# Patient Record
Sex: Male | Born: 1940 | Race: White | Hispanic: No | Marital: Married | Smoking: Former smoker
Health system: Southern US, Community
[De-identification: ages and names within clinical notes are randomized; demographics above are authoritative.]

## PROBLEM LIST (undated history)

## (undated) DIAGNOSIS — F329 Major depressive disorder, single episode, unspecified: Secondary | ICD-10-CM

## (undated) DIAGNOSIS — K5909 Other constipation: Secondary | ICD-10-CM

## (undated) DIAGNOSIS — F32A Depression, unspecified: Secondary | ICD-10-CM

## (undated) DIAGNOSIS — I10 Essential (primary) hypertension: Secondary | ICD-10-CM

## (undated) DIAGNOSIS — E119 Type 2 diabetes mellitus without complications: Secondary | ICD-10-CM

## (undated) DIAGNOSIS — G629 Polyneuropathy, unspecified: Secondary | ICD-10-CM

## (undated) DIAGNOSIS — M5090 Cervical disc disorder, unspecified, unspecified cervical region: Secondary | ICD-10-CM

## (undated) DIAGNOSIS — J869 Pyothorax without fistula: Secondary | ICD-10-CM

## (undated) DIAGNOSIS — E785 Hyperlipidemia, unspecified: Secondary | ICD-10-CM

## (undated) DIAGNOSIS — K269 Duodenal ulcer, unspecified as acute or chronic, without hemorrhage or perforation: Secondary | ICD-10-CM

## (undated) HISTORY — PX: TONSILLECTOMY: SHX5217

## (undated) HISTORY — PX: HERNIA REPAIR: SHX51

## (undated) HISTORY — DX: Duodenal ulcer, unspecified as acute or chronic, without hemorrhage or perforation: K26.9

## (undated) HISTORY — PX: SKIN LESION EXCISION: SHX2412

## (undated) HISTORY — DX: Other constipation: K59.09

## (undated) HISTORY — DX: Hyperlipidemia, unspecified: E78.5

## (undated) HISTORY — DX: Polyneuropathy, unspecified: G62.9

## (undated) HISTORY — DX: Cervical disc disorder, unspecified, unspecified cervical region: M50.90

## (undated) HISTORY — DX: Depression, unspecified: F32.A

## (undated) HISTORY — DX: Essential (primary) hypertension: I10

## (undated) HISTORY — DX: Major depressive disorder, single episode, unspecified: F32.9

## (undated) HISTORY — DX: Pyothorax without fistula: J86.9

---

## 2001-05-02 HISTORY — PX: COLONOSCOPY: SHX174

## 2001-11-15 ENCOUNTER — Encounter (HOSPITAL_COMMUNITY): Admission: RE | Admit: 2001-11-15 | Discharge: 2001-12-15 | Payer: Self-pay | Admitting: Rheumatology

## 2001-12-20 ENCOUNTER — Encounter (HOSPITAL_COMMUNITY): Admission: RE | Admit: 2001-12-20 | Discharge: 2002-01-19 | Payer: Self-pay | Admitting: Rheumatology

## 2002-03-18 ENCOUNTER — Ambulatory Visit (HOSPITAL_COMMUNITY): Admission: RE | Admit: 2002-03-18 | Discharge: 2002-03-18 | Payer: Self-pay | Admitting: Internal Medicine

## 2002-04-11 ENCOUNTER — Encounter (HOSPITAL_COMMUNITY): Admission: RE | Admit: 2002-04-11 | Discharge: 2002-05-11 | Payer: Self-pay | Admitting: Rheumatology

## 2002-04-11 ENCOUNTER — Encounter: Payer: Self-pay | Admitting: Rheumatology

## 2002-08-01 ENCOUNTER — Encounter (HOSPITAL_COMMUNITY): Admission: RE | Admit: 2002-08-01 | Discharge: 2002-08-31 | Payer: Self-pay | Admitting: Rheumatology

## 2002-11-21 ENCOUNTER — Encounter (HOSPITAL_COMMUNITY): Admission: RE | Admit: 2002-11-21 | Discharge: 2002-12-21 | Payer: Self-pay | Admitting: Rheumatology

## 2002-11-25 ENCOUNTER — Encounter: Payer: Self-pay | Admitting: Rheumatology

## 2003-02-13 ENCOUNTER — Encounter (HOSPITAL_COMMUNITY): Admission: RE | Admit: 2003-02-13 | Discharge: 2003-03-15 | Payer: Self-pay | Admitting: Rheumatology

## 2005-04-04 ENCOUNTER — Ambulatory Visit (HOSPITAL_COMMUNITY): Admission: RE | Admit: 2005-04-04 | Discharge: 2005-04-04 | Payer: Self-pay | Admitting: Ophthalmology

## 2012-05-07 ENCOUNTER — Telehealth: Payer: Self-pay

## 2012-05-07 NOTE — Telephone Encounter (Signed)
Pt had a diagnostic colonoscopy 03/18/2002 by RMR and he recommended the next one in 5-7 years. Pt is scheduled for OV with AS at 2:30 Pm on 05/21/2012.

## 2012-05-07 NOTE — Telephone Encounter (Signed)
Pt said he  has some rectal bleeding but he thinks it is coming from medications.

## 2012-05-21 ENCOUNTER — Encounter: Payer: Self-pay | Admitting: Gastroenterology

## 2012-05-21 ENCOUNTER — Ambulatory Visit (INDEPENDENT_AMBULATORY_CARE_PROVIDER_SITE_OTHER): Payer: Medicare Other | Admitting: Gastroenterology

## 2012-05-21 VITALS — BP 128/73 | HR 65 | Temp 97.8°F | Ht 72.0 in | Wt 309.4 lb

## 2012-05-21 DIAGNOSIS — Z139 Encounter for screening, unspecified: Secondary | ICD-10-CM

## 2012-05-21 MED ORDER — LUBIPROSTONE 24 MCG PO CAPS: 24.0000 ug | ORAL_CAPSULE | Freq: Two times a day (BID) | ORAL | Status: DC

## 2012-05-21 MED ORDER — PEG 3350-KCL-NA BICARB-NACL 420 G PO SOLR: 4000.0000 mL | ORAL | Status: DC

## 2012-05-21 NOTE — Assessment & Plan Note (Signed)
72 year old male due for routine screening colonoscopy with Dr. Jena Gauss. Last TCS in 2003 with anal canal hemorrhoids. Likely scant hematochezia secondary to known hemorrhoids in the setting of chronic constipation. He is currently not having quite as productive bowel movements as he would like and notes straining at times. Has been taking Miralax BID without much improvement, chronic. Chronic narcotics likely exacerbating symptoms.  Trial of Amitiza 24 mcg po daily, increase to BID.  Proceed with TCS with Dr. Jena Gauss in near future: the risks, benefits, and alternatives have been discussed with the patient in detail. The patient states understanding and desires to proceed.

## 2012-05-21 NOTE — Progress Notes (Signed)
Primary Care Physician:  Carylon Perches, MD Primary Gastroenterologist:  Dr. Jena Gauss   Chief Complaint  Patient presents with  . Colonoscopy    HPI:   Mr. Richard Fields is a pleasant 72 year old male who presents today for screening colonoscopy. The last colonoscopy was in 2003 by Dr. Jena Gauss, with only hemorrhoids noted.  Notes chronic constipation; he believes it is secondary to medications. Usually has a BM once per day. However, this is not productive. Takes Miralax twice a day, which helps some but still has straining. No melena. Scant paper hematochezia noted with straining. No rectal discomfort or pain. No abdominal pain. No nausea or vomiting. No lack of appetite. No dysphagia. Sleeps in a hospital bed "to keep from rolling off into the floor". Keeps HOB elevated at least 4-5 inches. Denies any GERD symptoms. Takes Pepcid at night.   Uses cane for gait stability. Was told by Dr. Hilda Lias he needed to get a walker due to falling. Hx of peripheral neuropathy.  Past Medical History  Diagnosis Date  . Chronic constipation   . Diabetes mellitus   . Hypertension   . Hyperlipidemia   . Peripheral neuropathy   . Cervical disc disease     Past Surgical History  Procedure Date  . Tonsillectomy   . Hernia repair   . Colonoscopy 2003    RMR: internal hemorrhoids, otherwise normal    Current Outpatient Prescriptions  Medication Sig Dispense Refill  . amitriptyline (ELAVIL) 100 MG tablet Take 100 mg by mouth at bedtime.       Marland Kitchen aspirin 81 MG tablet Take 81 mg by mouth daily.      Marland Kitchen atorvastatin (LIPITOR) 10 MG tablet Take 10 mg by mouth daily.       Marland Kitchen HYDROcodone-acetaminophen (NORCO) 7.5-325 MG per tablet Take 1 tablet by mouth every 4 (four) hours as needed.       Marland Kitchen lisinopril (PRINIVIL,ZESTRIL) 10 MG tablet Take 10 mg by mouth daily.       Marland Kitchen LORazepam (ATIVAN) 2 MG tablet Take 2 mg by mouth every 6 (six) hours as needed.      . polyethylene glycol powder (GLYCOLAX/MIRALAX) powder Take 17  g by mouth 2 (two) times daily.       . pregabalin (LYRICA) 150 MG capsule Take 150 mg by mouth 2 (two) times daily.        Allergies as of 05/21/2012 - Review Complete 05/21/2012  Allergen Reaction Noted  . Penicillins  05/21/2012  . Tegretol (carbamazepine)  05/21/2012    Family History  Problem Relation Age of Onset  . Colon cancer Paternal Uncle     History   Social History  . Marital Status: Married    Spouse Name: N/A    Number of Children: N/A  . Years of Education: N/A   Occupational History  . [employer]     retired   Social History Main Topics  . Smoking status: Never Smoker   . Smokeless tobacco: Not on file  . Alcohol Use: No  . Drug Use: No  . Sexually Active: Not on file   Other Topics Concern  . Not on file   Social History Narrative  . No narrative on file    Review of Systems: Gen: Denies any fever, chills, fatigue, weight loss, lack of appetite.  CV: Denies chest pain, heart palpitations, peripheral edema, syncope.  Resp: Denies shortness of breath at rest or with exertion. Denies wheezing or cough.  GI: Denies dysphagia  or odynophagia. Denies jaundice, hematemesis, fecal incontinence. GU : Denies urinary burning, urinary frequency, urinary hesitancy MS: +neck pain  Derm: Denies rash, itching, dry skin Psych: hx of depression Heme: Denies bruising, bleeding, and enlarged lymph nodes.  Physical Exam: BP 128/73  Pulse 65  Temp 97.8 F (36.6 C) (Oral)  Ht 6' (1.829 m)  Wt 309 lb 6.4 oz (140.343 kg)  BMI 41.96 kg/m2 General:   Alert and oriented. Pleasant and cooperative. Well-nourished and well-developed.  Head:  Normocephalic and atraumatic. Eyes:  Without icterus, sclera clear and conjunctiva pink.  Ears:  Normal auditory acuity. Nose:  No deformity, discharge,  or lesions. Mouth:  No deformity or lesions, oral mucosa pink.  Neck:  Supple, without mass or thyromegaly. Lungs:  Clear to auscultation bilaterally. No  wheezes, rales, or rhonchi. No distress.  Heart:  S1, S2 present without murmurs appreciated.  Abdomen:  LIMITED EXAM. PT COULD NOT GET ON TABLE BECAUSE HE WAS AFRAID HE WOULD NOT BE ABLE TO GET UP. +BS, soft, non-tender and non-distended. Rectal:  Deferred  Msk:  Symmetrical without gross deformities. Slight shuffle with gait, uses cane. Extremities:  1+ lower extremity edema.  Neurologic:  Alert and  oriented x4;  grossly normal neurologically. Skin:  Intact without significant lesions or rashes. Cervical Nodes:  No significant cervical adenopathy. Psych:  Alert and cooperative. Normal mood and affect.

## 2012-05-21 NOTE — Patient Instructions (Addendum)
For constipation: Stop taking the polyethylene glycol (miralax), and start taking Amitiza once each evening with your meal. Make sure you take it with food to avoid nausea. Take this once a day for about 3 days, then increase to twice a day if you tolerate it well. If you start having diarrhea, decrease the dosage and call our office.  We have set you up for a colonoscopy with Dr. Jena Gauss in the near future.

## 2012-05-22 NOTE — Progress Notes (Signed)
Faxed to PCP

## 2012-05-29 ENCOUNTER — Encounter (HOSPITAL_COMMUNITY): Payer: Self-pay | Admitting: Pharmacy Technician

## 2012-05-30 ENCOUNTER — Ambulatory Visit (HOSPITAL_COMMUNITY)
Admission: RE | Admit: 2012-05-30 | Discharge: 2012-05-30 | Disposition: A | Discharge status: Home / Self Care | Payer: Medicare Other | Source: Ambulatory Visit | Attending: Internal Medicine | Admitting: Internal Medicine

## 2012-05-30 ENCOUNTER — Encounter (HOSPITAL_COMMUNITY)
Admission: RE | Disposition: A | Discharge status: Home / Self Care | Payer: Self-pay | Source: Ambulatory Visit | Attending: Internal Medicine

## 2012-05-30 ENCOUNTER — Encounter (HOSPITAL_COMMUNITY): Payer: Self-pay | Admitting: *Deleted

## 2012-05-30 DIAGNOSIS — Z139 Encounter for screening, unspecified: Secondary | ICD-10-CM

## 2012-05-30 DIAGNOSIS — E119 Type 2 diabetes mellitus without complications: Secondary | ICD-10-CM | POA: Insufficient documentation

## 2012-05-30 DIAGNOSIS — D126 Benign neoplasm of colon, unspecified: Secondary | ICD-10-CM | POA: Insufficient documentation

## 2012-05-30 DIAGNOSIS — Z1211 Encounter for screening for malignant neoplasm of colon: Secondary | ICD-10-CM | POA: Insufficient documentation

## 2012-05-30 DIAGNOSIS — I10 Essential (primary) hypertension: Secondary | ICD-10-CM | POA: Insufficient documentation

## 2012-05-30 DIAGNOSIS — K648 Other hemorrhoids: Secondary | ICD-10-CM

## 2012-05-30 DIAGNOSIS — Z01812 Encounter for preprocedural laboratory examination: Secondary | ICD-10-CM | POA: Insufficient documentation

## 2012-05-30 HISTORY — DX: Type 2 diabetes mellitus without complications: E11.9

## 2012-05-30 HISTORY — PX: COLONOSCOPY: SHX5424

## 2012-05-30 SURGERY — COLONOSCOPY
Anesthesia: Moderate Sedation

## 2012-05-30 MED ORDER — MIDAZOLAM HCL 5 MG/5ML IJ SOLN
INTRAMUSCULAR | Status: AC
  Filled 2012-05-30: qty 10

## 2012-05-30 MED ORDER — MEPERIDINE HCL 100 MG/ML IJ SOLN
INTRAMUSCULAR | Status: AC
  Filled 2012-05-30: qty 2

## 2012-05-30 MED ORDER — MEPERIDINE HCL 100 MG/ML IJ SOLN
INTRAMUSCULAR | Status: DC | PRN
  Administered 2012-05-30: 50 mg via INTRAVENOUS
  Administered 2012-05-30: 25 mg via INTRAVENOUS

## 2012-05-30 MED ORDER — STERILE WATER FOR IRRIGATION IR SOLN
Status: DC | PRN
  Administered 2012-05-30: 12:00:00

## 2012-05-30 MED ORDER — SODIUM CHLORIDE 0.45 % IV SOLN
INTRAVENOUS | Status: DC
  Administered 2012-05-30: 12:00:00 via INTRAVENOUS

## 2012-05-30 MED ORDER — MIDAZOLAM HCL 5 MG/5ML IJ SOLN
INTRAMUSCULAR | Status: DC | PRN
  Administered 2012-05-30 (×2): 1 mg via INTRAVENOUS
  Administered 2012-05-30: 2 mg via INTRAVENOUS
  Administered 2012-05-30: 1 mg via INTRAVENOUS
  Administered 2012-05-30: 2 mg via INTRAVENOUS
  Administered 2012-05-30: 1 mg via INTRAVENOUS

## 2012-05-30 MED ORDER — ONDANSETRON HCL 4 MG/2ML IJ SOLN
INTRAMUSCULAR | Status: DC | PRN
  Administered 2012-05-30: 4 mg via INTRAVENOUS

## 2012-05-30 MED ORDER — ONDANSETRON HCL 4 MG/2ML IJ SOLN
INTRAMUSCULAR | Status: AC
  Filled 2012-05-30: qty 2

## 2012-05-30 NOTE — H&P (View-Only) (Signed)
 Primary Care Physician:  FAGAN,ROY, MD Primary Gastroenterologist:  Dr. Rourk   Chief Complaint  Patient presents with  . Colonoscopy    HPI:   Richard Fields is a pleasant 72-year-old male who presents today for screening colonoscopy. The last colonoscopy was in 2003 by Dr. Rourk, with only hemorrhoids noted.  Notes chronic constipation; he believes it is secondary to medications. Usually has a BM once per day. However, this is not productive. Takes Miralax twice a day, which helps some but still has straining. No melena. Scant paper hematochezia noted with straining. No rectal discomfort or pain. No abdominal pain. No nausea or vomiting. No lack of appetite. No dysphagia. Sleeps in a hospital bed "to keep from rolling off into the floor". Keeps HOB elevated at least 4-5 inches. Denies any GERD symptoms. Takes Pepcid at night.   Uses cane for gait stability. Was told by Dr. Keeling he needed to get a walker due to falling. Hx of peripheral neuropathy.  Past Medical History  Diagnosis Date  . Chronic constipation   . Diabetes mellitus   . Hypertension   . Hyperlipidemia   . Peripheral neuropathy   . Cervical disc disease     Past Surgical History  Procedure Date  . Tonsillectomy   . Hernia repair   . Colonoscopy 2003    RMR: internal hemorrhoids, otherwise normal    Current Outpatient Prescriptions  Medication Sig Dispense Refill  . amitriptyline (ELAVIL) 100 MG tablet Take 100 mg by mouth at bedtime.       . aspirin 81 MG tablet Take 81 mg by mouth daily.      . atorvastatin (LIPITOR) 10 MG tablet Take 10 mg by mouth daily.       . HYDROcodone-acetaminophen (NORCO) 7.5-325 MG per tablet Take 1 tablet by mouth every 4 (four) hours as needed.       . lisinopril (PRINIVIL,ZESTRIL) 10 MG tablet Take 10 mg by mouth daily.       . LORazepam (ATIVAN) 2 MG tablet Take 2 mg by mouth every 6 (six) hours as needed.      . polyethylene glycol powder (GLYCOLAX/MIRALAX) powder Take 17  g by mouth 2 (two) times daily.       . pregabalin (LYRICA) 150 MG capsule Take 150 mg by mouth 2 (two) times daily.        Allergies as of 05/21/2012 - Review Complete 05/21/2012  Allergen Reaction Noted  . Penicillins  05/21/2012  . Tegretol (carbamazepine)  05/21/2012    Family History  Problem Relation Age of Onset  . Colon cancer Paternal Uncle     History   Social History  . Marital Status: Married    Spouse Name: N/A    Number of Children: N/A  . Years of Education: N/A   Occupational History  . [employer]     retired   Social History Main Topics  . Smoking status: Never Smoker   . Smokeless tobacco: Not on file  . Alcohol Use: No  . Drug Use: No  . Sexually Active: Not on file   Other Topics Concern  . Not on file   Social History Narrative  . No narrative on file    Review of Systems: Gen: Denies any fever, chills, fatigue, weight loss, lack of appetite.  CV: Denies chest pain, heart palpitations, peripheral edema, syncope.  Resp: Denies shortness of breath at rest or with exertion. Denies wheezing or cough.  GI: Denies dysphagia   or odynophagia. Denies jaundice, hematemesis, fecal incontinence. GU : Denies urinary burning, urinary frequency, urinary hesitancy MS: +neck pain  Derm: Denies rash, itching, dry skin Psych: hx of depression Heme: Denies bruising, bleeding, and enlarged lymph nodes.  Physical Exam: BP 128/73  Pulse 65  Temp 97.8 F (36.6 C) (Oral)  Ht 6' (1.829 m)  Wt 309 lb 6.4 oz (140.343 kg)  BMI 41.96 kg/m2 General:   Alert and oriented. Pleasant and cooperative. Well-nourished and well-developed.  Head:  Normocephalic and atraumatic. Eyes:  Without icterus, sclera clear and conjunctiva pink.  Ears:  Normal auditory acuity. Nose:  No deformity, discharge,  or lesions. Mouth:  No deformity or lesions, oral mucosa pink.  Neck:  Supple, without mass or thyromegaly. Lungs:  Clear to auscultation bilaterally. No  wheezes, rales, or rhonchi. No distress.  Heart:  S1, S2 present without murmurs appreciated.  Abdomen:  LIMITED EXAM. PT COULD NOT GET ON TABLE BECAUSE HE WAS AFRAID HE WOULD NOT BE ABLE TO GET UP. +BS, soft, non-tender and non-distended. Rectal:  Deferred  Msk:  Symmetrical without gross deformities. Slight shuffle with gait, uses cane. Extremities:  1+ lower extremity edema.  Neurologic:  Alert and  oriented x4;  grossly normal neurologically. Skin:  Intact without significant lesions or rashes. Cervical Nodes:  No significant cervical adenopathy. Psych:  Alert and cooperative. Normal mood and affect.    

## 2012-05-30 NOTE — Op Note (Signed)
[facility] [address] [place], [address]   COLONOSCOPY PROCEDURE REPORT  PATIENT: Richard Fields, Richard Fields  MR#:         [id number] BIRTHDATE: 10-Jan-1941 , 71  yrs. old GENDER: Male ENDOSCOPIST: R.  Roetta Sessions, MD FACP FACG REFERRED BY:  Carylon Perches, M.D. PROCEDURE DATE:  05/30/2012 PROCEDURE:    Colonoscopy with snare polypectomy   INDICATIONS: Average risk colorectal screening examination.  INFORMED CONSENT:  The risks, benefits, alternatives and imponderables including but not limited to bleeding, perforation as well as the possibility of a missed lesion have been reviewed.  The potential for biopsy, lesion removal, etc. have also been discussed.  Questions have been answered.  All parties agreeable. Please see the history and physical in the medical record for more information.  MEDICATIONS: Versed 8 mg IV and Demerol 75 mg IV in divided doses. Zofran 4 mg  DESCRIPTION OF PROCEDURE:  After a digital rectal exam was performed, the Pentax Colonoscope [id number]  colonoscope was advanced from the anus through the rectum and colon to the area of the cecum, ileocecal valve and appendiceal orifice.  The cecum was deeply intubated.  These structures were well-seen and photographed for the record.  From the level of the cecum and ileocecal valve, the scope was slowly and cautiously withdrawn.  The mucosal surfaces were carefully surveyed utilizing scope tip deflection to facilitate fold flattening as needed.  The scope was pulled down into the rectum where a thorough examination was performed.    FINDINGS:  Adequate preparation. Rectal vault small-unable to retroflex but seen well on-face. Rectal mucosa appeared normal aside from internal hemorrhoids and a single anal papilla..  (1) 4 mm polyp in the mid descending segment; otherwise, the remainder of colonic mucosa appeared normal. The patient's colon was redundant requiring a number of maneuvers including  changing of the patient's position and external abdominal pressure to reach the cecum.  THERAPEUTIC / DIAGNOSTIC MANEUVERS PERFORMED:  The above-mentioned polyp was cold snared/removed.  COMPLICATIONS: none  CECAL WITHDRAWAL TIME:  8 minutes  IMPRESSION:  Redundant colon. Single colonic polyp-removed as described above. Internal hemorrhoids.  RECOMMENDATIONS: Followup on pathology.   _______________________________ eSigned:  R. Roetta Sessions, MD FACP [facility] 05/30/2012 1:23 PM   CC:

## 2012-05-30 NOTE — Interval H&P Note (Signed)
History and Physical Interval Note:  05/30/2012 12:18 PM  Richard Fields  has presented today for surgery, with the diagnosis of SCREENING COLONOSCOPY  The various methods of treatment have been discussed with the patient and family. After consideration of risks, benefits and other options for treatment, the patient has consented to  Procedure(s) (LRB) with comments: COLONOSCOPY (N/A) - 12:00 as a surgical intervention .  The patient's history has been reviewed, patient examined, no change in status, stable for surgery.  I have reviewed the patient's chart and labs.  Questions were answered to the patient's satisfaction.     Eula Listen  As above.The risks, benefits, limitations, alternatives and imponderables have been reviewed with the patient. Questions have been answered. All parties are agreeable.

## 2012-06-01 ENCOUNTER — Encounter (HOSPITAL_COMMUNITY): Payer: Self-pay | Admitting: Internal Medicine

## 2012-06-04 ENCOUNTER — Encounter: Payer: Self-pay | Admitting: Internal Medicine

## 2012-06-04 ENCOUNTER — Encounter: Payer: Self-pay | Admitting: *Deleted

## 2013-03-11 ENCOUNTER — Other Ambulatory Visit (HOSPITAL_COMMUNITY): Payer: Self-pay | Admitting: Internal Medicine

## 2013-03-11 DIAGNOSIS — R479 Unspecified speech disturbances: Secondary | ICD-10-CM

## 2013-03-11 DIAGNOSIS — W19XXXA Unspecified fall, initial encounter: Secondary | ICD-10-CM

## 2013-03-12 ENCOUNTER — Ambulatory Visit (HOSPITAL_COMMUNITY)
Admission: RE | Admit: 2013-03-12 | Discharge: 2013-03-12 | Disposition: A | Discharge status: Home / Self Care | Payer: PRIVATE HEALTH INSURANCE | Source: Ambulatory Visit | Attending: Internal Medicine | Admitting: Internal Medicine

## 2013-03-12 DIAGNOSIS — R4789 Other speech disturbances: Secondary | ICD-10-CM | POA: Insufficient documentation

## 2013-03-12 DIAGNOSIS — I6789 Other cerebrovascular disease: Secondary | ICD-10-CM | POA: Insufficient documentation

## 2013-03-12 DIAGNOSIS — W19XXXA Unspecified fall, initial encounter: Secondary | ICD-10-CM

## 2013-03-12 DIAGNOSIS — R262 Difficulty in walking, not elsewhere classified: Secondary | ICD-10-CM | POA: Insufficient documentation

## 2013-03-12 DIAGNOSIS — G319 Degenerative disease of nervous system, unspecified: Secondary | ICD-10-CM | POA: Insufficient documentation

## 2013-03-12 DIAGNOSIS — R479 Unspecified speech disturbances: Secondary | ICD-10-CM

## 2013-03-12 DIAGNOSIS — R51 Headache: Secondary | ICD-10-CM | POA: Insufficient documentation

## 2013-09-10 ENCOUNTER — Ambulatory Visit (INDEPENDENT_AMBULATORY_CARE_PROVIDER_SITE_OTHER): Payer: Medicare Other | Admitting: Neurology

## 2013-09-10 ENCOUNTER — Encounter (INDEPENDENT_AMBULATORY_CARE_PROVIDER_SITE_OTHER): Payer: Self-pay

## 2013-09-10 ENCOUNTER — Encounter: Payer: Self-pay | Admitting: Neurology

## 2013-09-10 VITALS — BP 111/73 | HR 75 | Temp 97.7°F | Ht 72.0 in

## 2013-09-10 DIAGNOSIS — G609 Hereditary and idiopathic neuropathy, unspecified: Secondary | ICD-10-CM

## 2013-09-10 DIAGNOSIS — R269 Unspecified abnormalities of gait and mobility: Secondary | ICD-10-CM

## 2013-09-10 DIAGNOSIS — Z9181 History of falling: Secondary | ICD-10-CM

## 2013-09-10 DIAGNOSIS — E669 Obesity, unspecified: Secondary | ICD-10-CM

## 2013-09-10 DIAGNOSIS — R296 Repeated falls: Secondary | ICD-10-CM

## 2013-09-10 DIAGNOSIS — R2689 Other abnormalities of gait and mobility: Secondary | ICD-10-CM

## 2013-09-10 NOTE — Patient Instructions (Addendum)
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   Remember to drink plenty of fluid, eat healthy meals and do not skip any meals. Try to eat protein with a every meal and eat a healthy snack such as fruit or nuts in between meals. Change positions slowly and you should start using a cane.   As far as your medications are concerned, I would like to suggest no new medications, in fact, you may want to talk to Dr. Willey Blade about reducing some of your medications. He can consider physical therapy, once your cast can come off. You may do better with a 2 wheeled walker.   Unfortunately, there is probably not a whole lot I can do from the neurological standpoint.

## 2013-09-10 NOTE — Progress Notes (Signed)
Subjective:    Patient ID: Richard Fields is a 73 y.o. male.  HPI    Star Age, MD, PhD [facility] [address].O[address] [place], [place] [address]  Dear Dr. Willey Blade,   I saw your patient, Richard Fields, upon your kind request in my neurologic clinic today for initial consultation of his balance problems and history of falls. The patient is accompanied by his wife today. As you know, Richard Fields is a 73 year old right-handed gentleman with an underlying medical history of obesity, arthritis, affecting both knees, cervical disc disease for which he is on as needed use of hydrocodone, diabetes (not on meds, last A1c of 6.6), hyperlipidemia, hypertension, history of neuropathy for which he is on Lyrica 150 mg twice daily and amitriptyline 100 mg each night, and insomnia, for which he is on Ativan 2 mg each night, who reports balance issues and recurrent falls for years, worse in the last 2-3 months. He had a brain MRI without contrast on 03/12/2013: Moderate atrophy with mild to moderate chronic microvascular ischemic change. No acute stroke or visible hemorrhage. 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 No significant compression of the underlying brain. In addition, personally reviewed the images through the PACS system. He does not have a history of heavy alcohol use and quit smoking in the 80s. He has had concussions before, playing football when he was young.  He has been using a walker for years, but fell recently about 2 weeks ago and sustained a fracture of his right 5th metacarpal. He has not had a syncope or vertigo symptoms. He is currently in a WC. He has a cast on his R hand/wrist. He does not snore and has no apneas per wife.  He was seen for his neuropathy in our office years ago, presumably by Dr. Brett Fairy.   His Past Medical History Is Significant For: Past  Medical History  Diagnosis Date  . Chronic constipation   . Hypertension   . Hyperlipidemia   . Peripheral neuropathy   . Cervical disc disease   . Diabetes mellitus without complication     Borderline diabetic-diet controlled    His Past Surgical History Is Significant For: Past Surgical History  Procedure Laterality Date  . Tonsillectomy    . Hernia repair    . Colonoscopy  2003    RMR: internal hemorrhoids, otherwise normal  . Colonoscopy  05/30/2012    Procedure: COLONOSCOPY;  Surgeon: Daneil Dolin, MD;  [facility];  Service: Endoscopy;  Laterality: N/A;  12:00    His Family History Is Significant For: Family History  Problem Relation Age of Onset  . Colon cancer Paternal Uncle   . Diabetes Brother   . Cancer Father     His Social History Is Significant For: History   Social History  . Marital Status: Married    Spouse Name: N/A    Number of Children: N/A  . Years of Education: N/A   Occupational History  . [employer]     retired   Social History Main Topics  . Smoking status: Never Smoker   . Smokeless tobacco: None  . Alcohol Use: No  . Drug Use: No  . Sexual Activity: None   Other Topics Concern  . None   Social History Narrative  . None    His Allergies Are:  Allergies  Allergen Reactions  . Penicillins   .  Tegretol [Carbamazepine]   :   His Current Medications Are:  Outpatient Encounter Prescriptions as of 09/10/2013  Medication Sig  . AMITIZA 24 MCG capsule Take 2 capsules by mouth daily.  Marland Kitchen amitriptyline (ELAVIL) 100 MG tablet Take 100 mg by mouth at bedtime.   Marland Kitchen aspirin 325 MG tablet Take 325 mg by mouth daily.  Marland Kitchen atorvastatin (LIPITOR) 10 MG tablet Take 10 mg by mouth daily.   . clobetasol ointment (TEMOVATE) [id number] % Apply 1 application topically as needed.  . fluticasone (CUTIVATE) 0.05 % cream Apply 1 application topically as needed.  . furosemide (LASIX) 40 MG tablet Take 1 tablet by mouth daily.  Marland Kitchen  HYDROcodone-acetaminophen (NORCO) 7.5-325 MG per tablet Take 1 tablet by mouth every 4 (four) hours as needed. Pain, takes mostly at bedtime  . ketoconazole (NIZORAL) 2 % cream Apply 1 application topically as needed.  Marland Kitchen lisinopril (PRINIVIL,ZESTRIL) 10 MG tablet Take 10 mg by mouth daily.   Marland Kitchen LORazepam (ATIVAN) 2 MG tablet Take 2 mg by mouth at bedtime.  . polyethylene glycol powder (GLYCOLAX/MIRALAX) powder Take 17 g by mouth 2 (two) times daily.   . polyethylene glycol-electrolytes (TRILYTE) 420 G solution Take 4,000 mLs by mouth as directed.  . potassium chloride SA (K-DUR,KLOR-CON) 20 MEQ tablet Take 0.5 tablets by mouth daily.  . pregabalin (LYRICA) 150 MG capsule Take 150 mg by mouth 2 (two) times daily.  :   Review of Systems:  Out of a complete 14 point review of systems, all are reviewed and negative with the exception of these symptoms as listed below:  Review of Systems  Constitutional: Positive for fatigue.  Eyes: Negative.   Respiratory: Negative.   Cardiovascular: Positive for leg swelling.  Gastrointestinal: Positive for constipation.  Endocrine: Positive for polydipsia.  Genitourinary: Negative.   Musculoskeletal: Positive for gait problem.  Skin: Negative.   Allergic/Immunologic: Negative.   Neurological: Positive for headaches.  Hematological: Bruises/bleeds easily.  Psychiatric/Behavioral: Negative.     Objective:  Neurologic Exam  Physical Exam Physical Examination:   Filed Vitals:   09/10/13 1331  BP: 111/73  Pulse: 75  Temp: 97.7 F (36.5 C)    General Examination: The patient is a very pleasant 73 y.o. male in no acute distress. He appears frail and mildly deconditioned and adequately groomed. He is obese and situated in his wheelchair. He brought in his 2 wheeled walker which has a pistol-grip on the right to help him hold onto with his fist on the R.   HEENT: Normocephalic, atraumatic, pupils are equal, round and reactive to light and  accommodation. Extraocular tracking is good without limitation to gaze excursion or nystagmus noted. Normal smooth pursuit is noted. Hearing is grossly intact. Face is symmetric with normal facial animation and normal facial sensation. Speech is clear with no dysarthria noted. There is no hypophonia. There is no lip, neck/head, jaw or voice tremor. Neck is supple with full range of passive and active motion. There are no carotid bruits on auscultation. Oropharynx exam reveals: severe mouth dryness, poor dental hygiene and several missing teeth and mild airway crowding. Mallampati is class I. Tongue protrudes centrally and palate elevates symmetrically. Tonsils are absent.   Chest: Clear to auscultation without wheezing, rhonchi or crackles noted.  Heart: S1+S2+0, regular and normal without murmurs, rubs or gallops noted.   Abdomen: Soft, non-tender and non-distended with normal bowel sounds appreciated on auscultation.  Extremities: There is 2+ pitting edema in the distal lower extremities bilaterally. Pedal pulses are  intact.  Skin: Warm and dry without trophic changes noted. There are no varicose veins. Chronic venous stasis like changes and discoloration is seen in the distal LEs.   Musculoskeletal: exam reveals no obvious joint deformities, tenderness or joint swelling or erythema.   Neurologically:  Mental status: The patient is awake, alert and oriented in all 4 spheres. His immediate and remote memory, attention, language skills and fund of knowledge are appropriate. There is no evidence of aphasia, agnosia, apraxia or anomia. Speech is clear with normal prosody and enunciation. Thought process is linear. Mood is normal and affect is normal.  Cranial nerves II - XII are as described above under HEENT exam. In addition: shoulder shrug is normal with equal shoulder height noted. Motor exam: Normal bulk, strength and tone is noted. There is no drift, tremor or rebound. Romberg is negative.  Reflexes are 2+ throughout. Fine motor skills and coordination: intact with normal finger taps, normal hand movements, normal rapid alternating patting, normal foot taps and normal foot agility.  Cerebellar testing: No dysmetria or intention tremor on finger to nose testing. Heel to shin is difficult for him, as he has trouble reaching his knees with his heels b/l. There is no truncal gait ataxia.  Sensory exam: intact to light touch, pinprick, vibration, temperature sense in the upper extremities and decreased to all modalities in the LEs up to below knees.   Gait, station and balance: He stands with difficulty and needs to push himself up and needs mild assistance.  No veering to one side is noted. No leaning to one side is noted. Posture is age-appropriate but stance is wide-based. Gait shows slow and cautious gait and he is not pushing his knees all the way through. He uses his 2 wheeled walker with grip fairly well. He turns in 3 steps. Tandem walk is not possible and toe and heel stance is not possible.                Assessment and Plan:   In summary, Richard Fields is a very pleasant 73 y.o.-year old male with an underlying medical history of obesity, arthritis, affecting both knees, cervical disc disease for which he is on as needed use of hydrocodone, diabetes (not on meds, last A1c of 6.6), hyperlipidemia, hypertension, history of neuropathy for which he is on Lyrica 150 mg twice daily and amitriptyline 100 mg each night, and insomnia, for which he is on Ativan 2 mg each night, who reports balance issues and recurrent falls for years, worse in the last 2-3 months. 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 I explained my findings and my concerns to him. I would recommend consideration of physical therapy for gait and balance training once he is able to get his cast removed. He is advised to discuss with you streamlining his medications and perhaps reducing some of his sedating medications. He is advised to strive for weight loss. Unfortunately, overall, from a neurological standpoint there is probably not much I can do for him because of the complex nature of his gait disorder and I don't believe that just one underlying problem and a primary neurological problem is the issue at this  time. He is advised to change positions slowly and keep well hydrated. His mouth is quite dry. This may be a function of not drinking enough fluids but also side effects from primarily the amitriptyline.  I answered all their questions today and the patient and his wife were in agreement with the above outlined plan. I will see him back as needed.  Thank you very much for allowing me to participate in the care of this nice patient. If I can be of any further assistance to you please do not hesitate to call me at [phone].  Sincerely,   Star Age, MD, PhD

## 2014-08-20 ENCOUNTER — Emergency Department (HOSPITAL_COMMUNITY): Payer: Medicare Other

## 2014-08-20 ENCOUNTER — Encounter (HOSPITAL_COMMUNITY): Payer: Self-pay | Admitting: *Deleted

## 2014-08-20 ENCOUNTER — Emergency Department (HOSPITAL_COMMUNITY)
Admission: EM | Admit: 2014-08-20 | Discharge: 2014-08-20 | Disposition: A | Discharge status: Home / Self Care | Payer: Medicare Other | Attending: Emergency Medicine | Admitting: Emergency Medicine

## 2014-08-20 DIAGNOSIS — R531 Weakness: Secondary | ICD-10-CM | POA: Diagnosis not present

## 2014-08-20 DIAGNOSIS — M791 Myalgia: Secondary | ICD-10-CM | POA: Insufficient documentation

## 2014-08-20 DIAGNOSIS — R197 Diarrhea, unspecified: Secondary | ICD-10-CM | POA: Diagnosis not present

## 2014-08-20 DIAGNOSIS — F32A Depression, unspecified: Secondary | ICD-10-CM

## 2014-08-20 DIAGNOSIS — E785 Hyperlipidemia, unspecified: Secondary | ICD-10-CM | POA: Diagnosis not present

## 2014-08-20 DIAGNOSIS — Z7982 Long term (current) use of aspirin: Secondary | ICD-10-CM | POA: Diagnosis not present

## 2014-08-20 DIAGNOSIS — I10 Essential (primary) hypertension: Secondary | ICD-10-CM | POA: Diagnosis not present

## 2014-08-20 DIAGNOSIS — E119 Type 2 diabetes mellitus without complications: Secondary | ICD-10-CM | POA: Diagnosis not present

## 2014-08-20 DIAGNOSIS — F329 Major depressive disorder, single episode, unspecified: Secondary | ICD-10-CM | POA: Diagnosis not present

## 2014-08-20 DIAGNOSIS — K5900 Constipation, unspecified: Secondary | ICD-10-CM | POA: Diagnosis not present

## 2014-08-20 DIAGNOSIS — G629 Polyneuropathy, unspecified: Secondary | ICD-10-CM | POA: Insufficient documentation

## 2014-08-20 DIAGNOSIS — Z880 Allergy status to penicillin: Secondary | ICD-10-CM | POA: Diagnosis not present

## 2014-08-20 LAB — URINALYSIS, ROUTINE W REFLEX MICROSCOPIC
BILIRUBIN URINE: NEGATIVE
Glucose, UA: NEGATIVE mg/dL
HGB URINE DIPSTICK: NEGATIVE
LEUKOCYTES UA: NEGATIVE
NITRITE: NEGATIVE
PH: 6.5 (ref 5.0–8.0)
Protein, ur: NEGATIVE mg/dL
Specific Gravity, Urine: 1.015 (ref 1.005–1.030)
Urobilinogen, UA: 0.2 mg/dL (ref 0.0–1.0)

## 2014-08-20 LAB — BASIC METABOLIC PANEL
Anion gap: 9 (ref 5–15)
BUN: 12 mg/dL (ref 6–23)
CHLORIDE: 100 mmol/L (ref 96–112)
CO2: 30 mmol/L (ref 19–32)
CREATININE: 0.72 mg/dL (ref 0.50–1.35)
Calcium: 8.8 mg/dL (ref 8.4–10.5)
GFR calc Af Amer: >90 mL/min (ref >90)
GFR calc non Af Amer: 89 mL/min — ABNORMAL LOW (ref >90)
Glucose, Bld: 115 mg/dL — ABNORMAL HIGH (ref 70–99)
Potassium: 3 mmol/L — ABNORMAL LOW (ref 3.5–5.1)
Sodium: 139 mmol/L (ref 135–145)

## 2014-08-20 LAB — CBC
HCT: 43.4 % (ref 39.0–52.0)
Hemoglobin: 14.8 g/dL (ref 13.0–17.0)
MCH: 31.7 pg (ref 26.0–34.0)
MCHC: 34.1 g/dL (ref 30.0–36.0)
MCV: 92.9 fL (ref 78.0–100.0)
PLATELETS: 227 10*3/uL (ref 150–400)
RBC: 4.67 MIL/uL (ref 4.22–5.81)
RDW: 12.5 % (ref 11.5–15.5)
WBC: 8.2 10*3/uL (ref 4.0–10.5)

## 2014-08-20 LAB — HEPATIC FUNCTION PANEL
ALK PHOS: 72 U/L (ref 39–117)
ALT: 17 U/L (ref 0–53)
AST: 18 U/L (ref 0–37)
Albumin: 3.7 g/dL (ref 3.5–5.2)
BILIRUBIN INDIRECT: 0.7 mg/dL (ref 0.3–0.9)
Bilirubin, Direct: 0.2 mg/dL (ref 0.0–0.5)
TOTAL PROTEIN: 7.1 g/dL (ref 6.0–8.3)
Total Bilirubin: 0.9 mg/dL (ref 0.3–1.2)

## 2014-08-20 LAB — I-STAT CG4 LACTIC ACID, ED
LACTIC ACID, VENOUS: 1.51 mmol/L (ref 0.5–2.0)
Lactic Acid, Venous: 1.14 mmol/L (ref 0.5–2.0)

## 2014-08-20 LAB — TSH: TSH: 0.262 u[IU]/mL — ABNORMAL LOW (ref 0.350–4.500)

## 2014-08-20 MED ORDER — SODIUM CHLORIDE 0.9 % IV BOLUS (SEPSIS): 1000.0000 mL | Freq: Once | INTRAVENOUS | Status: DC

## 2014-08-20 NOTE — ED Notes (Signed)
[facility] will transport patient home. Awaiting their arrival.

## 2014-08-20 NOTE — ED Notes (Signed)
Pt sates he is unable to provide urine sample at this time.

## 2014-08-20 NOTE — ED Notes (Signed)
Spoke with patient's wife who stated she "did not want to come get patient, because I have the cellulitis". Looking into transport for patient

## 2014-08-20 NOTE — ED Notes (Signed)
Pt brought in by [facility] for c/o feeling weak and not feeling well x 2 weeks; pt has been having diarrhea with no abdominal pain or fever and states he has not been able to sleep last few nights

## 2014-08-20 NOTE — ED Provider Notes (Signed)
CSN: [id number]     Arrival date & time 08/20/14  1740 History  This chart was scribed for Davonna Belling, MD by Mercy Moore, ED scribe.  This patient was seen in room [facility] and the patient's care was started at 9:05 AM.   Chief Complaint  Patient presents with  . Weakness   The history is provided by the patient. No language interpreter was used.   HPI Comments: Richard Fields is a 74 y.o. male brought in by ambulance, who presents to the Emergency Department complaining of weakness, fatigue and diarrhea ongoing for two weeks now. Patient describes watery, loose diarrhea, decreased appetite and presumable weight loss. Patient denies hematochezia, melena, nausea, vomiting, or urinary symptoms. Patient denies sick contacts, recent antibiotics or hospital visits. Patient denies specific complaints of pain but reports feeling of depression. Patient shares history of well controlled depression exacerbated over the past few weeks. Patient denies suicidal ideation.   Past Medical History  Diagnosis Date  . Chronic constipation   . Hypertension   . Hyperlipidemia   . Peripheral neuropathy   . Cervical disc disease   . Diabetes mellitus without complication     Borderline diabetic-diet controlled   Past Surgical History  Procedure Laterality Date  . Tonsillectomy    . Hernia repair    . Colonoscopy  2003    RMR: internal hemorrhoids, otherwise normal  . Colonoscopy  05/30/2012    Procedure: COLONOSCOPY;  Surgeon: Daneil Dolin, MD;  [facility];  Service: Endoscopy;  Laterality: N/A;  12:00   Family History  Problem Relation Age of Onset  . Colon cancer Paternal Uncle   . Diabetes Brother   . Cancer Father    History  Substance Use Topics  . Smoking status: Never Smoker   . Smokeless tobacco: Not on file  . Alcohol Use: No    Review of Systems  Constitutional: Positive for fatigue. Negative for fever and chills.  HENT: Negative for congestion and sore  throat.   Eyes: Negative for pain.  Respiratory: Negative for cough, shortness of breath and wheezing.   Cardiovascular: Negative for chest pain.  Gastrointestinal: Positive for diarrhea. Negative for nausea, vomiting, abdominal pain and blood in stool.  Genitourinary: Negative for dysuria and hematuria.  Musculoskeletal: Positive for myalgias. Negative for neck stiffness.  Skin: Negative for rash.  Allergic/Immunologic: Negative for immunocompromised state.  Neurological: Negative for headaches.  Hematological: Negative for adenopathy.  Psychiatric/Behavioral: Positive for dysphoric mood. Negative for suicidal ideas.      Allergies  Penicillins and Tegretol  Home Medications   Prior to Admission medications   Medication Sig Start Date End Date Taking? Authorizing Provider  AMITIZA 24 MCG capsule Take 2 capsules by mouth daily. 08/14/13  Yes Historical Provider, MD  aspirin 325 MG tablet Take 325 mg by mouth daily.   Yes Historical Provider, MD  atorvastatin (LIPITOR) 10 MG tablet Take 10 mg by mouth daily.  05/10/12  Yes Historical Provider, MD  clobetasol ointment (TEMOVATE) 8.14 % Apply 1 application topically as needed. 07/20/13  Yes Historical Provider, MD  fluticasone (CUTIVATE) 0.05 % cream Apply 1 application topically as needed. 07/20/13  Yes Historical Provider, MD  furosemide (LASIX) 40 MG tablet Take 1 tablet by mouth daily. 08/26/13  Yes Historical Provider, MD  HYDROcodone-acetaminophen (NORCO) 7.5-325 MG per tablet Take 1 tablet by mouth every 4 (four) hours as needed. Pain, takes mostly at bedtime 05/10/12  Yes Historical Provider, MD  ketoconazole (NIZORAL) 2 %  cream Apply 1 application topically as needed. 07/26/13  Yes Historical Provider, MD  lisinopril (PRINIVIL,ZESTRIL) 10 MG tablet Take 10 mg by mouth daily.  05/10/12  Yes Historical Provider, MD  LORazepam (ATIVAN) 2 MG tablet Take 2 mg by mouth at bedtime.   Yes Historical Provider, MD  mirtazapine (REMERON) 15 MG  tablet Take 7.5 mg by mouth at bedtime. 08/18/14  Yes Historical Provider, MD  polyethylene glycol powder (GLYCOLAX/MIRALAX) powder Take 17 g by mouth 2 (two) times daily.  05/04/12  Yes Historical Provider, MD  potassium chloride SA (K-DUR,KLOR-CON) 20 MEQ tablet Take 0.5 tablets by mouth daily. 08/26/13  Yes Historical Provider, MD  pregabalin (LYRICA) 150 MG capsule Take 150 mg by mouth 2 (two) times daily.   Yes Historical Provider, MD  traZODone (DESYREL) 50 MG tablet Take 1 tablet by mouth at bedtime. 07/28/14  Yes Historical Provider, MD  meloxicam (MOBIC) 7.5 MG tablet Take 1 tablet by mouth daily. 07/28/14   Historical Provider, MD  polyethylene glycol-electrolytes (TRILYTE) 420 G solution Take 4,000 mLs by mouth as directed. Patient not taking: Reported on 08/20/2014 05/21/12   Daneil Dolin, MD   Triage Vitals: BP 118/70 mmHg  Pulse 61  Temp(Src) 98.2 F (36.8 C) (Oral)  Resp 20  Ht 6' (1.829 m)  Wt 285 lb (129.275 kg)  BMI 38.64 kg/m2  SpO2 97% Physical Exam  Constitutional: He appears well-developed and well-nourished. No distress.  HENT:  Head: Normocephalic and atraumatic.  Dry mucous membranes  Neck: Neck supple.  Cardiovascular: Normal rate.   Pulmonary/Chest: Effort normal and breath sounds normal. No respiratory distress. He has no wheezes. He has no rales. He exhibits no tenderness.  Abdominal: There is tenderness. There is no rebound and no guarding.  Mild diffuse tenderness  Neurological: He is alert.  Skin: Skin is warm and dry.  Chronic skin changes to both lower legs  Psychiatric: He has a normal mood and affect. His behavior is normal.  Nursing note and vitals reviewed.   ED Course  Procedures (including critical care time)  COORDINATION OF CARE: 3:22 PM- Discussed treatment plan with patient at bedside and patient agreed to plan.   Labs Review Labs Reviewed  BASIC METABOLIC PANEL - Abnormal; Notable for the following:    Potassium 3.0 (*)    Glucose,  Bld 115 (*)    GFR calc non Af Amer 89 (*)    All other components within normal limits  TSH - Abnormal; Notable for the following:    TSH 0.262 (*)    All other components within normal limits  URINALYSIS, ROUTINE W REFLEX MICROSCOPIC - Abnormal; Notable for the following:    Ketones, ur TRACE (*)    All other components within normal limits  CBC  HEPATIC FUNCTION PANEL  I-STAT CG4 LACTIC ACID, ED  I-STAT CG4 LACTIC ACID, ED    Imaging Review Dg Chest 2 View  08/20/2014   CLINICAL DATA:  74 year old male with weakness, fatigue, diarrhea. Symptoms x2 weeks. Initial encounter.  EXAM: CHEST  2 VIEW  COMPARISON:  None.  FINDINGS: Cardiac size at the upper limits of normal. Mild tortuosity of the thoracic aorta. Other mediastinal contours are within normal limits. Visualized tracheal air column is within normal limits. Low normal lung volumes. No pneumothorax, pulmonary edema, pleural effusion or consolidation. No confluent pulmonary opacity. No acute osseous abnormality identified.  IMPRESSION: No acute cardiopulmonary abnormality.   Electronically Signed   By: Genevie Ann M.D.   On:  08/20/2014 11:46     EKG Interpretation None      MDM   Final diagnoses:  Weakness  Diarrhea  Depression    Patient with depression and some diarrhea. Lab work overall reassuring. Patient feels better after some IV fluids. Does have depression but is not suicidal. He would rather follow-up with his primary care doctor for further evaluation. TSH is mildly low and will need to be followed.  I personally performed the services described in this documentation, which was scribed in my presence. The recorded information has been reviewed and is accurate.     Davonna Belling, MD 08/20/14 [phone]

## 2014-08-20 NOTE — Discharge Instructions (Signed)
Diarrhea Diarrhea is frequent loose and watery bowel movements. It can cause you to feel weak and dehydrated. Dehydration can cause you to become tired and thirsty, have a dry mouth, and have decreased urination that often is dark yellow. Diarrhea is a sign of another problem, most often an infection that will not last long. In most cases, diarrhea typically lasts 2-3 days. However, it can last longer if it is a sign of something more serious. It is important to treat your diarrhea as directed by your caregiver to lessen or prevent future episodes of diarrhea. CAUSES  Some common causes include: 1. Gastrointestinal infections caused by viruses, bacteria, or parasites. 2. Food poisoning or food allergies. 3. Certain medicines, such as antibiotics, chemotherapy, and laxatives. 4. Artificial sweeteners and fructose. 5. Digestive disorders. HOME CARE INSTRUCTIONS  Ensure adequate fluid intake (hydration): Have 1 cup (8 oz) of fluid for each diarrhea episode. Avoid fluids that contain simple sugars or sports drinks, fruit juices, whole milk products, and sodas. Your urine should be clear or pale yellow if you are drinking enough fluids. Hydrate with an oral rehydration solution that you can purchase at pharmacies, retail stores, and online. You can prepare an oral rehydration solution at home by mixing the following ingredients together:   - tsp table salt.   tsp baking soda.   tsp salt substitute containing potassium chloride.  1  tablespoons sugar.  1 L (34 oz) of water.  Certain foods and beverages may increase the speed at which food moves through the gastrointestinal (GI) tract. These foods and beverages should be avoided and include:  Caffeinated and alcoholic beverages.  High-fiber foods, such as raw fruits and vegetables, nuts, seeds, and whole grain breads and cereals.  Foods and beverages sweetened with sugar alcohols, such as xylitol, sorbitol, and mannitol.  Some foods may be  well tolerated and may help thicken stool including:  Starchy foods, such as rice, toast, pasta, low-sugar cereal, oatmeal, grits, baked potatoes, crackers, and bagels.  Bananas.  Applesauce.  Add probiotic-rich foods to help increase healthy bacteria in the GI tract, such as yogurt and fermented milk products.  Wash your hands well after each diarrhea episode.  Only take over-the-counter or prescription medicines as directed by your caregiver.  Take a warm bath to relieve any burning or pain from frequent diarrhea episodes. SEEK IMMEDIATE MEDICAL CARE IF:   You are unable to keep fluids down.  You have persistent vomiting.  You have blood in your stool, or your stools are black and tarry.  You do not urinate in 6-8 hours, or there is only a small amount of very dark urine.  You have abdominal pain that increases or localizes.  You have weakness, dizziness, confusion, or light-headedness.  You have a severe headache.  Your diarrhea gets worse or does not get better.  You have a fever or persistent symptoms for more than 2-3 days.  You have a fever and your symptoms suddenly get worse. MAKE SURE YOU:   Understand these instructions.  Will watch your condition.  Will get help right away if you are not doing well or get worse. Document Released: 04/08/2002 Document Revised: 09/02/2013 Document Reviewed: 12/25/2011 [facility] Patient Information 2015 [place], [place]. This information is not intended to replace advice given to you by your health care provider. Make sure you discuss any questions you have with your health care provider.  Fatigue Fatigue is a feeling of tiredness, lack of energy, lack of motivation, or feeling tired  all the time. Having enough rest, good nutrition, and reducing stress will normally reduce fatigue. Consult your caregiver if it persists. The nature of your fatigue will help your caregiver to find out its cause. The treatment is based on the cause.   CAUSES  There are many causes for fatigue. Most of the time, fatigue can be traced to one or more of your habits or routines. Most causes fit into one or more of three general areas. They are: Lifestyle problems 6. Sleep disturbances. 7. Overwork. 8. Physical exertion. 9. Unhealthy habits. 1. Poor eating habits or eating disorders. 2. Alcohol and/or drug use . 3. Lack of proper nutrition (malnutrition). Psychological problems  Stress and/or anxiety problems.  Depression.  Grief.  Boredom. Medical Problems or Conditions  Anemia.  Pregnancy.  Thyroid gland problems.  Recovery from major surgery.  Continuous pain.  Emphysema or asthma that is not well controlled  Allergic conditions.  Diabetes.  Infections (such as mononucleosis).  Obesity.  Sleep disorders, such as sleep apnea.  Heart failure or other heart-related problems.  Cancer.  Kidney disease.  Liver disease.  Effects of certain medicines such as antihistamines, cough and cold remedies, prescription pain medicines, heart and blood pressure medicines, drugs used for treatment of cancer, and some antidepressants. SYMPTOMS  The symptoms of fatigue include:   Lack of energy.  Lack of drive (motivation).  Drowsiness.  Feeling of indifference to the surroundings. DIAGNOSIS  The details of how you feel help guide your caregiver in finding out what is causing the fatigue. You will be asked about your present and past health condition. It is important to review all medicines that you take, including prescription and non-prescription items. A thorough exam will be done. You will be questioned about your feelings, habits, and normal lifestyle. Your caregiver may suggest blood tests, urine tests, or other tests to look for common medical causes of fatigue.  TREATMENT  Fatigue is treated by correcting the underlying cause. For example, if you have continuous pain or depression, treating these causes will  improve how you feel. Similarly, adjusting the dose of certain medicines will help in reducing fatigue.  HOME CARE INSTRUCTIONS   Try to get the required amount of good sleep every night.  Eat a healthy and nutritious diet, and drink enough water throughout the day.  Practice ways of relaxing (including yoga or meditation).  Exercise regularly.  Make plans to change situations that cause stress. Act on those plans so that stresses decrease over time. Keep your work and personal routine reasonable.  Avoid street drugs and minimize use of alcohol.  Start taking a daily multivitamin after consulting your caregiver. SEEK MEDICAL CARE IF:   You have persistent tiredness, which cannot be accounted for.  You have fever.  You have unintentional weight loss.  You have headaches.  You have disturbed sleep throughout the night.  You are feeling sad.  You have constipation.  You have dry skin.  You have gained weight.  You are taking any new or different medicines that you suspect are causing fatigue.  You are unable to sleep at night.  You develop any unusual swelling of your legs or other parts of your body. SEEK IMMEDIATE MEDICAL CARE IF:   You are feeling confused.  Your vision is blurred.  You feel faint or pass out.  You develop severe headache.  You develop severe abdominal, pelvic, or back pain.  You develop chest pain, shortness of breath, or an irregular or fast  heartbeat.  You are unable to pass a normal amount of urine.  You develop abnormal bleeding such as bleeding from the rectum or you vomit blood.  You have thoughts about harming yourself or committing suicide.  You are worried that you might harm someone else. MAKE SURE YOU:   Understand these instructions.  Will watch your condition.  Will get help right away if you are not doing well or get worse. Document Released: 02/13/2007 Document Revised: 07/11/2011 Document Reviewed:  08/20/2013 [facility] Patient Information 2015 [place], [place]. This information is not intended to replace advice given to you by your health care provider. Make sure you discuss any questions you have with your health care provider.  Depression Depression refers to feeling sad, low, down in the dumps, blue, gloomy, or empty. In general, there are two kinds of depression: 10. Normal sadness or normal grief. This kind of depression is one that we all feel from time to time after upsetting life experiences, such as the loss of a job or the ending of a relationship. This kind of depression is considered normal, is short lived, and resolves within a few days to 2 weeks. Depression experienced after the loss of a loved one (bereavement) often lasts longer than 2 weeks but normally gets better with time. 11. Clinical depression. This kind of depression lasts longer than normal sadness or normal grief or interferes with your ability to function at home, at work, and in school. It also interferes with your personal relationships. It affects almost every aspect of your life. Clinical depression is an illness. Symptoms of depression can also be caused by conditions other than those mentioned above, such as:  Physical illness. Some physical illnesses, including underactive thyroid gland (hypothyroidism), severe anemia, specific types of cancer, diabetes, uncontrolled seizures, heart and lung problems, strokes, and chronic pain are commonly associated with symptoms of depression.  Side effects of some prescription medicine. In some people, certain types of medicine can cause symptoms of depression.  Substance abuse. Abuse of alcohol and illicit drugs can cause symptoms of depression. SYMPTOMS Symptoms of normal sadness and normal grief include the following:  Feeling sad or crying for short periods of time.  Not caring about anything (apathy).  Difficulty sleeping or sleeping too much.  No longer able to  enjoy the things you used to enjoy.  Desire to be by oneself all the time (social isolation).  Lack of energy or motivation.  Difficulty concentrating or remembering.  Change in appetite or weight.  Restlessness or agitation. Symptoms of clinical depression include the same symptoms of normal sadness or normal grief and also the following symptoms:  Feeling sad or crying all the time.  Feelings of guilt or worthlessness.  Feelings of hopelessness or helplessness.  Thoughts of suicide or the desire to harm yourself (suicidal ideation).  Loss of touch with reality (psychotic symptoms). Seeing or hearing things that are not real (hallucinations) or having false beliefs about your life or the people around you (delusions and paranoia). DIAGNOSIS  The diagnosis of clinical depression is usually based on how bad the symptoms are and how long they have lasted. Your health care provider will also ask you questions about your medical history and substance use to find out if physical illness, use of prescription medicine, or substance abuse is causing your depression. Your health care provider may also order blood tests. TREATMENT  Often, normal sadness and normal grief do not require treatment. However, sometimes antidepressant medicine is given for  bereavement to ease the depressive symptoms until they resolve. The treatment for clinical depression depends on how bad the symptoms are but often includes antidepressant medicine, counseling with a mental health professional, or both. Your health care provider will help to determine what treatment is best for you. Depression caused by physical illness usually goes away with appropriate medical treatment of the illness. If prescription medicine is causing depression, talk with your health care provider about stopping the medicine, decreasing the dose, or changing to another medicine. Depression caused by the abuse of alcohol or illicit drugs goes away  when you stop using these substances. Some adults need professional help in order to stop drinking or using drugs. SEEK IMMEDIATE MEDICAL CARE IF:  You have thoughts about hurting yourself or others.  You lose touch with reality (have psychotic symptoms).  You are taking medicine for depression and have a serious side effect. FOR MORE INFORMATION  [employer] Mental [employer]ness: www.nami.[employer]: https://carter.com/ Document Released: 04/15/2000 Document Revised: 09/02/2013 Document Reviewed: 07/18/2011 [facility] Patient Information 2015 [place], [place]. This information is not intended to replace advice given to you by your health care provider. Make sure you discuss any questions you have with your health care provider.

## 2014-08-20 NOTE — ED Notes (Signed)
Pt's CBG en route was 122

## 2015-06-25 ENCOUNTER — Telehealth: Payer: Self-pay | Admitting: Orthopaedic Surgery

## 2015-06-25 ENCOUNTER — Ambulatory Visit: Payer: Medicare (Managed Care) | Admitting: Orthopaedic Surgery

## 2015-06-25 MED ORDER — HYDROCODONE-ACETAMINOPHEN 7.5-325 MG PO TABS: 1.0000 | ORAL_TABLET | ORAL | Status: DC | PRN

## 2015-06-25 NOTE — Telephone Encounter (Signed)
Patient requesting refill Hydrocodone 7.5/325mg  Qty 180 tablets

## 2015-06-25 NOTE — Telephone Encounter (Signed)
Patients wife pick up prescription

## 2015-06-25 NOTE — Telephone Encounter (Signed)
Rx printed

## 2015-07-23 ENCOUNTER — Telehealth: Payer: Self-pay | Admitting: Orthopaedic Surgery

## 2015-07-23 MED ORDER — HYDROCODONE-ACETAMINOPHEN 7.5-325 MG PO TABS: 1.0000 | ORAL_TABLET | ORAL | Status: DC | PRN

## 2015-07-23 NOTE — Telephone Encounter (Signed)
Patient requests refill on Norco  7.5-325 mgs.  Qty 180

## 2015-07-23 NOTE — Telephone Encounter (Signed)
Rx done. 

## 2015-08-20 ENCOUNTER — Ambulatory Visit: Payer: Medicare (Managed Care) | Admitting: Orthopaedic Surgery

## 2015-08-27 ENCOUNTER — Ambulatory Visit (INDEPENDENT_AMBULATORY_CARE_PROVIDER_SITE_OTHER): Payer: Medicare Other | Admitting: Orthopaedic Surgery

## 2015-08-27 ENCOUNTER — Encounter: Payer: Self-pay | Admitting: Orthopaedic Surgery

## 2015-08-27 VITALS — BP 182/95 | HR 68 | Temp 97.3°F | Ht 72.0 in | Wt 285.0 lb

## 2015-08-27 DIAGNOSIS — M25561 Pain in right knee: Secondary | ICD-10-CM

## 2015-08-27 MED ORDER — HYDROCODONE-ACETAMINOPHEN 7.5-325 MG PO TABS: 1.0000 | ORAL_TABLET | ORAL | Status: DC | PRN

## 2015-08-27 NOTE — Progress Notes (Signed)
Patient ID: Richard Fields, male   DOB: February 11, 1941, 75 y.o.   MRN: [id number]  CC:  I have pain of my right knee. I would like an injection.  The patient has chronic pain of the right knee.  There is no recent trauma.  There is no redness.  Injections in the past have helped.  The knee has no redness, has an effusion and crepitus present.  ROM of the knee is 0-95.  Impression:  Chronic knee pain right.  Return: one month.  PROCEDURE NOTE:  The patient requests injections of the right knee , verbal consent was obtained.  The right knee was prepped appropriately after time out was performed.   Sterile technique was observed and injection of 1 cc of Depo-Medrol 40 mg with several cc's of plain xylocaine. Anesthesia was provided by ethyl chloride and a 20-gauge needle was used to inject the knee area. The injection was tolerated well.  A band aid dressing was applied.  The patient was advised to apply ice later today and tomorrow to the injection sight as needed.

## 2015-09-24 ENCOUNTER — Ambulatory Visit: Payer: Medicare (Managed Care) | Admitting: Orthopaedic Surgery

## 2015-10-12 ENCOUNTER — Emergency Department (HOSPITAL_COMMUNITY)
Admission: EM | Admit: 2015-10-12 | Discharge: 2015-10-12 | Disposition: A | Discharge status: Home / Self Care | Payer: Medicare Other | Attending: Emergency Medicine | Admitting: Emergency Medicine

## 2015-10-12 ENCOUNTER — Encounter (HOSPITAL_COMMUNITY): Payer: Self-pay | Admitting: Emergency Medicine

## 2015-10-12 ENCOUNTER — Emergency Department (HOSPITAL_COMMUNITY): Payer: Medicare Other

## 2015-10-12 DIAGNOSIS — R600 Localized edema: Secondary | ICD-10-CM | POA: Diagnosis not present

## 2015-10-12 DIAGNOSIS — Z7982 Long term (current) use of aspirin: Secondary | ICD-10-CM | POA: Diagnosis not present

## 2015-10-12 DIAGNOSIS — I10 Essential (primary) hypertension: Secondary | ICD-10-CM | POA: Diagnosis not present

## 2015-10-12 DIAGNOSIS — E785 Hyperlipidemia, unspecified: Secondary | ICD-10-CM | POA: Insufficient documentation

## 2015-10-12 DIAGNOSIS — M7989 Other specified soft tissue disorders: Secondary | ICD-10-CM | POA: Diagnosis present

## 2015-10-12 DIAGNOSIS — E119 Type 2 diabetes mellitus without complications: Secondary | ICD-10-CM | POA: Insufficient documentation

## 2015-10-12 DIAGNOSIS — Z79899 Other long term (current) drug therapy: Secondary | ICD-10-CM | POA: Insufficient documentation

## 2015-10-12 DIAGNOSIS — R609 Edema, unspecified: Secondary | ICD-10-CM

## 2015-10-12 LAB — CBC WITH DIFFERENTIAL/PLATELET
Basophils Absolute: 0.1 10*3/uL (ref 0.0–0.1)
Basophils Relative: 1 %
EOS ABS: 0.4 10*3/uL (ref 0.0–0.7)
EOS PCT: 7 %
HCT: 43.2 % (ref 39.0–52.0)
Hemoglobin: 14.3 g/dL (ref 13.0–17.0)
LYMPHS ABS: 1 10*3/uL (ref 0.7–4.0)
Lymphocytes Relative: 18 %
MCH: 30.6 pg (ref 26.0–34.0)
MCHC: 33.1 g/dL (ref 30.0–36.0)
MCV: 92.3 fL (ref 78.0–100.0)
MONO ABS: 0.5 10*3/uL (ref 0.1–1.0)
MONOS PCT: 9 %
NEUTROS PCT: 65 %
Neutro Abs: 3.6 10*3/uL (ref 1.7–7.7)
Platelets: 235 10*3/uL (ref 150–400)
RBC: 4.68 MIL/uL (ref 4.22–5.81)
RDW: 13 % (ref 11.5–15.5)
WBC: 5.6 10*3/uL (ref 4.0–10.5)

## 2015-10-12 LAB — COMPREHENSIVE METABOLIC PANEL
ALK PHOS: 103 U/L (ref 38–126)
ALT: 20 U/L (ref 17–63)
ANION GAP: 7 (ref 5–15)
AST: 25 U/L (ref 15–41)
Albumin: 3.3 g/dL — ABNORMAL LOW (ref 3.5–5.0)
BUN: 13 mg/dL (ref 6–20)
CALCIUM: 8.3 mg/dL — AB (ref 8.9–10.3)
CO2: 26 mmol/L (ref 22–32)
Chloride: 101 mmol/L (ref 101–111)
Creatinine, Ser: 0.65 mg/dL (ref 0.61–1.24)
GFR calc non Af Amer: >60 mL/min (ref >60)
Glucose, Bld: 249 mg/dL — ABNORMAL HIGH (ref 65–99)
Potassium: 3.8 mmol/L (ref 3.5–5.1)
SODIUM: 134 mmol/L — AB (ref 135–145)
Total Bilirubin: 0.6 mg/dL (ref 0.3–1.2)
Total Protein: 6.9 g/dL (ref 6.5–8.1)

## 2015-10-12 MED ORDER — FUROSEMIDE 40 MG PO TABS: 40.0000 mg | ORAL_TABLET | Freq: Two times a day (BID) | ORAL | Status: DC

## 2015-10-12 NOTE — ED Provider Notes (Signed)
CSN: [id number]     Arrival date & time 10/12/15  1649 History  By signing my name below, I, Ephriam Jenkins, attest that this documentation has been prepared under the direction and in the presence of No att. providers found. Electronically signed, Ephriam Jenkins, ED Scribe. 10/12/2015. 10:00 PM.    Chief Complaint  Patient presents with  . Foot Swelling   The history is provided by the patient. No language interpreter was used.   HPI Comments: Richard Fields is a 75 y.o. male with a PMHx of NIDDM and HTN, who presents to the Emergency Department complaining of swelling to bilateral lower extremities onset three days ago. Pt also reports associated edema to both legs. Pt also reports his lower extremities are more red than usual. Pt denies remembering any recent injury. Pt denies any pain. Pt further denies fever, chest pain, shortness of breath. Pt also denies taking fluid pills. His PCP is Dr. Willey Blade.   Past Medical History  Diagnosis Date  . Chronic constipation   . Hypertension   . Hyperlipidemia   . Peripheral neuropathy ([place])   . Cervical disc disease   . Diabetes mellitus without complication ([place])     Borderline diabetic-diet controlled   Past Surgical History  Procedure Laterality Date  . Tonsillectomy    . Hernia repair    . Colonoscopy  2003    RMR: internal hemorrhoids, otherwise normal  . Colonoscopy  05/30/2012    Procedure: COLONOSCOPY;  Surgeon: Daneil Dolin, MD;  Location: [facility];  Service: Endoscopy;  Laterality: N/A;  12:00   Family History  Problem Relation Age of Onset  . Colon cancer Paternal Uncle   . Diabetes Brother   . Cancer Father    Social History  Substance Use Topics  . Smoking status: Never Smoker   . Smokeless tobacco: None  . Alcohol Use: No    Review of Systems  Constitutional: Negative for fever.  Respiratory: Negative for shortness of breath.   Cardiovascular: Negative for chest pain.  Musculoskeletal: Negative for arthralgias.   Skin: Positive for color change (erythema ).       Edema to bilateral lower extremities    Allergies  Penicillins and Tegretol  Home Medications   Prior to Admission medications   Medication Sig Start Date End Date Taking? Authorizing Provider  AMITIZA 24 MCG capsule Take 2 capsules by mouth daily. 08/14/13   Historical Provider, MD  aspirin 325 MG tablet Take 325 mg by mouth daily.    Historical Provider, MD  atorvastatin (LIPITOR) 10 MG tablet Take 10 mg by mouth daily.  05/10/12   Historical Provider, MD  clobetasol ointment (TEMOVATE) [id number] % Apply 1 application topically as needed. 07/20/13   Historical Provider, MD  fluticasone (CUTIVATE) 0.05 % cream Apply 1 application topically as needed. 07/20/13   Historical Provider, MD  furosemide (LASIX) 40 MG tablet Take 1 tablet (40 mg total) by mouth 2 (two) times daily. 10/12/15   Davonna Belling, MD  HYDROcodone-acetaminophen (NORCO) 7.5-325 MG tablet Take 1 tablet by mouth every 4 (four) hours as needed for moderate pain (Must last 30 days.  Do not drive or operate machinery while taking this medicine.). 08/27/15   Sanjuana Kava, MD  ketoconazole (NIZORAL) 2 % cream Apply 1 application topically as needed. 07/26/13   Historical Provider, MD  lisinopril (PRINIVIL,ZESTRIL) 10 MG tablet Take 10 mg by mouth daily.  05/10/12   Historical Provider, MD  LORazepam (ATIVAN) 2 MG tablet Take  2 mg by mouth at bedtime.    Historical Provider, MD  meloxicam (MOBIC) 7.5 MG tablet Take 1 tablet by mouth daily. 07/28/14   Historical Provider, MD  mirtazapine (REMERON) 15 MG tablet Take 7.5 mg by mouth at bedtime. 08/18/14   Historical Provider, MD  polyethylene glycol powder (GLYCOLAX/MIRALAX) powder Take 17 g by mouth 2 (two) times daily.  05/04/12   Historical Provider, MD  polyethylene glycol-electrolytes (TRILYTE) 420 G solution Take 4,000 mLs by mouth as directed. 05/21/12   Daneil Dolin, MD  potassium chloride SA (K-DUR,KLOR-CON) 20 MEQ tablet Take 0.5  tablets by mouth daily. 08/26/13   Historical Provider, MD  pregabalin (LYRICA) 150 MG capsule Take 150 mg by mouth 2 (two) times daily.    Historical Provider, MD  traZODone (DESYREL) 50 MG tablet Take 1 tablet by mouth at bedtime. 07/28/14   Historical Provider, MD   BP 140/69 mmHg  Pulse 63  Temp(Src) 98.3 F (36.8 C) (Oral)  Resp 20  Ht 6' (1.829 m)  Wt 280 lb (127.007 kg)  BMI 37.97 kg/m2  SpO2 95% Physical Exam  Neck: No JVD present.  Cardiovascular:  Strong dorsalis pedis pulse Left foot has dorsalis pedis pulse    Pulmonary/Chest: Breath sounds normal.  Musculoskeletal: He exhibits edema.  Moderate edema of right lower extremity   Worse edema on right  Neurological:  Good sensation over great toe   Skin:  Redening of skin without erythema  No scaling Dry and cracked skin on bilateral feet Blister filled with dark blood medial and lateral on left great toe Chronic venous changes with chronic erythema worse on left compred to right      ED Course  Procedures  9:42 PM-Will order imaging. Discussed treatment plan with pt at bedside and pt agreed to plan.   Labs Review Labs Reviewed  COMPREHENSIVE METABOLIC PANEL - Abnormal; Notable for the following:    Sodium 134 (*)    Glucose, Bld 249 (*)    Calcium 8.3 (*)    Albumin 3.3 (*)    All other components within normal limits  CBC WITH DIFFERENTIAL/PLATELET    Imaging Review Dg Toe Great Left  10/12/2015  CLINICAL DATA:  Bruising and swelling to the left great toe for 4 days. No pain. No known injury. EXAM: LEFT GREAT TOE COMPARISON:  None. FINDINGS: There is no evidence of fracture or dislocation. There is no evidence of arthropathy or other focal bone abnormality. Soft tissues are unremarkable. IMPRESSION: Negative. Electronically Signed   By: Lucienne Capers M.D.   On: 10/12/2015 22:37   I have personally reviewed and evaluated these images and lab results as part of my medical decision-making.   EKG  Interpretation None      MDM   Final diagnoses:  Bilateral edema of lower extremity  Patient bilateral foot swelling. Bruise/vesicle with blood on left great toe. X-ray shows no fracture. Glucose is mildly elevated. Will follow-up with Dr. Willey Blade. Will increase patient's Lasix for 4 days. I personally performed the services described in this documentation, which was scribed in my presence. The recorded information has been reviewed and is accurate.       Davonna Belling, MD 10/13/15 [phone]

## 2015-10-12 NOTE — Discharge Instructions (Signed)
Edema °Edema is an abnormal buildup of fluids in your body tissues. Edema is somewhat dependent on gravity to pull the fluid to the lowest place in your body. That makes the condition more common in the legs and thighs (lower extremities). Painless swelling of the feet and ankles is common and becomes more likely as you get older. It is also common in looser tissues, like around your eyes.  °When the affected area is squeezed, the fluid may move out of that spot and leave a dent for a few moments. This dent is called pitting.  °CAUSES  °There are many possible causes of edema. Eating too much salt and being on your feet or sitting for a long time can cause edema in your legs and ankles. Hot weather may make edema worse. Common medical causes of edema include: °· Heart failure. °· Liver disease. °· Kidney disease. °· Weak blood vessels in your legs. °· Cancer. °· An injury. °· Pregnancy. °· Some medications. °· Obesity.  °SYMPTOMS  °Edema is usually painless. Your skin may look swollen or shiny.  °DIAGNOSIS  °Your health care provider may be able to diagnose edema by asking about your medical history and doing a physical exam. You may need to have tests such as X-rays, an electrocardiogram, or blood tests to check for medical conditions that may cause edema.  °TREATMENT  °Edema treatment depends on the cause. If you have heart, liver, or kidney disease, you need the treatment appropriate for these conditions. General treatment may include: °· Elevation of the affected body part above the level of your heart. °· Compression of the affected body part. Pressure from elastic bandages or support stockings squeezes the tissues and forces fluid back into the blood vessels. This keeps fluid from entering the tissues. °· Restriction of fluid and salt intake. °· Use of a water pill (diuretic). These medications are appropriate only for some types of edema. They pull fluid out of your body and make you urinate more often. This  gets rid of fluid and reduces swelling, but diuretics can have side effects. Only use diuretics as directed by your health care provider. °HOME CARE INSTRUCTIONS  °· Keep the affected body part above the level of your heart when you are lying down.   °· Do not sit still or stand for prolonged periods.   °· Do not put anything directly under your knees when lying down. °· Do not wear constricting clothing or garters on your upper legs.   °· Exercise your legs to work the fluid back into your blood vessels. This may help the swelling go down.   °· Wear elastic bandages or support stockings to reduce ankle swelling as directed by your health care provider.   °· Eat a low-salt diet to reduce fluid if your health care provider recommends it.   °· Only take medicines as directed by your health care provider.  °SEEK MEDICAL CARE IF:  °· Your edema is not responding to treatment. °· You have heart, liver, or kidney disease and notice symptoms of edema. °· You have edema in your legs that does not improve after elevating them.   °· You have sudden and unexplained weight gain. °SEEK IMMEDIATE MEDICAL CARE IF:  °· You develop shortness of breath or chest pain.   °· You cannot breathe when you lie down. °· You develop pain, redness, or warmth in the swollen areas.   °· You have heart, liver, or kidney disease and suddenly get edema. °· You have a fever and your symptoms suddenly get worse. °MAKE SURE YOU:  °·   Understand these instructions. °· Will watch your condition. °· Will get help right away if you are not doing well or get worse. °  °This information is not intended to replace advice given to you by your health care provider. Make sure you discuss any questions you have with your health care provider. °  °Document Released: 04/18/2005 Document Revised: 05/09/2014 Document Reviewed: 02/08/2013 °Elsevier Interactive Patient Education ©2016 [employer] ° °

## 2015-10-12 NOTE — ED Notes (Signed)
Pt states he has been having swelling in feet and legs bilaterally and left great toe is turning black.  Denies injury.

## 2015-10-13 ENCOUNTER — Ambulatory Visit (HOSPITAL_COMMUNITY): Payer: Medicare Other

## 2015-10-13 ENCOUNTER — Ambulatory Visit (HOSPITAL_COMMUNITY)
Admission: RE | Admit: 2015-10-13 | Discharge: 2015-10-13 | Disposition: A | Discharge status: Home / Self Care | Payer: Medicare Other | Source: Ambulatory Visit | Attending: Emergency Medicine | Admitting: Emergency Medicine

## 2015-10-13 DIAGNOSIS — R609 Edema, unspecified: Secondary | ICD-10-CM

## 2015-11-30 ENCOUNTER — Telehealth: Payer: Self-pay | Admitting: Orthopaedic Surgery

## 2015-11-30 MED ORDER — HYDROCODONE-ACETAMINOPHEN 7.5-325 MG PO TABS: 1.0000 | ORAL_TABLET | ORAL | 0 refills | Status: DC | PRN

## 2015-11-30 NOTE — Telephone Encounter (Signed)
Patient requests a refill on Hydrocodone/Acetaminophen (Norco)  7.5-325 mgs.  Qty  180  Sig: Take 1 tablet by mouth every 4 (four) hours as needed for moderate pain (Must last 30 days. Do not drive or operate machinery while taking this medicine.).

## 2015-12-02 ENCOUNTER — Encounter: Payer: Self-pay | Admitting: Orthopaedic Surgery

## 2015-12-02 ENCOUNTER — Ambulatory Visit (INDEPENDENT_AMBULATORY_CARE_PROVIDER_SITE_OTHER): Payer: Medicare (Managed Care) | Admitting: Orthopaedic Surgery

## 2015-12-02 VITALS — BP 122/64 | HR 97 | Temp 97.7°F | Resp 20 | Ht 72.0 in | Wt 285.0 lb

## 2015-12-02 DIAGNOSIS — M25562 Pain in left knee: Secondary | ICD-10-CM

## 2015-12-02 DIAGNOSIS — M25561 Pain in right knee: Secondary | ICD-10-CM

## 2015-12-02 DIAGNOSIS — I10 Essential (primary) hypertension: Secondary | ICD-10-CM

## 2015-12-02 MED ORDER — HYDROCODONE-ACETAMINOPHEN 7.5-325 MG PO TABS: 1.0000 | ORAL_TABLET | ORAL | 0 refills | Status: DC | PRN

## 2015-12-02 NOTE — Progress Notes (Signed)
CC: Both of my knees are hurting. I would like an injection in both knees.  The patient has had chronic pain and tenderness of both knees for some time.  Injections help.  There is no locking or giving way of the knee.  There is no new trauma. There is no redness or signs of infections.  The knees have a mild effusion and some crepitus.  There is no redness or signs of recent trauma.  Right knee ROM is 0-90 and left knee ROM is 0-95.  Impression:  Chronic pain of the both knees  Return:  3 months  PROCEDURE NOTE:  The patient requests injections of both knees, verbal consent was obtained.  The left and right knee were individually prepped appropriately after time out was performed.   Sterile technique was observed and injection of 1 cc of Depo-Medrol 40 mg with several cc's of plain xylocaine. Anesthesia was provided by ethyl chloride and a 20-gauge needle was used to inject each knee area. The injections were tolerated well.  A band aid dressing was applied.  The patient was advised to apply ice later today and tomorrow to the injection sight as needed.   Electronically Signed Sanjuana Kava, MD 8/2/20172:30 PM

## 2016-01-14 ENCOUNTER — Telehealth: Payer: Self-pay | Admitting: Orthopaedic Surgery

## 2016-01-14 MED ORDER — HYDROCODONE-ACETAMINOPHEN 7.5-325 MG PO TABS: 1.0000 | ORAL_TABLET | Freq: Four times a day (QID) | ORAL | 0 refills | Status: DC | PRN

## 2016-01-14 NOTE — Telephone Encounter (Signed)
Hydrocodone-Acetaminophen 7.5/325mg   Qty 180 Tablets

## 2016-01-31 DIAGNOSIS — J869 Pyothorax without fistula: Secondary | ICD-10-CM

## 2016-01-31 HISTORY — DX: Pyothorax without fistula: J86.9

## 2016-02-18 ENCOUNTER — Inpatient Hospital Stay (HOSPITAL_COMMUNITY)
Admission: EM | Admit: 2016-02-18 | Discharge: 2016-03-15 | DRG: 871 | Disposition: A | Discharge status: Skilled Nursing Facility | Payer: Medicare Other | Attending: Internal Medicine | Admitting: Internal Medicine

## 2016-02-18 ENCOUNTER — Inpatient Hospital Stay (HOSPITAL_COMMUNITY): Payer: Medicare Other

## 2016-02-18 ENCOUNTER — Encounter (HOSPITAL_COMMUNITY): Payer: Self-pay | Admitting: Emergency Medicine

## 2016-02-18 ENCOUNTER — Emergency Department (HOSPITAL_COMMUNITY): Payer: Medicare Other

## 2016-02-18 DIAGNOSIS — G9341 Metabolic encephalopathy: Secondary | ICD-10-CM | POA: Diagnosis present

## 2016-02-18 DIAGNOSIS — A419 Sepsis, unspecified organism: Secondary | ICD-10-CM | POA: Diagnosis present

## 2016-02-18 DIAGNOSIS — E1142 Type 2 diabetes mellitus with diabetic polyneuropathy: Secondary | ICD-10-CM | POA: Diagnosis present

## 2016-02-18 DIAGNOSIS — J189 Pneumonia, unspecified organism: Secondary | ICD-10-CM | POA: Diagnosis not present

## 2016-02-18 DIAGNOSIS — K920 Hematemesis: Secondary | ICD-10-CM | POA: Diagnosis present

## 2016-02-18 DIAGNOSIS — J984 Other disorders of lung: Secondary | ICD-10-CM | POA: Diagnosis not present

## 2016-02-18 DIAGNOSIS — D62 Acute posthemorrhagic anemia: Secondary | ICD-10-CM | POA: Diagnosis not present

## 2016-02-18 DIAGNOSIS — J9690 Respiratory failure, unspecified, unspecified whether with hypoxia or hypercapnia: Secondary | ICD-10-CM

## 2016-02-18 DIAGNOSIS — M25511 Pain in right shoulder: Secondary | ICD-10-CM | POA: Diagnosis present

## 2016-02-18 DIAGNOSIS — E1165 Type 2 diabetes mellitus with hyperglycemia: Secondary | ICD-10-CM | POA: Diagnosis present

## 2016-02-18 DIAGNOSIS — K922 Gastrointestinal hemorrhage, unspecified: Secondary | ICD-10-CM | POA: Diagnosis not present

## 2016-02-18 DIAGNOSIS — R000 Tachycardia, unspecified: Secondary | ICD-10-CM

## 2016-02-18 DIAGNOSIS — Z4682 Encounter for fitting and adjustment of non-vascular catheter: Secondary | ICD-10-CM

## 2016-02-18 DIAGNOSIS — J869 Pyothorax without fistula: Secondary | ICD-10-CM | POA: Diagnosis present

## 2016-02-18 DIAGNOSIS — J9601 Acute respiratory failure with hypoxia: Secondary | ICD-10-CM | POA: Diagnosis present

## 2016-02-18 DIAGNOSIS — L89159 Pressure ulcer of sacral region, unspecified stage: Secondary | ICD-10-CM | POA: Diagnosis present

## 2016-02-18 DIAGNOSIS — E876 Hypokalemia: Secondary | ICD-10-CM | POA: Diagnosis present

## 2016-02-18 DIAGNOSIS — I10 Essential (primary) hypertension: Secondary | ICD-10-CM | POA: Diagnosis present

## 2016-02-18 DIAGNOSIS — J9600 Acute respiratory failure, unspecified whether with hypoxia or hypercapnia: Secondary | ICD-10-CM

## 2016-02-18 DIAGNOSIS — E43 Unspecified severe protein-calorie malnutrition: Secondary | ICD-10-CM | POA: Diagnosis present

## 2016-02-18 DIAGNOSIS — E1169 Type 2 diabetes mellitus with other specified complication: Secondary | ICD-10-CM | POA: Diagnosis present

## 2016-02-18 DIAGNOSIS — IMO0002 Reserved for concepts with insufficient information to code with codable children: Secondary | ICD-10-CM | POA: Diagnosis present

## 2016-02-18 DIAGNOSIS — R0602 Shortness of breath: Secondary | ICD-10-CM

## 2016-02-18 DIAGNOSIS — F17200 Nicotine dependence, unspecified, uncomplicated: Secondary | ICD-10-CM | POA: Diagnosis present

## 2016-02-18 DIAGNOSIS — R0902 Hypoxemia: Secondary | ICD-10-CM

## 2016-02-18 DIAGNOSIS — J851 Abscess of lung with pneumonia: Secondary | ICD-10-CM | POA: Diagnosis present

## 2016-02-18 DIAGNOSIS — Z978 Presence of other specified devices: Secondary | ICD-10-CM

## 2016-02-18 DIAGNOSIS — Z95828 Presence of other vascular implants and grafts: Secondary | ICD-10-CM

## 2016-02-18 DIAGNOSIS — L8990 Pressure ulcer of unspecified site, unspecified stage: Secondary | ICD-10-CM | POA: Diagnosis present

## 2016-02-18 DIAGNOSIS — I4891 Unspecified atrial fibrillation: Secondary | ICD-10-CM

## 2016-02-18 DIAGNOSIS — J90 Pleural effusion, not elsewhere classified: Secondary | ICD-10-CM | POA: Diagnosis present

## 2016-02-18 DIAGNOSIS — R7989 Other specified abnormal findings of blood chemistry: Secondary | ICD-10-CM | POA: Diagnosis not present

## 2016-02-18 DIAGNOSIS — J942 Hemothorax: Secondary | ICD-10-CM | POA: Diagnosis present

## 2016-02-18 DIAGNOSIS — Z794 Long term (current) use of insulin: Secondary | ICD-10-CM

## 2016-02-18 DIAGNOSIS — M6281 Muscle weakness (generalized): Secondary | ICD-10-CM

## 2016-02-18 DIAGNOSIS — R109 Unspecified abdominal pain: Secondary | ICD-10-CM

## 2016-02-18 DIAGNOSIS — Z7984 Long term (current) use of oral hypoglycemic drugs: Secondary | ICD-10-CM

## 2016-02-18 DIAGNOSIS — J440 Chronic obstructive pulmonary disease with acute lower respiratory infection: Secondary | ICD-10-CM | POA: Diagnosis present

## 2016-02-18 DIAGNOSIS — Z452 Encounter for adjustment and management of vascular access device: Secondary | ICD-10-CM

## 2016-02-18 DIAGNOSIS — I248 Other forms of acute ischemic heart disease: Secondary | ICD-10-CM | POA: Diagnosis present

## 2016-02-18 DIAGNOSIS — I471 Supraventricular tachycardia: Secondary | ICD-10-CM | POA: Diagnosis present

## 2016-02-18 DIAGNOSIS — E46 Unspecified protein-calorie malnutrition: Secondary | ICD-10-CM | POA: Diagnosis present

## 2016-02-18 DIAGNOSIS — Z6841 Body Mass Index (BMI) 40.0 and over, adult: Secondary | ICD-10-CM | POA: Diagnosis not present

## 2016-02-18 DIAGNOSIS — I878 Other specified disorders of veins: Secondary | ICD-10-CM | POA: Diagnosis present

## 2016-02-18 DIAGNOSIS — J9811 Atelectasis: Secondary | ICD-10-CM | POA: Diagnosis present

## 2016-02-18 DIAGNOSIS — Z09 Encounter for follow-up examination after completed treatment for conditions other than malignant neoplasm: Secondary | ICD-10-CM

## 2016-02-18 DIAGNOSIS — E785 Hyperlipidemia, unspecified: Secondary | ICD-10-CM | POA: Diagnosis present

## 2016-02-18 DIAGNOSIS — K269 Duodenal ulcer, unspecified as acute or chronic, without hemorrhage or perforation: Secondary | ICD-10-CM | POA: Diagnosis present

## 2016-02-18 DIAGNOSIS — R042 Hemoptysis: Secondary | ICD-10-CM | POA: Diagnosis present

## 2016-02-18 DIAGNOSIS — W19XXXA Unspecified fall, initial encounter: Secondary | ICD-10-CM | POA: Diagnosis present

## 2016-02-18 DIAGNOSIS — R531 Weakness: Secondary | ICD-10-CM | POA: Diagnosis present

## 2016-02-18 DIAGNOSIS — E118 Type 2 diabetes mellitus with unspecified complications: Secondary | ICD-10-CM

## 2016-02-18 DIAGNOSIS — Z833 Family history of diabetes mellitus: Secondary | ICD-10-CM

## 2016-02-18 DIAGNOSIS — Z7982 Long term (current) use of aspirin: Secondary | ICD-10-CM

## 2016-02-18 DIAGNOSIS — Z9889 Other specified postprocedural states: Secondary | ICD-10-CM

## 2016-02-18 DIAGNOSIS — K6389 Other specified diseases of intestine: Secondary | ICD-10-CM | POA: Diagnosis present

## 2016-02-18 DIAGNOSIS — E860 Dehydration: Secondary | ICD-10-CM | POA: Diagnosis present

## 2016-02-18 DIAGNOSIS — R778 Other specified abnormalities of plasma proteins: Secondary | ICD-10-CM

## 2016-02-18 LAB — CBC WITH DIFFERENTIAL/PLATELET
BASOS ABS: 0 10*3/uL (ref 0.0–0.1)
Basophils Relative: 0 %
EOS ABS: 0 10*3/uL (ref 0.0–0.7)
Eosinophils Relative: 0 %
HCT: 44.6 % (ref 39.0–52.0)
Hemoglobin: 14.7 g/dL (ref 13.0–17.0)
Lymphocytes Relative: 5 %
Lymphs Abs: 0.9 10*3/uL (ref 0.7–4.0)
MCH: 31.1 pg (ref 26.0–34.0)
MCHC: 33 g/dL (ref 30.0–36.0)
MCV: 94.3 fL (ref 78.0–100.0)
Monocytes Absolute: 2 10*3/uL — ABNORMAL HIGH (ref 0.1–1.0)
Monocytes Relative: 10 %
Neutro Abs: 17 10*3/uL — ABNORMAL HIGH (ref 1.7–7.7)
Neutrophils Relative %: 85 %
PLATELETS: 298 10*3/uL (ref 150–400)
RBC: 4.73 MIL/uL (ref 4.22–5.81)
RDW: 12.9 % (ref 11.5–15.5)
WBC: 19.9 10*3/uL — AB (ref 4.0–10.5)

## 2016-02-18 LAB — TROPONIN I
TROPONIN I: 0.11 ng/mL — AB (ref <0.03)
TROPONIN I: 0.11 ng/mL — AB (ref <0.03)
Troponin I: 0.1 ng/mL (ref <0.03)

## 2016-02-18 LAB — COMPREHENSIVE METABOLIC PANEL
ALBUMIN: 3.5 g/dL (ref 3.5–5.0)
ALT: 20 U/L (ref 17–63)
AST: 19 U/L (ref 15–41)
Alkaline Phosphatase: 111 U/L (ref 38–126)
Anion gap: 9 (ref 5–15)
BUN: 17 mg/dL (ref 6–20)
CO2: 28 mmol/L (ref 22–32)
Calcium: 9.1 mg/dL (ref 8.9–10.3)
Chloride: 98 mmol/L — ABNORMAL LOW (ref 101–111)
Creatinine, Ser: 0.88 mg/dL (ref 0.61–1.24)
GFR calc Af Amer: >60 mL/min (ref >60)
GFR calc non Af Amer: >60 mL/min (ref >60)
GLUCOSE: 292 mg/dL — AB (ref 65–99)
POTASSIUM: 4.2 mmol/L (ref 3.5–5.1)
Sodium: 135 mmol/L (ref 135–145)
Total Bilirubin: 0.9 mg/dL (ref 0.3–1.2)
Total Protein: 8.2 g/dL — ABNORMAL HIGH (ref 6.5–8.1)

## 2016-02-18 LAB — URINALYSIS, ROUTINE W REFLEX MICROSCOPIC
Bilirubin Urine: NEGATIVE
Glucose, UA: 500 mg/dL — AB
Hgb urine dipstick: NEGATIVE
Ketones, ur: 15 mg/dL — AB
Leukocytes, UA: NEGATIVE
Nitrite: NEGATIVE
SPECIFIC GRAVITY, URINE: 1.015 (ref 1.005–1.030)
pH: 6 (ref 5.0–8.0)

## 2016-02-18 LAB — LACTIC ACID, PLASMA
LACTIC ACID, VENOUS: 1.9 mmol/L (ref 0.5–1.9)
LACTIC ACID, VENOUS: 3.5 mmol/L — AB (ref 0.5–1.9)

## 2016-02-18 LAB — BRAIN NATRIURETIC PEPTIDE: B Natriuretic Peptide: 50 pg/mL (ref 0.0–100.0)

## 2016-02-18 LAB — LIPASE, BLOOD: Lipase: 30 U/L (ref 11–51)

## 2016-02-18 LAB — URINE MICROSCOPIC-ADD ON
BACTERIA UA: NONE SEEN
RBC / HPF: NONE SEEN RBC/hpf (ref 0–5)
SQUAMOUS EPITHELIAL / LPF: NONE SEEN

## 2016-02-18 LAB — GLUCOSE, CAPILLARY: Glucose-Capillary: 255 mg/dL — ABNORMAL HIGH (ref 65–99)

## 2016-02-18 LAB — SALICYLATE LEVEL

## 2016-02-18 LAB — SAMPLE TO BLOOD BANK

## 2016-02-18 LAB — PROTIME-INR
INR: 1.08
Prothrombin Time: 14 seconds (ref 11.4–15.2)

## 2016-02-18 LAB — POC OCCULT BLOOD, ED: FECAL OCCULT BLD: NEGATIVE

## 2016-02-18 LAB — D-DIMER, QUANTITATIVE (NOT AT ARMC): D DIMER QUANT: 0.92 ug{FEU}/mL — AB (ref 0.00–0.50)

## 2016-02-18 LAB — APTT: APTT: 32 s (ref 24–36)

## 2016-02-18 MED ORDER — SODIUM CHLORIDE 0.9 % IV SOLN
INTRAVENOUS | Status: DC
  Administered 2016-02-18 – 2016-03-07 (×2): via INTRAVENOUS

## 2016-02-18 MED ORDER — SENNOSIDES-DOCUSATE SODIUM 8.6-50 MG PO TABS
1.0000 | ORAL_TABLET | Freq: Every evening | ORAL | Status: DC | PRN
  Administered 2016-02-27: 1 via ORAL
  Filled 2016-02-18: qty 1

## 2016-02-18 MED ORDER — SODIUM CHLORIDE 0.9 % IV SOLN
Freq: Once | INTRAVENOUS | Status: AC
  Administered 2016-02-18: 09:00:00 via INTRAVENOUS

## 2016-02-18 MED ORDER — LOSARTAN POTASSIUM 50 MG PO TABS
50.0000 mg | ORAL_TABLET | Freq: Every day | ORAL | Status: DC
  Administered 2016-02-18 – 2016-03-06 (×13): 50 mg via ORAL
  Filled 2016-02-18 (×14): qty 1

## 2016-02-18 MED ORDER — ATORVASTATIN CALCIUM 10 MG PO TABS
10.0000 mg | ORAL_TABLET | Freq: Every day | ORAL | Status: DC
  Administered 2016-02-18 – 2016-02-19 (×2): 10 mg via ORAL
  Filled 2016-02-18 (×2): qty 1

## 2016-02-18 MED ORDER — SODIUM CHLORIDE 0.9 % IV SOLN: INTRAVENOUS | Status: DC

## 2016-02-18 MED ORDER — MIRTAZAPINE 15 MG PO TABS
15.0000 mg | ORAL_TABLET | Freq: Every day | ORAL | Status: DC
  Administered 2016-02-19 – 2016-03-06 (×17): 15 mg via ORAL
  Filled 2016-02-18 (×17): qty 1

## 2016-02-18 MED ORDER — INSULIN ASPART 100 UNIT/ML ~~LOC~~ SOLN
0.0000 [IU] | Freq: Every day | SUBCUTANEOUS | Status: DC
  Administered 2016-02-18: 2 [IU] via SUBCUTANEOUS
  Administered 2016-02-19: 100 [IU] via SUBCUTANEOUS

## 2016-02-18 MED ORDER — IMIPENEM-CILASTATIN 500 MG IV SOLR
500.0000 mg | Freq: Four times a day (QID) | INTRAVENOUS | Status: DC
  Administered 2016-02-18 – 2016-03-01 (×48): 500 mg via INTRAVENOUS
  Filled 2016-02-18 (×49): qty 500

## 2016-02-18 MED ORDER — INSULIN ASPART 100 UNIT/ML ~~LOC~~ SOLN
0.0000 [IU] | Freq: Three times a day (TID) | SUBCUTANEOUS | Status: DC
  Administered 2016-02-18 – 2016-02-19 (×2): 8 [IU] via SUBCUTANEOUS
  Administered 2016-02-19 – 2016-02-20 (×3): 11 [IU] via SUBCUTANEOUS

## 2016-02-18 MED ORDER — FENTANYL CITRATE (PF) 100 MCG/2ML IJ SOLN
INTRAMUSCULAR | Status: AC
  Filled 2016-02-18: qty 2

## 2016-02-18 MED ORDER — IOPAMIDOL (ISOVUE-370) INJECTION 76%
100.0000 mL | Freq: Once | INTRAVENOUS | Status: AC | PRN
  Administered 2016-02-18: 100 mL via INTRAVENOUS

## 2016-02-18 MED ORDER — KETOROLAC TROMETHAMINE 15 MG/ML IJ SOLN
15.0000 mg | Freq: Four times a day (QID) | INTRAMUSCULAR | Status: AC | PRN
  Administered 2016-02-18 (×2): 15 mg via INTRAVENOUS
  Filled 2016-02-18 (×2): qty 1

## 2016-02-18 MED ORDER — FENTANYL CITRATE (PF) 100 MCG/2ML IJ SOLN
50.0000 ug | Freq: Once | INTRAMUSCULAR | Status: AC
  Administered 2016-02-18: 50 ug via INTRAVENOUS

## 2016-02-18 MED ORDER — HEPARIN SODIUM (PORCINE) 5000 UNIT/ML IJ SOLN
5000.0000 [IU] | Freq: Three times a day (TID) | INTRAMUSCULAR | Status: DC
  Administered 2016-02-18 – 2016-02-27 (×28): 5000 [IU] via SUBCUTANEOUS
  Filled 2016-02-18 (×26): qty 1

## 2016-02-18 MED ORDER — ALBUTEROL SULFATE (2.5 MG/3ML) 0.083% IN NEBU
2.5000 mg | INHALATION_SOLUTION | RESPIRATORY_TRACT | Status: DC | PRN
  Administered 2016-02-19: 2.5 mg via RESPIRATORY_TRACT

## 2016-02-18 MED ORDER — INSULIN ASPART 100 UNIT/ML ~~LOC~~ SOLN
4.0000 [IU] | Freq: Three times a day (TID) | SUBCUTANEOUS | Status: DC
  Administered 2016-02-18 – 2016-02-19 (×3): 4 [IU] via SUBCUTANEOUS

## 2016-02-18 MED ORDER — TRAZODONE HCL 50 MG PO TABS
100.0000 mg | ORAL_TABLET | Freq: Every day | ORAL | Status: DC
  Administered 2016-02-19 – 2016-03-06 (×16): 100 mg via ORAL
  Filled 2016-02-18 (×16): qty 2

## 2016-02-18 MED ORDER — VANCOMYCIN HCL 10 G IV SOLR
1500.0000 mg | Freq: Once | INTRAVENOUS | Status: AC
  Administered 2016-02-18: 1500 mg via INTRAVENOUS
  Filled 2016-02-18: qty 1500

## 2016-02-18 MED ORDER — ONDANSETRON HCL 4 MG PO TABS: 4.0000 mg | ORAL_TABLET | Freq: Four times a day (QID) | ORAL | Status: DC | PRN

## 2016-02-18 MED ORDER — ACETAMINOPHEN 650 MG RE SUPP: 650.0000 mg | Freq: Four times a day (QID) | RECTAL | Status: DC | PRN

## 2016-02-18 MED ORDER — FUROSEMIDE 10 MG/ML IJ SOLN
40.0000 mg | Freq: Once | INTRAMUSCULAR | Status: AC
  Administered 2016-02-18: 40 mg via INTRAVENOUS
  Filled 2016-02-18: qty 4

## 2016-02-18 MED ORDER — SODIUM CHLORIDE 0.9% FLUSH
3.0000 mL | Freq: Two times a day (BID) | INTRAVENOUS | Status: DC
  Administered 2016-02-18 – 2016-02-23 (×10): 3 mL via INTRAVENOUS
  Administered 2016-02-24: 10 mL via INTRAVENOUS
  Administered 2016-02-25 – 2016-03-06 (×16): 3 mL via INTRAVENOUS

## 2016-02-18 MED ORDER — LEVOFLOXACIN IN D5W 750 MG/150ML IV SOLN
750.0000 mg | Freq: Once | INTRAVENOUS | Status: AC
  Administered 2016-02-18: 750 mg via INTRAVENOUS
  Filled 2016-02-18: qty 150

## 2016-02-18 MED ORDER — ONDANSETRON HCL 4 MG/2ML IJ SOLN: 4.0000 mg | Freq: Four times a day (QID) | INTRAMUSCULAR | Status: DC | PRN

## 2016-02-18 MED ORDER — VANCOMYCIN HCL IN DEXTROSE 1-5 GM/200ML-% IV SOLN: 1000.0000 mg | Freq: Three times a day (TID) | INTRAVENOUS | Status: DC

## 2016-02-18 MED ORDER — PREGABALIN 75 MG PO CAPS
150.0000 mg | ORAL_CAPSULE | Freq: Two times a day (BID) | ORAL | Status: DC
  Administered 2016-02-18 – 2016-03-15 (×42): 150 mg via ORAL
  Filled 2016-02-18 (×4): qty 2
  Filled 2016-02-18: qty 6
  Filled 2016-02-18: qty 2
  Filled 2016-02-18: qty 6
  Filled 2016-02-18: qty 2
  Filled 2016-02-18: qty 6
  Filled 2016-02-18 (×9): qty 2
  Filled 2016-02-18: qty 6
  Filled 2016-02-18 (×14): qty 2
  Filled 2016-02-18: qty 3
  Filled 2016-02-18 (×10): qty 2

## 2016-02-18 MED ORDER — IOPAMIDOL (ISOVUE-300) INJECTION 61%
100.0000 mL | Freq: Once | INTRAVENOUS | Status: AC | PRN
  Administered 2016-02-18: 100 mL via INTRAVENOUS

## 2016-02-18 MED ORDER — METOPROLOL TARTRATE 25 MG PO TABS
25.0000 mg | ORAL_TABLET | Freq: Two times a day (BID) | ORAL | Status: DC
  Administered 2016-02-18 – 2016-02-19 (×3): 25 mg via ORAL
  Filled 2016-02-18 (×3): qty 1

## 2016-02-18 MED ORDER — ACETAMINOPHEN 325 MG PO TABS
650.0000 mg | ORAL_TABLET | Freq: Four times a day (QID) | ORAL | Status: DC | PRN
  Administered 2016-02-18: 650 mg via ORAL
  Filled 2016-02-18: qty 2

## 2016-02-18 MED ORDER — HYDROCODONE-ACETAMINOPHEN 5-325 MG PO TABS
1.0000 | ORAL_TABLET | Freq: Once | ORAL | Status: AC
  Administered 2016-02-18: 1 via ORAL
  Filled 2016-02-18: qty 1

## 2016-02-18 MED ORDER — ALUM & MAG HYDROXIDE-SIMETH 200-200-20 MG/5ML PO SUSP: 30.0000 mL | Freq: Three times a day (TID) | ORAL | Status: DC | PRN

## 2016-02-18 NOTE — ED Notes (Signed)
CRITICAL VALUE ALERT  Critical value received:  Lactic acid 3.5  Date of notification:  02/18/16  Time of notification:  [id number]  Critical value read back:Yes.    Nurse who received alert:  RMinter, RN  MD notified (1st page):  Dr. Thurnell Garbe  Time of first page:  1134  MD notified (2nd page):  Time of second page:  Responding MD:  Dr. Thurnell Garbe  Time MD responded:  1134

## 2016-02-18 NOTE — Progress Notes (Signed)
Notified patient family of movement to room 307.  Patient was in room 308, confused, yelling for help, pulling off oxygen tubing, and trying to pull off cardiac monitor.  Patient moved to room 307, mitts applied, another iv started.  Explained to patient wife reason for changing of rooms and patient condition. Will continue to monitor patient.

## 2016-02-18 NOTE — Progress Notes (Signed)
Called by RN to evaluate patient with worsening shortness of breath and persistent tachycardia. Patient admitted this morning with cavitary pneumonia. EKG done this morning showed atrial tachycardia, and his heart rate has ranged from 110 to 130.  Patient takes Lasix 40 mg twice a day at home, which has not been restarted in the hospital, blood pressure is elevated to 168/88. BNP this morning was 50.0  On exam Chest- scattered rhonchi bilaterally  Chest x-ray showed mild interstitial prominence  Plan Lasix 40 mg IV 1 Change IV normal saline KVO Start metoprolol 25 mg twice a day Based on response, consider restarting Lasix in a.m. Echocardiogram has been ordered

## 2016-02-18 NOTE — ED Notes (Signed)
CRITICAL VALUE ALERT  Critical value received:  Trop 0.11  Date of notification:  02/18/2016  Time of notification:  06:44  Critical value read back: yes  Nurse who received alert:  Atilano Median Rn   MD notified (1st page):  Dr Tomi Bamberger  Time of first page:  06:49  MD notified (2nd page):  Time of second page:  Responding MD:  Dr Tomi Bamberger  Time MD responded:  06:49

## 2016-02-18 NOTE — Progress Notes (Signed)
Inpatient Diabetes Program Recommendations  AACE/ADA: New Consensus Statement on Inpatient Glycemic Control (2015)  Target Ranges:  Prepandial:   less than 140 mg/dL      Peak postprandial:   less than 180 mg/dL (1-2 hours)      Critically ill patients:  140 - 180 mg/dL   Results for HERCHELL, MARCEAU (MRN [id number]) as of 02/18/2016 07:59  Ref. Range 02/18/2016 05:17  Glucose Latest Ref Range: 65 - 99 mg/dL 292 (H)   Review of Glycemic Control  Diabetes history: DM2 Outpatient Diabetes medications: None (diet controlled) Current orders for Inpatient glycemic control: None; in ED at this time  Inpatient Diabetes Program Recommendations: Correction (SSI): Initial lab glucose 292 mg/dl and patient has a history of DM2 (diet controlled). If admitted, please consider ordering CBGs with Novolog correction scale ACHS.  HgbA1C: If admitted, please consider ordering an A1C to evaluate glycemic control over the past 2-3 months.  Thanks, Barnie Alderman, RN, MSN, CDE Diabetes Coordinator Inpatient Diabetes Program [phone] (Team Pager from [place] to [place]) [phone] ([facility] office) [phone] [facility] office) [phone] [facility] office)

## 2016-02-18 NOTE — ED Provider Notes (Addendum)
[facility] DEPT Provider Note   CSN: [id number] Arrival date & time: 02/18/16  0448  Time seen 04:55 am   History   Chief Complaint Chief Complaint  Patient presents with  . Hematemesis    HPI Richard Fields is a 75 y.o. male.  HPI patient presents via EMS. EMS reports patient falls a lot and they go out to his house a lot to assist him to get up. They state tonight however when they went out he was noted to have a pulse ox of 89-90% on room air and he was vomiting up dark blood mixed in "sputum". EMS denies fevers coughing. Patient reports he has been "seeing blood when I spit" for the past 1-2 weeks. Patient denies fall tonight but states he was pulling against something to get up and he somehow hurt his right shoulder yesterday. He complains of pain in his right side and in his right shoulder. He had decreased appetite today. He denies cough, nausea, vomiting, diarrhea, fever, or melena. He denies epistaxis.  He states he has been able to walk the past 2 days because his legs are too weak. He states he's been urinating normally until today. He denies any hematuria. When asked if he is on blood thinner patient thinks he was recently started on pradaxa. EMS reports his CBG was 303.   PCP Dr Willey Blade  Past Medical History:  Diagnosis Date  . Cervical disc disease   . Chronic constipation   . Diabetes mellitus without complication ([place])    Borderline diabetic-diet controlled  . Hyperlipidemia   . Hypertension   . Peripheral neuropathy [facility])     Patient Active Problem List   Diagnosis Date Noted  . Hypertension 12/02/2015  . Screening 05/21/2012    Past Surgical History:  Procedure Laterality Date  . COLONOSCOPY  2003   RMR: internal hemorrhoids, otherwise normal  . COLONOSCOPY  05/30/2012   Procedure: COLONOSCOPY;  Surgeon: Daneil Dolin, MD;  Location: [facility] ENDO SUITE;  Service: Endoscopy;  Laterality: N/A;  12:00  . HERNIA REPAIR    . TONSILLECTOMY          Home Medications    Prior to Admission medications   Medication Sig Start Date End Date Taking? Authorizing Provider  AMITIZA 24 MCG capsule Take 2 capsules by mouth daily. 08/14/13   Historical Provider, MD  aspirin 325 MG tablet Take 325 mg by mouth daily.    Historical Provider, MD  atorvastatin (LIPITOR) 10 MG tablet Take 10 mg by mouth daily.  05/10/12   Historical Provider, MD  clobetasol ointment (TEMOVATE) [id number] % Apply 1 application topically as needed. 07/20/13   Historical Provider, MD  fluticasone (CUTIVATE) 0.05 % cream Apply 1 application topically as needed. 07/20/13   Historical Provider, MD  furosemide (LASIX) 40 MG tablet Take 1 tablet (40 mg total) by mouth 2 (two) times daily. 10/12/15   Davonna Belling, MD  HYDROcodone-acetaminophen (NORCO) 7.5-325 MG tablet Take 1 tablet by mouth every 6 (six) hours as needed for moderate pain (Must last 30 days.Do not drive or operate machinery while taking this medicine.). 01/14/16   Sanjuana Kava, MD  ketoconazole (NIZORAL) 2 % cream Apply 1 application topically as needed. 07/26/13   Historical Provider, MD  lisinopril (PRINIVIL,ZESTRIL) 10 MG tablet Take 10 mg by mouth daily.  05/10/12   Historical Provider, MD  LORazepam (ATIVAN) 2 MG tablet Take 2 mg by mouth at bedtime.    Historical Provider, MD  meloxicam (MOBIC) 7.5  MG tablet Take 1 tablet by mouth daily. 07/28/14   Historical Provider, MD  mirtazapine (REMERON) 15 MG tablet Take 7.5 mg by mouth at bedtime. 08/18/14   Historical Provider, MD  polyethylene glycol powder (GLYCOLAX/MIRALAX) powder Take 17 g by mouth 2 (two) times daily.  05/04/12   Historical Provider, MD  polyethylene glycol-electrolytes (TRILYTE) 420 G solution Take 4,000 mLs by mouth as directed. 05/21/12   Daneil Dolin, MD  potassium chloride SA (K-DUR,KLOR-CON) 20 MEQ tablet Take 0.5 tablets by mouth daily. 08/26/13   Historical Provider, MD  pregabalin (LYRICA) 150 MG capsule Take 150 mg by mouth 2 (two) times  daily.    Historical Provider, MD  traZODone (DESYREL) 50 MG tablet Take 1 tablet by mouth at bedtime. 07/28/14   Historical Provider, MD    Family History Family History  Problem Relation Age of Onset  . Cancer Father   . Colon cancer Paternal Uncle   . Diabetes Brother     Social History Social History  Substance Use Topics  . Smoking status: Current Every Day Smoker    Years: 40.00  . Smokeless tobacco: Former Systems developer    Types: Chew  . Alcohol use No  lives at home Lives with spouse   Allergies   Penicillins and Tegretol [carbamazepine]   Review of Systems Review of Systems  All other systems reviewed and are negative.    Physical Exam Updated Vital Signs BP 158/93   Pulse 114   Temp 98.5 F (36.9 C) (Oral)   Resp (!) 36   Ht 5\' 11"  (1.803 m)   Wt 285 lb (129.3 kg)   SpO2 90%   BMI 39.75 kg/m   Vital signs normal except tachycardia, borderline hypoxia   Physical Exam  Constitutional: He is oriented to person, place, and time. He appears well-developed and well-nourished.  Non-toxic appearance. He does not appear ill. No distress.  HENT:  Head: Normocephalic and atraumatic.  Right Ear: External ear normal.  Left Ear: External ear normal.  Nose: Nose normal. No mucosal edema or rhinorrhea.  Mouth/Throat: Mucous membranes are normal. No dental abscesses or uvula swelling.  Mucous membranes are dry, he has some dried blood on the dorsum of his tongue. I do not see any active bleeding in his oral pharynx.  Eyes: Conjunctivae and EOM are normal. Pupils are equal, round, and reactive to light.  Neck: Normal range of motion and full passive range of motion without pain. Neck supple.  Cardiovascular: Normal rate, regular rhythm and normal heart sounds.  Exam reveals no gallop and no friction rub.   No murmur heard. Pulmonary/Chest: Effort normal and breath sounds normal. No respiratory distress. He has no wheezes. He has no rhonchi. He has no rales. He exhibits no  tenderness and no crepitus.  I cannot locate any chest pain to palpation  Abdominal: Soft. Normal appearance and bowel sounds are normal. He exhibits no distension. There is no tenderness. There is no rebound and no guarding.  I cannot locate any abdominal pain to palpation  Genitourinary:  Genitourinary Comments: There is no CVA tenderness bilaterally  Musculoskeletal: Normal range of motion. He exhibits no edema or tenderness.  Moves all extremities well.   Neurological: He is alert and oriented to person, place, and time. He has normal strength. No cranial nerve deficit.  Skin: Skin is warm, dry and intact. No rash noted. There is erythema. No pallor.  Chronic venous stasis changes of both lower legs to the level the  ankle  Psychiatric: He has a normal mood and affect. His speech is normal and behavior is normal. His mood appears not anxious.  Nursing note and vitals reviewed.    ED Treatments / Results  Labs (all labs ordered are listed, but only abnormal results are displayed) Results for orders placed or performed during the hospital encounter of 02/18/16  CBC with Differential  Result Value Ref Range   WBC 19.9 (H) 4.0 - 10.5 K/uL   RBC 4.73 4.22 - 5.81 MIL/uL   Hemoglobin 14.7 13.0 - 17.0 g/dL   HCT 44.6 39.0 - 52.0 %   MCV 94.3 78.0 - 100.0 fL   MCH 31.1 26.0 - 34.0 pg   MCHC 33.0 30.0 - 36.0 g/dL   RDW 12.9 11.5 - 15.5 %   Platelets 298 150 - 400 K/uL   Neutrophils Relative % 85 %   Neutro Abs 17.0 (H) 1.7 - 7.7 K/uL   Lymphocytes Relative 5 %   Lymphs Abs 0.9 0.7 - 4.0 K/uL   Monocytes Relative 10 %   Monocytes Absolute 2.0 (H) 0.1 - 1.0 K/uL   Eosinophils Relative 0 %   Eosinophils Absolute 0.0 0.0 - 0.7 K/uL   Basophils Relative 0 %   Basophils Absolute 0.0 0.0 - 0.1 K/uL  D-dimer, quantitative  Result Value Ref Range   D-Dimer, Quant 0.92 (H) 0.00 - 0.50 ug/mL-FEU  Comprehensive metabolic panel  Result Value Ref Range   Sodium 135 135 - 145 mmol/L    Potassium 4.2 3.5 - 5.1 mmol/L   Chloride 98 (L) 101 - 111 mmol/L   CO2 28 22 - 32 mmol/L   Glucose, Bld 292 (H) 65 - 99 mg/dL   BUN 17 6 - 20 mg/dL   Creatinine, Ser 0.88 0.61 - 1.24 mg/dL   Calcium 9.1 8.9 - 10.3 mg/dL   Total Protein 8.2 (H) 6.5 - 8.1 g/dL   Albumin 3.5 3.5 - 5.0 g/dL   AST 19 15 - 41 U/L   ALT 20 17 - 63 U/L   Alkaline Phosphatase 111 38 - 126 U/L   Total Bilirubin 0.9 0.3 - 1.2 mg/dL   GFR calc non Af Amer >60 >60 mL/min   GFR calc Af Amer >60 >60 mL/min   Anion gap 9 5 - 15  Troponin I  Result Value Ref Range   Troponin I 0.11 (HH) <0.03 ng/mL  Brain natriuretic peptide  Result Value Ref Range   B Natriuretic Peptide 50.0 0.0 - 100.0 pg/mL  Lipase, blood  Result Value Ref Range   Lipase 30 11 - 51 U/L  POC occult blood, ED RN will collect  Result Value Ref Range   Fecal Occult Bld NEGATIVE NEGATIVE   Laboratory interpretation all normal except Elevated troponin, leukocytosis    EKG  EKG Interpretation  Date/Time:  Thursday February 18 2016 05:15:32 EDT Ventricular Rate:  113 PR Interval:    QRS Duration: 86 QT Interval:  367 QTC Calculation: 504 R Axis:   44 Text Interpretation:  Sinus or ectopic atrial tachycardia Borderline T wave abnormalities Prolonged QT interval No old tracing to compare Confirmed by Ameka Krigbaum  MD-I, Jerel Sardina ([id number]) on 02/18/2016 5:18:53 AM       Radiology Dg Chest Port 1 View  Result Date: 02/18/2016 CLINICAL DATA:  75 year old male with shortness of breath and hemoptysis. EXAM: PORTABLE CHEST 1 VIEW COMPARISON:  Chest radiograph dated 08/20/2014 FINDINGS: There is shallow inspiration. Mild diffuse interstitial prominence and crowding as well as  mild bronchiectatic changes. Right lung base hazy density likely atelectatic changes. Developing infiltrate is not excluded. Clinical correlation is recommended. There is no pleural effusion or pneumothorax. Stable mild cardiomegaly. No acute osseous pathology. IMPRESSION: Right lung  base atelectatic changes versus less likely infiltrate. Clinical correlation is recommended. Electronically Signed   By: Anner Crete M.D.   On: 02/18/2016 06:23   Dg Shoulder Right Portable  Result Date: 02/18/2016 CLINICAL DATA:  Initial evaluation for acute right shoulder pain. No injury. EXAM: PORTABLE RIGHT SHOULDER COMPARISON:  None. FINDINGS: No acute fracture or dislocation. Humeral head in normal alignment with the glenoid. AC joint approximated. Degenerative osteoarthritic changes present about the [facility] joint. No periarticular calcification. Osseous mineralization normal. No soft tissue abnormality. IMPRESSION: 1. No acute osseous abnormality about the right shoulder. 2. 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 Electronically Signed   By: Jeannine Boga M.D.   On: 02/18/2016 06:24    Procedures Procedures (including critical care time)  Medications Ordered in ED Medications  levofloxacin (LEVAQUIN) IVPB 750 mg (not administered)  HYDROcodone-acetaminophen (NORCO/VICODIN) 5-325 MG per tablet 1 tablet (1 tablet Oral Given 02/18/16 0546)  fentaNYL (SUBLIMAZE) injection 50 mcg (50 mcg Intravenous Given 02/18/16 0605)     Initial Impression / Assessment and Plan / ED Course  I have reviewed the triage vital signs and the nursing notes.  Pertinent labs & imaging results that were available during my care of the patient were reviewed by me and considered in my medical decision making (see chart for details).  Clinical Course   Laboratory testing was ordered. Portable chest x-ray and  portable shoulder films were done. At this point I did not feel like patient should leave the department to go to radiology. It still isn't clear to me whether he is coughing up bloody sputum or if he is vomiting blood or if he is spitting up blood from some abnormality in his oral pharynx.  5:55 AM caregiver is here with the wife (who has memory problems). She  states both the patient and his spouse have been using way too many goodies powders. She states last week they went through a packet of 50 doses in 2 days between the 2 of them. They state he has been coughing up blood for several weeks and was unable to come get a chest x-ray last week because he was too weak. His PCP is aware of this. He normally uses a walker and he has been getting progressively weaker until he is having difficulty getting up tonight.  The etiology of his pain is not clear. He has shoulder pain which with the abuse of the aspirin products raises concern for acute gastric perforation however his pain is in the right shoulder and he does not have an acute abdomen by exam. Also to consider because of  his age he could have a AAA with some atypical pain. He could have a renal stone with right-sided discomfort. He also could be constipated due to the chronic narcotics he is on, he takes hydrocodone every 4 hours for leg pain according to his family. Therefore CT of the abdomen renal study was done.  Recheck at 06:05 AM Pt states he is still having pain, indicates his right abdomen/side, nontender to palpation. Given pain meds and discussed getting CT scan.  6:50 AM I reviewed patient's abdominal renal CT scan. There is no AAA seen, I do not see any obvious renal stones, he does have a lot  of stool in the right ascending colon, however he also has some type of infiltrate seen on the right lower lobe of the lung. Patient has a history smoking in the past. He was started on IV Levaquin, patient has penicillin allergy, for possible community-acquired pneumonia. CT angiogram the chest was done due to elevated d-dimer and also look at this area closer. Hopefully he does not have underlying lung cancer causing postobstructive pneumonia. Discussed his Renal CT with patient (still waiting for radiology to read) and agreeable to getting chest CT scan.   Pt turned over to Dr Thurnell Garbe at change of shift  to get results of his CT scans at 07:15 AM  07:25 AM Radiology called CT renal scan suggestive of ischemic bowel.   Review of the [place] shows no entries however in his Epic notes he received #180 hydrocodone 7.5/325 on September 14 and on July 31 signed by Wellington Hampshire for Dr Luna Glasgow  Final Clinical Impressions(s) / ED Diagnoses   Final diagnoses:  Acute pain of right shoulder  Abdominal pain, right lateral  Hemoptysis  Aspirin long-term use    Disposition pending   Rolland Porter, MD, Barbette Or, MD 02/18/16 [id number]    Rolland Porter, MD 02/18/16 [phone]

## 2016-02-18 NOTE — ED Notes (Signed)
Delay in taking patient upstair. Nurse upstairs is getting patient a bariatric bed so that patient has to make one transfer.

## 2016-02-18 NOTE — ED Notes (Signed)
MD at bedside. 

## 2016-02-18 NOTE — ED Notes (Signed)
Hospitalist at bedside 

## 2016-02-18 NOTE — ED Triage Notes (Signed)
Pt c/o severe abd pain, vomiting blood and weakness. Pt was 89% on room air when ems arrived. Pt blood sugar was 303.

## 2016-02-18 NOTE — H&P (Signed)
History and Physical    Richard Fields [id number] DOB: November 11, 1940 DOA: 02/18/2016  PCP: Asencion Noble, MD   Patient coming from: Home   Chief Complaint: Hemoptysis   HPI: Richard Fields is a 75 y.o. male with a past medical history significant for DM, HLD, HTN, and peripheral neuropathy, who presents by ambulance with complaints of blackish/brownish hemoptysis that onset 1 month ago. Very poor historian. H/o mainly obtained by caregiver and wife at bedside. Per wife he has associated right shoulder pain and profuse diaphoresis overnight. He denies any fever or chills. While in the ED, he was started on vancomycin and admitted for further evaluation of cavitary pneumonia.   ED Course: Glucose 292, Troponin 0.11, WBC 19.9, D-dimer 0.92, CXR shows right lung base atelectatic versus infiltrate. CT Chest: RLL ATX surrounding a non-enhancing cavitary lesion, likely a cavitary PNA. CTA Abdomen: pneumatosis of the transverse colon and a segment of the descending colon. No free intraperitoneal air.  Review of Systems: As per HPI otherwise 10 point review of systems negative.   Past Medical History:  Diagnosis Date  . Cervical disc disease   . Chronic constipation   . Diabetes mellitus without complication ([place])    Borderline diabetic-diet controlled  . Hyperlipidemia   . Hypertension   . Peripheral neuropathy [facility])     Past Surgical History:  Procedure Laterality Date  . COLONOSCOPY  2003   RMR: internal hemorrhoids, otherwise normal  . COLONOSCOPY  05/30/2012   Procedure: COLONOSCOPY;  Surgeon: Daneil Dolin, MD;  Location: [facility];  Service: Endoscopy;  Laterality: N/A;  12:00  . HERNIA REPAIR    . TONSILLECTOMY     Family History:  reports that he has been smoking.  He has smoked for the past 40.00 years. He has quit using smokeless tobacco. His smokeless tobacco use included Chew. He reports that he does not drink alcohol or use drugs.  Allergies  Allergen Reactions    . Penicillins Other (See Comments)    Unknown-reaction is unknown  . Tegretol [Carbamazepine] Itching    Family History  Problem Relation Age of Onset  . Cancer Father   . Colon cancer Paternal Uncle   . Diabetes Brother     Prior to Admission medications   Medication Sig Start Date End Date Taking? Authorizing Provider  HYDROcodone-acetaminophen (NORCO) 7.5-325 MG tablet Take 1 tablet by mouth every 6 (six) hours as needed for moderate pain (Must last 30 days.Do not drive or operate machinery while taking this medicine.). 01/14/16  Yes Sanjuana Kava, MD  AMITIZA 24 MCG capsule Take 2 capsules by mouth daily. 08/14/13   Historical Provider, MD  aspirin 325 MG tablet Take 325 mg by mouth daily.    Historical Provider, MD  atorvastatin (LIPITOR) 10 MG tablet Take 10 mg by mouth daily.  05/10/12   Historical Provider, MD  clobetasol ointment (TEMOVATE) [id number] % Apply 1 application topically as needed. 07/20/13   Historical Provider, MD  fluticasone (CUTIVATE) 0.05 % cream Apply 1 application topically as needed. 07/20/13   Historical Provider, MD  furosemide (LASIX) 40 MG tablet Take 1 tablet (40 mg total) by mouth 2 (two) times daily. 10/12/15   Davonna Belling, MD  ketoconazole (NIZORAL) 2 % cream Apply 1 application topically as needed. 07/26/13   Historical Provider, MD  lisinopril (PRINIVIL,ZESTRIL) 10 MG tablet Take 10 mg by mouth daily.  05/10/12   Historical Provider, MD  LORazepam (ATIVAN) 2 MG tablet Take 2 mg by mouth at  bedtime.    Historical Provider, MD  meloxicam (MOBIC) 7.5 MG tablet Take 1 tablet by mouth daily. 07/28/14   Historical Provider, MD  mirtazapine (REMERON) 15 MG tablet Take 7.5 mg by mouth at bedtime. 08/18/14   Historical Provider, MD  polyethylene glycol powder (GLYCOLAX/MIRALAX) powder Take 17 g by mouth 2 (two) times daily.  05/04/12   Historical Provider, MD  polyethylene glycol-electrolytes (TRILYTE) 420 G solution Take 4,000 mLs by mouth as directed. 05/21/12   Daneil Dolin, MD  potassium chloride SA (K-DUR,KLOR-CON) 20 MEQ tablet Take 0.5 tablets by mouth daily. 08/26/13   Historical Provider, MD  pregabalin (LYRICA) 150 MG capsule Take 150 mg by mouth 2 (two) times daily.    Historical Provider, MD  traZODone (DESYREL) 50 MG tablet Take 1 tablet by mouth at bedtime. 07/28/14   Historical Provider, MD    Physical Exam: Vitals:   02/18/16 1130 02/18/16 1200 02/18/16 1313 02/18/16 1400  BP: 149/68 155/83 135/65   Pulse: 107 108 (!) 106   Resp: (!) 29 (!) 34 (!) 28   Temp:   100 F (37.8 C)   TempSrc:   Axillary   SpO2: 94% 93% 95% 92%  Weight:   (!) 165.6 kg (365 lb)   Height:   5\' 11"  (1.803 m)       Constitutional: NAD, calm, comfortable, obese Vitals:   02/18/16 1130 02/18/16 1200 02/18/16 1313 02/18/16 1400  BP: 149/68 155/83 135/65   Pulse: 107 108 (!) 106   Resp: (!) 29 (!) 34 (!) 28   Temp:   100 F (37.8 C)   TempSrc:   Axillary   SpO2: 94% 93% 95% 92%  Weight:   (!) 165.6 kg (365 lb)   Height:   5\' 11"  (1.803 m)    Eyes: PERRL, lids and conjunctivae normal ENMT: Mucous membranes are moist. Posterior pharynx clear of any exudate or lesions. poor dentition.  Neck: normal, supple, no masses, no thyromegaly Respiratory: Breath sounds are difficult to determine due to body type. He has mild crackles in the lungs, no wheezing.  Normal respiratory effort. No accessory muscle use.  Cardiovascular: Tachycardic rate but regular, no murmurs / rubs / gallops. 1+ bilateral lower extremity edema,  2+ pedal pulses. No carotid bruits.  Abdomen: no tenderness, no masses palpated. No hepatosplenomegaly. Bowel sounds positive.  Musculoskeletal: no clubbing / cyanosis. No joint deformity upper and lower extremities. Good ROM, no contractures. Normal muscle tone.  Skin: Chronic venous insufficiency skin color changes, no rashes, lesions, ulcers. No induration Neurologic: CN 2-12 grossly intact. Sensation intact, DTR normal. Strength 5/5 in all 4.    Psychiatric: Unable to fully assess as patient appears confused and is a bad historian.  Labs on Admission: I have personally reviewed following labs and imaging studies  CBC:  Recent Labs Lab 02/18/16 0517  WBC 19.9*  NEUTROABS 17.0*  HGB 14.7  HCT 44.6  MCV 94.3  PLT [id number]   Basic Metabolic Panel:  Recent Labs Lab 02/18/16 0517  NA 135  K 4.2  CL 98*  CO2 28  GLUCOSE 292*  BUN 17  CREATININE 0.88  CALCIUM 9.1   GFR: Estimated Creatinine Clearance: 110.8 mL/min (by C-G formula based on SCr of 0.88 mg/dL). Liver Function Tests:  Recent Labs Lab 02/18/16 0517  AST 19  ALT 20  ALKPHOS 111  BILITOT 0.9  PROT 8.2*  ALBUMIN 3.5    Recent Labs Lab 02/18/16 0517  LIPASE 30  No results for input(s): AMMONIA in the last 168 hours. Coagulation Profile:  Recent Labs Lab 02/18/16 0517  INR 1.08   Cardiac Enzymes:  Recent Labs Lab 02/18/16 0517  TROPONINI 0.11*   BNP (last 3 results) No results for input(s): PROBNP in the last 8760 hours. HbA1C: No results for input(s): HGBA1C in the last 72 hours. CBG: No results for input(s): GLUCAP in the last 168 hours. Lipid Profile: No results for input(s): CHOL, HDL, LDLCALC, TRIG, CHOLHDL, LDLDIRECT in the last 72 hours. Thyroid Function Tests: No results for input(s): TSH, T4TOTAL, FREET4, T3FREE, THYROIDAB in the last 72 hours. Anemia Panel: No results for input(s): VITAMINB12, FOLATE, FERRITIN, TIBC, IRON, RETICCTPCT in the last 72 hours. Urine analysis:    Component Value Date/Time   COLORURINE YELLOW 02/18/2016 1129   APPEARANCEUR CLEAR 02/18/2016 1129   LABSPEC 1.015 02/18/2016 1129   PHURINE 6.0 02/18/2016 1129   GLUCOSEU 500 (A) 02/18/2016 1129   HGBUR NEGATIVE 02/18/2016 1129   BILIRUBINUR NEGATIVE 02/18/2016 1129   KETONESUR 15 (A) 02/18/2016 1129   PROTEINUR TRACE (A) 02/18/2016 1129   UROBILINOGEN 0.2 08/20/2014 1257   NITRITE NEGATIVE 02/18/2016 1129   LEUKOCYTESUR NEGATIVE 02/18/2016  1129   Sepsis Labs: !!!!!!!!!!!!!!!!!!!!!!!!!!!!!!!!!!!!!!!!!!!! @LABRCNTIP (procalcitonin:4,lacticidven:4) ) Recent Results (from the past 240 hour(s))  Blood culture (routine x 2)     Status: None (Preliminary result)   Collection Time: 02/18/16  7:36 AM  Result Value Ref Range Status   Specimen Description BLOOD RIGHT HAND  Final   Special Requests   Final    BOTTLES DRAWN AEROBIC AND ANAEROBIC AEB=8CC ANA=6CC   Culture NO GROWTH <12 HOURS  Final   Report Status PENDING  Incomplete  Blood culture (routine x 2)     Status: None (Preliminary result)   Collection Time: 02/18/16  7:42 AM  Result Value Ref Range Status   Specimen Description BLOOD LEFT ANTECUBITAL  Final   Special Requests BOTTLES DRAWN AEROBIC AND ANAEROBIC 6CC  Final   Culture NO GROWTH <12 HOURS  Final   Report Status PENDING  Incomplete     Radiological Exams on Admission: Ct Angio Chest Pe W/cm &/or Wo Cm  Result Date: 02/18/2016 CLINICAL DATA:  Hemoptysis for 2-3 days.  Back pain. EXAM: CT ANGIOGRAPHY CHEST WITH CONTRAST TECHNIQUE: Multidetector CT imaging of the chest was performed using the standard protocol during bolus administration of intravenous contrast. Multiplanar CT image reconstructions and MIPs were obtained to evaluate the vascular anatomy. CONTRAST:  100 cc Isovue 370 intravenous COMPARISON:  None. FINDINGS: Cardiovascular: CTA of the pulmonary arteries is limited by motion and suboptimal opacification. Apparent filling defect in a subsegmental left upper lobe branch, 6:121, is an area of motion and favored artifactual. The low-density is also not at a branch point. No filling defect is identified. No cardiomegaly or pericardial effusion. No acute aortic finding. Diffuse atherosclerotic calcification of the aorta and coronaries. Mediastinum/Nodes: Prominent right paratracheal lymph node of 11 mm short axis, incidental in isolation. Lungs/Pleura: There is atelectasis at the right base, multi segment, with  a ~ 3 cm nonenhancing area in the lower lobe that has central lucency. There is small right pleural effusion that is small, with mild posterior chest loculation. No edema. Upper Abdomen: Hepatic steatosis Musculoskeletal: Degenerative changes without acute or aggressive finding. Review of the MIP images confirms the above findings. IMPRESSION: 1. Limited CTA with no definitive pulmonary embolism. There is an apparent filling defect within a subsegmental left upper lobe branch, but favored artifactual.  Consider lower extremity Dopplers. 2. Right lower lobe atelectasis surrounding a nonenhancing cavitary focus, likely a cavitary pneumonia. Right pleural effusion is small but has areas of early loculation. 3. After completed treatment, recommend CT follow-up. Electronically Signed   By: Monte Fantasia M.D.   On: 02/18/2016 07:44   Dg Chest Port 1 View  Result Date: 02/18/2016 CLINICAL DATA:  75 year old male with shortness of breath and hemoptysis. EXAM: PORTABLE CHEST 1 VIEW COMPARISON:  Chest radiograph dated 08/20/2014 FINDINGS: There is shallow inspiration. Mild diffuse interstitial prominence and crowding as well as mild bronchiectatic changes. Right lung base hazy density likely atelectatic changes. Developing infiltrate is not excluded. Clinical correlation is recommended. There is no pleural effusion or pneumothorax. Stable mild cardiomegaly. No acute osseous pathology. IMPRESSION: Right lung base atelectatic changes versus less likely infiltrate. Clinical correlation is recommended. Electronically Signed   By: Anner Crete M.D.   On: 02/18/2016 06:23   Dg Shoulder Right Portable  Result Date: 02/18/2016 CLINICAL DATA:  Initial evaluation for acute right shoulder pain. No injury. EXAM: PORTABLE RIGHT SHOULDER COMPARISON:  None. FINDINGS: No acute fracture or dislocation. Humeral head in normal alignment with the glenoid. AC joint approximated. Degenerative osteoarthritic changes present about the  [facility] joint. No periarticular calcification. Osseous mineralization normal. No soft tissue abnormality. IMPRESSION: 1. No acute osseous abnormality about the right shoulder. 2. 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 Electronically Signed   By: Jeannine Boga M.D.   On: 02/18/2016 06:24   Ct Renal Stone Study  Result Date: 02/18/2016 CLINICAL DATA:  Acute right flank pain. EXAM: CT ABDOMEN AND PELVIS WITHOUT CONTRAST TECHNIQUE: Multidetector CT imaging of the abdomen and pelvis was performed following the standard protocol without IV contrast. However, exam is somewhat limited due to respiratory motion artifact. COMPARISON:  None. FINDINGS: Lower chest: Right posterior basilar atelectasis or inflammation is noted with possible associated pleural effusion. Left lung base is clear. Hepatobiliary: No gallstones are noted. Possible mild fatty infiltration is noted of the liver. Pancreas: Normal. Spleen: Normal. Adrenals/Urinary Tract: Adrenal glands and kidneys are unremarkable. No hydronephrosis or renal obstruction is noted. No renal or ureteral calculi are noted. Urinary bladder appears normal. Stomach/Bowel: There is no evidence of bowel obstruction. However, there does appear to be pneumatosis involving a portion of the transverse colon as well as a portion of the proximal descending colon. This potentially may represent benign pneumatosis, but ischemic bowel must be considered. Vascular/Lymphatic: Atherosclerosis of abdominal aorta is noted without aneurysm formation. No significant adenopathy is noted. Reproductive: Normal prostate gland. Other: No abnormal fluid collection is noted. 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 Possible pars defects is seen at L5. Well-defined lucencies are noted in the L3 and L4 vertebral bodies which may represent hemangiomas, but metastatic disease cannot be excluded. IMPRESSION: No  hydronephrosis or renal obstruction is noted. No renal or ureteral calculi are noted. Right posterior basilar atelectasis or inflammation is noted with possible associated pleural effusion. Probable mild fatty infiltration of the liver. Aortic atherosclerosis. Well-defined lucencies are noted in the L3 and L4 vertebral bodies which may represent hemangiomas, but MRI with and without gadolinium is recommended of the lumbar spine to rule out malignancy or metastatic disease. Pneumatosis is noted in the transverse colon as well as proximal descending colon. While this may represent benign pneumatosis, ischemic bowel cannot be excluded and clinical correlation and surgical consultation is recommended. Critical Value/emergent results were called by telephone  at the time of interpretation on 02/18/2016 at 7:24 am to Dr. Rolland Porter , who verbally acknowledged these results. Electronically Signed   By: Marijo Conception, M.D.   On: 02/18/2016 07:25   Ct Angio Abd/pel W And/or Wo Contrast  Addendum Date: 02/18/2016   ADDENDUM REPORT: 02/18/2016 12:41 ADDENDUM: Small right pleural effusion with compressive atelectasis at the right lung base. There is a small focus of low-density and gas within this collapsed right lower lobe. This is seen on sequence 5, image 9. This area measures roughly 2.2 cm. This could represent a small focus of lung necrosis or cavitary pneumonia. This was seen on the recent chest CT from 02/18/2016. Electronically Signed   By: Markus Daft M.D.   On: 02/18/2016 12:41   Result Date: 02/18/2016 CLINICAL DATA:  Follow-up colonic pneumatosis. EXAM: CT ANGIOGRAPHY ABDOMEN AND PELVIS WITH CONTRAST AND WITHOUT CONTRAST TECHNIQUE: Multidetector CT imaging of the abdomen and pelvis was performed using the standard protocol during bolus administration of intravenous contrast. Multiplanar reconstructed images and MIPs were obtained and reviewed to evaluate the vascular anatomy. CONTRAST:  180mL ISOVUE-300  IOPAMIDOL (ISOVUE-300) INJECTION 61% COMPARISON:  CT 02/18/2016 FINDINGS: VASCULAR Aorta: Poor opacification of the arterial structures due to technical difficulties with this examination. Normal caliber of the abdominal aorta with atherosclerotic disease. No evidence for an aortic dissection. Celiac: Celiac trunk is patent without significant stent stenosis at the origin. The main branch vessels are patent. SMA: The SMA is patent with some atherosclerotic plaque at the origin. There does not appear to be a critical stenosis but limited evaluation. Renals: Bilateral renal arteries are patent with calcified plaque at the origin. There does not appear to be critical stenosis but limited evaluation. IMA: Proximal IMA appears to be patent. Inflow: Iliac arteries are calcified without significant aneurysm. Iliac arteries and proximal femoral arteries appear to be patent. Veins: IVC, renal veins and portal venous system are patent. Review of the MIP images confirms the above findings. NON-VASCULAR Lower chest: Again noted is atelectasis and/or scarring at the left lung base. Small right pleural effusion with compressive atelectasis in the right lower lobe. Coronary artery calcifications. Hepatobiliary: Normal appearance of the liver, gallbladder and portal venous system. Pancreas: Normal appearance of the pancreas without inflammation or duct dilatation. Spleen: Normal appearance of spleen without enlargement. Adrenals/Urinary Tract: Normal adrenal glands. Probable cyst in the left kidney interpolar region without hydronephrosis. Normal appearance of the right kidney without hydronephrosis. Moderate distention of the urinary bladder without gross abnormality. Stomach/Bowel: Again noted is pneumatosis involving the transverse colon and a segment of the descending colon. There is no significant colonic wall thickening. No evidence for small bowel dilatation or obstruction. Cecum is located in the right upper abdomen and  difficult to exclude some pneumatosis involving the cecum. Lymphatic: No significant lymphadenopathy in the abdomen or pelvis. Reproductive: Prostate is unremarkable. Other: No evidence for abdominal or pelvic ascites. There is no free intraperitoneal air. No evidence for portal gas. 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 There is a large lucency involving the L4 vertebral body which may be related to a Schmorl's node. There is also large low-density structure in the posterior aspect of the L3 vertebral body which could be related to a large Schmorl's node. IMPRESSION: VASCULAR Limited evaluation of the aorta and visceral arteries due to poor contrast opacification and technical issues with this examination. No gross abnormality to the aorta such as an aneurysm or dissection.  Main visceral arteries appear to be patent without significant stenosis. NON-VASCULAR Stable colonic pneumatosis. The pneumatosis is involving the transverse colon and a segment of the descending colon. No significant change from the previous examination and etiology is unknown. No focal bowel wall thickening. No free intraperitoneal air. Again noted are lucent lesions within the L3 and L4 vertebral bodies. These could be related to very large Schmorl's nodes but indeterminate. Electronically Signed: By: Markus Daft M.D. On: 02/18/2016 12:22    EKG: Independently reviewed. EKG shows sinus or ectopic atrial tachycardia.   Assessment/Plan Principal Problem:   Cavitary pneumonia Active Problems:   Hemoptysis   Morbid obesity ([place])   Pneumatosis intestinalis  Cavitary Pneumonia/Hemoptysis  -Patient presented with hemoptysis and right shoulder pain likely from  phrenic nerve irritation due to RLL cavitary pneumonia.  - The patient is afebrile with an elevated WBC of 19.9  -Follow blood and sputum cultures.   -Will request a speech therapy evaluation due to potential aspiration. -Start  Vanc and primaxin to cover both staph aureus and aspiration pathogens that are commonly seen in cavitary PNA. -No risk factors for TB, plus RLL location makes TB less likely.  Elevated troponin - Initial troponin 0.11.  -Will serial troponin.  -He denies any chest pain at this time.  -EKG shows sinus or ectopic atrial tachycardia.  -Will request ECHO: as long as EF normal and no wall motion abnormalities do not anticipate further inpatient cardiac work up.  Diabetes Mellitus  - Blood sugars are elevated.  -Start SSI with Novolog.  -hold metformin, especially given dye load administered for CT scan today.  HTN  - Pressures are stable at this time.  - Will continue home dose of losartan.  HLD -Continue statin.    Dr. Willey Blade to assume care in am.   DVT prophylaxis: SQ heparin Code Status: Full code Family Communication: Discussed with the patient, wife and caregiver at bedside Disposition Plan: Home once improved.  Consults called: General surgery  Admission status: Inpatient    Domingo Mend, MD [facility] If 7PM-7AM, please contact night-coverage [email or web address] Password [email or web address]  02/18/2016, 2:01 PM    By signing my name below, I, Collene Leyden, attest that this documentation has been prepared under the direction and in the presence of Domingo Mend MD. Electronically signed: Collene Leyden, Scribe. 02/18/16 12:10 PM   I have reviewed the above documentation for accuracy and completeness, and I agree with the above.  Domingo Mend, MD [facility] Pager: [phone]

## 2016-02-18 NOTE — ED Provider Notes (Signed)
0730:  Pt received at sign out. Pt c/o generalized weakness, frequent falling, hypoxia, question hematemesis vs hemoptysis. Hypoxic on R/A, improved to low 90's on O2 N/C. CT-A chest without PE, +cavitary pneumonia. CT renal with possible pneumatosis. 0740:  T/C to General Surgery Dr. Rosana Hoes, case discussed, including:  HPI, pertinent PM/SHx, VS/PE, dx testing, ED course and treatment:  Will review CT scan images and call back.  0805:  T/C to [facility] Dr. Jerilee Hoh, case discussed, including:  HPI, pertinent PM/SHx, VS/PE, dx testing, ED course and treatment:  Agreeable to admit, requests to add IV vancomycin, write temporary orders, obtain inpt tele bed to Dr. Ria Comment service. 0830:   T/C to General Surgery Dr. Rosana Hoes, case discussed, including:  HPI, pertinent PM/SHx, VS/PE, dx testing, ED course and treatment:  Has reviewed images and chart, pneumatosis can occur with coughing/vomiting, lactic acid is reassuring as well as bowel wall without edema, cannot completely exclude ischemic bowel as CT was without contrast; agreeable to consult. Will obtain CT-A abd/pelvis; Dr. Jerilee Hoh aware.     Francine Graven, DO 02/18/16 1235

## 2016-02-18 NOTE — Progress Notes (Addendum)
Pharmacy Antibiotic Note  Richard Fields is a 75 y.o. male who is morbidly obese, admitted on 02/18/2016 with pneumonia.  Pharmacy has been consulted for VANCOMYCIN and PRIMAXIN dosing.  Pt is morbidly obese, will use NORMALIZED CLCR.  Plan: Vancomycin 1500mg  iv x 1 then 1000mg  IV q8h (goal trough 15-20) Check trough at SS Primaxin 500mg  IV q6hrs Monitor labs, progress, c/s  Height: 5\' 11"  (180.3 cm) Weight: 285 lb (129.3 kg) IBW/kg (Calculated) : 75.3  Temp (24hrs), Avg:98.4 F (36.9 C), Min:98.3 F (36.8 C), Max:98.5 F (36.9 C)   Recent Labs Lab 02/18/16 0517 02/18/16 0736  WBC 19.9*  --   CREATININE 0.88  --   LATICACIDVEN  --  1.9    Estimated Creatinine Clearance: 96.3 mL/min (by C-G formula based on SCr of 0.88 mg/dL).    Allergies  Allergen Reactions  . Penicillins Other (See Comments)    Unknown-reaction is unknown  . Tegretol [Carbamazepine] Itching   Antimicrobials this admission: Vancomycin 10/19 >>   Dose adjustments this admission:  Microbiology results: 02/18/16  BCx: pending  UCx:    Sputum:    MRSA PCR:   Thank you for allowing pharmacy to be a part of this patient's care.  Hart Robinsons A 02/18/2016 11:31 AM

## 2016-02-18 NOTE — ED Notes (Signed)
Pt returned from ct,  

## 2016-02-18 NOTE — Progress Notes (Signed)
Called MD to floor to assess patient.  Patient has been SOB since beginning of shift, and has been tachycardic on monitor.  Heart rate has been irregular in the the 110's to 120s, patient at times has been complaining of not being able to catch his breath.  Patient oxygen saturation approximately 96% on 4 liters of oxyge.  MD to come to floor to see patient per RN request.  Will continue to monitor patient.

## 2016-02-18 NOTE — Consult Note (Addendum)
SURGICAL CONSULTATION NOTE (initial) - cpt: 99255  HISTORY OF PRESENT ILLNESS (HPI):  75 y.o. obese male poor historian presented to [facility] ED overnight with generalized weakness and Right shoulder + Right flank pain that he reports began after he fell from standing yesterday. He also says he's been coughing blood and brown sputum over the past month with increasing shortness of breath during this time, and he thinks he may have vomited some blood as well. Regarding his Right-sided abdominal/flank pain, he describes that it was worse last night, but today he says it seems to have resolved (review of MAR suggests last narcotic pain medications were administered 4 hours prior to this evaluation). He currently reports +flatus and denies abdominal pain, N/V, fever/chills, CP, or SOB on supplemental oxygen via nasal cannula.  Surgery is consulted by ED physician Dr. Thurnell Garbe in this context for evaluation and management of transverse colon pneumatosis.  PAST MEDICAL HISTORY (PMH):  Past Medical History:  Diagnosis Date  . Cervical disc disease   . Chronic constipation   . Diabetes mellitus without complication ([place])    Borderline diabetic-diet controlled  . Hyperlipidemia   . Hypertension   . Peripheral neuropathy ([place])      PAST SURGICAL HISTORY ([place]):  Past Surgical History:  Procedure Laterality Date  . COLONOSCOPY  2003   RMR: internal hemorrhoids, otherwise normal  . COLONOSCOPY  05/30/2012   Procedure: COLONOSCOPY;  Surgeon: Daneil Dolin, MD;  Location: [facility];  Service: Endoscopy;  Laterality: N/A;  12:00  . HERNIA REPAIR    . TONSILLECTOMY       MEDICATIONS:  Prior to Admission medications   Medication Sig Start Date End Date Taking? Authorizing Provider  HYDROcodone-acetaminophen (NORCO) 7.5-325 MG tablet Take 1 tablet by mouth every 6 (six) hours as needed for moderate pain (Must last 30 days.Do not drive or operate machinery while taking this medicine.). 01/14/16  Yes  Sanjuana Kava, MD  AMITIZA 24 MCG capsule Take 2 capsules by mouth daily. 08/14/13   Historical Provider, MD  aspirin 325 MG tablet Take 325 mg by mouth daily.    Historical Provider, MD  atorvastatin (LIPITOR) 10 MG tablet Take 10 mg by mouth daily.  05/10/12   Historical Provider, MD  clobetasol ointment (TEMOVATE) [id number] % Apply 1 application topically as needed. 07/20/13   Historical Provider, MD  fluticasone (CUTIVATE) 0.05 % cream Apply 1 application topically as needed. 07/20/13   Historical Provider, MD  furosemide (LASIX) 40 MG tablet Take 1 tablet (40 mg total) by mouth 2 (two) times daily. 10/12/15   Davonna Belling, MD  ketoconazole (NIZORAL) 2 % cream Apply 1 application topically as needed. 07/26/13   Historical Provider, MD  lisinopril (PRINIVIL,ZESTRIL) 10 MG tablet Take 10 mg by mouth daily.  05/10/12   Historical Provider, MD  LORazepam (ATIVAN) 2 MG tablet Take 2 mg by mouth at bedtime.    Historical Provider, MD  meloxicam (MOBIC) 7.5 MG tablet Take 1 tablet by mouth daily. 07/28/14   Historical Provider, MD  mirtazapine (REMERON) 15 MG tablet Take 7.5 mg by mouth at bedtime. 08/18/14   Historical Provider, MD  polyethylene glycol powder (GLYCOLAX/MIRALAX) powder Take 17 g by mouth 2 (two) times daily.  05/04/12   Historical Provider, MD  polyethylene glycol-electrolytes (TRILYTE) 420 G solution Take 4,000 mLs by mouth as directed. 05/21/12   Daneil Dolin, MD  potassium chloride SA (K-DUR,KLOR-CON) 20 MEQ tablet Take 0.5 tablets by mouth daily. 08/26/13   Historical  Provider, MD  pregabalin (LYRICA) 150 MG capsule Take 150 mg by mouth 2 (two) times daily.    Historical Provider, MD  traZODone (DESYREL) 50 MG tablet Take 1 tablet by mouth at bedtime. 07/28/14   Historical Provider, MD     ALLERGIES:  Allergies  Allergen Reactions  . Penicillins Other (See Comments)    Unknown-reaction is unknown  . Tegretol [Carbamazepine] Itching     SOCIAL HISTORY:  Social History   Social History   . Marital status: Married    Spouse name: N/A  . Number of children: N/A  . Years of education: N/A   Occupational History  . [employer]     retired   Social History Main Topics  . Smoking status: Current Every Day Smoker    Years: 40.00  . Smokeless tobacco: Former Systems developer    Types: Chew  . Alcohol use No  . Drug use: No  . Sexual activity: Not on file   Other Topics Concern  . Not on file   Social History Narrative  . No narrative on file    The patient currently resides (home / rehab facility / nursing home): Home  The patient normally is (ambulatory / bedbound): Ambulatory, though with history of frequent falls   FAMILY HISTORY:  Family History  Problem Relation Age of Onset  . Cancer Father   . Colon cancer Paternal Uncle   . Diabetes Brother      REVIEW OF SYSTEMS:  Constitutional: denies weight loss, fever, chills, or sweats  Eyes: denies any other vision changes, history of eye injury  ENT: denies sore throat, hearing problems  Respiratory: shortness of breath and hemoptysis as per HPI  Cardiovascular: denies chest pain, palpitations  Gastrointestinal: abdominal pain, N/V, and bowel function as per HPI Genitourinary: denies burning with urination or urinary frequency Musculoskeletal: Right shoulder and Right flank pain as per HPI  Skin: denies any other rashes or skin discolorations  Neurological: denies any other headache, dizziness, weakness  Psychiatric: denies any other depression, anxiety   All other review of systems were negative   VITAL SIGNS:  Temp:  [98.3 F (36.8 C)-98.5 F (36.9 C)] 98.3 F (36.8 C) (10/19 0815) Pulse Rate:  [98-115] 98 (10/19 0930) Resp:  [18-36] 24 (10/19 0900) BP: (131-165)/(61-98) 131/67 (10/19 0930) SpO2:  [89 %-94 %] 91 % (10/19 0930) Weight:  [129.3 kg (285 lb)] 129.3 kg (285 lb) (10/19 0457)     Height: 5\' 11"  (180.3 cm) Weight: 129.3 kg (285 lb) BMI (Calculated): 39.8   INTAKE/OUTPUT:  This  shift: No intake/output data recorded.  Last 2 shifts: @IOLAST2SHIFTS @   PHYSICAL EXAM:  Constitutional:  -- Obese body habitus  -- Awake, alert, and oriented x3, though a poor historian regarding chronology of events Eyes:  -- Pupils equally round and reactive to light  -- No scleral icterus  Ear, nose, and throat:  -- No jugular venous distension  Pulmonary:  -- Mild B/L crackles, no wheezing appreciated  -- Diffusely reduced breath sounds bilaterally, attributable to body habitus -- Breathing non-labored at rest Cardiovascular:  -- S1, S2 present  -- No pericardial rubs Gastrointestinal:  -- Abdomen obese, soft, and completely non-tender without guarding or rebound  -- No abdominal masses appreciated, pulsatile or otherwise  Musculoskeletal / Integumentary:  -- Wounds or skin discoloration: None appreciated -- Extremities: B/L UE and LE FROM, hands and feet warm Neurologic:  -- Motor function: intact and symmetric -- Sensation: intact and symmetric  Labs:  CBC Latest Ref Rng & Units 02/18/2016 10/12/2015 08/20/2014  WBC 4.0 - 10.5 K/uL 19.9(H) 5.6 8.2  Hemoglobin 13.0 - 17.0 g/dL 14.7 14.3 14.8  Hematocrit 39.0 - 52.0 % 44.6 43.2 43.4  Platelets 150 - 400 K/uL 298 235 227   CMP Latest Ref Rng & Units 02/18/2016 10/12/2015 08/20/2014  Glucose 65 - 99 mg/dL 292(H) 249(H) 115(H)  BUN 6 - 20 mg/dL 17 13 12   Creatinine 0.61 - 1.24 mg/dL 0.88 0.65 0.72  Sodium 135 - 145 mmol/L 135 134(L) 139  Potassium 3.5 - 5.1 mmol/L 4.2 3.8 3.0(L)  Chloride 101 - 111 mmol/L 98(L) 101 100  CO2 22 - 32 mmol/L 28 26 30   Calcium 8.9 - 10.3 mg/dL 9.1 8.3(L) 8.8  Total Protein 6.5 - 8.1 g/dL 8.2(H) 6.9 7.1  Total Bilirubin 0.3 - 1.2 mg/dL 0.9 0.6 0.9  Alkaline Phos 38 - 126 U/L 111 103 72  AST 15 - 41 U/L 19 25 18   ALT 17 - 63 U/L 20 20 17    Lactate: 1.9 Troponin: 0.11 (x1)  Imaging studies:  CT Abdomen and Pelvis without Contrast (02/18/2016) Stomach/Bowel: There is no evidence of bowel  obstruction. However, there does appear to be pneumatosis involving a portion of the transverse colon as well as a portion of the proximal descending colon. This potentially may represent benign pneumatosis, but ischemic bowel must be considered.  Vascular/Lymphatic: Atherosclerosis of abdominal aorta is noted without aneurysm formation. No significant adenopathy is noted.  CTA Abdomen and Pelvis (02/18/2016) VASCULAR Limited evaluation of the aorta and visceral arteries due to poor contrast opacification and technical issues with this examination. No gross abnormality to the aorta such as an aneurysm or dissection. Main visceral arteries appear to be patent without significant stenosis.  NON-VASCULAR Stable colonic pneumatosis. The pneumatosis is involving the transverse colon and a segment of the descending colon. No significant change from the previous examination and etiology is unknown. No focal bowel wall thickening. No free intraperitoneal air.  Assessment/Plan: (ICD-10's: K65.89) 75 y.o. 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   - agree with antibiotics and supportive care   - NPO and IV fluids for now with serial abdominal exams   - medical management of co-morbidities as per medical team  - will follow along with primary team for now to reassess  - please call if any questions   - DVT prophylaxis  All of the above findings and recommendations were discussed with  the patient, and all of his questions were answered to his expressed satisfaction.  Thank you for the opportunity to participate in this patient's care.   -- Marilynne Drivers Rosana Hoes, MD, [place]: [facility] Office: [phone]

## 2016-02-18 NOTE — ED Notes (Signed)
Pt c/o right side abd pain, right side flank pain that started tonight along with sob, ? Vomiting blood, pt states that his health has declined over the past few days, last bowel movement was 2-3 days ago, normally has one everyday, pt yelling out in room with "wave" of pain, Dr Tomi Bamberger at bedside,

## 2016-02-18 NOTE — ED Notes (Signed)
Patient transported to CT 

## 2016-02-19 ENCOUNTER — Inpatient Hospital Stay (HOSPITAL_COMMUNITY): Payer: Medicare Other

## 2016-02-19 DIAGNOSIS — R7989 Other specified abnormal findings of blood chemistry: Secondary | ICD-10-CM

## 2016-02-19 DIAGNOSIS — L8990 Pressure ulcer of unspecified site, unspecified stage: Secondary | ICD-10-CM | POA: Diagnosis present

## 2016-02-19 LAB — CBC
HCT: 42.1 % (ref 39.0–52.0)
HEMOGLOBIN: 13.9 g/dL (ref 13.0–17.0)
MCH: 30.8 pg (ref 26.0–34.0)
MCHC: 33 g/dL (ref 30.0–36.0)
MCV: 93.3 fL (ref 78.0–100.0)
Platelets: 274 10*3/uL (ref 150–400)
RBC: 4.51 MIL/uL (ref 4.22–5.81)
RDW: 12.9 % (ref 11.5–15.5)
WBC: 29.1 10*3/uL — ABNORMAL HIGH (ref 4.0–10.5)

## 2016-02-19 LAB — GLUCOSE, CAPILLARY
GLUCOSE-CAPILLARY: 326 mg/dL — AB (ref 65–99)
Glucose-Capillary: 258 mg/dL — ABNORMAL HIGH (ref 65–99)
Glucose-Capillary: 295 mg/dL — ABNORMAL HIGH (ref 65–99)
Glucose-Capillary: 311 mg/dL — ABNORMAL HIGH (ref 65–99)
Glucose-Capillary: 328 mg/dL — ABNORMAL HIGH (ref 65–99)
Glucose-Capillary: 317 mg/dL — ABNORMAL HIGH (ref 65–99)

## 2016-02-19 LAB — BASIC METABOLIC PANEL
ANION GAP: 8 (ref 5–15)
BUN: 15 mg/dL (ref 6–20)
CALCIUM: 8.6 mg/dL — AB (ref 8.9–10.3)
CO2: 27 mmol/L (ref 22–32)
CREATININE: 0.71 mg/dL (ref 0.61–1.24)
Chloride: 101 mmol/L (ref 101–111)
GLUCOSE: 292 mg/dL — AB (ref 65–99)
Potassium: 4.3 mmol/L (ref 3.5–5.1)
Sodium: 136 mmol/L (ref 135–145)

## 2016-02-19 LAB — TROPONIN I: TROPONIN I: 0.12 ng/mL — AB (ref <0.03)

## 2016-02-19 LAB — HEMOGLOBIN A1C
HEMOGLOBIN A1C: 8.7 % — AB (ref 4.8–5.6)
MEAN PLASMA GLUCOSE: 203 mg/dL

## 2016-02-19 LAB — ECHOCARDIOGRAM COMPLETE
Height: 71 in
Weight: 5840 oz

## 2016-02-19 LAB — MRSA PCR SCREENING: MRSA by PCR: POSITIVE — AB

## 2016-02-19 MED ORDER — MUPIROCIN 2 % EX OINT
1.0000 "application " | TOPICAL_OINTMENT | Freq: Two times a day (BID) | CUTANEOUS | Status: AC
  Administered 2016-02-20 – 2016-02-24 (×9): 1 via NASAL
  Filled 2016-02-19 (×2): qty 22

## 2016-02-19 MED ORDER — ALBUTEROL SULFATE (2.5 MG/3ML) 0.083% IN NEBU
2.5000 mg | INHALATION_SOLUTION | RESPIRATORY_TRACT | Status: DC | PRN
  Filled 2016-02-19 (×2): qty 3

## 2016-02-19 MED ORDER — METHYLPREDNISOLONE SODIUM SUCC 40 MG IJ SOLR
20.0000 mg | Freq: Two times a day (BID) | INTRAMUSCULAR | Status: DC
  Administered 2016-02-19 – 2016-02-20 (×3): 20 mg via INTRAVENOUS
  Filled 2016-02-19 (×3): qty 1

## 2016-02-19 MED ORDER — PERFLUTREN LIPID MICROSPHERE
1.0000 mL | INTRAVENOUS | Status: AC | PRN
  Administered 2016-02-19: 6 mL via INTRAVENOUS
  Filled 2016-02-19: qty 10

## 2016-02-19 MED ORDER — HYDROCODONE-ACETAMINOPHEN 5-325 MG PO TABS
1.0000 | ORAL_TABLET | Freq: Four times a day (QID) | ORAL | Status: DC | PRN
  Administered 2016-02-19 – 2016-03-05 (×14): 1 via ORAL
  Filled 2016-02-19 (×15): qty 1

## 2016-02-19 MED ORDER — VANCOMYCIN HCL IN DEXTROSE 1-5 GM/200ML-% IV SOLN
1000.0000 mg | Freq: Three times a day (TID) | INTRAVENOUS | Status: DC
  Administered 2016-02-19 – 2016-02-21 (×5): 1000 mg via INTRAVENOUS
  Filled 2016-02-19 (×6): qty 200

## 2016-02-19 MED ORDER — ALBUTEROL SULFATE (2.5 MG/3ML) 0.083% IN NEBU
INHALATION_SOLUTION | RESPIRATORY_TRACT | Status: AC
  Administered 2016-02-19: 2.5 mg via RESPIRATORY_TRACT
  Filled 2016-02-19: qty 3

## 2016-02-19 MED ORDER — CHLORHEXIDINE GLUCONATE CLOTH 2 % EX PADS
6.0000 | MEDICATED_PAD | Freq: Every day | CUTANEOUS | Status: AC
  Administered 2016-02-20 – 2016-02-24 (×5): 6 via TOPICAL

## 2016-02-19 MED ORDER — VANCOMYCIN HCL 10 G IV SOLR
1500.0000 mg | Freq: Once | INTRAVENOUS | Status: AC
  Administered 2016-02-19: 1500 mg via INTRAVENOUS
  Filled 2016-02-19: qty 1500

## 2016-02-19 MED ORDER — ALBUTEROL SULFATE (2.5 MG/3ML) 0.083% IN NEBU
2.5000 mg | INHALATION_SOLUTION | Freq: Four times a day (QID) | RESPIRATORY_TRACT | Status: DC
  Administered 2016-02-19 – 2016-02-26 (×27): 2.5 mg via RESPIRATORY_TRACT
  Filled 2016-02-19 (×25): qty 3

## 2016-02-19 NOTE — Care Management Important Message (Signed)
Important Message  Patient Details  Name: Richard Fields MRN: [id number] Date of Birth: [birth year]   Medicare Important Message Given:  Yes    Sherald Barge, RN 02/19/2016, 11:48 AM

## 2016-02-19 NOTE — Progress Notes (Signed)
Patient to be transferred to ICU. Verbal report given to [employer]. All questions were answered and no further questions at this time.

## 2016-02-19 NOTE — Consult Note (Signed)
Consult requested by: Dr. Willey Blade Consult requested for cavitary pneumonia:  HPI: This is a 75 year old who came to the emergency department yesterday with shortness of breath and sputum production. He has been complaining of pain in his right shoulder diaphoresis chills. His past medical history is positive for diabetes hyperlipidemia hypertension peripheral neuropathy and morbid obesity. He does not have a history of aspiration or of any lung disease. He is confused this morning which limits his history. He says he is not coughing now. He is short of breath. Denies any chest pain but does have right shoulder pain. No abdominal pain. He has some chronic venous stasis in his legs but no swelling he says. No known exposure to TB.  Past Medical History:  Diagnosis Date  . Cervical disc disease   . Chronic constipation   . Diabetes mellitus without complication ([place])    Borderline diabetic-diet controlled  . Hyperlipidemia   . Hypertension   . Peripheral neuropathy (HCC)      Family History  Problem Relation Age of Onset  . Cancer Father   . Colon cancer Paternal Uncle   . Diabetes Brother      Social History   Social History  . Marital status: Married    Spouse name: N/A  . Number of children: N/A  . Years of education: N/A   Occupational History  . [employer]     retired   Social History Main Topics  . Smoking status: Current Every Day Smoker    Years: 40.00  . Smokeless tobacco: Former Systems developer    Types: Chew  . Alcohol use No  . Drug use: No  . Sexual activity: Not Asked   Other Topics Concern  . None   Social History Narrative  . None     ROS: Except as mentioned above and with the caveat of his confusion 10 point review of systems is negative    Objective: Vital signs in last 24 hours: Temp:  [98.2 F (36.8 C)-100 F (37.8 C)] 98.2 F (36.8 C) (10/20 0437) Pulse Rate:  [81-122] 82 (10/20 0437) Resp:  [24-34] 24 (10/20 0437) BP:  (131-168)/(60-88) 164/81 (10/20 0437) SpO2:  [91 %-99 %] 99 % (10/20 0437) Weight:  [165.6 kg (365 lb)] 165.6 kg (365 lb) (10/19 1313) Weight change: 36.3 kg (80 lb) Last BM Date: 02/17/16  Intake/Output from previous day: 10/19 0701 - 10/20 0700 In: 2886.8 [P.O.:240; I.V.:1446.8; IV Piggyback:1200] Out: 403 [Urine:403]  PHYSICAL EXAM Constitutional he looks short of breath at rest he's wearing nasal oxygen and he is morbidly obese. Eyes: His pupils react. Ears nose mouth and throat: His mucous membranes are dry. No oral lesions. No JVD. Cardiovascular: His heart is regular with tachycardia at about 1:15. I don't hear a gallop. He has chronic venous stasis changes but no edema.Marland Kitchen Respiratory: His respiratory effort is increased. He has rhonchi more on the right than the left. Gastrointestinal: His abdomen is soft and obese with no masses. Bowel sounds are present and active. Neurological: He is confused but with no focal findings. Skin: With some sweating.  Lab Results: Basic Metabolic Panel:  Recent Labs  02/18/16 0517 02/19/16 0205  NA 135 136  K 4.2 4.3  CL 98* 101  CO2 28 27  GLUCOSE 292* 292*  BUN 17 15  CREATININE 0.88 0.71  CALCIUM 9.1 8.6*   Liver Function Tests:  Recent Labs  02/18/16 0517  AST 19  ALT 20  ALKPHOS 111  BILITOT  0.9  PROT 8.2*  ALBUMIN 3.5    Recent Labs  02/18/16 0517  LIPASE 30   No results for input(s): AMMONIA in the last 72 hours. CBC:  Recent Labs  02/18/16 0517 02/19/16 0205  WBC 19.9* 29.1*  NEUTROABS 17.0*  --   HGB 14.7 13.9  HCT 44.6 42.1  MCV 94.3 93.3  PLT 298 274   Cardiac Enzymes:  Recent Labs  02/18/16 1430 02/18/16 1958 02/19/16 0205  TROPONINI 0.10* 0.11* 0.12*   BNP: No results for input(s): PROBNP in the last 72 hours. D-Dimer:  Recent Labs  02/18/16 0517  DDIMER 0.92*   CBG:  Recent Labs  02/18/16 1623 02/19/16 0707  GLUCAP 255* 295*   Hemoglobin A1C:  Recent Labs  02/18/16 0517   HGBA1C 8.7*   Fasting Lipid Panel: No results for input(s): CHOL, HDL, LDLCALC, TRIG, CHOLHDL, LDLDIRECT in the last 72 hours. Thyroid Function Tests: No results for input(s): TSH, T4TOTAL, FREET4, T3FREE, THYROIDAB in the last 72 hours. Anemia Panel: No results for input(s): VITAMINB12, FOLATE, FERRITIN, TIBC, IRON, RETICCTPCT in the last 72 hours. Coagulation:  Recent Labs  02/18/16 0517  LABPROT 14.0  INR 1.08   Urine Drug Screen: Drugs of Abuse  No results found for: LABOPIA, COCAINSCRNUR, LABBENZ, AMPHETMU, THCU, LABBARB  Alcohol Level: No results for input(s): ETH in the last 72 hours. Urinalysis:  Recent Labs  02/18/16 1129  COLORURINE YELLOW  LABSPEC 1.015  PHURINE 6.0  GLUCOSEU 500*  HGBUR NEGATIVE  BILIRUBINUR NEGATIVE  KETONESUR 15*  PROTEINUR TRACE*  NITRITE NEGATIVE  LEUKOCYTESUR NEGATIVE   Misc. Labs:   ABGS: No results for input(s): PHART, PO2ART, TCO2, HCO3 in the last 72 hours.  Invalid input(s): PCO2   MICROBIOLOGY: Recent Results (from the past 240 hour(s))  Blood culture (routine x 2)     Status: None (Preliminary result)   Collection Time: 02/18/16  7:36 AM  Result Value Ref Range Status   Specimen Description BLOOD RIGHT HAND  Final   Special Requests   Final    BOTTLES DRAWN AEROBIC AND ANAEROBIC AEB=8CC ANA=6CC   Culture NO GROWTH <12 HOURS  Final   Report Status PENDING  Incomplete  Blood culture (routine x 2)     Status: None (Preliminary result)   Collection Time: 02/18/16  7:42 AM  Result Value Ref Range Status   Specimen Description BLOOD LEFT ANTECUBITAL  Final   Special Requests BOTTLES DRAWN AEROBIC AND ANAEROBIC 6CC  Final   Culture NO GROWTH <12 HOURS  Final   Report Status PENDING  Incomplete    Studies/Results: Ct Angio Chest Pe W/cm &/or Wo Cm  Result Date: 02/18/2016 CLINICAL DATA:  Hemoptysis for 2-3 days.  Back pain. EXAM: CT ANGIOGRAPHY CHEST WITH CONTRAST TECHNIQUE: Multidetector CT imaging of the chest  was performed using the standard protocol during bolus administration of intravenous contrast. Multiplanar CT image reconstructions and MIPs were obtained to evaluate the vascular anatomy. CONTRAST:  100 cc Isovue 370 intravenous COMPARISON:  None. FINDINGS: Cardiovascular: CTA of the pulmonary arteries is limited by motion and suboptimal opacification. Apparent filling defect in a subsegmental left upper lobe branch, 6:121, is an area of motion and favored artifactual. The low-density is also not at a branch point. No filling defect is identified. No cardiomegaly or pericardial effusion. No acute aortic finding. Diffuse atherosclerotic calcification of the aorta and coronaries. Mediastinum/Nodes: Prominent right paratracheal lymph node of 11 mm short axis, incidental in isolation. Lungs/Pleura: There is atelectasis at the  right base, multi segment, with a ~ 3 cm nonenhancing area in the lower lobe that has central lucency. There is small right pleural effusion that is small, with mild posterior chest loculation. No edema. Upper Abdomen: Hepatic steatosis Musculoskeletal: Degenerative changes without acute or aggressive finding. Review of the MIP images confirms the above findings. IMPRESSION: 1. Limited CTA with no definitive pulmonary embolism. There is an apparent filling defect within a subsegmental left upper lobe branch, but favored artifactual. Consider lower extremity Dopplers. 2. Right lower lobe atelectasis surrounding a nonenhancing cavitary focus, likely a cavitary pneumonia. Right pleural effusion is small but has areas of early loculation. 3. After completed treatment, recommend CT follow-up. Electronically Signed   By: Monte Fantasia M.D.   On: 02/18/2016 07:44   Dg Chest Port 1 View  Result Date: 02/18/2016 CLINICAL DATA:  75 year old male with shortness of breath and hemoptysis. EXAM: PORTABLE CHEST 1 VIEW COMPARISON:  Chest radiograph dated 08/20/2014 FINDINGS: There is shallow inspiration.  Mild diffuse interstitial prominence and crowding as well as mild bronchiectatic changes. Right lung base hazy density likely atelectatic changes. Developing infiltrate is not excluded. Clinical correlation is recommended. There is no pleural effusion or pneumothorax. Stable mild cardiomegaly. No acute osseous pathology. IMPRESSION: Right lung base atelectatic changes versus less likely infiltrate. Clinical correlation is recommended. Electronically Signed   By: Anner Crete M.D.   On: 02/18/2016 06:23   Dg Shoulder Right Portable  Result Date: 02/18/2016 CLINICAL DATA:  Initial evaluation for acute right shoulder pain. No injury. EXAM: PORTABLE RIGHT SHOULDER COMPARISON:  None. FINDINGS: No acute fracture or dislocation. Humeral head in normal alignment with the glenoid. AC joint approximated. Degenerative osteoarthritic changes present about the [facility] joint. No periarticular calcification. Osseous mineralization normal. No soft tissue abnormality. IMPRESSION: 1. No acute osseous abnormality about the right shoulder. 2. 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 Electronically Signed   By: Jeannine Boga M.D.   On: 02/18/2016 06:24   Ct Renal Stone Study  Result Date: 02/18/2016 CLINICAL DATA:  Acute right flank pain. EXAM: CT ABDOMEN AND PELVIS WITHOUT CONTRAST TECHNIQUE: Multidetector CT imaging of the abdomen and pelvis was performed following the standard protocol without IV contrast. However, exam is somewhat limited due to respiratory motion artifact. COMPARISON:  None. FINDINGS: Lower chest: Right posterior basilar atelectasis or inflammation is noted with possible associated pleural effusion. Left lung base is clear. Hepatobiliary: No gallstones are noted. Possible mild fatty infiltration is noted of the liver. Pancreas: Normal. Spleen: Normal. Adrenals/Urinary Tract: Adrenal glands and kidneys are unremarkable. No hydronephrosis or renal obstruction is  noted. No renal or ureteral calculi are noted. Urinary bladder appears normal. Stomach/Bowel: There is no evidence of bowel obstruction. However, there does appear to be pneumatosis involving a portion of the transverse colon as well as a portion of the proximal descending colon. This potentially may represent benign pneumatosis, but ischemic bowel must be considered. Vascular/Lymphatic: Atherosclerosis of abdominal aorta is noted without aneurysm formation. No significant adenopathy is noted. Reproductive: Normal prostate gland. Other: No abnormal fluid collection is noted. 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 Possible pars defects is seen at L5. Well-defined lucencies are noted in the L3 and L4 vertebral bodies which may represent hemangiomas, but metastatic disease cannot be excluded. IMPRESSION: No hydronephrosis or renal obstruction is noted. No renal or ureteral calculi are noted. Right posterior basilar atelectasis or inflammation is noted with possible associated pleural effusion. Probable  mild fatty infiltration of the liver. Aortic atherosclerosis. Well-defined lucencies are noted in the L3 and L4 vertebral bodies which may represent hemangiomas, but MRI with and without gadolinium is recommended of the lumbar spine to rule out malignancy or metastatic disease. Pneumatosis is noted in the transverse colon as well as proximal descending colon. While this may represent benign pneumatosis, ischemic bowel cannot be excluded and clinical correlation and surgical consultation is recommended. Critical Value/emergent results were called by telephone at the time of interpretation on 02/18/2016 at 7:24 am to Dr. Rolland Porter , who verbally acknowledged these results. Electronically Signed   By: Marijo Conception, M.D.   On: 02/18/2016 07:25   Ct Angio Abd/pel W And/or Wo Contrast  Addendum Date: 02/18/2016   ADDENDUM REPORT: 02/18/2016 12:41 ADDENDUM: Small right  pleural effusion with compressive atelectasis at the right lung base. There is a small focus of low-density and gas within this collapsed right lower lobe. This is seen on sequence 5, image 9. This area measures roughly 2.2 cm. This could represent a small focus of lung necrosis or cavitary pneumonia. This was seen on the recent chest CT from 02/18/2016. Electronically Signed   By: Markus Daft M.D.   On: 02/18/2016 12:41   Result Date: 02/18/2016 CLINICAL DATA:  Follow-up colonic pneumatosis. EXAM: CT ANGIOGRAPHY ABDOMEN AND PELVIS WITH CONTRAST AND WITHOUT CONTRAST TECHNIQUE: Multidetector CT imaging of the abdomen and pelvis was performed using the standard protocol during bolus administration of intravenous contrast. Multiplanar reconstructed images and MIPs were obtained and reviewed to evaluate the vascular anatomy. CONTRAST:  133mL ISOVUE-300 IOPAMIDOL (ISOVUE-300) INJECTION 61% COMPARISON:  CT 02/18/2016 FINDINGS: VASCULAR Aorta: Poor opacification of the arterial structures due to technical difficulties with this examination. Normal caliber of the abdominal aorta with atherosclerotic disease. No evidence for an aortic dissection. Celiac: Celiac trunk is patent without significant stent stenosis at the origin. The main branch vessels are patent. SMA: The SMA is patent with some atherosclerotic plaque at the origin. There does not appear to be a critical stenosis but limited evaluation. Renals: Bilateral renal arteries are patent with calcified plaque at the origin. There does not appear to be critical stenosis but limited evaluation. IMA: Proximal IMA appears to be patent. Inflow: Iliac arteries are calcified without significant aneurysm. Iliac arteries and proximal femoral arteries appear to be patent. Veins: IVC, renal veins and portal venous system are patent. Review of the MIP images confirms the above findings. NON-VASCULAR Lower chest: Again noted is atelectasis and/or scarring at the left lung base.  Small right pleural effusion with compressive atelectasis in the right lower lobe. Coronary artery calcifications. Hepatobiliary: Normal appearance of the liver, gallbladder and portal venous system. Pancreas: Normal appearance of the pancreas without inflammation or duct dilatation. Spleen: Normal appearance of spleen without enlargement. Adrenals/Urinary Tract: Normal adrenal glands. Probable cyst in the left kidney interpolar region without hydronephrosis. Normal appearance of the right kidney without hydronephrosis. Moderate distention of the urinary bladder without gross abnormality. Stomach/Bowel: Again noted is pneumatosis involving the transverse colon and a segment of the descending colon. There is no significant colonic wall thickening. No evidence for small bowel dilatation or obstruction. Cecum is located in the right upper abdomen and difficult to exclude some pneumatosis involving the cecum. Lymphatic: No significant lymphadenopathy in the abdomen or pelvis. Reproductive: Prostate is unremarkable. Other: No evidence for abdominal or pelvic ascites. There is no free intraperitoneal air. No evidence for portal gas. 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 There is a large lucency involving the L4 vertebral body which may be related to a Schmorl's node. There is also large low-density structure in the posterior aspect of the L3 vertebral body which could be related to a large Schmorl's node. IMPRESSION: VASCULAR Limited evaluation of the aorta and visceral arteries due to poor contrast opacification and technical issues with this examination. No gross abnormality to the aorta such as an aneurysm or dissection. Main visceral arteries appear to be patent without significant stenosis. NON-VASCULAR Stable colonic pneumatosis. The pneumatosis is involving the transverse colon and a segment of the descending colon. No significant change from the previous examination  and etiology is unknown. No focal bowel wall thickening. No free intraperitoneal air. Again noted are lucent lesions within the L3 and L4 vertebral bodies. These could be related to very large Schmorl's nodes but indeterminate. Electronically Signed: By: Markus Daft M.D. On: 02/18/2016 12:22    Medications:  Prior to Admission:  Prescriptions Prior to Admission  Medication Sig Dispense Refill Last Dose  . AMITIZA 24 MCG capsule Take 2 capsules by mouth daily.   02/17/2016 at Unknown time  . Aspirin-Salicylamide-Caffeine (BC HEADACHE POWDER PO) Take 1 Package by mouth daily as needed (pain).   02/17/2016 at Unknown time  . atorvastatin (LIPITOR) 10 MG tablet Take 10 mg by mouth daily.    02/17/2016 at Unknown time  . benzonatate (TESSALON) 200 MG capsule Take 200 mg by mouth 3 (three) times daily as needed for cough.   unknown  . furosemide (LASIX) 40 MG tablet Take 1 tablet (40 mg total) by mouth 2 (two) times daily. (Patient taking differently: Take 40 mg by mouth daily. ) 4 tablet 0 02/17/2016 at Unknown time  . HYDROcodone-acetaminophen (NORCO) 7.5-325 MG tablet Take 1 tablet by mouth every 6 (six) hours as needed for moderate pain (Must last 30 days.Do not drive or operate machinery while taking this medicine.). 180 tablet 0 unknown  . LORazepam (ATIVAN) 2 MG tablet Take 2 mg by mouth at bedtime.   02/17/2016 at Unknown time  . losartan (COZAAR) 50 MG tablet Take 50 mg by mouth daily.   02/17/2016 at Unknown time  . metFORMIN (GLUCOPHAGE) 500 MG tablet Take 1 tablet by mouth 2 (two) times daily.   02/17/2016 at Unknown time  . mirtazapine (REMERON) 15 MG tablet Take 15 mg by mouth at bedtime.    02/17/2016 at Unknown time  . potassium chloride SA (K-DUR,KLOR-CON) 20 MEQ tablet Take 20 mEq by mouth daily.    02/17/2016 at Unknown time  . pregabalin (LYRICA) 150 MG capsule Take 150 mg by mouth 2 (two) times daily.   02/17/2016 at Unknown time  . traZODone (DESYREL) 50 MG tablet Take 2 tablets  by mouth at bedtime.    02/17/2016 at Unknown time   Scheduled: . atorvastatin  10 mg Oral Daily  . heparin  5,000 Units Subcutaneous Q8H  . imipenem-cilastatin  500 mg Intravenous Q6H  . insulin aspart  0-15 Units Subcutaneous TID WC  . insulin aspart  0-5 Units Subcutaneous QHS  . insulin aspart  4 Units Subcutaneous TID WC  . losartan  50 mg Oral Daily  . metoprolol tartrate  25 mg Oral BID  . mirtazapine  15 mg Oral QHS  . pregabalin  150 mg Oral BID  . sodium chloride flush  3 mL Intravenous Q12H  . traZODone  100 mg Oral QHS  . vancomycin  1,500 mg  Intravenous Once   Followed by  . vancomycin  1,000 mg Intravenous Q8H   Continuous: . sodium chloride 75 mL/hr at 02/18/16 1425   [id number] & mag hydroxide-simeth, HYDROcodone-acetaminophen, ketorolac, ondansetron **OR** ondansetron (ZOFRAN) IV, senna-docusate  Assesment: He has cavitary pneumonia. He's been having hemoptysis. He is on appropriate treatment. He seems to still be having significant issues. He's had tachycardia he's now more confused. He has morbid obesity. He does not have a known history of lung disease. This does not look like typical TB. Although he has elevated blood sugar he may get some help from starting steroids. Agree with plan to do another chest x-ray today to make sure that he's not had significant deterioration radiographically. If he continues dyspneic and tachypnea T may need higher level of care and may need to be transferred to stepdown Principal Problem:   Cavitary pneumonia Active Problems:   Hemoptysis   Morbid obesity ([place])   Pneumatosis intestinalis   Pressure injury of skin    Plan: I'm going to add fairly low-dose IV steroids. Continue current antibiotics. Review chest x-ray.    LOS: 1 day   Barbera Perritt L 02/19/2016, 8:17 AM

## 2016-02-19 NOTE — Progress Notes (Signed)
Informed by previous RN that patient has been experiencing increased labored breathing and restlessness. Respiratory therapist consulted and has assessed patient. Pt is on 4 L oxygen with saturations at 92 %.  Despite treatment of albuterol patient is still very labored. Respiratory therapist recommends BIPAP treatment. Dr. Willey Blade made aware and has given verbal order to transfer patient to ICU.

## 2016-02-19 NOTE — Progress Notes (Signed)
SURGICAL PROGRESS NOTE (cpt [phone])  Hospital Day(s): 1.   Post op day(s):  Richard Fields Kitchen   Interval History: Patient seen and examined, no acute events or new complaints overnight. It sounds like patient had some difficulty breathing this morning, but he reports he feels his breathing has improved somewhat. Regarding his Right-sided abdominal pain, he says he has not experienced any since prior to his being admitted yesterday after falling, though he states his shoulder still hurts and is worse without the pain medication he's been getting. Otherwise, he reports a single episode of nausea without emesis this morning after receiving narcotic pain medication and denies any CP or fever/chills. He also states that he has been passing flatus and does not feel bloated/distended.  Review of Systems:  Constitutional: denies fever, chills  HEENT: denies cough or congestion  Respiratory: denies any shortness of breath  Cardiovascular: denies chest pain or palpitations  Gastrointestinal: denies N/V, diarrhea or constipation  Musculoskeletal: denies pain, decreased motor or sensation  Neurological: denies HA or vision/hearing changes   Vital signs in last 24 hours: [min-max] current  Temp:  [98.2 F (36.8 C)-100 F (37.8 C)] 98.2 F (36.8 C) (10/20 0437) Pulse Rate:  [81-122] 82 (10/20 0437) Resp:  [24-28] 24 (10/20 0437) BP: (134-168)/(60-88) 164/81 (10/20 0437) SpO2:  [92 %-99 %] 99 % (10/20 0437) Weight:  [165.6 kg (365 lb)] 165.6 kg (365 lb) (10/19 1313)     Height: 5\' 11"  (180.3 cm) Weight: (!) 165.6 kg (365 lb) BMI (Calculated): 51   Intake/Output this shift:  Total I/O In: 120 [P.O.:120] Out: 300 [Urine:300]   Intake/Output last 2 shifts:  @IOLAST2SHIFTS @   Physical Exam:  Constitutional: alert, cooperative and no distress  HENT: normocephalic without obvious abnormality  Eyes: PERRL, EOM's grossly intact and symmetric  Neuro: CN II - XII grossly intact and symmetric without deficit   Respiratory: breathing non-labored at rest  Cardiovascular: regular rate and sinus rhythm  Gastrointestinal: obese, soft, completely non-tender, and non-distended  Musculoskeletal: UE and LE FROM, NT   Labs:  CBC:  Lab Results  Component Value Date   WBC 29.1 (H) 02/19/2016   RBC 4.51 02/19/2016   BMP:  Lab Results  Component Value Date   GLUCOSE 292 (H) 02/19/2016   CO2 27 02/19/2016   BUN 15 02/19/2016   CREATININE 0.71 02/19/2016   CALCIUM 8.6 (L) 02/19/2016     Imaging studies: Right chest ultrasound without thoracentesis (02/19/2016) Only a minimal amount of RIGHT pleural fluid is identified. Consolidated lower RIGHT lung is seen. Volume of fluid visualized is insufficient for thoracentesis.  Chest x-ray (02/19/2016) Cardiac shadow is enlarged. Right-sided pleural effusion is increasing from the prior exam. No focal infiltrate is seen in the left lung. No bony abnormality is noted.   Assessment/Plan: (ICD-10's: K57.89) 75 y.o. male with likely benign pneumatosis coli associated with pulmonary disease, including cavitary pneumonia for which he is admitted, involving excessive coughing x1 month without evidence otherwise to suggest ischemia (Lactate 1.9, benign abdominal exam, lack of bowel wall edema on both CT's), complicated by Right lung cavitary pneumonia with leukocytosis, recent traumatic fall from standing, limited history (poor/inconsistent historian), and pertinent comorbidities including morbid obesity (BMI 40), DM, HTN, HLD, and reportedly frequent falls from standing.              - antibiotics and supportive pulmonary care             - okay to resume diet as appropriate per primary team             -  medical management of co-morbidities as per medical team             - will signoff, but please call if any questions             - DVT prophylaxis  All of the above findings and recommendations were discussed with the patient, and all of his questions were  answered to his expressed satisfaction.  Thank you for the opportunity to participate in this patient's care.   -- Marilynne Drivers Rosana Hoes, MD, [place]: [facility] Office: [phone]

## 2016-02-19 NOTE — Progress Notes (Signed)
Received Critical lab value for Richard Fields from central lab [employer]:  MRSA PCR +  Pt placed on contact precautions.

## 2016-02-19 NOTE — Progress Notes (Signed)
SLP Cancellation Note  Patient Details Name: Richard Fields MRN: [id number] DOB: [birth year]   Cancelled treatment:       Reason Eval/Treat Not Completed: Medical issues which prohibited therapy; Pt on Bipap,  ST to reattempt BSE at a later date    [place], [place] Pathologist    Levi Aland 02/19/2016, 5:18 PM

## 2016-02-19 NOTE — Progress Notes (Addendum)
*  PRELIMINARY RESULTS* Echocardiogram 2D Echocardiogram has been performed withDefinity.  Richard Fields 02/19/2016, 4:33 PM

## 2016-02-19 NOTE — Care Management Note (Signed)
Case Management Note  Patient Details  Name: Richard Fields MRN: [id number] Date of Birth: [birth year]  Subjective/Objective:                  Pt admitted with pneumonia. He is from home, lives with his wife. Pt's wife at bedside. Pt has help that comes into clean the house 2 days a week. He uses a walker for mobility but is ind with ADL's at baseline. Pt does not have oxygen or neb machine PTA. Pt's wife anticipates he may need HH at DC.   Action/Plan: Will cont to follow for DC planning.   Expected Discharge Date:    02/24/2016              Expected Discharge Plan:  [place]  In-House Referral:  NA  Discharge planning Services  CM Consult  Post Acute Care Choice:  Home Health Choice offered to:  Patient  Status of Service:  In process, will continue to follow  Sherald Barge, RN 02/19/2016, 11:49 AM

## 2016-02-19 NOTE — Progress Notes (Signed)
Subjective: He has been noted to be dyspneic and tachypnea overnight. He was tachycardic but not febrile. He states his cough is unchanged.  Objective: Vital signs in last 24 hours: Vitals:   02/18/16 1400 02/18/16 2128 02/19/16 0026 02/19/16 0437  BP:  (!) 168/88 134/60 (!) 164/81  Pulse:  (!) 122 81 82  Resp:  (!) 26  (!) 24  Temp:  99.5 F (37.5 C)  98.2 F (36.8 C)  TempSrc:  Oral  Oral  SpO2: 92% 96% 96% 99%  Weight:      Height:       Weight change: 80 lb (36.3 kg)  Intake/Output Summary (Last 24 hours) at 02/19/16 0750 Last data filed at 02/19/16 0300  Gross per 24 hour  Intake          2886.75 ml  Output              403 ml  Net          2483.75 ml    Physical Exam: Alert but confused. Lungs reveal minimal rhonchi. Heart tachycardic in the 1:15 range now on telemetry. Abdomen is obese with no tenderness or palpable organomegaly. Extremities reveal no edema in the feet. Hyperpigmentation changes from chronic venous insufficiency are stable. He has a stage II decubitus on the sacrum.  Lab Results:    Results for orders placed or performed during the hospital encounter of 02/18/16 (from the past 24 hour(s))  Lactic acid, plasma     Status: Abnormal   Collection Time: 02/18/16 10:47 AM  Result Value Ref Range   Lactic Acid, Venous 3.5 (HH) 0.5 - 1.9 mmol/L  Urinalysis, Routine w reflex microscopic     Status: Abnormal   Collection Time: 02/18/16 11:29 AM  Result Value Ref Range   Color, Urine YELLOW YELLOW   APPearance CLEAR CLEAR   Specific Gravity, Urine 1.015 1.005 - 1.030   pH 6.0 5.0 - 8.0   Glucose, UA 500 (A) NEGATIVE mg/dL   Hgb urine dipstick NEGATIVE NEGATIVE   Bilirubin Urine NEGATIVE NEGATIVE   Ketones, ur 15 (A) NEGATIVE mg/dL   Protein, ur TRACE (A) NEGATIVE mg/dL   Nitrite NEGATIVE NEGATIVE   Leukocytes, UA NEGATIVE NEGATIVE  Urine microscopic-add on     Status: None   Collection Time: 02/18/16 11:29 AM  Result Value Ref Range   Squamous  Epithelial / LPF NONE SEEN NONE SEEN   WBC, UA 0-5 0 - 5 WBC/hpf   RBC / HPF NONE SEEN 0 - 5 RBC/hpf   Bacteria, UA NONE SEEN NONE SEEN  Troponin I (q 6hr x 3)     Status: Abnormal   Collection Time: 02/18/16  2:30 PM  Result Value Ref Range   Troponin I 0.10 (HH) <0.03 ng/mL  Glucose, capillary     Status: Abnormal   Collection Time: 02/18/16  4:23 PM  Result Value Ref Range   Glucose-Capillary 255 (H) 65 - 99 mg/dL   Comment 1 Notify RN    Comment 2 Document in Chart   Troponin I (q 6hr x 3)     Status: Abnormal   Collection Time: 02/18/16  7:58 PM  Result Value Ref Range   Troponin I 0.11 (HH) <0.03 ng/mL  Basic metabolic panel     Status: Abnormal   Collection Time: 02/19/16  2:05 AM  Result Value Ref Range   Sodium 136 135 - 145 mmol/L   Potassium 4.3 3.5 - 5.1 mmol/L   Chloride 101 101 -  111 mmol/L   CO2 27 22 - 32 mmol/L   Glucose, Bld 292 (H) 65 - 99 mg/dL   BUN 15 6 - 20 mg/dL   Creatinine, Ser 0.71 0.61 - 1.24 mg/dL   Calcium 8.6 (L) 8.9 - 10.3 mg/dL   GFR calc non Af Amer >60 >60 mL/min   GFR calc Af Amer >60 >60 mL/min   Anion gap 8 5 - 15  CBC     Status: Abnormal   Collection Time: 02/19/16  2:05 AM  Result Value Ref Range   WBC 29.1 (H) 4.0 - 10.5 K/uL   RBC 4.51 4.22 - 5.81 MIL/uL   Hemoglobin 13.9 13.0 - 17.0 g/dL   HCT 42.1 39.0 - 52.0 %   MCV 93.3 78.0 - 100.0 fL   MCH 30.8 26.0 - 34.0 pg   MCHC 33.0 30.0 - 36.0 g/dL   RDW 12.9 11.5 - 15.5 %   Platelets 274 150 - 400 K/uL  Troponin I     Status: Abnormal   Collection Time: 02/19/16  2:05 AM  Result Value Ref Range   Troponin I 0.12 (HH) <0.03 ng/mL  Glucose, capillary     Status: Abnormal   Collection Time: 02/19/16  7:07 AM  Result Value Ref Range   Glucose-Capillary 295 (H) 65 - 99 mg/dL   Comment 1 Notify RN    Comment 2 Document in Chart      ABGS No results for input(s): PHART, PO2ART, TCO2, HCO3 in the last 72 hours.  Invalid input(s): PCO2 CULTURES Recent Results (from the past  240 hour(s))  Blood culture (routine x 2)     Status: None (Preliminary result)   Collection Time: 02/18/16  7:36 AM  Result Value Ref Range Status   Specimen Description BLOOD RIGHT HAND  Final   Special Requests   Final    BOTTLES DRAWN AEROBIC AND ANAEROBIC AEB=8CC ANA=6CC   Culture NO GROWTH <12 HOURS  Final   Report Status PENDING  Incomplete  Blood culture (routine x 2)     Status: None (Preliminary result)   Collection Time: 02/18/16  7:42 AM  Result Value Ref Range Status   Specimen Description BLOOD LEFT ANTECUBITAL  Final   Special Requests BOTTLES DRAWN AEROBIC AND ANAEROBIC 6CC  Final   Culture NO GROWTH <12 HOURS  Final   Report Status PENDING  Incomplete   Studies/Results: Ct Angio Chest Pe W/cm &/or Wo Cm  Result Date: 02/18/2016 CLINICAL DATA:  Hemoptysis for 2-3 days.  Back pain. EXAM: CT ANGIOGRAPHY CHEST WITH CONTRAST TECHNIQUE: Multidetector CT imaging of the chest was performed using the standard protocol during bolus administration of intravenous contrast. Multiplanar CT image reconstructions and MIPs were obtained to evaluate the vascular anatomy. CONTRAST:  100 cc Isovue 370 intravenous COMPARISON:  None. FINDINGS: Cardiovascular: CTA of the pulmonary arteries is limited by motion and suboptimal opacification. Apparent filling defect in a subsegmental left upper lobe branch, 6:121, is an area of motion and favored artifactual. The low-density is also not at a branch point. No filling defect is identified. No cardiomegaly or pericardial effusion. No acute aortic finding. Diffuse atherosclerotic calcification of the aorta and coronaries. Mediastinum/Nodes: Prominent right paratracheal lymph node of 11 mm short axis, incidental in isolation. Lungs/Pleura: There is atelectasis at the right base, multi segment, with a ~ 3 cm nonenhancing area in the lower lobe that has central lucency. There is small right pleural effusion that is small, with mild posterior chest loculation.  No edema. Upper Abdomen: Hepatic steatosis Musculoskeletal: Degenerative changes without acute or aggressive finding. Review of the MIP images confirms the above findings. IMPRESSION: 1. Limited CTA with no definitive pulmonary embolism. There is an apparent filling defect within a subsegmental left upper lobe branch, but favored artifactual. Consider lower extremity Dopplers. 2. Right lower lobe atelectasis surrounding a nonenhancing cavitary focus, likely a cavitary pneumonia. Right pleural effusion is small but has areas of early loculation. 3. After completed treatment, recommend CT follow-up. Electronically Signed   By: Monte Fantasia M.D.   On: 02/18/2016 07:44   Dg Chest Port 1 View  Result Date: 02/18/2016 CLINICAL DATA:  75 year old male with shortness of breath and hemoptysis. EXAM: PORTABLE CHEST 1 VIEW COMPARISON:  Chest radiograph dated 08/20/2014 FINDINGS: There is shallow inspiration. Mild diffuse interstitial prominence and crowding as well as mild bronchiectatic changes. Right lung base hazy density likely atelectatic changes. Developing infiltrate is not excluded. Clinical correlation is recommended. There is no pleural effusion or pneumothorax. Stable mild cardiomegaly. No acute osseous pathology. IMPRESSION: Right lung base atelectatic changes versus less likely infiltrate. Clinical correlation is recommended. Electronically Signed   By: Anner Crete M.D.   On: 02/18/2016 06:23   Dg Shoulder Right Portable  Result Date: 02/18/2016 CLINICAL DATA:  Initial evaluation for acute right shoulder pain. No injury. EXAM: PORTABLE RIGHT SHOULDER COMPARISON:  None. FINDINGS: No acute fracture or dislocation. Humeral head in normal alignment with the glenoid. AC joint approximated. Degenerative osteoarthritic changes present about the [facility] joint. No periarticular calcification. Osseous mineralization normal. No soft tissue abnormality. IMPRESSION: 1. No acute osseous abnormality about the right  shoulder. 2. 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 Electronically Signed   By: Jeannine Boga M.D.   On: 02/18/2016 06:24   Ct Renal Stone Study  Result Date: 02/18/2016 CLINICAL DATA:  Acute right flank pain. EXAM: CT ABDOMEN AND PELVIS WITHOUT CONTRAST TECHNIQUE: Multidetector CT imaging of the abdomen and pelvis was performed following the standard protocol without IV contrast. However, exam is somewhat limited due to respiratory motion artifact. COMPARISON:  None. FINDINGS: Lower chest: Right posterior basilar atelectasis or inflammation is noted with possible associated pleural effusion. Left lung base is clear. Hepatobiliary: No gallstones are noted. Possible mild fatty infiltration is noted of the liver. Pancreas: Normal. Spleen: Normal. Adrenals/Urinary Tract: Adrenal glands and kidneys are unremarkable. No hydronephrosis or renal obstruction is noted. No renal or ureteral calculi are noted. Urinary bladder appears normal. Stomach/Bowel: There is no evidence of bowel obstruction. However, there does appear to be pneumatosis involving a portion of the transverse colon as well as a portion of the proximal descending colon. This potentially may represent benign pneumatosis, but ischemic bowel must be considered. Vascular/Lymphatic: Atherosclerosis of abdominal aorta is noted without aneurysm formation. No significant adenopathy is noted. Reproductive: Normal prostate gland. Other: No abnormal fluid collection is noted. 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 Possible pars defects is seen at L5. Well-defined lucencies are noted in the L3 and L4 vertebral bodies which may represent hemangiomas, but metastatic disease cannot be excluded. IMPRESSION: No hydronephrosis or renal obstruction is noted. No renal or ureteral calculi are noted. Right posterior basilar atelectasis or inflammation is noted with possible  associated pleural effusion. Probable mild fatty infiltration of the liver. Aortic atherosclerosis. Well-defined lucencies are noted in the L3 and L4 vertebral bodies which may represent hemangiomas, but MRI with and without gadolinium is recommended of the  lumbar spine to rule out malignancy or metastatic disease. Pneumatosis is noted in the transverse colon as well as proximal descending colon. While this may represent benign pneumatosis, ischemic bowel cannot be excluded and clinical correlation and surgical consultation is recommended. Critical Value/emergent results were called by telephone at the time of interpretation on 02/18/2016 at 7:24 am to Dr. Rolland Porter , who verbally acknowledged these results. Electronically Signed   By: Marijo Conception, M.D.   On: 02/18/2016 07:25   Ct Angio Abd/pel W And/or Wo Contrast  Addendum Date: 02/18/2016   ADDENDUM REPORT: 02/18/2016 12:41 ADDENDUM: Small right pleural effusion with compressive atelectasis at the right lung base. There is a small focus of low-density and gas within this collapsed right lower lobe. This is seen on sequence 5, image 9. This area measures roughly 2.2 cm. This could represent a small focus of lung necrosis or cavitary pneumonia. This was seen on the recent chest CT from 02/18/2016. Electronically Signed   By: Markus Daft M.D.   On: 02/18/2016 12:41   Result Date: 02/18/2016 CLINICAL DATA:  Follow-up colonic pneumatosis. EXAM: CT ANGIOGRAPHY ABDOMEN AND PELVIS WITH CONTRAST AND WITHOUT CONTRAST TECHNIQUE: Multidetector CT imaging of the abdomen and pelvis was performed using the standard protocol during bolus administration of intravenous contrast. Multiplanar reconstructed images and MIPs were obtained and reviewed to evaluate the vascular anatomy. CONTRAST:  135mL ISOVUE-300 IOPAMIDOL (ISOVUE-300) INJECTION 61% COMPARISON:  CT 02/18/2016 FINDINGS: VASCULAR Aorta: Poor opacification of the arterial structures due to technical difficulties  with this examination. Normal caliber of the abdominal aorta with atherosclerotic disease. No evidence for an aortic dissection. Celiac: Celiac trunk is patent without significant stent stenosis at the origin. The main branch vessels are patent. SMA: The SMA is patent with some atherosclerotic plaque at the origin. There does not appear to be a critical stenosis but limited evaluation. Renals: Bilateral renal arteries are patent with calcified plaque at the origin. There does not appear to be critical stenosis but limited evaluation. IMA: Proximal IMA appears to be patent. Inflow: Iliac arteries are calcified without significant aneurysm. Iliac arteries and proximal femoral arteries appear to be patent. Veins: IVC, renal veins and portal venous system are patent. Review of the MIP images confirms the above findings. NON-VASCULAR Lower chest: Again noted is atelectasis and/or scarring at the left lung base. Small right pleural effusion with compressive atelectasis in the right lower lobe. Coronary artery calcifications. Hepatobiliary: Normal appearance of the liver, gallbladder and portal venous system. Pancreas: Normal appearance of the pancreas without inflammation or duct dilatation. Spleen: Normal appearance of spleen without enlargement. Adrenals/Urinary Tract: Normal adrenal glands. Probable cyst in the left kidney interpolar region without hydronephrosis. Normal appearance of the right kidney without hydronephrosis. Moderate distention of the urinary bladder without gross abnormality. Stomach/Bowel: Again noted is pneumatosis involving the transverse colon and a segment of the descending colon. There is no significant colonic wall thickening. No evidence for small bowel dilatation or obstruction. Cecum is located in the right upper abdomen and difficult to exclude some pneumatosis involving the cecum. Lymphatic: No significant lymphadenopathy in the abdomen or pelvis. Reproductive: Prostate is unremarkable.  Other: No evidence for abdominal or pelvic ascites. There is no free intraperitoneal air. No evidence for portal gas. 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 There is a large lucency involving the L4 vertebral body which may be related to a Schmorl's node. There is also large low-density structure in the posterior  aspect of the L3 vertebral body which could be related to a large Schmorl's node. IMPRESSION: VASCULAR Limited evaluation of the aorta and visceral arteries due to poor contrast opacification and technical issues with this examination. No gross abnormality to the aorta such as an aneurysm or dissection. Main visceral arteries appear to be patent without significant stenosis. NON-VASCULAR Stable colonic pneumatosis. The pneumatosis is involving the transverse colon and a segment of the descending colon. No significant change from the previous examination and etiology is unknown. No focal bowel wall thickening. No free intraperitoneal air. Again noted are lucent lesions within the L3 and L4 vertebral bodies. These could be related to very large Schmorl's nodes but indeterminate. Electronically Signed: By: Markus Daft M.D. On: 02/18/2016 12:22   Micro Results: Recent Results (from the past 240 hour(s))  Blood culture (routine x 2)     Status: None (Preliminary result)   Collection Time: 02/18/16  7:36 AM  Result Value Ref Range Status   Specimen Description BLOOD RIGHT HAND  Final   Special Requests   Final    BOTTLES DRAWN AEROBIC AND ANAEROBIC AEB=8CC ANA=6CC   Culture NO GROWTH <12 HOURS  Final   Report Status PENDING  Incomplete  Blood culture (routine x 2)     Status: None (Preliminary result)   Collection Time: 02/18/16  7:42 AM  Result Value Ref Range Status   Specimen Description BLOOD LEFT ANTECUBITAL  Final   Special Requests BOTTLES DRAWN AEROBIC AND ANAEROBIC 6CC  Final   Culture NO GROWTH <12 HOURS  Final   Report Status PENDING   Incomplete   Studies/Results: Ct Angio Chest Pe W/cm &/or Wo Cm  Result Date: 02/18/2016 CLINICAL DATA:  Hemoptysis for 2-3 days.  Back pain. EXAM: CT ANGIOGRAPHY CHEST WITH CONTRAST TECHNIQUE: Multidetector CT imaging of the chest was performed using the standard protocol during bolus administration of intravenous contrast. Multiplanar CT image reconstructions and MIPs were obtained to evaluate the vascular anatomy. CONTRAST:  100 cc Isovue 370 intravenous COMPARISON:  None. FINDINGS: Cardiovascular: CTA of the pulmonary arteries is limited by motion and suboptimal opacification. Apparent filling defect in a subsegmental left upper lobe branch, 6:121, is an area of motion and favored artifactual. The low-density is also not at a branch point. No filling defect is identified. No cardiomegaly or pericardial effusion. No acute aortic finding. Diffuse atherosclerotic calcification of the aorta and coronaries. Mediastinum/Nodes: Prominent right paratracheal lymph node of 11 mm short axis, incidental in isolation. Lungs/Pleura: There is atelectasis at the right base, multi segment, with a ~ 3 cm nonenhancing area in the lower lobe that has central lucency. There is small right pleural effusion that is small, with mild posterior chest loculation. No edema. Upper Abdomen: Hepatic steatosis Musculoskeletal: Degenerative changes without acute or aggressive finding. Review of the MIP images confirms the above findings. IMPRESSION: 1. Limited CTA with no definitive pulmonary embolism. There is an apparent filling defect within a subsegmental left upper lobe branch, but favored artifactual. Consider lower extremity Dopplers. 2. Right lower lobe atelectasis surrounding a nonenhancing cavitary focus, likely a cavitary pneumonia. Right pleural effusion is small but has areas of early loculation. 3. After completed treatment, recommend CT follow-up. Electronically Signed   By: Monte Fantasia M.D.   On: 02/18/2016 07:44    Dg Chest Port 1 View  Result Date: 02/18/2016 CLINICAL DATA:  75 year old male with shortness of breath and hemoptysis. EXAM: PORTABLE CHEST 1 VIEW COMPARISON:  Chest radiograph dated 08/20/2014 FINDINGS: There  is shallow inspiration. Mild diffuse interstitial prominence and crowding as well as mild bronchiectatic changes. Right lung base hazy density likely atelectatic changes. Developing infiltrate is not excluded. Clinical correlation is recommended. There is no pleural effusion or pneumothorax. Stable mild cardiomegaly. No acute osseous pathology. IMPRESSION: Right lung base atelectatic changes versus less likely infiltrate. Clinical correlation is recommended. Electronically Signed   By: Anner Crete M.D.   On: 02/18/2016 06:23   Dg Shoulder Right Portable  Result Date: 02/18/2016 CLINICAL DATA:  Initial evaluation for acute right shoulder pain. No injury. EXAM: PORTABLE RIGHT SHOULDER COMPARISON:  None. FINDINGS: No acute fracture or dislocation. Humeral head in normal alignment with the glenoid. AC joint approximated. Degenerative osteoarthritic changes present about the [facility] joint. No periarticular calcification. Osseous mineralization normal. No soft tissue abnormality. IMPRESSION: 1. No acute osseous abnormality about the right shoulder. 2. 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 Electronically Signed   By: Jeannine Boga M.D.   On: 02/18/2016 06:24   Ct Renal Stone Study  Result Date: 02/18/2016 CLINICAL DATA:  Acute right flank pain. EXAM: CT ABDOMEN AND PELVIS WITHOUT CONTRAST TECHNIQUE: Multidetector CT imaging of the abdomen and pelvis was performed following the standard protocol without IV contrast. However, exam is somewhat limited due to respiratory motion artifact. COMPARISON:  None. FINDINGS: Lower chest: Right posterior basilar atelectasis or inflammation is noted with possible associated pleural effusion. Left lung base is clear.  Hepatobiliary: No gallstones are noted. Possible mild fatty infiltration is noted of the liver. Pancreas: Normal. Spleen: Normal. Adrenals/Urinary Tract: Adrenal glands and kidneys are unremarkable. No hydronephrosis or renal obstruction is noted. No renal or ureteral calculi are noted. Urinary bladder appears normal. Stomach/Bowel: There is no evidence of bowel obstruction. However, there does appear to be pneumatosis involving a portion of the transverse colon as well as a portion of the proximal descending colon. This potentially may represent benign pneumatosis, but ischemic bowel must be considered. Vascular/Lymphatic: Atherosclerosis of abdominal aorta is noted without aneurysm formation. No significant adenopathy is noted. Reproductive: Normal prostate gland. Other: No abnormal fluid collection is noted. 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 Possible pars defects is seen at L5. Well-defined lucencies are noted in the L3 and L4 vertebral bodies which may represent hemangiomas, but metastatic disease cannot be excluded. IMPRESSION: No hydronephrosis or renal obstruction is noted. No renal or ureteral calculi are noted. Right posterior basilar atelectasis or inflammation is noted with possible associated pleural effusion. Probable mild fatty infiltration of the liver. Aortic atherosclerosis. Well-defined lucencies are noted in the L3 and L4 vertebral bodies which may represent hemangiomas, but MRI with and without gadolinium is recommended of the lumbar spine to rule out malignancy or metastatic disease. Pneumatosis is noted in the transverse colon as well as proximal descending colon. While this may represent benign pneumatosis, ischemic bowel cannot be excluded and clinical correlation and surgical consultation is recommended. Critical Value/emergent results were called by telephone at the time of interpretation on 02/18/2016 at 7:24 am to Dr. Rolland Porter , who  verbally acknowledged these results. Electronically Signed   By: Marijo Conception, M.D.   On: 02/18/2016 07:25   Ct Angio Abd/pel W And/or Wo Contrast  Addendum Date: 02/18/2016   ADDENDUM REPORT: 02/18/2016 12:41 ADDENDUM: Small right pleural effusion with compressive atelectasis at the right lung base. There is a small focus of low-density and gas within this collapsed right lower lobe. This is seen  on sequence 5, image 9. This area measures roughly 2.2 cm. This could represent a small focus of lung necrosis or cavitary pneumonia. This was seen on the recent chest CT from 02/18/2016. Electronically Signed   By: Markus Daft M.D.   On: 02/18/2016 12:41   Result Date: 02/18/2016 CLINICAL DATA:  Follow-up colonic pneumatosis. EXAM: CT ANGIOGRAPHY ABDOMEN AND PELVIS WITH CONTRAST AND WITHOUT CONTRAST TECHNIQUE: Multidetector CT imaging of the abdomen and pelvis was performed using the standard protocol during bolus administration of intravenous contrast. Multiplanar reconstructed images and MIPs were obtained and reviewed to evaluate the vascular anatomy. CONTRAST:  116mL ISOVUE-300 IOPAMIDOL (ISOVUE-300) INJECTION 61% COMPARISON:  CT 02/18/2016 FINDINGS: VASCULAR Aorta: Poor opacification of the arterial structures due to technical difficulties with this examination. Normal caliber of the abdominal aorta with atherosclerotic disease. No evidence for an aortic dissection. Celiac: Celiac trunk is patent without significant stent stenosis at the origin. The main branch vessels are patent. SMA: The SMA is patent with some atherosclerotic plaque at the origin. There does not appear to be a critical stenosis but limited evaluation. Renals: Bilateral renal arteries are patent with calcified plaque at the origin. There does not appear to be critical stenosis but limited evaluation. IMA: Proximal IMA appears to be patent. Inflow: Iliac arteries are calcified without significant aneurysm. Iliac arteries and proximal  femoral arteries appear to be patent. Veins: IVC, renal veins and portal venous system are patent. Review of the MIP images confirms the above findings. NON-VASCULAR Lower chest: Again noted is atelectasis and/or scarring at the left lung base. Small right pleural effusion with compressive atelectasis in the right lower lobe. Coronary artery calcifications. Hepatobiliary: Normal appearance of the liver, gallbladder and portal venous system. Pancreas: Normal appearance of the pancreas without inflammation or duct dilatation. Spleen: Normal appearance of spleen without enlargement. Adrenals/Urinary Tract: Normal adrenal glands. Probable cyst in the left kidney interpolar region without hydronephrosis. Normal appearance of the right kidney without hydronephrosis. Moderate distention of the urinary bladder without gross abnormality. Stomach/Bowel: Again noted is pneumatosis involving the transverse colon and a segment of the descending colon. There is no significant colonic wall thickening. No evidence for small bowel dilatation or obstruction. Cecum is located in the right upper abdomen and difficult to exclude some pneumatosis involving the cecum. Lymphatic: No significant lymphadenopathy in the abdomen or pelvis. Reproductive: Prostate is unremarkable. Other: No evidence for abdominal or pelvic ascites. There is no free intraperitoneal air. No evidence for portal gas. 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 There is a large lucency involving the L4 vertebral body which may be related to a Schmorl's node. There is also large low-density structure in the posterior aspect of the L3 vertebral body which could be related to a large Schmorl's node. IMPRESSION: VASCULAR Limited evaluation of the aorta and visceral arteries due to poor contrast opacification and technical issues with this examination. No gross abnormality to the aorta such as an aneurysm or dissection. Main  visceral arteries appear to be patent without significant stenosis. NON-VASCULAR Stable colonic pneumatosis. The pneumatosis is involving the transverse colon and a segment of the descending colon. No significant change from the previous examination and etiology is unknown. No focal bowel wall thickening. No free intraperitoneal air. Again noted are lucent lesions within the L3 and L4 vertebral bodies. These could be related to very large Schmorl's nodes but indeterminate. Electronically Signed: By: Markus Daft M.D. On: 02/18/2016 12:22   Medications:  I have reviewed the patient's current medications Scheduled Meds: . atorvastatin  10 mg Oral Daily  . heparin  5,000 Units Subcutaneous Q8H  . imipenem-cilastatin  500 mg Intravenous Q6H  . insulin aspart  0-15 Units Subcutaneous TID WC  . insulin aspart  0-5 Units Subcutaneous QHS  . insulin aspart  4 Units Subcutaneous TID WC  . losartan  50 mg Oral Daily  . metoprolol tartrate  25 mg Oral BID  . mirtazapine  15 mg Oral QHS  . pregabalin  150 mg Oral BID  . sodium chloride flush  3 mL Intravenous Q12H  . traZODone  100 mg Oral QHS   Continuous Infusions: . sodium chloride 75 mL/hr at 02/18/16 1425   PRN Meds:.alum & mag hydroxide-simeth, HYDROcodone-acetaminophen, ketorolac, ondansetron **OR** ondansetron (ZOFRAN) IV, senna-docusate   Assessment/Plan: #1. Cavitary pneumonia. Continue Primaxin and vancomycin. White count is up to 29.1. Blood cultures are pending. Consult pulmonology.  Recheck chest x-ray today with his shortness of breath overnight. #2. Diabetes. Continue sliding scale NovoLog. Glucose is 292. #3. Elevated troponins. Troponins have been 0.10, 0.11 and 0.12. Likely related to demand ischemia. #4. Elevated lactic acid. Concerning for sepsis. Recheck this morning. Initial lactic acid had been normal at 1.9 but increased to 3.5 yesterday. #5. Pneumatosis coli. Surgical consultation noted. Abdominal exam is benign this morning.  He denies any abdominal pain. #6. Morbid obesity. #7. Sacral decubitus. Principal Problem:   Cavitary pneumonia Active Problems:   Hemoptysis   Morbid obesity ([place])   Pneumatosis intestinalis   Pressure injury of skin     LOS: 1 day   Richard Fields 02/19/2016, 7:50 AM

## 2016-02-19 NOTE — Progress Notes (Signed)
Pharmacy Antibiotic Note  Richard Fields is a 75 y.o. male who is morbidly obese, admitted on 02/18/2016 with pneumonia.  Pharmacy has been consulted for VANCOMYCIN and PRIMAXIN dosing.  Pt is morbidly obese, will use NORMALIZED CLCR.  Plan: Re load Vancomycin 1500mg  iv x 1 then 1000mg  IV q8h (goal trough 15-20) Check trough at SS Primaxin 500mg  IV q6hrs Monitor labs, progress, c/s  Height: 5\' 11"  (180.3 cm) Weight: (!) 365 lb (165.6 kg) IBW/kg (Calculated) : 75.3  Temp (24hrs), Avg:99 F (37.2 C), Min:98.2 F (36.8 C), Max:100 F (37.8 C)   Recent Labs Lab 02/18/16 0517 02/18/16 0736 02/18/16 1047 02/19/16 0205  WBC 19.9*  --   --  29.1*  CREATININE 0.88  --   --  0.71  LATICACIDVEN  --  1.9 3.5*  --     Estimated Creatinine Clearance: 121.8 mL/min (by C-G formula based on SCr of 0.71 mg/dL).    Allergies  Allergen Reactions  . Penicillins Other (See Comments)    Unknown-reaction is unknown  . Tegretol [Carbamazepine] Itching   Antimicrobials this admission: Vancomycin 10/19 >>   Dose adjustments this admission:  Microbiology results: 02/18/16  BCx: pending   Thank you for allowing pharmacy to be a part of this patient's care.  Isac Sarna, BS Pharm D, [place] Clinical Pharmacist Pager [phone] 02/19/2016 7:58 AM

## 2016-02-20 ENCOUNTER — Inpatient Hospital Stay (HOSPITAL_COMMUNITY): Payer: Medicare Other | Admitting: Anesthesiology

## 2016-02-20 ENCOUNTER — Inpatient Hospital Stay (HOSPITAL_COMMUNITY): Payer: Medicare Other

## 2016-02-20 LAB — GLUCOSE, CAPILLARY
GLUCOSE-CAPILLARY: 404 mg/dL — AB (ref 65–99)
GLUCOSE-CAPILLARY: 327 mg/dL — AB (ref 65–99)
Glucose-Capillary: 419 mg/dL — ABNORMAL HIGH (ref 65–99)

## 2016-02-20 LAB — BLOOD GAS, ARTERIAL
ACID-BASE EXCESS: 4.3 mmol/L — AB (ref 0.0–2.0)
ACID-BASE EXCESS: 2.1 mmol/L — AB (ref 0.0–2.0)
BICARBONATE: 27.8 mmol/L (ref 20.0–28.0)
BICARBONATE: 25.6 mmol/L (ref 20.0–28.0)
DRAWN BY: 221791
Delivery systems: POSITIVE
Drawn by: 105551
EXPIRATORY PAP: 6
FIO2: 80
FIO2: 50
INSPIRATORY PAP: 15
LHR: 8 {breaths}/min
LHR: 16 {breaths}/min
MECHVT: 550 mL
O2 SAT: 96.4 %
O2 Saturation: 93.9 %
PCO2 ART: 46.9 mmHg (ref 32.0–48.0)
PEEP: 5 cmH2O
PO2 ART: 87.8 mmHg (ref 83.0–108.0)
PO2 ART: 76.3 mmHg — AB (ref 83.0–108.0)
Patient temperature: 37
pCO2 arterial: 50.2 mmHg — ABNORMAL HIGH (ref 32.0–48.0)
pH, Arterial: 7.404 (ref 7.350–7.450)
pH, Arterial: 7.351 (ref 7.350–7.450)

## 2016-02-20 LAB — BASIC METABOLIC PANEL
ANION GAP: 6 (ref 5–15)
BUN: 35 mg/dL — AB (ref 6–20)
CALCIUM: 8.6 mg/dL — AB (ref 8.9–10.3)
CO2: 27 mmol/L (ref 22–32)
Chloride: 106 mmol/L (ref 101–111)
Creatinine, Ser: 0.66 mg/dL (ref 0.61–1.24)
GFR calc Af Amer: >60 mL/min (ref >60)
GLUCOSE: 366 mg/dL — AB (ref 65–99)
POTASSIUM: 4.8 mmol/L (ref 3.5–5.1)
Sodium: 139 mmol/L (ref 135–145)

## 2016-02-20 LAB — CBC
HCT: 41.5 % (ref 39.0–52.0)
HEMOGLOBIN: 13.4 g/dL (ref 13.0–17.0)
MCH: 30.5 pg (ref 26.0–34.0)
MCHC: 32.3 g/dL (ref 30.0–36.0)
MCV: 94.5 fL (ref 78.0–100.0)
PLATELETS: 265 10*3/uL (ref 150–400)
RBC: 4.39 MIL/uL (ref 4.22–5.81)
RDW: 12.9 % (ref 11.5–15.5)
WBC: 24.4 10*3/uL — AB (ref 4.0–10.5)

## 2016-02-20 LAB — LACTIC ACID, PLASMA: Lactic Acid, Venous: 2.1 mmol/L (ref 0.5–1.9)

## 2016-02-20 MED ORDER — LORAZEPAM 1 MG PO TABS
1.0000 mg | ORAL_TABLET | Freq: Two times a day (BID) | ORAL | Status: DC
  Administered 2016-02-20 – 2016-03-06 (×24): 1 mg via ORAL
  Filled 2016-02-20 (×25): qty 1

## 2016-02-20 MED ORDER — SUCCINYLCHOLINE CHLORIDE 20 MG/ML IJ SOLN
INTRAMUSCULAR | Status: DC | PRN
  Administered 2016-02-20: 200 mg via INTRAVENOUS

## 2016-02-20 MED ORDER — FENTANYL CITRATE (PF) 100 MCG/2ML IJ SOLN
INTRAMUSCULAR | Status: AC
  Administered 2016-02-20: 50 ug
  Filled 2016-02-20: qty 2

## 2016-02-20 MED ORDER — SODIUM CHLORIDE 0.9 % IV BOLUS (SEPSIS): 500.0000 mL | Freq: Once | INTRAVENOUS | Status: DC

## 2016-02-20 MED ORDER — SODIUM CHLORIDE 0.9 % IV SOLN
INTRAVENOUS | Status: DC
  Administered 2016-02-20 – 2016-03-03 (×13): via INTRAVENOUS

## 2016-02-20 MED ORDER — CHLORHEXIDINE GLUCONATE 0.12% ORAL RINSE (MEDLINE KIT)
15.0000 mL | Freq: Two times a day (BID) | OROMUCOSAL | Status: DC
  Administered 2016-02-20 – 2016-02-23 (×7): 15 mL via OROMUCOSAL

## 2016-02-20 MED ORDER — METHYLPREDNISOLONE SODIUM SUCC 40 MG IJ SOLR
40.0000 mg | Freq: Two times a day (BID) | INTRAMUSCULAR | Status: DC
  Administered 2016-02-20 – 2016-03-01 (×20): 40 mg via INTRAVENOUS
  Filled 2016-02-20 (×20): qty 1

## 2016-02-20 MED ORDER — FENTANYL CITRATE (PF) 100 MCG/2ML IJ SOLN
50.0000 ug | INTRAMUSCULAR | Status: DC | PRN
  Administered 2016-02-20: 50 ug via INTRAVENOUS
  Filled 2016-02-20: qty 2

## 2016-02-20 MED ORDER — INSULIN ASPART 100 UNIT/ML ~~LOC~~ SOLN
0.0000 [IU] | SUBCUTANEOUS | Status: DC
  Administered 2016-02-20: 15 [IU] via SUBCUTANEOUS
  Administered 2016-02-20: 20 [IU] via SUBCUTANEOUS
  Administered 2016-02-21 (×2): 11 [IU] via SUBCUTANEOUS
  Administered 2016-02-21: 7 [IU] via SUBCUTANEOUS
  Administered 2016-02-21: 11 [IU] via SUBCUTANEOUS
  Administered 2016-02-21: 7 [IU] via SUBCUTANEOUS
  Administered 2016-02-21: 11 [IU] via SUBCUTANEOUS
  Administered 2016-02-22: 4 [IU] via SUBCUTANEOUS
  Administered 2016-02-22: 11 [IU] via SUBCUTANEOUS
  Administered 2016-02-22 (×2): 15 [IU] via SUBCUTANEOUS
  Administered 2016-02-22: 11 [IU] via SUBCUTANEOUS
  Administered 2016-02-22: 7 [IU] via SUBCUTANEOUS
  Administered 2016-02-23 (×3): 11 [IU] via SUBCUTANEOUS
  Administered 2016-02-23: 7 [IU] via SUBCUTANEOUS

## 2016-02-20 MED ORDER — SODIUM CHLORIDE 0.9 % IV BOLUS (SEPSIS)
500.0000 mL | Freq: Once | INTRAVENOUS | Status: AC
  Administered 2016-02-20: 500 mL via INTRAVENOUS

## 2016-02-20 MED ORDER — MIDAZOLAM HCL 2 MG/2ML IJ SOLN
1.0000 mg | INTRAMUSCULAR | Status: DC | PRN
  Administered 2016-02-21 (×2): 1 mg via INTRAVENOUS
  Filled 2016-02-20: qty 2

## 2016-02-20 MED ORDER — ORAL CARE MOUTH RINSE
15.0000 mL | Freq: Four times a day (QID) | OROMUCOSAL | Status: DC
  Administered 2016-02-20 – 2016-02-23 (×12): 15 mL via OROMUCOSAL

## 2016-02-20 MED ORDER — INSULIN ASPART 100 UNIT/ML ~~LOC~~ SOLN
0.0000 [IU] | SUBCUTANEOUS | Status: DC
  Administered 2016-02-20: 15 [IU] via SUBCUTANEOUS

## 2016-02-20 MED ORDER — MIDAZOLAM HCL 2 MG/2ML IJ SOLN
INTRAMUSCULAR | Status: AC
  Administered 2016-02-20: 2 mg
  Filled 2016-02-20: qty 2

## 2016-02-20 MED ORDER — FAMOTIDINE IN NACL 20-0.9 MG/50ML-% IV SOLN
20.0000 mg | Freq: Two times a day (BID) | INTRAVENOUS | Status: DC
  Administered 2016-02-20 – 2016-02-28 (×17): 20 mg via INTRAVENOUS
  Filled 2016-02-20 (×17): qty 50

## 2016-02-20 MED ORDER — ETOMIDATE 2 MG/ML IV SOLN
INTRAVENOUS | Status: DC | PRN
  Administered 2016-02-20: 16 mg via INTRAVENOUS

## 2016-02-20 MED ORDER — INSULIN GLARGINE 100 UNIT/ML ~~LOC~~ SOLN
20.0000 [IU] | Freq: Every day | SUBCUTANEOUS | Status: DC
  Administered 2016-02-20 – 2016-02-22 (×3): 20 [IU] via SUBCUTANEOUS
  Filled 2016-02-20 (×4): qty 0.2

## 2016-02-20 MED ORDER — MIDAZOLAM HCL 2 MG/2ML IJ SOLN
1.0000 mg | INTRAMUSCULAR | Status: DC | PRN
  Administered 2016-02-20 – 2016-02-22 (×2): 1 mg via INTRAVENOUS
  Filled 2016-02-20 (×3): qty 2

## 2016-02-20 MED ORDER — FENTANYL CITRATE (PF) 100 MCG/2ML IJ SOLN: 50.0000 ug | INTRAMUSCULAR | Status: DC | PRN

## 2016-02-20 MED ORDER — SODIUM CHLORIDE 0.9 % IV SOLN
10.0000 ug/h | INTRAVENOUS | Status: DC
  Administered 2016-02-20: 25 ug/h via INTRAVENOUS
  Administered 2016-02-22: 175 ug/h via INTRAVENOUS
  Filled 2016-02-20 (×2): qty 50

## 2016-02-20 NOTE — Progress Notes (Signed)
Subjective: He developed increased tachypnea and dyspnea yesterday afternoon and was moved to the ICU. He has been treated with BiPAP. Respiratory rate is improved. He denies pain now  Objective: Vital signs in last 24 hours: Vitals:   02/20/16 0422 02/20/16 0734 02/20/16 0741 02/20/16 0751  BP:  (!) 131/93    Pulse: (!) 115     Resp: (!) 28 (!) 23    Temp:    99.2 F (37.3 C)  TempSrc:    Axillary  SpO2: 97% 100% 96%   Weight:      Height:       Weight change: -53 lb 14.9 oz (-24.463 kg)  Intake/Output Summary (Last 24 hours) at 02/20/16 0811 Last data filed at 02/20/16 0500  Gross per 24 hour  Intake             1905 ml  Output              750 ml  Net             1155 ml    Physical Exam: Calm at present. Remains confused. Lungs reveal mild rhonchi. Heart regular with rates in the 80s. Abdomen soft and nondistended. No abdominal tenderness on palpation. Extremities reveal no edema.  Lab Results:    Results for orders placed or performed during the hospital encounter of 02/18/16 (from the past 24 hour(s))  Glucose, capillary     Status: Abnormal   Collection Time: 02/19/16 11:54 AM  Result Value Ref Range   Glucose-Capillary 311 (H) 65 - 99 mg/dL   Comment 1 Notify RN    Comment 2 Document in Chart   Glucose, capillary     Status: Abnormal   Collection Time: 02/19/16  4:00 PM  Result Value Ref Range   Glucose-Capillary 328 (H) 65 - 99 mg/dL   Comment 1 Notify RN    Comment 2 Document in Chart   MRSA PCR Screening     Status: Abnormal   Collection Time: 02/19/16  4:47 PM  Result Value Ref Range   MRSA by PCR POSITIVE (A) NEGATIVE  Glucose, capillary     Status: Abnormal   Collection Time: 02/19/16  6:29 PM  Result Value Ref Range   Glucose-Capillary 317 (H) 65 - 99 mg/dL  Glucose, capillary     Status: Abnormal   Collection Time: 02/19/16  9:49 PM  Result Value Ref Range   Glucose-Capillary 326 (H) 65 - 99 mg/dL   Comment 1 Notify RN    Comment 2 Document  in Chart   Blood gas, arterial     Status: Abnormal   Collection Time: 02/20/16  4:40 AM  Result Value Ref Range   FIO2 80.00    Delivery systems BILEVEL POSITIVE AIRWAY PRESSURE    Mode OXYGEN MASK    LHR 8 resp/min   Inspiratory PAP 15    Expiratory PAP 6.0    pH, Arterial 7.404 7.350 - 7.450   pCO2 arterial 46.9 32.0 - 48.0 mmHg   pO2, Arterial 87.8 83.0 - 108.0 mmHg   Bicarbonate 27.8 20.0 - 28.0 mmol/L   Acid-Base Excess 4.3 (H) 0.0 - 2.0 mmol/L   O2 Saturation 96.4 %   Collection site RADIAL    Drawn by [id number]    Sample type ARTERIAL    Allens test (pass/fail) PASS PASS  CBC     Status: Abnormal   Collection Time: 02/20/16  5:09 AM  Result Value Ref Range   WBC 24.4 (H)  4.0 - 10.5 K/uL   RBC 4.39 4.22 - 5.81 MIL/uL   Hemoglobin 13.4 13.0 - 17.0 g/dL   HCT 41.5 39.0 - 52.0 %   MCV 94.5 78.0 - 100.0 fL   MCH 30.5 26.0 - 34.0 pg   MCHC 32.3 30.0 - 36.0 g/dL   RDW 12.9 11.5 - 15.5 %   Platelets 265 150 - 400 K/uL  Basic metabolic panel     Status: Abnormal   Collection Time: 02/20/16  6:32 AM  Result Value Ref Range   Sodium 139 135 - 145 mmol/L   Potassium 4.8 3.5 - 5.1 mmol/L   Chloride 106 101 - 111 mmol/L   CO2 27 22 - 32 mmol/L   Glucose, Bld 366 (H) 65 - 99 mg/dL   BUN 35 (H) 6 - 20 mg/dL   Creatinine, Ser 0.66 0.61 - 1.24 mg/dL   Calcium 8.6 (L) 8.9 - 10.3 mg/dL   GFR calc non Af Amer >60 >60 mL/min   GFR calc Af Amer >60 >60 mL/min   Anion gap 6 5 - 15     ABGS  Recent Labs  02/20/16 0440  PHART 7.404  PO2ART 87.8  HCO3 27.8   CULTURES Recent Results (from the past 240 hour(s))  Blood culture (routine x 2)     Status: None (Preliminary result)   Collection Time: 02/18/16  7:36 AM  Result Value Ref Range Status   Specimen Description BLOOD RIGHT HAND  Final   Special Requests   Final    BOTTLES DRAWN AEROBIC AND ANAEROBIC AEB=8CC ANA=6CC   Culture NO GROWTH <12 HOURS  Final   Report Status PENDING  Incomplete  Blood culture (routine x 2)      Status: None (Preliminary result)   Collection Time: 02/18/16  7:42 AM  Result Value Ref Range Status   Specimen Description BLOOD LEFT ANTECUBITAL  Final   Special Requests BOTTLES DRAWN AEROBIC AND ANAEROBIC 6CC  Final   Culture NO GROWTH <12 HOURS  Final   Report Status PENDING  Incomplete  MRSA PCR Screening     Status: Abnormal   Collection Time: 02/19/16  4:47 PM  Result Value Ref Range Status   MRSA by PCR POSITIVE (A) NEGATIVE Final    Comment:        The GeneXpert MRSA Assay (FDA approved for NASAL specimens only), is one component of a comprehensive MRSA colonization surveillance program. It is not intended to diagnose MRSA infection nor to guide or monitor treatment for MRSA infections. CRITICAL RESULT CALLED TO, READ BACK BY AND VERIFIED WITH: PARIS,C AT 2342 ON 02/19/2016 BY WOODS,M    Studies/Results: Dg Chest 1 View  Result Date: 02/19/2016 CLINICAL DATA:  Shortness of breath for several weeks EXAM: CHEST 1 VIEW COMPARISON:  02/18/2016 FINDINGS: Cardiac shadow is enlarged. Right-sided pleural effusion is increasing from the prior exam. No focal infiltrate is seen in the left lung. No bony abnormality is noted. IMPRESSION: Increasing right-sided pleural effusion. Electronically Signed   By: Inez Catalina M.D.   On: 02/19/2016 09:28   [place] Chest  Result Date: 02/19/2016 CLINICAL DATA:  Abnormal chest radiograph, RIGHT pleural effusion EXAM: CHEST ULTRASOUND COMPARISON:  Chest radiograph 02/19/2016 FINDINGS: Only a minimal amount of RIGHT pleural fluid is identified. Consolidated lower RIGHT lung is seen. Volume of fluid visualized is insufficient for thoracentesis. IMPRESSION: RIGHT lower lobe consolidation. Minimal RIGHT pleural effusion. Discussed with Dr. Luan Pulling. Electronically Signed   By: Crist Infante.D.  On: 02/19/2016 14:07   Ct Angio Abd/pel W And/or Wo Contrast  Addendum Date: 02/18/2016   ADDENDUM REPORT: 02/18/2016 12:41 ADDENDUM: Small right pleural  effusion with compressive atelectasis at the right lung base. There is a small focus of low-density and gas within this collapsed right lower lobe. This is seen on sequence 5, image 9. This area measures roughly 2.2 cm. This could represent a small focus of lung necrosis or cavitary pneumonia. This was seen on the recent chest CT from 02/18/2016. Electronically Signed   By: Markus Daft M.D.   On: 02/18/2016 12:41   Result Date: 02/18/2016 CLINICAL DATA:  Follow-up colonic pneumatosis. EXAM: CT ANGIOGRAPHY ABDOMEN AND PELVIS WITH CONTRAST AND WITHOUT CONTRAST TECHNIQUE: Multidetector CT imaging of the abdomen and pelvis was performed using the standard protocol during bolus administration of intravenous contrast. Multiplanar reconstructed images and MIPs were obtained and reviewed to evaluate the vascular anatomy. CONTRAST:  148mL ISOVUE-300 IOPAMIDOL (ISOVUE-300) INJECTION 61% COMPARISON:  CT 02/18/2016 FINDINGS: VASCULAR Aorta: Poor opacification of the arterial structures due to technical difficulties with this examination. Normal caliber of the abdominal aorta with atherosclerotic disease. No evidence for an aortic dissection. Celiac: Celiac trunk is patent without significant stent stenosis at the origin. The main branch vessels are patent. SMA: The SMA is patent with some atherosclerotic plaque at the origin. There does not appear to be a critical stenosis but limited evaluation. Renals: Bilateral renal arteries are patent with calcified plaque at the origin. There does not appear to be critical stenosis but limited evaluation. IMA: Proximal IMA appears to be patent. Inflow: Iliac arteries are calcified without significant aneurysm. Iliac arteries and proximal femoral arteries appear to be patent. Veins: IVC, renal veins and portal venous system are patent. Review of the MIP images confirms the above findings. NON-VASCULAR Lower chest: Again noted is atelectasis and/or scarring at the left lung base. Small  right pleural effusion with compressive atelectasis in the right lower lobe. Coronary artery calcifications. Hepatobiliary: Normal appearance of the liver, gallbladder and portal venous system. Pancreas: Normal appearance of the pancreas without inflammation or duct dilatation. Spleen: Normal appearance of spleen without enlargement. Adrenals/Urinary Tract: Normal adrenal glands. Probable cyst in the left kidney interpolar region without hydronephrosis. Normal appearance of the right kidney without hydronephrosis. Moderate distention of the urinary bladder without gross abnormality. Stomach/Bowel: Again noted is pneumatosis involving the transverse colon and a segment of the descending colon. There is no significant colonic wall thickening. No evidence for small bowel dilatation or obstruction. Cecum is located in the right upper abdomen and difficult to exclude some pneumatosis involving the cecum. Lymphatic: No significant lymphadenopathy in the abdomen or pelvis. Reproductive: Prostate is unremarkable. Other: No evidence for abdominal or pelvic ascites. There is no free intraperitoneal air. No evidence for portal gas. 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 There is a large lucency involving the L4 vertebral body which may be related to a Schmorl's node. There is also large low-density structure in the posterior aspect of the L3 vertebral body which could be related to a large Schmorl's node. IMPRESSION: VASCULAR Limited evaluation of the aorta and visceral arteries due to poor contrast opacification and technical issues with this examination. No gross abnormality to the aorta such as an aneurysm or dissection. Main visceral arteries appear to be patent without significant stenosis. NON-VASCULAR Stable colonic pneumatosis. The pneumatosis is involving the transverse colon and a segment of the descending colon. No significant change from  the previous examination and  etiology is unknown. No focal bowel wall thickening. No free intraperitoneal air. Again noted are lucent lesions within the L3 and L4 vertebral bodies. These could be related to very large Schmorl's nodes but indeterminate. Electronically Signed: By: Markus Daft M.D. On: 02/18/2016 12:22   Micro Results: Recent Results (from the past 240 hour(s))  Blood culture (routine x 2)     Status: None (Preliminary result)   Collection Time: 02/18/16  7:36 AM  Result Value Ref Range Status   Specimen Description BLOOD RIGHT HAND  Final   Special Requests   Final    BOTTLES DRAWN AEROBIC AND ANAEROBIC AEB=8CC ANA=6CC   Culture NO GROWTH <12 HOURS  Final   Report Status PENDING  Incomplete  Blood culture (routine x 2)     Status: None (Preliminary result)   Collection Time: 02/18/16  7:42 AM  Result Value Ref Range Status   Specimen Description BLOOD LEFT ANTECUBITAL  Final   Special Requests BOTTLES DRAWN AEROBIC AND ANAEROBIC 6CC  Final   Culture NO GROWTH <12 HOURS  Final   Report Status PENDING  Incomplete  MRSA PCR Screening     Status: Abnormal   Collection Time: 02/19/16  4:47 PM  Result Value Ref Range Status   MRSA by PCR POSITIVE (A) NEGATIVE Final    Comment:        The GeneXpert MRSA Assay (FDA approved for NASAL specimens only), is one component of a comprehensive MRSA colonization surveillance program. It is not intended to diagnose MRSA infection nor to guide or monitor treatment for MRSA infections. CRITICAL RESULT CALLED TO, READ BACK BY AND VERIFIED WITH: [place],C AT 2342 ON 02/19/2016 BY WOODS,M    Studies/Results: Dg Chest 1 View  Result Date: 02/19/2016 CLINICAL DATA:  Shortness of breath for several weeks EXAM: CHEST 1 VIEW COMPARISON:  02/18/2016 FINDINGS: Cardiac shadow is enlarged. Right-sided pleural effusion is increasing from the prior exam. No focal infiltrate is seen in the left lung. No bony abnormality is noted. IMPRESSION: Increasing right-sided pleural  effusion. Electronically Signed   By: Inez Catalina M.D.   On: 02/19/2016 09:28   [place] Chest  Result Date: 02/19/2016 CLINICAL DATA:  Abnormal chest radiograph, RIGHT pleural effusion EXAM: CHEST ULTRASOUND COMPARISON:  Chest radiograph 02/19/2016 FINDINGS: Only a minimal amount of RIGHT pleural fluid is identified. Consolidated lower RIGHT lung is seen. Volume of fluid visualized is insufficient for thoracentesis. IMPRESSION: RIGHT lower lobe consolidation. Minimal RIGHT pleural effusion. Discussed with Dr. Luan Pulling. Electronically Signed   By: Lavonia Dana M.D.   On: 02/19/2016 14:07   Ct Angio Abd/pel W And/or Wo Contrast  Addendum Date: 02/18/2016   ADDENDUM REPORT: 02/18/2016 12:41 ADDENDUM: Small right pleural effusion with compressive atelectasis at the right lung base. There is a small focus of low-density and gas within this collapsed right lower lobe. This is seen on sequence 5, image 9. This area measures roughly 2.2 cm. This could represent a small focus of lung necrosis or cavitary pneumonia. This was seen on the recent chest CT from 02/18/2016. Electronically Signed   By: Markus Daft M.D.   On: 02/18/2016 12:41   Result Date: 02/18/2016 CLINICAL DATA:  Follow-up colonic pneumatosis. EXAM: CT ANGIOGRAPHY ABDOMEN AND PELVIS WITH CONTRAST AND WITHOUT CONTRAST TECHNIQUE: Multidetector CT imaging of the abdomen and pelvis was performed using the standard protocol during bolus administration of intravenous contrast. Multiplanar reconstructed images and MIPs were obtained and reviewed to evaluate the vascular  anatomy. CONTRAST:  165mL ISOVUE-300 IOPAMIDOL (ISOVUE-300) INJECTION 61% COMPARISON:  CT 02/18/2016 FINDINGS: VASCULAR Aorta: Poor opacification of the arterial structures due to technical difficulties with this examination. Normal caliber of the abdominal aorta with atherosclerotic disease. No evidence for an aortic dissection. Celiac: Celiac trunk is patent without significant stent stenosis at  the origin. The main branch vessels are patent. SMA: The SMA is patent with some atherosclerotic plaque at the origin. There does not appear to be a critical stenosis but limited evaluation. Renals: Bilateral renal arteries are patent with calcified plaque at the origin. There does not appear to be critical stenosis but limited evaluation. IMA: Proximal IMA appears to be patent. Inflow: Iliac arteries are calcified without significant aneurysm. Iliac arteries and proximal femoral arteries appear to be patent. Veins: IVC, renal veins and portal venous system are patent. Review of the MIP images confirms the above findings. NON-VASCULAR Lower chest: Again noted is atelectasis and/or scarring at the left lung base. Small right pleural effusion with compressive atelectasis in the right lower lobe. Coronary artery calcifications. Hepatobiliary: Normal appearance of the liver, gallbladder and portal venous system. Pancreas: Normal appearance of the pancreas without inflammation or duct dilatation. Spleen: Normal appearance of spleen without enlargement. Adrenals/Urinary Tract: Normal adrenal glands. Probable cyst in the left kidney interpolar region without hydronephrosis. Normal appearance of the right kidney without hydronephrosis. Moderate distention of the urinary bladder without gross abnormality. Stomach/Bowel: Again noted is pneumatosis involving the transverse colon and a segment of the descending colon. There is no significant colonic wall thickening. No evidence for small bowel dilatation or obstruction. Cecum is located in the right upper abdomen and difficult to exclude some pneumatosis involving the cecum. Lymphatic: No significant lymphadenopathy in the abdomen or pelvis. Reproductive: Prostate is unremarkable. Other: No evidence for abdominal or pelvic ascites. There is no free intraperitoneal air. No evidence for portal gas. Musculoskeletal: Grade 1 anterolisthesis at L5-S1 with bilateral pars defects at  L5. There is a large lucency involving the L4 vertebral body which may be related to a Schmorl's node. There is also large low-density structure in the posterior aspect of the L3 vertebral body which could be related to a large Schmorl's node. IMPRESSION: VASCULAR Limited evaluation of the aorta and visceral arteries due to poor contrast opacification and technical issues with this examination. No gross abnormality to the aorta such as an aneurysm or dissection. Main visceral arteries appear to be patent without significant stenosis. NON-VASCULAR Stable colonic pneumatosis. The pneumatosis is involving the transverse colon and a segment of the descending colon. No significant change from the previous examination and etiology is unknown. No focal bowel wall thickening. No free intraperitoneal air. Again noted are lucent lesions within the L3 and L4 vertebral bodies. These could be related to very large Schmorl's nodes but indeterminate. Electronically Signed: By: Markus Daft M.D. On: 02/18/2016 12:22   Medications:  I have reviewed the patient's current medications Scheduled Meds: . albuterol  2.5 mg Nebulization QID  . Chlorhexidine Gluconate Cloth  6 each Topical Q0600  . heparin  5,000 Units Subcutaneous Q8H  . imipenem-cilastatin  500 mg Intravenous Q6H  . insulin aspart  0-15 Units Subcutaneous TID WC  . insulin aspart  0-5 Units Subcutaneous QHS  . insulin aspart  4 Units Subcutaneous TID WC  . insulin glargine  20 Units Subcutaneous Daily  . LORazepam  1 mg Oral BID  . losartan  50 mg Oral Daily  . methylPREDNISolone (SOLU-MEDROL) injection  20  mg Intravenous Q12H  . mirtazapine  15 mg Oral QHS  . mupirocin ointment  1 application Nasal BID  . pregabalin  150 mg Oral BID  . sodium chloride flush  3 mL Intravenous Q12H  . traZODone  100 mg Oral QHS  . vancomycin  1,000 mg Intravenous Q8H   Continuous Infusions: . sodium chloride 75 mL/hr at 02/18/16 1425  . sodium chloride     PRN  Meds:.albuterol, alum & mag hydroxide-simeth, HYDROcodone-acetaminophen, ketorolac, ondansetron **OR** ondansetron (ZOFRAN) IV, senna-docusate   Assessment/Plan: #1. Pneumonia. Continue IV antibiotic therapy. Leukocytosis persists but is improved at 24.4.  Thoracentesis could not be performed yesterday since there appeared to be no significant fluid in the pleural space. Continue BiPAP. #2. Diabetes. Glucose 366. Start Lantus 20 units daily. Continue NovoLog. #3. Azotemia. Start IV fluids. BUN 35 with a creatinine of 0.66. Echo reveals normal LV function with no wall motion abnormalities. #4. Chronic benzodiazepine use. Restart Ativan twice daily. He had taken at least 2 mg daily at home. Principal Problem:   Cavitary pneumonia Active Problems:   Hemoptysis   Morbid obesity ([place])   Pneumatosis intestinalis   Pressure injury of skin     LOS: 2 days   [place] Winslow 02/20/2016, 8:11 AM

## 2016-02-20 NOTE — Anesthesia Procedure Notes (Signed)
Procedure Name: Intubation Date/Time: 02/20/2016 10:26 AM Performed by: Charmaine Downs Pre-anesthesia Checklist: Patient identified, Emergency Drugs available, Suction available, Patient being monitored and Timeout performed Patient Re-evaluated:Patient Re-evaluated prior to inductionOxygen Delivery Method: Ambu bag Preoxygenation: Pre-oxygenation with 100% oxygen Intubation Type: IV induction, Cricoid Pressure applied and Rapid sequence Ventilation: Oral airway inserted - appropriate to patient size Laryngoscope Size: Mac and 3 Grade View: Grade III Tube type: Subglottic suction tube Tube size: 8.0 mm Number of attempts: 1 Airway Equipment and Method: Stylet Placement Confirmation: positive ETCO2,  CO2 detector and breath sounds checked- equal and bilateral Secured at: 22 cm Tube secured with: Tape Dental Injury: Teeth and Oropharynx as per pre-operative assessment  Difficulty Due To: Difficulty was anticipated, Difficult Airway- due to anterior larynx and Difficult Airway- due to dentition Future Recommendations: Recommend- induction with short-acting agent, and alternative techniques readily available

## 2016-02-20 NOTE — Progress Notes (Addendum)
Subjective: He was moved to stepdown overnight. He is on BiPAP but still breathing 30 times a minute. Chest x-ray and blood gas are pending but he looks like he's probably septic. He has tachycardia and his lactate level was elevated yesterday afternoon.  Objective: Vital signs in last 24 hours: Temp:  [98.3 F (36.8 C)-99.4 F (37.4 C)] 99.2 F (37.3 C) (10/21 0751) Pulse Rate:  [84-115] 109 (10/21 0800) Resp:  [20-31] 30 (10/21 0800) BP: (131-171)/(84-98) 139/92 (10/21 0800) SpO2:  [0 %-100 %] 96 % (10/21 0800) FiO2 (%):  [45 %-80 %] 80 % (10/21 0734) Weight:  [141.1 kg (311 lb 1.1 oz)] 141.1 kg (311 lb 1.1 oz) (10/21 0400) Weight change: -24.463 kg (-53 lb 14.9 oz) Last BM Date: 02/17/16  Intake/Output from previous day: 10/20 0701 - 10/21 0700 In: 1905 [P.O.:240; I.V.:765; IV Piggyback:900] Out: 750 [Urine:750]  PHYSICAL EXAM General appearance: He appears to be in respiratory distress. His heart rate is about 110 he is confused and very tachypneic Resp: rhonchi bilaterally and wheezes bilaterally Cardio: regular rate and rhythm, S1, S2 normal, no murmur, click, rub or gallop and With tachycardia GI: soft, non-tender; bowel sounds normal; no masses,  no organomegaly Extremities: No edema. Chronic venous stasis changes Poorly responsive. Still mildly agitated. Pupils react.  Lab Results:  Results for orders placed or performed during the hospital encounter of 02/18/16 (from the past 48 hour(s))  Lactic acid, plasma     Status: Abnormal   Collection Time: 02/18/16 10:47 AM  Result Value Ref Range   Lactic Acid, Venous 3.5 (HH) 0.5 - 1.9 mmol/L    Comment: CRITICAL RESULT CALLED TO, READ BACK BY AND VERIFIED WITH: MINTER,R AT 11:30AM ON 02/18/16 BY FESTERMAN,C   Urinalysis, Routine w reflex microscopic     Status: Abnormal   Collection Time: 02/18/16 11:29 AM  Result Value Ref Range   Color, Urine YELLOW YELLOW   APPearance CLEAR CLEAR   Specific Gravity, Urine 1.015  1.005 - 1.030   pH 6.0 5.0 - 8.0   Glucose, UA 500 (A) NEGATIVE mg/dL   Hgb urine dipstick NEGATIVE NEGATIVE   Bilirubin Urine NEGATIVE NEGATIVE   Ketones, ur 15 (A) NEGATIVE mg/dL   Protein, ur TRACE (A) NEGATIVE mg/dL   Nitrite NEGATIVE NEGATIVE   Leukocytes, UA NEGATIVE NEGATIVE  Urine microscopic-add on     Status: None   Collection Time: 02/18/16 11:29 AM  Result Value Ref Range   Squamous Epithelial / LPF NONE SEEN NONE SEEN   WBC, UA 0-5 0 - 5 WBC/hpf   RBC / HPF NONE SEEN 0 - 5 RBC/hpf   Bacteria, UA NONE SEEN NONE SEEN  Troponin I (q 6hr x 3)     Status: Abnormal   Collection Time: 02/18/16  2:30 PM  Result Value Ref Range   Troponin I 0.10 (HH) <0.03 ng/mL    Comment: CRITICAL VALUE NOTED.  VALUE IS CONSISTENT WITH PREVIOUSLY REPORTED AND CALLED VALUE.  Glucose, capillary     Status: Abnormal   Collection Time: 02/18/16  4:23 PM  Result Value Ref Range   Glucose-Capillary 255 (H) 65 - 99 mg/dL   Comment 1 Notify RN    Comment 2 Document in Chart   Troponin I (q 6hr x 3)     Status: Abnormal   Collection Time: 02/18/16  7:58 PM  Result Value Ref Range   Troponin I 0.11 (HH) <0.03 ng/mL    Comment: CRITICAL VALUE NOTED.  VALUE IS CONSISTENT  WITH PREVIOUSLY REPORTED AND CALLED VALUE.  Glucose, capillary     Status: Abnormal   Collection Time: 02/18/16  9:25 PM  Result Value Ref Range   Glucose-Capillary 258 (H) 65 - 99 mg/dL  Basic metabolic panel     Status: Abnormal   Collection Time: 02/19/16  2:05 AM  Result Value Ref Range   Sodium 136 135 - 145 mmol/L   Potassium 4.3 3.5 - 5.1 mmol/L   Chloride 101 101 - 111 mmol/L   CO2 27 22 - 32 mmol/L   Glucose, Bld 292 (H) 65 - 99 mg/dL   BUN 15 6 - 20 mg/dL   Creatinine, Ser 0.71 0.61 - 1.24 mg/dL   Calcium 8.6 (L) 8.9 - 10.3 mg/dL   GFR calc non Af Amer >60 >60 mL/min   GFR calc Af Amer >60 >60 mL/min    Comment: (NOTE) The eGFR has been calculated using the CKD EPI equation. This calculation has not been  validated in all clinical situations. eGFR's persistently <60 mL/min signify possible Chronic Kidney Disease.    Anion gap 8 5 - 15  CBC     Status: Abnormal   Collection Time: 02/19/16  2:05 AM  Result Value Ref Range   WBC 29.1 (H) 4.0 - 10.5 K/uL    Comment: REPEATED TO VERIFY   RBC 4.51 4.22 - 5.81 MIL/uL   Hemoglobin 13.9 13.0 - 17.0 g/dL   HCT 42.1 39.0 - 52.0 %   MCV 93.3 78.0 - 100.0 fL   MCH 30.8 26.0 - 34.0 pg   MCHC 33.0 30.0 - 36.0 g/dL   RDW 12.9 11.5 - 15.5 %   Platelets 274 150 - 400 K/uL  Troponin I     Status: Abnormal   Collection Time: 02/19/16  2:05 AM  Result Value Ref Range   Troponin I 0.12 (HH) <0.03 ng/mL    Comment: CRITICAL VALUE NOTED.  VALUE IS CONSISTENT WITH PREVIOUSLY REPORTED AND CALLED VALUE.  Glucose, capillary     Status: Abnormal   Collection Time: 02/19/16  7:07 AM  Result Value Ref Range   Glucose-Capillary 295 (H) 65 - 99 mg/dL   Comment 1 Notify RN    Comment 2 Document in Chart   Glucose, capillary     Status: Abnormal   Collection Time: 02/19/16 11:54 AM  Result Value Ref Range   Glucose-Capillary 311 (H) 65 - 99 mg/dL   Comment 1 Notify RN    Comment 2 Document in Chart   Glucose, capillary     Status: Abnormal   Collection Time: 02/19/16  4:00 PM  Result Value Ref Range   Glucose-Capillary 328 (H) 65 - 99 mg/dL   Comment 1 Notify RN    Comment 2 Document in Chart   MRSA PCR Screening     Status: Abnormal   Collection Time: 02/19/16  4:47 PM  Result Value Ref Range   MRSA by PCR POSITIVE (A) NEGATIVE    Comment:        The GeneXpert MRSA Assay (FDA approved for NASAL specimens only), is one component of a comprehensive MRSA colonization surveillance program. It is not intended to diagnose MRSA infection nor to guide or monitor treatment for MRSA infections. CRITICAL RESULT CALLED TO, READ BACK BY AND VERIFIED WITH: PARIS,C AT 2342 ON 02/19/2016 BY WOODS,M   Glucose, capillary     Status: Abnormal   Collection Time:  02/19/16  6:29 PM  Result Value Ref Range   Glucose-Capillary 317 (  H) 65 - 99 mg/dL  Glucose, capillary     Status: Abnormal   Collection Time: 02/19/16  9:49 PM  Result Value Ref Range   Glucose-Capillary 326 (H) 65 - 99 mg/dL   Comment 1 Notify RN    Comment 2 Document in Chart   Blood gas, arterial     Status: Abnormal   Collection Time: 02/20/16  4:40 AM  Result Value Ref Range   FIO2 80.00    Delivery systems BILEVEL POSITIVE AIRWAY PRESSURE    Mode OXYGEN MASK    LHR 8 resp/min   Inspiratory PAP 15    Expiratory PAP 6.0    pH, Arterial 7.404 7.350 - 7.450   pCO2 arterial 46.9 32.0 - 48.0 mmHg   pO2, Arterial 87.8 83.0 - 108.0 mmHg   Bicarbonate 27.8 20.0 - 28.0 mmol/L   Acid-Base Excess 4.3 (H) 0.0 - 2.0 mmol/L   O2 Saturation 96.4 %   Collection site RADIAL    Drawn by [id number]    Sample type ARTERIAL    Allens test (pass/fail) PASS PASS  CBC     Status: Abnormal   Collection Time: 02/20/16  5:09 AM  Result Value Ref Range   WBC 24.4 (H) 4.0 - 10.5 K/uL   RBC 4.39 4.22 - 5.81 MIL/uL   Hemoglobin 13.4 13.0 - 17.0 g/dL   HCT 41.5 39.0 - 52.0 %   MCV 94.5 78.0 - 100.0 fL   MCH 30.5 26.0 - 34.0 pg   MCHC 32.3 30.0 - 36.0 g/dL   RDW 12.9 11.5 - 15.5 %   Platelets 265 150 - 400 K/uL  Basic metabolic panel     Status: Abnormal   Collection Time: 02/20/16  6:32 AM  Result Value Ref Range   Sodium 139 135 - 145 mmol/L   Potassium 4.8 3.5 - 5.1 mmol/L   Chloride 106 101 - 111 mmol/L   CO2 27 22 - 32 mmol/L   Glucose, Bld 366 (H) 65 - 99 mg/dL   BUN 35 (H) 6 - 20 mg/dL   Creatinine, Ser 0.66 0.61 - 1.24 mg/dL   Calcium 8.6 (L) 8.9 - 10.3 mg/dL   GFR calc non Af Amer >60 >60 mL/min   GFR calc Af Amer >60 >60 mL/min    Comment: (NOTE) The eGFR has been calculated using the CKD EPI equation. This calculation has not been validated in all clinical situations. eGFR's persistently <60 mL/min signify possible Chronic Kidney Disease.    Anion gap 6 5 - 15     ABGS  Recent Labs  02/20/16 0440  PHART 7.404  PO2ART 87.8  HCO3 27.8   CULTURES Recent Results (from the past 240 hour(s))  Blood culture (routine x 2)     Status: None (Preliminary result)   Collection Time: 02/18/16  7:36 AM  Result Value Ref Range Status   Specimen Description BLOOD RIGHT HAND  Final   Special Requests   Final    BOTTLES DRAWN AEROBIC AND ANAEROBIC AEB=8CC ANA=6CC   Culture NO GROWTH 2 DAYS  Final   Report Status PENDING  Incomplete  Blood culture (routine x 2)     Status: None (Preliminary result)   Collection Time: 02/18/16  7:42 AM  Result Value Ref Range Status   Specimen Description BLOOD LEFT ANTECUBITAL  Final   Special Requests BOTTLES DRAWN AEROBIC AND ANAEROBIC 6CC  Final   Culture NO GROWTH 2 DAYS  Final   Report Status PENDING  Incomplete  MRSA PCR Screening     Status: Abnormal   Collection Time: 02/19/16  4:47 PM  Result Value Ref Range Status   MRSA by PCR POSITIVE (A) NEGATIVE Final    Comment:        The GeneXpert MRSA Assay (FDA approved for NASAL specimens only), is one component of a comprehensive MRSA colonization surveillance program. It is not intended to diagnose MRSA infection nor to guide or monitor treatment for MRSA infections. CRITICAL RESULT CALLED TO, READ BACK BY AND VERIFIED WITH: PARIS,C AT 2342 ON 02/19/2016 BY WOODS,M    Studies/Results: Dg Chest 1 View  Result Date: 02/19/2016 CLINICAL DATA:  Shortness of breath for several weeks EXAM: CHEST 1 VIEW COMPARISON:  02/18/2016 FINDINGS: Cardiac shadow is enlarged. Right-sided pleural effusion is increasing from the prior exam. No focal infiltrate is seen in the left lung. No bony abnormality is noted. IMPRESSION: Increasing right-sided pleural effusion. Electronically Signed   By: Inez Catalina M.D.   On: 02/19/2016 09:28   [place] Chest  Result Date: 02/19/2016 CLINICAL DATA:  Abnormal chest radiograph, RIGHT pleural effusion EXAM: CHEST ULTRASOUND COMPARISON:   Chest radiograph 02/19/2016 FINDINGS: Only a minimal amount of RIGHT pleural fluid is identified. Consolidated lower RIGHT lung is seen. Volume of fluid visualized is insufficient for thoracentesis. IMPRESSION: RIGHT lower lobe consolidation. Minimal RIGHT pleural effusion. Discussed with Dr. Luan Pulling. Electronically Signed   By: Lavonia Dana M.D.   On: 02/19/2016 14:07   Ct Angio Abd/pel W And/or Wo Contrast  Addendum Date: 02/18/2016   ADDENDUM REPORT: 02/18/2016 12:41 ADDENDUM: Small right pleural effusion with compressive atelectasis at the right lung base. There is a small focus of low-density and gas within this collapsed right lower lobe. This is seen on sequence 5, image 9. This area measures roughly 2.2 cm. This could represent a small focus of lung necrosis or cavitary pneumonia. This was seen on the recent chest CT from 02/18/2016. Electronically Signed   By: Markus Daft M.D.   On: 02/18/2016 12:41   Result Date: 02/18/2016 CLINICAL DATA:  Follow-up colonic pneumatosis. EXAM: CT ANGIOGRAPHY ABDOMEN AND PELVIS WITH CONTRAST AND WITHOUT CONTRAST TECHNIQUE: Multidetector CT imaging of the abdomen and pelvis was performed using the standard protocol during bolus administration of intravenous contrast. Multiplanar reconstructed images and MIPs were obtained and reviewed to evaluate the vascular anatomy. CONTRAST:  127m ISOVUE-300 IOPAMIDOL (ISOVUE-300) INJECTION 61% COMPARISON:  CT 02/18/2016 FINDINGS: VASCULAR Aorta: Poor opacification of the arterial structures due to technical difficulties with this examination. Normal caliber of the abdominal aorta with atherosclerotic disease. No evidence for an aortic dissection. Celiac: Celiac trunk is patent without significant stent stenosis at the origin. The main branch vessels are patent. SMA: The SMA is patent with some atherosclerotic plaque at the origin. There does not appear to be a critical stenosis but limited evaluation. Renals: Bilateral renal  arteries are patent with calcified plaque at the origin. There does not appear to be critical stenosis but limited evaluation. IMA: Proximal IMA appears to be patent. Inflow: Iliac arteries are calcified without significant aneurysm. Iliac arteries and proximal femoral arteries appear to be patent. Veins: IVC, renal veins and portal venous system are patent. Review of the MIP images confirms the above findings. NON-VASCULAR Lower chest: Again noted is atelectasis and/or scarring at the left lung base. Small right pleural effusion with compressive atelectasis in the right lower lobe. Coronary artery calcifications. Hepatobiliary: Normal appearance of the liver, gallbladder and portal venous system. Pancreas: Normal appearance  of the pancreas without inflammation or duct dilatation. Spleen: Normal appearance of spleen without enlargement. Adrenals/Urinary Tract: Normal adrenal glands. Probable cyst in the left kidney interpolar region without hydronephrosis. Normal appearance of the right kidney without hydronephrosis. Moderate distention of the urinary bladder without gross abnormality. Stomach/Bowel: Again noted is pneumatosis involving the transverse colon and a segment of the descending colon. There is no significant colonic wall thickening. No evidence for small bowel dilatation or obstruction. Cecum is located in the right upper abdomen and difficult to exclude some pneumatosis involving the cecum. Lymphatic: No significant lymphadenopathy in the abdomen or pelvis. Reproductive: Prostate is unremarkable. Other: No evidence for abdominal or pelvic ascites. There is no free intraperitoneal air. No evidence for portal gas. 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 There is a large lucency involving the L4 vertebral body which may be related to a Schmorl's node. There is also large low-density structure in the posterior aspect of the L3 vertebral body which could be related  to a large Schmorl's node. IMPRESSION: VASCULAR Limited evaluation of the aorta and visceral arteries due to poor contrast opacification and technical issues with this examination. No gross abnormality to the aorta such as an aneurysm or dissection. Main visceral arteries appear to be patent without significant stenosis. NON-VASCULAR Stable colonic pneumatosis. The pneumatosis is involving the transverse colon and a segment of the descending colon. No significant change from the previous examination and etiology is unknown. No focal bowel wall thickening. No free intraperitoneal air. Again noted are lucent lesions within the L3 and L4 vertebral bodies. These could be related to very large Schmorl's nodes but indeterminate. Electronically Signed: By: Markus Daft M.D. On: 02/18/2016 12:22    Medications:  Prior to Admission:  Prescriptions Prior to Admission  Medication Sig Dispense Refill Last Dose  . AMITIZA 24 MCG capsule Take 2 capsules by mouth daily.   02/17/2016 at Unknown time  . Aspirin-Salicylamide-Caffeine (BC HEADACHE POWDER PO) Take 1 Package by mouth daily as needed (pain).   02/17/2016 at Unknown time  . atorvastatin (LIPITOR) 10 MG tablet Take 10 mg by mouth daily.    02/17/2016 at Unknown time  . benzonatate (TESSALON) 200 MG capsule Take 200 mg by mouth 3 (three) times daily as needed for cough.   unknown  . furosemide (LASIX) 40 MG tablet Take 1 tablet (40 mg total) by mouth 2 (two) times daily. (Patient taking differently: Take 40 mg by mouth daily. ) 4 tablet 0 02/17/2016 at Unknown time  . HYDROcodone-acetaminophen (NORCO) 7.5-325 MG tablet Take 1 tablet by mouth every 6 (six) hours as needed for moderate pain (Must last 30 days.Do not drive or operate machinery while taking this medicine.). 180 tablet 0 unknown  . LORazepam (ATIVAN) 2 MG tablet Take 2 mg by mouth at bedtime.   02/17/2016 at Unknown time  . losartan (COZAAR) 50 MG tablet Take 50 mg by mouth daily.   02/17/2016 at  Unknown time  . metFORMIN (GLUCOPHAGE) 500 MG tablet Take 1 tablet by mouth 2 (two) times daily.   02/17/2016 at Unknown time  . mirtazapine (REMERON) 15 MG tablet Take 15 mg by mouth at bedtime.    02/17/2016 at Unknown time  . potassium chloride SA (K-DUR,KLOR-CON) 20 MEQ tablet Take 20 mEq by mouth daily.    02/17/2016 at Unknown time  . pregabalin (LYRICA) 150 MG capsule Take 150 mg by mouth 2 (two) times daily.   02/17/2016 at Unknown time  .  traZODone (DESYREL) 50 MG tablet Take 2 tablets by mouth at bedtime.    02/17/2016 at Unknown time   Scheduled: . albuterol  2.5 mg Nebulization QID  . Chlorhexidine Gluconate Cloth  6 each Topical Q0600  . heparin  5,000 Units Subcutaneous Q8H  . imipenem-cilastatin  500 mg Intravenous Q6H  . insulin aspart  0-15 Units Subcutaneous TID WC  . insulin aspart  0-5 Units Subcutaneous QHS  . insulin aspart  4 Units Subcutaneous TID WC  . insulin glargine  20 Units Subcutaneous Daily  . LORazepam  1 mg Oral BID  . losartan  50 mg Oral Daily  . methylPREDNISolone (SOLU-MEDROL) injection  40 mg Intravenous Q12H  . mirtazapine  15 mg Oral QHS  . mupirocin ointment  1 application Nasal BID  . pregabalin  150 mg Oral BID  . sodium chloride  500 mL Intravenous Once  . sodium chloride flush  3 mL Intravenous Q12H  . traZODone  100 mg Oral QHS  . vancomycin  1,000 mg Intravenous Q8H   Continuous: . sodium chloride 75 mL/hr at 02/18/16 1425  . sodium chloride 125 mL/hr at 02/20/16 0915   ZOX:[id number], alum & mag hydroxide-simeth, HYDROcodone-acetaminophen, ketorolac, ondansetron **OR** ondansetron (ZOFRAN) IV, senna-docusate  Assesment: He was admitted with cavitary pneumonia. He has had more problems now with respiratory distress. I think he's probably septic. He is not hypotensive but he is tachycardic and his lactate level was up. It appeared on chest x-ray yesterday that he had a moderate sized pleural effusion but when the ultrasound was done this  was mostly pleural thickening and no substantial fluid was seen to thoracentesis was not done. Echocardiogram showed diastolic dysfunction.  Oxygenation was adequate on blood gas earlier this morning but I don't think he can maintain his respiratory effort. Principal Problem:   Cavitary pneumonia Active Problems:   Hemoptysis   Morbid obesity ([place])   Pneumatosis intestinalis   Pressure injury of skin    Plan: Although we don't have chest x-ray or blood gas yet I think we should go ahead and intubate and place on mechanical ventilation. Discussed with his primary physician Dr.  Willey Blade who agrees. I put in orders for mechanical ventilation and pain and sedation. He's going to have a fluid bolus. I'm going to increase his IV fluids. Increase his steroids. Attempted to contact his wife Kimmie Doren at [phone] but had busy signal and then no answer. He has poor IV access so will request PICC line. Foley catheter so we can keep up with intake and output. I think his antibiotics at this point are appropriate. Increase his steroids. One hour critical care time. He is obviously critically ill and requires high complexity decision making    LOS: 2 days   Gonzalo Waymire L 02/20/2016, 9:29 AM

## 2016-02-20 NOTE — Progress Notes (Signed)
Pts blood sugar 419; called MD on call; orders to change SS coverage to resistant scale. Will continue to monitor.

## 2016-02-20 NOTE — Progress Notes (Signed)
Patient still confused, rate is 30, Saturation 96 on 80% BiPAP , morning blood gas done.

## 2016-02-21 ENCOUNTER — Inpatient Hospital Stay (HOSPITAL_COMMUNITY): Payer: Medicare Other

## 2016-02-21 LAB — GLUCOSE, CAPILLARY
GLUCOSE-CAPILLARY: 251 mg/dL — AB (ref 65–99)
GLUCOSE-CAPILLARY: 284 mg/dL — AB (ref 65–99)
Glucose-Capillary: 236 mg/dL — ABNORMAL HIGH (ref 65–99)
Glucose-Capillary: 253 mg/dL — ABNORMAL HIGH (ref 65–99)
Glucose-Capillary: 267 mg/dL — ABNORMAL HIGH (ref 65–99)
Glucose-Capillary: 282 mg/dL — ABNORMAL HIGH (ref 65–99)

## 2016-02-21 LAB — BASIC METABOLIC PANEL
Anion gap: 3 — ABNORMAL LOW (ref 5–15)
BUN: 46 mg/dL — AB (ref 6–20)
CALCIUM: 8.3 mg/dL — AB (ref 8.9–10.3)
CO2: 28 mmol/L (ref 22–32)
CREATININE: 0.76 mg/dL (ref 0.61–1.24)
Chloride: 113 mmol/L — ABNORMAL HIGH (ref 101–111)
GFR calc Af Amer: >60 mL/min (ref >60)
GLUCOSE: 263 mg/dL — AB (ref 65–99)
Potassium: 4.2 mmol/L (ref 3.5–5.1)
Sodium: 144 mmol/L (ref 135–145)

## 2016-02-21 LAB — CBC
HCT: 31.4 % — ABNORMAL LOW (ref 39.0–52.0)
Hemoglobin: 10.1 g/dL — ABNORMAL LOW (ref 13.0–17.0)
MCH: 30.3 pg (ref 26.0–34.0)
MCHC: 32.2 g/dL (ref 30.0–36.0)
MCV: 94.3 fL (ref 78.0–100.0)
PLATELETS: 256 10*3/uL (ref 150–400)
RBC: 3.33 MIL/uL — ABNORMAL LOW (ref 4.22–5.81)
RDW: 13.1 % (ref 11.5–15.5)
WBC: 20.5 10*3/uL — ABNORMAL HIGH (ref 4.0–10.5)

## 2016-02-21 LAB — BLOOD GAS, ARTERIAL
ACID-BASE EXCESS: 4.6 mmol/L — AB (ref 0.0–2.0)
Bicarbonate: 28 mmol/L (ref 20.0–28.0)
DRAWN BY: 105551
FIO2: 50
MECHVT: 600 mL
O2 Saturation: 96 %
PEEP: 5 cmH2O
PH ART: 7.408 (ref 7.350–7.450)
PO2 ART: 85.1 mmHg (ref 83.0–108.0)
RATE: 16 resp/min
pCO2 arterial: 47 mmHg (ref 32.0–48.0)

## 2016-02-21 LAB — VANCOMYCIN, TROUGH: VANCOMYCIN TR: 14 ug/mL — AB (ref 15–20)

## 2016-02-21 MED ORDER — VANCOMYCIN HCL 10 G IV SOLR
1250.0000 mg | Freq: Three times a day (TID) | INTRAVENOUS | Status: DC
  Administered 2016-02-21 – 2016-03-01 (×27): 1250 mg via INTRAVENOUS
  Filled 2016-02-21 (×29): qty 1250

## 2016-02-21 MED ORDER — SODIUM CHLORIDE 0.9 % IV SOLN: 1250.0000 mg | Freq: Three times a day (TID) | INTRAVENOUS | Status: DC

## 2016-02-21 NOTE — Progress Notes (Signed)
Subjective: Intubated and sedated.  Objective: Vital signs in last 24 hours: Vitals:   02/21/16 0434 02/21/16 0500 02/21/16 0600 02/21/16 0737  BP:  101/65 130/78   Pulse:  (!) 107 (!) 119   Resp:  16 18   Temp:    97.9 F (36.6 C)  TempSrc:    Axillary  SpO2:  97% (!) 89%   Weight:  (!) 306 lb 7 oz (139 kg)    Height: _0  (1.803 m)      Weight change: -4 lb 10.1 oz (-2.1 kg)  Intake/Output Summary (Last 24 hours) at 02/21/16 0804 Last data filed at 02/21/16 0600  Gross per 24 hour  Intake          3513.46 ml  Output             1875 ml  Net          1638.46 ml    Physical Exam: Lungs reveal bilateral rhonchi. Heart tachycardic at 1:15 on telemetry. Abdomen is soft and nondistended with no appreciable tenderness. Extremities reveal no edema. PICC line now in place. He will open his eyes and wave with his hand.  Lab Results:    Results for orders placed or performed during the hospital encounter of 02/18/16 (from the past 24 hour(s))  Lactic acid, plasma     Status: Abnormal   Collection Time: 02/20/16  9:47 AM  Result Value Ref Range   Lactic Acid, Venous 2.1 (HH) 0.5 - 1.9 mmol/L  Glucose, capillary     Status: Abnormal   Collection Time: 02/20/16 11:28 AM  Result Value Ref Range   Glucose-Capillary 404 (H) 65 - 99 mg/dL   Comment 1 Notify RN    Comment 2 Document in Chart   Draw ABG 1 hour after initiation of ventilator     Status: Abnormal   Collection Time: 02/20/16 11:40 AM  Result Value Ref Range   FIO2 50.00    Delivery systems VENTILATOR    Mode PRESSURE REGULATED VOLUME CONTROL    VT 550 mL   LHR 16 resp/min   Peep/cpap 5.0 cm H20   pH, Arterial 7.351 7.350 - 7.450   pCO2 arterial 50.2 (H) 32.0 - 48.0 mmHg   pO2, Arterial 76.3 (L) 83.0 - 108.0 mmHg   Bicarbonate 25.6 20.0 - 28.0 mmol/L   Acid-Base Excess 2.1 (H) 0.0 - 2.0 mmol/L   O2 Saturation 93.9 %   Patient temperature 37.0    Collection site LEFT RADIAL    Drawn by [id number]    Sample type  ARTERIAL    Allens test (pass/fail) PASS PASS  Glucose, capillary     Status: Abnormal   Collection Time: 02/20/16  5:05 PM  Result Value Ref Range   Glucose-Capillary 419 (H) 65 - 99 mg/dL   Comment 1 Notify RN    Comment 2 Document in Chart   Glucose, capillary     Status: Abnormal   Collection Time: 02/20/16  7:36 PM  Result Value Ref Range   Glucose-Capillary 327 (H) 65 - 99 mg/dL  Glucose, capillary     Status: Abnormal   Collection Time: 02/21/16 12:08 AM  Result Value Ref Range   Glucose-Capillary 251 (H) 65 - 99 mg/dL  Blood gas, arterial     Status: Abnormal   Collection Time: 02/21/16  4:55 AM  Result Value Ref Range   FIO2 50.00    Delivery systems VENTILATOR    Mode PRESSURE REGULATED VOLUME CONTROL  VT 600 mL   LHR 16 resp/min   Peep/cpap 5.0 cm H20   pH, Arterial 7.408 7.350 - 7.450   pCO2 arterial 47.0 32.0 - 48.0 mmHg   pO2, Arterial 85.1 83.0 - 108.0 mmHg   Bicarbonate 28.0 20.0 - 28.0 mmol/L   Acid-Base Excess 4.6 (H) 0.0 - 2.0 mmol/L   O2 Saturation 96.0 %   Collection site RADIAL    Drawn by [id number]    Sample type ARTERIAL    Allens test (pass/fail) PASS PASS  Glucose, capillary     Status: Abnormal   Collection Time: 02/21/16  5:34 AM  Result Value Ref Range   Glucose-Capillary 236 (H) 65 - 99 mg/dL  Basic metabolic panel     Status: Abnormal   Collection Time: 02/21/16  6:56 AM  Result Value Ref Range   Sodium 144 135 - 145 mmol/L   Potassium 4.2 3.5 - 5.1 mmol/L   Chloride 113 (H) 101 - 111 mmol/L   CO2 28 22 - 32 mmol/L   Glucose, Bld 263 (H) 65 - 99 mg/dL   BUN 46 (H) 6 - 20 mg/dL   Creatinine, Ser 0.76 0.61 - 1.24 mg/dL   Calcium 8.3 (L) 8.9 - 10.3 mg/dL   GFR calc non Af Amer >60 >60 mL/min   GFR calc Af Amer >60 >60 mL/min   Anion gap 3 (L) 5 - 15  Vancomycin, trough     Status: Abnormal   Collection Time: 02/21/16  6:56 AM  Result Value Ref Range   Vancomycin Tr 14 (L) 15 - 20 ug/mL  Glucose, capillary     Status: Abnormal    Collection Time: 02/21/16  7:24 AM  Result Value Ref Range   Glucose-Capillary 253 (H) 65 - 99 mg/dL   Comment 1 Notify RN    Comment 2 Document in Chart      ABGS  Recent Labs  02/21/16 0455  PHART 7.408  PO2ART 85.1  HCO3 28.0   CULTURES Recent Results (from the past 240 hour(s))  Blood culture (routine x 2)     Status: None (Preliminary result)   Collection Time: 02/18/16  7:36 AM  Result Value Ref Range Status   Specimen Description BLOOD RIGHT HAND  Final   Special Requests   Final    BOTTLES DRAWN AEROBIC AND ANAEROBIC AEB=8CC ANA=6CC   Culture NO GROWTH 2 DAYS  Final   Report Status PENDING  Incomplete  Blood culture (routine x 2)     Status: None (Preliminary result)   Collection Time: 02/18/16  7:42 AM  Result Value Ref Range Status   Specimen Description BLOOD LEFT ANTECUBITAL  Final   Special Requests BOTTLES DRAWN AEROBIC AND ANAEROBIC 6CC  Final   Culture NO GROWTH 2 DAYS  Final   Report Status PENDING  Incomplete  MRSA PCR Screening     Status: Abnormal   Collection Time: 02/19/16  4:47 PM  Result Value Ref Range Status   MRSA by PCR POSITIVE (A) NEGATIVE Final    Comment:        The GeneXpert MRSA Assay (FDA approved for NASAL specimens only), is one component of a comprehensive MRSA colonization surveillance program. It is not intended to diagnose MRSA infection nor to guide or monitor treatment for MRSA infections. CRITICAL RESULT CALLED TO, READ BACK BY AND VERIFIED WITH: PARIS,C AT 2342 ON 02/19/2016 BY WOODS,M    Studies/Results: Dg Chest 1 View  Result Date: 02/19/2016 CLINICAL DATA:  Shortness of  breath for several weeks EXAM: CHEST 1 VIEW COMPARISON:  02/18/2016 FINDINGS: Cardiac shadow is enlarged. Right-sided pleural effusion is increasing from the prior exam. No focal infiltrate is seen in the left lung. No bony abnormality is noted. IMPRESSION: Increasing right-sided pleural effusion. Electronically Signed   By: Inez Catalina M.D.    On: 02/19/2016 09:28   [place] Chest  Result Date: 02/19/2016 CLINICAL DATA:  Abnormal chest radiograph, RIGHT pleural effusion EXAM: CHEST ULTRASOUND COMPARISON:  Chest radiograph 02/19/2016 FINDINGS: Only a minimal amount of RIGHT pleural fluid is identified. Consolidated lower RIGHT lung is seen. Volume of fluid visualized is insufficient for thoracentesis. IMPRESSION: RIGHT lower lobe consolidation. Minimal RIGHT pleural effusion. Discussed with Dr. Luan Pulling. Electronically Signed   By: Lavonia Dana M.D.   On: 02/19/2016 14:07   Dg Chest Port 1 View  Result Date: 02/20/2016 CLINICAL DATA:  Respiratory failure.  Evaluate endotracheal tube. EXAM: PORTABLE CHEST 1 VIEW COMPARISON:  02/19/2016. FINDINGS: Endotracheal tube is 5.6 cm above the carina and appropriately positioned. A nasogastric tube extends into the abdomen. Lower chest is not completely visualized. Patient rotated towards the right. Left lung appears clear. There continues to be right pleural fluid which is probably loculated. Persistent densities at the right chest base could represent atelectasis. Heart size is grossly stable. IMPRESSION: Endotracheal tube is appropriately positioned above the carina. Persistent pleural-parenchymal disease in the right lung with volume loss. Electronically Signed   By: Markus Daft M.D.   On: 02/20/2016 11:11   Dg Chest Port 1v Same Day  Result Date: 02/20/2016 CLINICAL DATA:  Status post PICC line placement. EXAM: PORTABLE CHEST 1 VIEW COMPARISON:  02/20/2016 at 1050 hours FINDINGS: Endotracheal tube is 5 cm above the carina. Nasogastric tube extends to the abdomen but the tip is beyond the image. Again noted is right pleural fluid extending up the lateral aspect of the right chest. There continues to be consolidation and volume loss in the right lower lung. Stable enlargement of the cardiac silhouette. A left arm PICC line has been placed. Catheter tip has a horizontal orientation and probably at the  junction of the upper SVC and left innominate vein. IMPRESSION: PICC line tip near the junction of the left innominate vein and SVC. Persistent pleural and parenchymal disease in the right chest. Minimal change from the recent comparison examination. Electronically Signed   By: Markus Daft M.D.   On: 02/20/2016 12:55   Micro Results: Recent Results (from the past 240 hour(s))  Blood culture (routine x 2)     Status: None (Preliminary result)   Collection Time: 02/18/16  7:36 AM  Result Value Ref Range Status   Specimen Description BLOOD RIGHT HAND  Final   Special Requests   Final    BOTTLES DRAWN AEROBIC AND ANAEROBIC AEB=8CC ANA=6CC   Culture NO GROWTH 2 DAYS  Final   Report Status PENDING  Incomplete  Blood culture (routine x 2)     Status: None (Preliminary result)   Collection Time: 02/18/16  7:42 AM  Result Value Ref Range Status   Specimen Description BLOOD LEFT ANTECUBITAL  Final   Special Requests BOTTLES DRAWN AEROBIC AND ANAEROBIC 6CC  Final   Culture NO GROWTH 2 DAYS  Final   Report Status PENDING  Incomplete  MRSA PCR Screening     Status: Abnormal   Collection Time: 02/19/16  4:47 PM  Result Value Ref Range Status   MRSA by PCR POSITIVE (A) NEGATIVE Final    Comment:  The GeneXpert MRSA Assay (FDA approved for NASAL specimens only), is one component of a comprehensive MRSA colonization surveillance program. It is not intended to diagnose MRSA infection nor to guide or monitor treatment for MRSA infections. CRITICAL RESULT CALLED TO, READ BACK BY AND VERIFIED WITH: [place],C AT 2342 ON 02/19/2016 BY WOODS,M    Studies/Results: Dg Chest 1 View  Result Date: 02/19/2016 CLINICAL DATA:  Shortness of breath for several weeks EXAM: CHEST 1 VIEW COMPARISON:  02/18/2016 FINDINGS: Cardiac shadow is enlarged. Right-sided pleural effusion is increasing from the prior exam. No focal infiltrate is seen in the left lung. No bony abnormality is noted. IMPRESSION: Increasing  right-sided pleural effusion. Electronically Signed   By: Inez Catalina M.D.   On: 02/19/2016 09:28   [place] Chest  Result Date: 02/19/2016 CLINICAL DATA:  Abnormal chest radiograph, RIGHT pleural effusion EXAM: CHEST ULTRASOUND COMPARISON:  Chest radiograph 02/19/2016 FINDINGS: Only a minimal amount of RIGHT pleural fluid is identified. Consolidated lower RIGHT lung is seen. Volume of fluid visualized is insufficient for thoracentesis. IMPRESSION: RIGHT lower lobe consolidation. Minimal RIGHT pleural effusion. Discussed with Dr. Luan Pulling. Electronically Signed   By: Lavonia Dana M.D.   On: 02/19/2016 14:07   Dg Chest Port 1 View  Result Date: 02/20/2016 CLINICAL DATA:  Respiratory failure.  Evaluate endotracheal tube. EXAM: PORTABLE CHEST 1 VIEW COMPARISON:  02/19/2016. FINDINGS: Endotracheal tube is 5.6 cm above the carina and appropriately positioned. A nasogastric tube extends into the abdomen. Lower chest is not completely visualized. Patient rotated towards the right. Left lung appears clear. There continues to be right pleural fluid which is probably loculated. Persistent densities at the right chest base could represent atelectasis. Heart size is grossly stable. IMPRESSION: Endotracheal tube is appropriately positioned above the carina. Persistent pleural-parenchymal disease in the right lung with volume loss. Electronically Signed   By: Markus Daft M.D.   On: 02/20/2016 11:11   Dg Chest Port 1v Same Day  Result Date: 02/20/2016 CLINICAL DATA:  Status post PICC line placement. EXAM: PORTABLE CHEST 1 VIEW COMPARISON:  02/20/2016 at 1050 hours FINDINGS: Endotracheal tube is 5 cm above the carina. Nasogastric tube extends to the abdomen but the tip is beyond the image. Again noted is right pleural fluid extending up the lateral aspect of the right chest. There continues to be consolidation and volume loss in the right lower lung. Stable enlargement of the cardiac silhouette. A left arm PICC line has been  placed. Catheter tip has a horizontal orientation and probably at the junction of the upper SVC and left innominate vein. IMPRESSION: PICC line tip near the junction of the left innominate vein and SVC. Persistent pleural and parenchymal disease in the right chest. Minimal change from the recent comparison examination. Electronically Signed   By: Markus Daft M.D.   On: 02/20/2016 12:55   Medications:  I have reviewed the patient's current medications Scheduled Meds: . albuterol  2.5 mg Nebulization QID  . chlorhexidine gluconate (MEDLINE KIT)  15 mL Mouth Rinse BID  . Chlorhexidine Gluconate Cloth  6 each Topical Q0600  . famotidine (PEPCID) IV  20 mg Intravenous Q12H  . heparin  5,000 Units Subcutaneous Q8H  . imipenem-cilastatin  500 mg Intravenous Q6H  . insulin aspart  0-20 Units Subcutaneous Q4H  . insulin glargine  20 Units Subcutaneous Daily  . LORazepam  1 mg Oral BID  . losartan  50 mg Oral Daily  . mouth rinse  15 mL Mouth Rinse QID  .  methylPREDNISolone (SOLU-MEDROL) injection  40 mg Intravenous Q12H  . mirtazapine  15 mg Oral QHS  . mupirocin ointment  1 application Nasal BID  . pregabalin  150 mg Oral BID  . sodium chloride flush  3 mL Intravenous Q12H  . traZODone  100 mg Oral QHS  . vancomycin  1,000 mg Intravenous Q8H   Continuous Infusions: . sodium chloride 75 mL/hr at 02/18/16 1425  . sodium chloride 125 mL/hr at 02/20/16 1805  . fentaNYL infusion INTRAVENOUS 25 mcg/hr (02/21/16 0600)   PRN Meds:.albuterol, alum & mag hydroxide-simeth, fentaNYL (SUBLIMAZE) injection, fentaNYL (SUBLIMAZE) injection, HYDROcodone-acetaminophen, ketorolac, midazolam, midazolam, ondansetron **OR** ondansetron (ZOFRAN) IV, senna-docusate   Assessment/Plan: #1. Pneumonia. Acute respiratory failure. Intubated yesterday with good placement of ET tube by chest x-ray. Today's chest x-ray pending. CBC pending. Arterial blood gases on FiO2 of 50% reveal a pH of 7.40 with a PCO2 of 47 and PO2 of  85. Blood cultures are negative so far. #2. Azotemia. BUN and creatinine are 46 and 0.76 today. #3. Diabetes. Lantus started yesterday. Glucose 263. Continue NovoLog and Lantus. #4. Colonic pneumatosis. Abdominal exam remains benign.  Dr. Luan Pulling will assume care of Mr. Camera in my absence. Principal Problem:   Cavitary pneumonia Active Problems:   Hemoptysis   Morbid obesity ([place])   Pneumatosis intestinalis   Pressure injury of skin     LOS: 3 days   Richard Fields 02/21/2016, 8:04 AM

## 2016-02-21 NOTE — Progress Notes (Signed)
Subjective: He remains intubated and sedated. He looks comfortable. His respiratory rate is decreased now. His sedation is adequate but he is able to respond.  Objective: Vital signs in last 24 hours: Temp:  [97.9 F (36.6 C)-100 F (37.8 C)] 97.9 F (36.6 C) (10/22 0737) Pulse Rate:  [80-122] 110 (10/22 0815) Resp:  [0-34] 16 (10/22 0815) BP: (78-194)/(48-118) 108/65 (10/22 0815) SpO2:  [89 %-99 %] 96 % (10/22 0845) FiO2 (%):  [50 %] 50 % (10/22 0845) Weight:  [139 kg (306 lb 7 oz)] 139 kg (306 lb 7 oz) (10/22 0500) Weight change: -2.1 kg (-4 lb 10.1 oz) Last BM Date: 02/17/16  Intake/Output from previous day: 10/21 0701 - 10/22 0700 In: 3513.5 [I.V.:2563.5; IV Piggyback:950] Out: 1875 [Urine:1875]  PHYSICAL EXAM General appearance: Intubated and sedated on mechanical ventilation Resp: rhonchi bilaterally Cardio: regular rate and rhythm, S1, S2 normal, no murmur, click, rub or gallop GI: Soft obese without masses. Bowel sounds are present and active Extremities: Chronic venous stasis changes. Feet are warm Mucous membranes are moist. Skin warm and dry  Lab Results:  Results for orders placed or performed during the hospital encounter of 02/18/16 (from the past 48 hour(s))  Glucose, capillary     Status: Abnormal   Collection Time: 02/19/16 11:54 AM  Result Value Ref Range   Glucose-Capillary 311 (H) 65 - 99 mg/dL   Comment 1 Notify RN    Comment 2 Document in Chart   Glucose, capillary     Status: Abnormal   Collection Time: 02/19/16  4:00 PM  Result Value Ref Range   Glucose-Capillary 328 (H) 65 - 99 mg/dL   Comment 1 Notify RN    Comment 2 Document in Chart   MRSA PCR Screening     Status: Abnormal   Collection Time: 02/19/16  4:47 PM  Result Value Ref Range   MRSA by PCR POSITIVE (A) NEGATIVE    Comment:        The GeneXpert MRSA Assay (FDA approved for NASAL specimens only), is one component of a comprehensive MRSA colonization surveillance program. It is  not intended to diagnose MRSA infection nor to guide or monitor treatment for MRSA infections. CRITICAL RESULT CALLED TO, READ BACK BY AND VERIFIED WITH: PARIS,C AT 2342 ON 02/19/2016 BY WOODS,M   Glucose, capillary     Status: Abnormal   Collection Time: 02/19/16  6:29 PM  Result Value Ref Range   Glucose-Capillary 317 (H) 65 - 99 mg/dL  Glucose, capillary     Status: Abnormal   Collection Time: 02/19/16  9:49 PM  Result Value Ref Range   Glucose-Capillary 326 (H) 65 - 99 mg/dL   Comment 1 Notify RN    Comment 2 Document in Chart   Blood gas, arterial     Status: Abnormal   Collection Time: 02/20/16  4:40 AM  Result Value Ref Range   FIO2 80.00    Delivery systems BILEVEL POSITIVE AIRWAY PRESSURE    Mode OXYGEN MASK    LHR 8 resp/min   Inspiratory PAP 15    Expiratory PAP 6.0    pH, Arterial 7.404 7.350 - 7.450   pCO2 arterial 46.9 32.0 - 48.0 mmHg   pO2, Arterial 87.8 83.0 - 108.0 mmHg   Bicarbonate 27.8 20.0 - 28.0 mmol/L   Acid-Base Excess 4.3 (H) 0.0 - 2.0 mmol/L   O2 Saturation 96.4 %   Collection site RADIAL    Drawn by [id number]    Sample type ARTERIAL  Allens test (pass/fail) PASS PASS  CBC     Status: Abnormal   Collection Time: 02/20/16  5:09 AM  Result Value Ref Range   WBC 24.4 (H) 4.0 - 10.5 K/uL   RBC 4.39 4.22 - 5.81 MIL/uL   Hemoglobin 13.4 13.0 - 17.0 g/dL   HCT 41.5 39.0 - 52.0 %   MCV 94.5 78.0 - 100.0 fL   MCH 30.5 26.0 - 34.0 pg   MCHC 32.3 30.0 - 36.0 g/dL   RDW 12.9 11.5 - 15.5 %   Platelets 265 150 - 400 K/uL  Basic metabolic panel     Status: Abnormal   Collection Time: 02/20/16  6:32 AM  Result Value Ref Range   Sodium 139 135 - 145 mmol/L   Potassium 4.8 3.5 - 5.1 mmol/L   Chloride 106 101 - 111 mmol/L   CO2 27 22 - 32 mmol/L   Glucose, Bld 366 (H) 65 - 99 mg/dL   BUN 35 (H) 6 - 20 mg/dL   Creatinine, Ser 0.66 0.61 - 1.24 mg/dL   Calcium 8.6 (L) 8.9 - 10.3 mg/dL   GFR calc non Af Amer >60 >60 mL/min   GFR calc Af Amer >60 >60  mL/min    Comment: (NOTE) The eGFR has been calculated using the CKD EPI equation. This calculation has not been validated in all clinical situations. eGFR's persistently <60 mL/min signify possible Chronic Kidney Disease.    Anion gap 6 5 - 15  Lactic acid, plasma     Status: Abnormal   Collection Time: 02/20/16  9:47 AM  Result Value Ref Range   Lactic Acid, Venous 2.1 (HH) 0.5 - 1.9 mmol/L    Comment: CRITICAL RESULT CALLED TO, READ BACK BY AND VERIFIED WITH: MCDANIEL,M AT 1104 ON 02/20/16 BY MOSLEY,J   Glucose, capillary     Status: Abnormal   Collection Time: 02/20/16 11:28 AM  Result Value Ref Range   Glucose-Capillary 404 (H) 65 - 99 mg/dL   Comment 1 Notify RN    Comment 2 Document in Chart   Draw ABG 1 hour after initiation of ventilator     Status: Abnormal   Collection Time: 02/20/16 11:40 AM  Result Value Ref Range   FIO2 50.00    Delivery systems VENTILATOR    Mode PRESSURE REGULATED VOLUME CONTROL    VT 550 mL   LHR 16 resp/min   Peep/cpap 5.0 cm H20   pH, Arterial 7.351 7.350 - 7.450   pCO2 arterial 50.2 (H) 32.0 - 48.0 mmHg   pO2, Arterial 76.3 (L) 83.0 - 108.0 mmHg   Bicarbonate 25.6 20.0 - 28.0 mmol/L   Acid-Base Excess 2.1 (H) 0.0 - 2.0 mmol/L   O2 Saturation 93.9 %   Patient temperature 37.0    Collection site LEFT RADIAL    Drawn by [id number]    Sample type ARTERIAL    Allens test (pass/fail) PASS PASS  Glucose, capillary     Status: Abnormal   Collection Time: 02/20/16  5:05 PM  Result Value Ref Range   Glucose-Capillary 419 (H) 65 - 99 mg/dL   Comment 1 Notify RN    Comment 2 Document in Chart   Glucose, capillary     Status: Abnormal   Collection Time: 02/20/16  7:36 PM  Result Value Ref Range   Glucose-Capillary 327 (H) 65 - 99 mg/dL  Glucose, capillary     Status: Abnormal   Collection Time: 02/21/16 12:08 AM  Result Value Ref Range  Glucose-Capillary 251 (H) 65 - 99 mg/dL  Blood gas, arterial     Status: Abnormal   Collection Time:  02/21/16  4:55 AM  Result Value Ref Range   FIO2 50.00    Delivery systems VENTILATOR    Mode PRESSURE REGULATED VOLUME CONTROL    VT 600 mL   LHR 16 resp/min   Peep/cpap 5.0 cm H20   pH, Arterial 7.408 7.350 - 7.450   pCO2 arterial 47.0 32.0 - 48.0 mmHg   pO2, Arterial 85.1 83.0 - 108.0 mmHg   Bicarbonate 28.0 20.0 - 28.0 mmol/L   Acid-Base Excess 4.6 (H) 0.0 - 2.0 mmol/L   O2 Saturation 96.0 %   Collection site RADIAL    Drawn by [id number]    Sample type ARTERIAL    Allens test (pass/fail) PASS PASS  Glucose, capillary     Status: Abnormal   Collection Time: 02/21/16  5:34 AM  Result Value Ref Range   Glucose-Capillary 236 (H) 65 - 99 mg/dL  CBC     Status: Abnormal   Collection Time: 02/21/16  6:56 AM  Result Value Ref Range   WBC 20.5 (H) 4.0 - 10.5 K/uL   RBC 3.33 (L) 4.22 - 5.81 MIL/uL   Hemoglobin 10.1 (L) 13.0 - 17.0 g/dL    Comment: DELTA CHECK NOTED RESULT REPEATED AND VERIFIED    HCT 31.4 (L) 39.0 - 52.0 %   MCV 94.3 78.0 - 100.0 fL   MCH 30.3 26.0 - 34.0 pg   MCHC 32.2 30.0 - 36.0 g/dL   RDW 13.1 11.5 - 15.5 %   Platelets 256 150 - 400 K/uL  Basic metabolic panel     Status: Abnormal   Collection Time: 02/21/16  6:56 AM  Result Value Ref Range   Sodium 144 135 - 145 mmol/L   Potassium 4.2 3.5 - 5.1 mmol/L   Chloride 113 (H) 101 - 111 mmol/L   CO2 28 22 - 32 mmol/L   Glucose, Bld 263 (H) 65 - 99 mg/dL   BUN 46 (H) 6 - 20 mg/dL   Creatinine, Ser 0.76 0.61 - 1.24 mg/dL   Calcium 8.3 (L) 8.9 - 10.3 mg/dL   GFR calc non Af Amer >60 >60 mL/min   GFR calc Af Amer >60 >60 mL/min    Comment: (NOTE) The eGFR has been calculated using the CKD EPI equation. This calculation has not been validated in all clinical situations. eGFR's persistently <60 mL/min signify possible Chronic Kidney Disease.    Anion gap 3 (L) 5 - 15  Vancomycin, trough     Status: Abnormal   Collection Time: 02/21/16  6:56 AM  Result Value Ref Range   Vancomycin Tr 14 (L) 15 - 20 ug/mL   Glucose, capillary     Status: Abnormal   Collection Time: 02/21/16  7:24 AM  Result Value Ref Range   Glucose-Capillary 253 (H) 65 - 99 mg/dL   Comment 1 Notify RN    Comment 2 Document in Chart     ABGS  Recent Labs  02/21/16 0455  PHART 7.408  PO2ART 85.1  HCO3 28.0   CULTURES Recent Results (from the past 240 hour(s))  Blood culture (routine x 2)     Status: None (Preliminary result)   Collection Time: 02/18/16  7:36 AM  Result Value Ref Range Status   Specimen Description BLOOD RIGHT HAND  Final   Special Requests   Final    BOTTLES DRAWN AEROBIC AND ANAEROBIC AEB=8CC ANA=6CC  Culture NO GROWTH 2 DAYS  Final   Report Status PENDING  Incomplete  Blood culture (routine x 2)     Status: None (Preliminary result)   Collection Time: 02/18/16  7:42 AM  Result Value Ref Range Status   Specimen Description BLOOD LEFT ANTECUBITAL  Final   Special Requests BOTTLES DRAWN AEROBIC AND ANAEROBIC 6CC  Final   Culture NO GROWTH 2 DAYS  Final   Report Status PENDING  Incomplete  MRSA PCR Screening     Status: Abnormal   Collection Time: 02/19/16  4:47 PM  Result Value Ref Range Status   MRSA by PCR POSITIVE (A) NEGATIVE Final    Comment:        The GeneXpert MRSA Assay (FDA approved for NASAL specimens only), is one component of a comprehensive MRSA colonization surveillance program. It is not intended to diagnose MRSA infection nor to guide or monitor treatment for MRSA infections. CRITICAL RESULT CALLED TO, READ BACK BY AND VERIFIED WITH: PARIS,C AT 2342 ON 02/19/2016 BY WOODS,M    Studies/Results: Dg Chest 1 View  Result Date: 02/19/2016 CLINICAL DATA:  Shortness of breath for several weeks EXAM: CHEST 1 VIEW COMPARISON:  02/18/2016 FINDINGS: Cardiac shadow is enlarged. Right-sided pleural effusion is increasing from the prior exam. No focal infiltrate is seen in the left lung. No bony abnormality is noted. IMPRESSION: Increasing right-sided pleural effusion.  Electronically Signed   By: Inez Catalina M.D.   On: 02/19/2016 09:28   [place] Chest  Result Date: 02/19/2016 CLINICAL DATA:  Abnormal chest radiograph, RIGHT pleural effusion EXAM: CHEST ULTRASOUND COMPARISON:  Chest radiograph 02/19/2016 FINDINGS: Only a minimal amount of RIGHT pleural fluid is identified. Consolidated lower RIGHT lung is seen. Volume of fluid visualized is insufficient for thoracentesis. IMPRESSION: RIGHT lower lobe consolidation. Minimal RIGHT pleural effusion. Discussed with Dr. Luan Pulling. Electronically Signed   By: Lavonia Dana M.D.   On: 02/19/2016 14:07   Dg Chest Port 1 View  Result Date: 02/20/2016 CLINICAL DATA:  Respiratory failure.  Evaluate endotracheal tube. EXAM: PORTABLE CHEST 1 VIEW COMPARISON:  02/19/2016. FINDINGS: Endotracheal tube is 5.6 cm above the carina and appropriately positioned. A nasogastric tube extends into the abdomen. Lower chest is not completely visualized. Patient rotated towards the right. Left lung appears clear. There continues to be right pleural fluid which is probably loculated. Persistent densities at the right chest base could represent atelectasis. Heart size is grossly stable. IMPRESSION: Endotracheal tube is appropriately positioned above the carina. Persistent pleural-parenchymal disease in the right lung with volume loss. Electronically Signed   By: Markus Daft M.D.   On: 02/20/2016 11:11   Dg Chest Port 1v Same Day  Result Date: 02/20/2016 CLINICAL DATA:  Status post PICC line placement. EXAM: PORTABLE CHEST 1 VIEW COMPARISON:  02/20/2016 at 1050 hours FINDINGS: Endotracheal tube is 5 cm above the carina. Nasogastric tube extends to the abdomen but the tip is beyond the image. Again noted is right pleural fluid extending up the lateral aspect of the right chest. There continues to be consolidation and volume loss in the right lower lung. Stable enlargement of the cardiac silhouette. A left arm PICC line has been placed. Catheter tip has a  horizontal orientation and probably at the junction of the upper SVC and left innominate vein. IMPRESSION: PICC line tip near the junction of the left innominate vein and SVC. Persistent pleural and parenchymal disease in the right chest. Minimal change from the recent comparison examination. Electronically Signed  By: Markus Daft M.D.   On: 02/20/2016 12:55    Medications:  Prior to Admission:  Prescriptions Prior to Admission  Medication Sig Dispense Refill Last Dose  . AMITIZA 24 MCG capsule Take 2 capsules by mouth daily.   02/17/2016 at Unknown time  . Aspirin-Salicylamide-Caffeine (BC HEADACHE POWDER PO) Take 1 Package by mouth daily as needed (pain).   02/17/2016 at Unknown time  . atorvastatin (LIPITOR) 10 MG tablet Take 10 mg by mouth daily.    02/17/2016 at Unknown time  . benzonatate (TESSALON) 200 MG capsule Take 200 mg by mouth 3 (three) times daily as needed for cough.   unknown  . furosemide (LASIX) 40 MG tablet Take 1 tablet (40 mg total) by mouth 2 (two) times daily. (Patient taking differently: Take 40 mg by mouth daily. ) 4 tablet 0 02/17/2016 at Unknown time  . HYDROcodone-acetaminophen (NORCO) 7.5-325 MG tablet Take 1 tablet by mouth every 6 (six) hours as needed for moderate pain (Must last 30 days.Do not drive or operate machinery while taking this medicine.). 180 tablet 0 unknown  . LORazepam (ATIVAN) 2 MG tablet Take 2 mg by mouth at bedtime.   02/17/2016 at Unknown time  . losartan (COZAAR) 50 MG tablet Take 50 mg by mouth daily.   02/17/2016 at Unknown time  . metFORMIN (GLUCOPHAGE) 500 MG tablet Take 1 tablet by mouth 2 (two) times daily.   02/17/2016 at Unknown time  . mirtazapine (REMERON) 15 MG tablet Take 15 mg by mouth at bedtime.    02/17/2016 at Unknown time  . potassium chloride SA (K-DUR,KLOR-CON) 20 MEQ tablet Take 20 mEq by mouth daily.    02/17/2016 at Unknown time  . pregabalin (LYRICA) 150 MG capsule Take 150 mg by mouth 2 (two) times daily.   02/17/2016  at Unknown time  . traZODone (DESYREL) 50 MG tablet Take 2 tablets by mouth at bedtime.    02/17/2016 at Unknown time   Scheduled: . albuterol  2.5 mg Nebulization QID  . chlorhexidine gluconate (MEDLINE KIT)  15 mL Mouth Rinse BID  . Chlorhexidine Gluconate Cloth  6 each Topical Q0600  . famotidine (PEPCID) IV  20 mg Intravenous Q12H  . heparin  5,000 Units Subcutaneous Q8H  . imipenem-cilastatin  500 mg Intravenous Q6H  . insulin aspart  0-20 Units Subcutaneous Q4H  . insulin glargine  20 Units Subcutaneous Daily  . LORazepam  1 mg Oral BID  . losartan  50 mg Oral Daily  . mouth rinse  15 mL Mouth Rinse QID  . methylPREDNISolone (SOLU-MEDROL) injection  40 mg Intravenous Q12H  . mirtazapine  15 mg Oral QHS  . mupirocin ointment  1 application Nasal BID  . pregabalin  150 mg Oral BID  . sodium chloride flush  3 mL Intravenous Q12H  . traZODone  100 mg Oral QHS  . vancomycin  1,250 mg Intravenous Q8H   Continuous: . sodium chloride 75 mL/hr at 02/18/16 1425  . sodium chloride 125 mL/hr at 02/21/16 0800  . fentaNYL infusion INTRAVENOUS 25 mcg/hr (02/21/16 0800)   VOZ:[id number], alum & mag hydroxide-simeth, fentaNYL (SUBLIMAZE) injection, fentaNYL (SUBLIMAZE) injection, HYDROcodone-acetaminophen, ketorolac, midazolam, midazolam, ondansetron **OR** ondansetron (ZOFRAN) IV, senna-docusate  Assesment: He was admitted with cavitary pneumonia. He has developed respiratory distress resulting in him being intubated and placed on mechanical ventilator yesterday. He looks much more comfortable. I think he was septic although blood cultures are negative so far.  He has diabetes and his blood sugar is  better.  I think he is still somewhat dehydrated with elevated BUN.  He has morbid obesity  Colonic pneumatosis was noted on admission but his abdominal examination is unremarkable  There was concern that he had pleural effusion but ultrasound did not confirm that except for a very small  amount  Principal Problem:   Cavitary pneumonia Active Problems:   Hemoptysis   Morbid obesity ([place])   Pneumatosis intestinalis   Pressure injury of skin    Plan:Continue treatments. Recheck labs tomorrow. No attempted weaning today   LOS: 3 days   Richard Fields L 02/21/2016, 9:10 AM

## 2016-02-21 NOTE — Progress Notes (Addendum)
Pharmacy Antibiotic Note  Richard Fields is a 75 y.o. male who is morbidly obese, admitted on 02/18/2016 with pneumonia.  Pharmacy has been consulted for VANCOMYCIN and PRIMAXIN dosing.  Pt is morbidly obese, will use NORMALIZED CLCR. Vancomycin trough reported as 80mcg/ml. Will increase dose slightly for troughs around 46mcg/ml. Afebrile this AM. WBC improving  Plan: Increase Vancomycin 1250mg  IV q8h (goal trough 15-20) Check trough at SS Primaxin 500mg  IV q6hrs Monitor labs, progress, c/s  Height: 5\' 11"  (180.3 cm) Weight: (!) 306 lb 7 oz (139 kg) IBW/kg (Calculated) : 75.3  Temp (24hrs), Avg:98.9 F (37.2 C), Min:97.9 F (36.6 C), Max:100 F (37.8 C)   Recent Labs Lab 02/18/16 0517 02/18/16 0736 02/18/16 1047 02/19/16 0205 02/20/16 0509 02/20/16 [id number] 02/20/16 0947 02/21/16 0656  WBC 19.9*  --   --  29.1* 24.4*  --   --  20.5*  CREATININE 0.88  --   --  0.71  --  0.66  --  0.76  LATICACIDVEN  --  1.9 3.5*  --   --   --  2.1*  --   VANCOTROUGH  --   --   --   --   --   --   --  14*    Estimated Creatinine Clearance: 110.3 mL/min (by C-G formula based on SCr of 0.76 mg/dL).    Allergies  Allergen Reactions  . Penicillins Other (See Comments)    Unknown-reaction is unknown  . Tegretol [Carbamazepine] Itching   Antimicrobials this admission: Vancomycin 10/19 >>   Dose adjustments this admission: 10/21 Vancomycin increased to 1250mg  IV q8h  Microbiology results:  02/18/16 BCx: ngtd  10/20: MRSA PCR is positive Thank you for allowing pharmacy to be a part of this patient's care.  Isac Sarna, BS Pharm D, [place] Clinical Pharmacist Pager [phone] 02/21/2016 8:51 AM

## 2016-02-22 ENCOUNTER — Inpatient Hospital Stay (HOSPITAL_COMMUNITY): Payer: Medicare Other

## 2016-02-22 LAB — CBC WITH DIFFERENTIAL/PLATELET
BASOS ABS: 0 10*3/uL (ref 0.0–0.1)
BASOS PCT: 0 %
EOS PCT: 0 %
Eosinophils Absolute: 0 10*3/uL (ref 0.0–0.7)
HCT: 29.1 % — ABNORMAL LOW (ref 39.0–52.0)
Hemoglobin: 9.3 g/dL — ABNORMAL LOW (ref 13.0–17.0)
Lymphocytes Relative: 4 %
Lymphs Abs: 0.5 10*3/uL — ABNORMAL LOW (ref 0.7–4.0)
MCH: 30.4 pg (ref 26.0–34.0)
MCHC: 32 g/dL (ref 30.0–36.0)
MCV: 95.1 fL (ref 78.0–100.0)
MONO ABS: 1 10*3/uL (ref 0.1–1.0)
Monocytes Relative: 7 %
Neutro Abs: 11.8 10*3/uL — ABNORMAL HIGH (ref 1.7–7.7)
Neutrophils Relative %: 89 %
PLATELETS: 248 10*3/uL (ref 150–400)
RBC: 3.06 MIL/uL — AB (ref 4.22–5.81)
RDW: 13.2 % (ref 11.5–15.5)
WBC: 13.3 10*3/uL — AB (ref 4.0–10.5)

## 2016-02-22 LAB — BLOOD GAS, ARTERIAL
ACID-BASE EXCESS: 3.9 mmol/L — AB (ref 0.0–2.0)
ACID-BASE EXCESS: 4.3 mmol/L — AB (ref 0.0–2.0)
BICARBONATE: 27.4 mmol/L (ref 20.0–28.0)
Bicarbonate: 27.8 mmol/L (ref 20.0–28.0)
DRAWN BY: 234301
Drawn by: 105551
FIO2: 40
FIO2: 40
LHR: 16 {breaths}/min
MODE: POSITIVE
O2 SAT: 93.5 %
O2 Saturation: 95.1 %
PCO2 ART: 48.6 mmHg — AB (ref 32.0–48.0)
PEEP/CPAP: 5 cmH2O
PEEP: 5 cmH2O
PH ART: 7.406 (ref 7.350–7.450)
PO2 ART: 71.5 mmHg — AB (ref 83.0–108.0)
Pressure support: 5 cmH2O
VT: 600 mL
pCO2 arterial: 46.1 mmHg (ref 32.0–48.0)
pH, Arterial: 7.392 (ref 7.350–7.450)
pO2, Arterial: 79.3 mmHg — ABNORMAL LOW (ref 83.0–108.0)

## 2016-02-22 LAB — GLUCOSE, CAPILLARY
GLUCOSE-CAPILLARY: 191 mg/dL — AB (ref 65–99)
GLUCOSE-CAPILLARY: 244 mg/dL — AB (ref 65–99)
GLUCOSE-CAPILLARY: 265 mg/dL — AB (ref 65–99)
GLUCOSE-CAPILLARY: 298 mg/dL — AB (ref 65–99)
GLUCOSE-CAPILLARY: 342 mg/dL — AB (ref 65–99)
Glucose-Capillary: 126 mg/dL — ABNORMAL HIGH (ref 65–99)
Glucose-Capillary: 273 mg/dL — ABNORMAL HIGH (ref 65–99)
Glucose-Capillary: 304 mg/dL — ABNORMAL HIGH (ref 65–99)
Glucose-Capillary: 306 mg/dL — ABNORMAL HIGH (ref 65–99)

## 2016-02-22 LAB — COMPREHENSIVE METABOLIC PANEL
ALK PHOS: 67 U/L (ref 38–126)
ALT: 11 U/L — AB (ref 17–63)
AST: 18 U/L (ref 15–41)
Albumin: 1.9 g/dL — ABNORMAL LOW (ref 3.5–5.0)
BILIRUBIN TOTAL: 0.5 mg/dL (ref 0.3–1.2)
BUN: 31 mg/dL — AB (ref 6–20)
CALCIUM: 8 mg/dL — AB (ref 8.9–10.3)
CO2: 29 mmol/L (ref 22–32)
Chloride: 115 mmol/L — ABNORMAL HIGH (ref 101–111)
Creatinine, Ser: 0.71 mg/dL (ref 0.61–1.24)
GFR calc non Af Amer: >60 mL/min (ref >60)
GLUCOSE: 297 mg/dL — AB (ref 65–99)
Potassium: 4 mmol/L (ref 3.5–5.1)
SODIUM: 146 mmol/L — AB (ref 135–145)
TOTAL PROTEIN: 5.5 g/dL — AB (ref 6.5–8.1)

## 2016-02-22 LAB — MAGNESIUM
MAGNESIUM: 2.3 mg/dL (ref 1.7–2.4)
Magnesium: 2.3 mg/dL (ref 1.7–2.4)

## 2016-02-22 LAB — PHOSPHORUS
PHOSPHORUS: 2 mg/dL — AB (ref 2.5–4.6)
Phosphorus: 2.5 mg/dL (ref 2.5–4.6)

## 2016-02-22 MED ORDER — PRO-STAT SUGAR FREE PO LIQD
30.0000 mL | Freq: Three times a day (TID) | ORAL | Status: DC
  Administered 2016-02-23: 30 mL
  Filled 2016-02-22: qty 30

## 2016-02-22 MED ORDER — FENTANYL 2500MCG IN NS 250ML (10MCG/ML) PREMIX INFUSION
INTRAVENOUS | Status: AC
  Filled 2016-02-22: qty 250

## 2016-02-22 MED ORDER — VITAL HIGH PROTEIN PO LIQD
1000.0000 mL | ORAL | Status: DC
  Administered 2016-02-22 – 2016-02-23 (×2): 1000 mL
  Filled 2016-02-22 (×3): qty 1000

## 2016-02-22 MED ORDER — PRO-STAT SUGAR FREE PO LIQD
30.0000 mL | Freq: Two times a day (BID) | ORAL | Status: DC
  Administered 2016-02-22: 30 mL
  Filled 2016-02-22: qty 30

## 2016-02-22 MED FILL — Medication: Qty: 1 | Status: AC

## 2016-02-22 NOTE — Progress Notes (Signed)
SLP Cancellation Note  Patient Details Name: NIKOLAUS MALECKI MRN: [id number] DOB: 04-28-1941   Cancelled treatment:       Reason Eval/Treat Not Completed: Medical issues which prohibited therapy; BSE ordered last week, however pt moved to ICU and is currently intubated. SLP will follow per direction of MD.  Thank you,  Genene Churn, [place]    [place] 02/22/2016, 3:07 PM

## 2016-02-22 NOTE — Addendum Note (Signed)
Addendum  created 02/22/16 1537 by Vista Deck, CRNA   Charge Capture section accepted

## 2016-02-22 NOTE — Progress Notes (Signed)
Initial Nutrition Assessment  DOCUMENTATION CODES:  Obese Class III     INTERVENTION:  Vital High Protein @ 40 ml/hr (960 ml every 24 hr) via OGT ml and add 30 ml Prostat TID  Tube feeding regimen provides 1260 kcal, 129 grams of protein, and 802 ml of H2O.     NUTRITION DIAGNOSIS:   Inadequate oral intake related to inability to eat as evidenced by NPO status.  GOAL:   Provide needs based on ASPEN/SCCM guidelines   MONITOR:   TF tolerance, I & O's, Vent status, Labs, Weight trends  REASON FOR ASSESSMENT:   Consult Enteral/tube feeding initiation and management  ASSESSMENT:  Patient is currently intubated on ventilator support day 2. Unable to wean today and MD has started tube feeds. He has pneumonia.    MV: 7.6 L/min Temp (24hrs), Avg:99.8 F (37.7 C), Min:98.9 F (37.2 C), Max:100.6 F (38.1 C)  Sedative: FentaNYL   .  Recent Labs Lab 02/20/16 [phone] 02/21/16 0656 02/22/16 0548 02/22/16 0811  NA 139 144 146*  --   K 4.8 4.2 4.0  --   CL 106 113* 115*  --   CO2 27 28 29   --   BUN 35* 46* 31*  --   CREATININE 0.66 0.76 0.71  --   CALCIUM 8.6* 8.3* 8.0*  --   MG  --   --   --  2.3  PHOS  --   --   --  2.0*  GLUCOSE 366* 263* 297*  --    Labs: Sodium 146,  Phos 2.0, Glucose 297   Meds: PPI, Insulin, Solu-Medrol  Diet Order:  Diet NPO time specified  Skin: Stage II left buttock  Last BM:  10/22  Height:   Ht Readings from Last 1 Encounters:  02/22/16 5\' 11"  (1.803 m)    Weight:   Wt Readings from Last 1 Encounters:  02/22/16 (!) 306 lb 7 oz (139 kg)    Ideal Body Weight:  78 kg  BMI:  Body mass index is 42.74 kg/m.  Estimated Nutritional Needs:   Kcal:  [id number]  Protein:  156-179 gr  Fluid:  per MD   EDUCATION NEEDS:   Education needs no appropriate at this time    Colman Cater MS,RD,CSG,LDN Office: [phone] Pager: [phone]

## 2016-02-22 NOTE — Progress Notes (Signed)
Subjective: He remains intubated sedated and on mechanical ventilation. He is however able to communicate with nods and gestures. He seems comfortable. He's not having a lot of secretions now.  Objective: Vital signs in last 24 hours: Temp:  [98 F (36.7 C)-100.6 F (38.1 C)] 100 F (37.8 C) (10/23 0719) Pulse Rate:  [75-120] 101 (10/23 0719) Resp:  [0-19] 16 (10/23 0719) BP: (91-116)/(53-78) 103/62 (10/23 0600) SpO2:  [93 %-98 %] 95 % (10/23 0719) FiO2 (%):  [40 %-50 %] 40 % (10/23 0600) Weight:  [139 kg (306 lb 7 oz)] 139 kg (306 lb 7 oz) (10/23 0500) Weight change: 0 kg (0 lb) Last BM Date: 02/17/16  Intake/Output from previous day: 10/22 0701 - 10/23 0700 In: 3532.5 [I.V.:2682.5; IV Piggyback:850] Out: 9833 [Urine:1550; Emesis/NG output:125]  PHYSICAL EXAM General appearance: Intubated and sedated. He is arousable and able to communicate. He looks pretty comfortable. He is morbidly obese Resp: rhonchi bilaterally Cardio: regular rate and rhythm, S1, S2 normal, no murmur, click, rub or gallop GI: soft, non-tender; bowel sounds normal; no masses,  no organomegaly Extremities: Chronic venous stasis changes. Trace edema Mucous membranes are moist. Pupils are reactive. Lab Results:  Results for orders placed or performed during the hospital encounter of 02/18/16 (from the past 48 hour(s))  Lactic acid, plasma     Status: Abnormal   Collection Time: 02/20/16  9:47 AM  Result Value Ref Range   Lactic Acid, Venous 2.1 (HH) 0.5 - 1.9 mmol/L    Comment: CRITICAL RESULT CALLED TO, READ BACK BY AND VERIFIED WITH: MCDANIEL,M AT 1104 ON 02/20/16 BY MOSLEY,J   Glucose, capillary     Status: Abnormal   Collection Time: 02/20/16 11:28 AM  Result Value Ref Range   Glucose-Capillary 404 (H) 65 - 99 mg/dL   Comment 1 Notify RN    Comment 2 Document in Chart   Draw ABG 1 hour after initiation of ventilator     Status: Abnormal   Collection Time: 02/20/16 11:40 AM  Result Value Ref  Range   FIO2 50.00    Delivery systems VENTILATOR    Mode PRESSURE REGULATED VOLUME CONTROL    VT 550 mL   LHR 16 resp/min   Peep/cpap 5.0 cm H20   pH, Arterial 7.351 7.350 - 7.450   pCO2 arterial 50.2 (H) 32.0 - 48.0 mmHg   pO2, Arterial 76.3 (L) 83.0 - 108.0 mmHg   Bicarbonate 25.6 20.0 - 28.0 mmol/L   Acid-Base Excess 2.1 (H) 0.0 - 2.0 mmol/L   O2 Saturation 93.9 %   Patient temperature 37.0    Collection site LEFT RADIAL    Drawn by [id number]    Sample type ARTERIAL    Allens test (pass/fail) PASS PASS  Glucose, capillary     Status: Abnormal   Collection Time: 02/20/16  5:05 PM  Result Value Ref Range   Glucose-Capillary 419 (H) 65 - 99 mg/dL   Comment 1 Notify RN    Comment 2 Document in Chart   Glucose, capillary     Status: Abnormal   Collection Time: 02/20/16  7:36 PM  Result Value Ref Range   Glucose-Capillary 327 (H) 65 - 99 mg/dL  Glucose, capillary     Status: Abnormal   Collection Time: 02/21/16 12:08 AM  Result Value Ref Range   Glucose-Capillary 251 (H) 65 - 99 mg/dL  Blood gas, arterial     Status: Abnormal   Collection Time: 02/21/16  4:55 AM  Result Value Ref Range  FIO2 50.00    Delivery systems VENTILATOR    Mode PRESSURE REGULATED VOLUME CONTROL    VT 600 mL   LHR 16 resp/min   Peep/cpap 5.0 cm H20   pH, Arterial 7.408 7.350 - 7.450   pCO2 arterial 47.0 32.0 - 48.0 mmHg   pO2, Arterial 85.1 83.0 - 108.0 mmHg   Bicarbonate 28.0 20.0 - 28.0 mmol/L   Acid-Base Excess 4.6 (H) 0.0 - 2.0 mmol/L   O2 Saturation 96.0 %   Collection site RADIAL    Drawn by [id number]    Sample type ARTERIAL    Allens test (pass/fail) PASS PASS  Glucose, capillary     Status: Abnormal   Collection Time: 02/21/16  5:34 AM  Result Value Ref Range   Glucose-Capillary 236 (H) 65 - 99 mg/dL  CBC     Status: Abnormal   Collection Time: 02/21/16  6:56 AM  Result Value Ref Range   WBC 20.5 (H) 4.0 - 10.5 K/uL   RBC 3.33 (L) 4.22 - 5.81 MIL/uL   Hemoglobin 10.1 (L) 13.0 - 17.0  g/dL    Comment: DELTA CHECK NOTED RESULT REPEATED AND VERIFIED    HCT 31.4 (L) 39.0 - 52.0 %   MCV 94.3 78.0 - 100.0 fL   MCH 30.3 26.0 - 34.0 pg   MCHC 32.2 30.0 - 36.0 g/dL   RDW 13.1 11.5 - 15.5 %   Platelets 256 150 - 400 K/uL  Basic metabolic panel     Status: Abnormal   Collection Time: 02/21/16  6:56 AM  Result Value Ref Range   Sodium 144 135 - 145 mmol/L   Potassium 4.2 3.5 - 5.1 mmol/L   Chloride 113 (H) 101 - 111 mmol/L   CO2 28 22 - 32 mmol/L   Glucose, Bld 263 (H) 65 - 99 mg/dL   BUN 46 (H) 6 - 20 mg/dL   Creatinine, Ser 0.76 0.61 - 1.24 mg/dL   Calcium 8.3 (L) 8.9 - 10.3 mg/dL   GFR calc non Af Amer >60 >60 mL/min   GFR calc Af Amer >60 >60 mL/min    Comment: (NOTE) The eGFR has been calculated using the CKD EPI equation. This calculation has not been validated in all clinical situations. eGFR's persistently <60 mL/min signify possible Chronic Kidney Disease.    Anion gap 3 (L) 5 - 15  Vancomycin, trough     Status: Abnormal   Collection Time: 02/21/16  6:56 AM  Result Value Ref Range   Vancomycin Tr 14 (L) 15 - 20 ug/mL  Glucose, capillary     Status: Abnormal   Collection Time: 02/21/16  7:24 AM  Result Value Ref Range   Glucose-Capillary 253 (H) 65 - 99 mg/dL   Comment 1 Notify RN    Comment 2 Document in Chart   Glucose, capillary     Status: Abnormal   Collection Time: 02/21/16 11:18 AM  Result Value Ref Range   Glucose-Capillary 267 (H) 65 - 99 mg/dL   Comment 1 Notify RN    Comment 2 Document in Chart   Glucose, capillary     Status: Abnormal   Collection Time: 02/21/16  4:57 PM  Result Value Ref Range   Glucose-Capillary 282 (H) 65 - 99 mg/dL   Comment 1 Notify RN    Comment 2 Document in Chart   Glucose, capillary     Status: Abnormal   Collection Time: 02/21/16  7:34 PM  Result Value Ref Range   Glucose-Capillary 284 (  H) 65 - 99 mg/dL  Glucose, capillary     Status: Abnormal   Collection Time: 02/22/16 12:59 AM  Result Value Ref Range    Glucose-Capillary 126 (H) 65 - 99 mg/dL  Glucose, capillary     Status: Abnormal   Collection Time: 02/22/16  1:09 AM  Result Value Ref Range   Glucose-Capillary 244 (H) 65 - 99 mg/dL  Blood gas, arterial     Status: Abnormal   Collection Time: 02/22/16  4:28 AM  Result Value Ref Range   FIO2 40.00    Delivery systems VENTILATOR    Mode PRESSURE REGULATED VOLUME CONTROL    VT 600 mL   LHR 16 resp/min   Peep/cpap 5.0 cm H20   pH, Arterial 7.406 7.350 - 7.450   pCO2 arterial 46.1 32.0 - 48.0 mmHg   pO2, Arterial 79.3 (L) 83.0 - 108.0 mmHg   Bicarbonate 27.4 20.0 - 28.0 mmol/L   Acid-Base Excess 3.9 (H) 0.0 - 2.0 mmol/L   O2 Saturation 95.1 %   Collection site RADIAL    Drawn by [id number]    Sample type ARTERIAL    Allens test (pass/fail) PASS PASS  Glucose, capillary     Status: Abnormal   Collection Time: 02/22/16  5:23 AM  Result Value Ref Range   Glucose-Capillary 306 (H) 65 - 99 mg/dL  Comprehensive metabolic panel     Status: Abnormal   Collection Time: 02/22/16  5:48 AM  Result Value Ref Range   Sodium 146 (H) 135 - 145 mmol/L   Potassium 4.0 3.5 - 5.1 mmol/L   Chloride 115 (H) 101 - 111 mmol/L   CO2 29 22 - 32 mmol/L   Glucose, Bld 297 (H) 65 - 99 mg/dL   BUN 31 (H) 6 - 20 mg/dL   Creatinine, Ser 0.71 0.61 - 1.24 mg/dL   Calcium 8.0 (L) 8.9 - 10.3 mg/dL   Total Protein 5.5 (L) 6.5 - 8.1 g/dL   Albumin 1.9 (L) 3.5 - 5.0 g/dL   AST 18 15 - 41 U/L   ALT 11 (L) 17 - 63 U/L   Alkaline Phosphatase 67 38 - 126 U/L   Total Bilirubin 0.5 0.3 - 1.2 mg/dL   GFR calc non Af Amer >60 >60 mL/min   GFR calc Af Amer >60 >60 mL/min    Comment: (NOTE) The eGFR has been calculated using the CKD EPI equation. This calculation has not been validated in all clinical situations. eGFR's persistently <60 mL/min signify possible Chronic Kidney Disease.   CBC with Differential/Platelet     Status: Abnormal   Collection Time: 02/22/16  5:48 AM  Result Value Ref Range   WBC 13.3 (H)  4.0 - 10.5 K/uL   RBC 3.06 (L) 4.22 - 5.81 MIL/uL   Hemoglobin 9.3 (L) 13.0 - 17.0 g/dL   HCT 29.1 (L) 39.0 - 52.0 %   MCV 95.1 78.0 - 100.0 fL   MCH 30.4 26.0 - 34.0 pg   MCHC 32.0 30.0 - 36.0 g/dL   RDW 13.2 11.5 - 15.5 %   Platelets 248 150 - 400 K/uL   Neutrophils Relative % 89 %   Neutro Abs 11.8 (H) 1.7 - 7.7 K/uL   Lymphocytes Relative 4 %   Lymphs Abs 0.5 (L) 0.7 - 4.0 K/uL   Monocytes Relative 7 %   Monocytes Absolute 1.0 0.1 - 1.0 K/uL   Eosinophils Relative 0 %   Eosinophils Absolute 0.0 0.0 - 0.7 K/uL   Basophils  Relative 0 %   Basophils Absolute 0.0 0.0 - 0.1 K/uL  Glucose, capillary     Status: Abnormal   Collection Time: 02/22/16  7:18 AM  Result Value Ref Range   Glucose-Capillary 273 (H) 65 - 99 mg/dL    ABGS  Recent Labs  02/22/16 0428  PHART 7.406  PO2ART 79.3*  HCO3 27.4   CULTURES Recent Results (from the past 240 hour(s))  Blood culture (routine x 2)     Status: None (Preliminary result)   Collection Time: 02/18/16  7:36 AM  Result Value Ref Range Status   Specimen Description BLOOD RIGHT HAND  Final   Special Requests   Final    BOTTLES DRAWN AEROBIC AND ANAEROBIC AEB=8CC ANA=6CC   Culture NO GROWTH 3 DAYS  Final   Report Status PENDING  Incomplete  Blood culture (routine x 2)     Status: None (Preliminary result)   Collection Time: 02/18/16  7:42 AM  Result Value Ref Range Status   Specimen Description BLOOD LEFT ANTECUBITAL  Final   Special Requests BOTTLES DRAWN AEROBIC AND ANAEROBIC 6CC  Final   Culture NO GROWTH 3 DAYS  Final   Report Status PENDING  Incomplete  MRSA PCR Screening     Status: Abnormal   Collection Time: 02/19/16  4:47 PM  Result Value Ref Range Status   MRSA by PCR POSITIVE (A) NEGATIVE Final    Comment:        The GeneXpert MRSA Assay (FDA approved for NASAL specimens only), is one component of a comprehensive MRSA colonization surveillance program. It is not intended to diagnose MRSA infection nor to guide  or monitor treatment for MRSA infections. CRITICAL RESULT CALLED TO, READ BACK BY AND VERIFIED WITH: PARIS,C AT 2342 ON 02/19/2016 BY WOODS,M    Studies/Results: Dg Chest Port 1 View  Result Date: 02/21/2016 CLINICAL DATA:  Respiratory failure EXAM: PORTABLE CHEST 1 VIEW COMPARISON:  02/20/2016 FINDINGS: Cardiomegaly again noted. Stable NG tube and endotracheal tube position. Central vascular congestion. Persistent right pleural effusion with atelectasis or infiltrate in right lower lobe. Small left pleural effusion with left basilar atelectasis. No convincing pulmonary edema. Left arm PICC line is unchanged in position. IMPRESSION: Stable NG tube and endotracheal tube position. Central vascular congestion. Persistent right pleural effusion with atelectasis or infiltrate in right lower lobe. Small left pleural effusion with left basilar atelectasis. No convincing pulmonary edema. Electronically Signed   By: Lahoma Crocker M.D.   On: 02/21/2016 09:33   Dg Chest Port 1 View  Result Date: 02/20/2016 CLINICAL DATA:  Respiratory failure.  Evaluate endotracheal tube. EXAM: PORTABLE CHEST 1 VIEW COMPARISON:  02/19/2016. FINDINGS: Endotracheal tube is 5.6 cm above the carina and appropriately positioned. A nasogastric tube extends into the abdomen. Lower chest is not completely visualized. Patient rotated towards the right. Left lung appears clear. There continues to be right pleural fluid which is probably loculated. Persistent densities at the right chest base could represent atelectasis. Heart size is grossly stable. IMPRESSION: Endotracheal tube is appropriately positioned above the carina. Persistent pleural-parenchymal disease in the right lung with volume loss. Electronically Signed   By: Markus Daft M.D.   On: 02/20/2016 11:11   Dg Chest Port 1v Same Day  Result Date: 02/20/2016 CLINICAL DATA:  Status post PICC line placement. EXAM: PORTABLE CHEST 1 VIEW COMPARISON:  02/20/2016 at 1050 hours  FINDINGS: Endotracheal tube is 5 cm above the carina. Nasogastric tube extends to the abdomen but the tip is beyond  the image. Again noted is right pleural fluid extending up the lateral aspect of the right chest. There continues to be consolidation and volume loss in the right lower lung. Stable enlargement of the cardiac silhouette. A left arm PICC line has been placed. Catheter tip has a horizontal orientation and probably at the junction of the upper SVC and left innominate vein. IMPRESSION: PICC line tip near the junction of the left innominate vein and SVC. Persistent pleural and parenchymal disease in the right chest. Minimal change from the recent comparison examination. Electronically Signed   By: Markus Daft M.D.   On: 02/20/2016 12:55    Medications:  Prior to Admission:  Prescriptions Prior to Admission  Medication Sig Dispense Refill Last Dose  . AMITIZA 24 MCG capsule Take 2 capsules by mouth daily.   02/17/2016 at Unknown time  . Aspirin-Salicylamide-Caffeine (BC HEADACHE POWDER PO) Take 1 Package by mouth daily as needed (pain).   02/17/2016 at Unknown time  . atorvastatin (LIPITOR) 10 MG tablet Take 10 mg by mouth daily.    02/17/2016 at Unknown time  . benzonatate (TESSALON) 200 MG capsule Take 200 mg by mouth 3 (three) times daily as needed for cough.   unknown  . furosemide (LASIX) 40 MG tablet Take 1 tablet (40 mg total) by mouth 2 (two) times daily. (Patient taking differently: Take 40 mg by mouth daily. ) 4 tablet 0 02/17/2016 at Unknown time  . HYDROcodone-acetaminophen (NORCO) 7.5-325 MG tablet Take 1 tablet by mouth every 6 (six) hours as needed for moderate pain (Must last 30 days.Do not drive or operate machinery while taking this medicine.). 180 tablet 0 unknown  . LORazepam (ATIVAN) 2 MG tablet Take 2 mg by mouth at bedtime.   02/17/2016 at Unknown time  . losartan (COZAAR) 50 MG tablet Take 50 mg by mouth daily.   02/17/2016 at Unknown time  . metFORMIN (GLUCOPHAGE) 500  MG tablet Take 1 tablet by mouth 2 (two) times daily.   02/17/2016 at Unknown time  . mirtazapine (REMERON) 15 MG tablet Take 15 mg by mouth at bedtime.    02/17/2016 at Unknown time  . potassium chloride SA (K-DUR,KLOR-CON) 20 MEQ tablet Take 20 mEq by mouth daily.    02/17/2016 at Unknown time  . pregabalin (LYRICA) 150 MG capsule Take 150 mg by mouth 2 (two) times daily.   02/17/2016 at Unknown time  . traZODone (DESYREL) 50 MG tablet Take 2 tablets by mouth at bedtime.    02/17/2016 at Unknown time   Scheduled: . albuterol  2.5 mg Nebulization QID  . chlorhexidine gluconate (MEDLINE KIT)  15 mL Mouth Rinse BID  . Chlorhexidine Gluconate Cloth  6 each Topical Q0600  . famotidine (PEPCID) IV  20 mg Intravenous Q12H  . feeding supplement (PRO-STAT SUGAR FREE 64)  30 mL Per Tube BID  . feeding supplement (VITAL HIGH PROTEIN)  1,000 mL Per Tube Q24H  . heparin  5,000 Units Subcutaneous Q8H  . imipenem-cilastatin  500 mg Intravenous Q6H  . insulin aspart  0-20 Units Subcutaneous Q4H  . insulin glargine  20 Units Subcutaneous Daily  . LORazepam  1 mg Oral BID  . losartan  50 mg Oral Daily  . mouth rinse  15 mL Mouth Rinse QID  . methylPREDNISolone (SOLU-MEDROL) injection  40 mg Intravenous Q12H  . mirtazapine  15 mg Oral QHS  . mupirocin ointment  1 application Nasal BID  . pregabalin  150 mg Oral BID  . sodium chloride  flush  3 mL Intravenous Q12H  . traZODone  100 mg Oral QHS  . vancomycin  1,250 mg Intravenous Q8H   Continuous: . sodium chloride 75 mL/hr at 02/18/16 1425  . sodium chloride 125 mL/hr at 02/21/16 1500  . fentaNYL infusion INTRAVENOUS 175 mcg/hr (02/22/16 0600)   YOK:[id number], alum & mag hydroxide-simeth, fentaNYL (SUBLIMAZE) injection, fentaNYL (SUBLIMAZE) injection, HYDROcodone-acetaminophen, ketorolac, midazolam, midazolam, ondansetron **OR** ondansetron (ZOFRAN) IV, senna-docusate  Assesment: He was admitted with cavitary pneumonia. He has acute respiratory  failure requiring ventilator support. He does seem better.  He has morbid obesity which is unchanged. He will however need nutritional support if he's not able to come off the ventilator today  He has pressure injury of skin and that's about the same  He has chronic venous stasis which is unchanged  He has been encephalopathic presumably from his pneumonia and that's definitely better  He has elevated troponin which appears to be from demand ischemia Principal Problem:   Cavitary pneumonia Active Problems:   Hemoptysis   Morbid obesity ([place])   Pneumatosis intestinalis   Pressure injury of skin    Plan: Continue current treatments. Attempt extubation today. If he's not able to come off the ventilator we will need to start tube feedings    LOS: 4 days   Jernard Reiber L 02/22/2016, 8:07 AM

## 2016-02-22 NOTE — Progress Notes (Signed)
Advanced Et tube to 25 due to leak around cuff.

## 2016-02-22 NOTE — Progress Notes (Signed)
Inpatient Diabetes Program Recommendations  AACE/ADA: New Consensus Statement on Inpatient Glycemic Control (2015)  Target Ranges:  Prepandial:   less than 140 mg/dL      Peak postprandial:   less than 180 mg/dL (1-2 hours)      Critically ill patients:  140 - 180 mg/dL   Lab Results  Component Value Date   GLUCAP 191 (H) 02/22/2016   HGBA1C 8.7 (H) 02/18/2016    Review of Glycemic Control  Diabetes history: DM 2 Outpatient Diabetes medications: Diet Controlled Current orders for Inpatient glycemic control: Lantus 20 units Daily, Novolog Resistant Q4hrs  Inpatient Diabetes Program Recommendations:   Patient is currently intubated, NPO and possibly to start Tube Feeds today. Patient also on IV Solumedrol 40 mg Q12 hrs. Please d/c current glycemic control orders and start the ICU Glycemic control order set phase 2 IV insulin.  If patient is able to be extubated and have diet ordered, consider increasing basal insulin to Lantus 26-28 units.  Thanks,  Tama Headings RN, MSN, [facility] Inpatient Diabetes Coordinator Team Pager [phone] (8a-5p)

## 2016-02-23 LAB — BLOOD GAS, ARTERIAL
ACID-BASE EXCESS: 3.4 mmol/L — AB (ref 0.0–2.0)
Acid-Base Excess: 3.7 mmol/L — ABNORMAL HIGH (ref 0.0–2.0)
Allens test (pass/fail): POSITIVE — AB
BICARBONATE: 27.3 mmol/L (ref 20.0–28.0)
Bicarbonate: 27 mmol/L (ref 20.0–28.0)
DRAWN BY: 382351
Drawn by: 234301
FIO2: 40
FIO2: 40
MECHVT: 600 mL
Mode: POSITIVE
O2 SAT: 94.7 %
O2 SAT: 93.8 %
PATIENT TEMPERATURE: 37
PATIENT TEMPERATURE: 37
PCO2 ART: 46.9 mmHg (ref 32.0–48.0)
PCO2 ART: 48 mmHg (ref 32.0–48.0)
PEEP/CPAP: 5 cmH2O
PEEP/CPAP: 5 cmH2O
PH ART: 7.393 (ref 7.350–7.450)
PO2 ART: 79 mmHg — AB (ref 83.0–108.0)
PO2 ART: 74.1 mmHg — AB (ref 83.0–108.0)
PRESSURE SUPPORT: 5 cmH2O
RATE: 16 resp/min
pH, Arterial: 7.39 (ref 7.350–7.450)

## 2016-02-23 LAB — CULTURE, BLOOD (ROUTINE X 2)
CULTURE: NO GROWTH
Culture: NO GROWTH

## 2016-02-23 LAB — GLUCOSE, CAPILLARY
GLUCOSE-CAPILLARY: 253 mg/dL — AB (ref 65–99)
GLUCOSE-CAPILLARY: 256 mg/dL — AB (ref 65–99)
Glucose-Capillary: 278 mg/dL — ABNORMAL HIGH (ref 65–99)
Glucose-Capillary: 212 mg/dL — ABNORMAL HIGH (ref 65–99)
Glucose-Capillary: 209 mg/dL — ABNORMAL HIGH (ref 65–99)

## 2016-02-23 LAB — CBC WITH DIFFERENTIAL/PLATELET
BASOS ABS: 0 10*3/uL (ref 0.0–0.1)
Basophils Relative: 0 %
EOS PCT: 0 %
Eosinophils Absolute: 0 10*3/uL (ref 0.0–0.7)
HEMATOCRIT: 29.4 % — AB (ref 39.0–52.0)
HEMOGLOBIN: 9.2 g/dL — AB (ref 13.0–17.0)
LYMPHS ABS: 0.7 10*3/uL (ref 0.7–4.0)
LYMPHS PCT: 5 %
MCH: 30 pg (ref 26.0–34.0)
MCHC: 31.3 g/dL (ref 30.0–36.0)
MCV: 95.8 fL (ref 78.0–100.0)
Monocytes Absolute: 1 10*3/uL (ref 0.1–1.0)
Monocytes Relative: 8 %
NEUTROS ABS: 11.2 10*3/uL — AB (ref 1.7–7.7)
Neutrophils Relative %: 87 %
Platelets: 248 10*3/uL (ref 150–400)
RBC: 3.07 MIL/uL — AB (ref 4.22–5.81)
RDW: 13.3 % (ref 11.5–15.5)
WBC: 12.9 10*3/uL — AB (ref 4.0–10.5)

## 2016-02-23 LAB — BASIC METABOLIC PANEL
BUN: 30 mg/dL — ABNORMAL HIGH (ref 6–20)
CALCIUM: 7.8 mg/dL — AB (ref 8.9–10.3)
CO2: 29 mmol/L (ref 22–32)
CREATININE: 0.71 mg/dL (ref 0.61–1.24)
Chloride: 118 mmol/L — ABNORMAL HIGH (ref 101–111)
Glucose, Bld: 329 mg/dL — ABNORMAL HIGH (ref 65–99)
Potassium: 3.8 mmol/L (ref 3.5–5.1)
SODIUM: 148 mmol/L — AB (ref 135–145)

## 2016-02-23 LAB — MAGNESIUM: Magnesium: 2.4 mg/dL (ref 1.7–2.4)

## 2016-02-23 LAB — VANCOMYCIN, TROUGH: Vancomycin Tr: 17 ug/mL (ref 15–20)

## 2016-02-23 LAB — PHOSPHORUS: PHOSPHORUS: 2.1 mg/dL — AB (ref 2.5–4.6)

## 2016-02-23 MED ORDER — INSULIN GLARGINE 100 UNIT/ML ~~LOC~~ SOLN
30.0000 [IU] | Freq: Every day | SUBCUTANEOUS | Status: DC
  Administered 2016-02-23 – 2016-02-28 (×6): 30 [IU] via SUBCUTANEOUS
  Filled 2016-02-23 (×7): qty 0.3

## 2016-02-23 MED ORDER — INSULIN ASPART 100 UNIT/ML ~~LOC~~ SOLN
0.0000 [IU] | Freq: Three times a day (TID) | SUBCUTANEOUS | Status: DC
  Administered 2016-02-23: 7 [IU] via SUBCUTANEOUS
  Administered 2016-02-23: 11 [IU] via SUBCUTANEOUS
  Administered 2016-02-24 (×2): 7 [IU] via SUBCUTANEOUS
  Administered 2016-02-24: 11 [IU] via SUBCUTANEOUS
  Administered 2016-02-24: 4 [IU] via SUBCUTANEOUS
  Administered 2016-02-25 (×4): 11 [IU] via SUBCUTANEOUS
  Administered 2016-02-26: 20 [IU] via SUBCUTANEOUS
  Administered 2016-02-26: 7 [IU] via SUBCUTANEOUS
  Administered 2016-02-26: 4 [IU] via SUBCUTANEOUS
  Administered 2016-02-26: 11 [IU] via SUBCUTANEOUS
  Administered 2016-02-27: 7 [IU] via SUBCUTANEOUS
  Administered 2016-02-27: 4 [IU] via SUBCUTANEOUS
  Administered 2016-02-27: 15 [IU] via SUBCUTANEOUS
  Administered 2016-02-27: 11 [IU] via SUBCUTANEOUS
  Administered 2016-02-28 (×2): 7 [IU] via SUBCUTANEOUS
  Administered 2016-02-28: 11 [IU] via SUBCUTANEOUS
  Administered 2016-02-28: 7 [IU] via SUBCUTANEOUS
  Administered 2016-02-29: 11 [IU] via SUBCUTANEOUS
  Administered 2016-02-29 (×3): 7 [IU] via SUBCUTANEOUS
  Administered 2016-03-01 (×3): 11 [IU] via SUBCUTANEOUS
  Administered 2016-03-01: 7 [IU] via SUBCUTANEOUS
  Administered 2016-03-02: 4 [IU] via SUBCUTANEOUS
  Administered 2016-03-02: 11 [IU] via SUBCUTANEOUS
  Administered 2016-03-02: 15 [IU] via SUBCUTANEOUS
  Administered 2016-03-02 – 2016-03-03 (×3): 3 [IU] via SUBCUTANEOUS
  Administered 2016-03-03: 7 [IU] via SUBCUTANEOUS
  Administered 2016-03-04: 1 [IU] via SUBCUTANEOUS
  Administered 2016-03-04 (×2): 4 [IU] via SUBCUTANEOUS
  Administered 2016-03-05: 7 [IU] via SUBCUTANEOUS
  Administered 2016-03-05: 11 [IU] via SUBCUTANEOUS
  Administered 2016-03-05: 4 [IU] via SUBCUTANEOUS
  Administered 2016-03-06: 3 [IU] via SUBCUTANEOUS
  Administered 2016-03-06 (×2): 4 [IU] via SUBCUTANEOUS

## 2016-02-23 NOTE — Care Management Note (Signed)
Case Management Note  Patient Details  Name: BASILIOS BRABEC MRN: [id number] Date of Birth: 12-07-1940  If discussed at Long Length of Stay Meetings, dates discussed:  02/23/2016   Sherald Barge, RN 02/23/2016, 3:03 PM

## 2016-02-23 NOTE — Progress Notes (Signed)
Subjective: He remains intubated and on mechanical ventilation. We attempted weaning yesterday and he did okay as far as negative inspiratory force but he was unable to maintain his volumes. He was also somewhat anxious. No other new problems have been noted overnight. No vomiting. No change in cardiac rhythm.  Objective: Vital signs in last 24 hours: Temp:  [98.4 F (36.9 C)-100.7 F (38.2 C)] 100.7 F (38.2 C) (10/24 0400) Pulse Rate:  [53-116] 53 (10/24 0400) Resp:  [10-25] 16 (10/24 0400) BP: (98-151)/(62-86) 118/70 (10/24 0400) SpO2:  [92 %-97 %] 96 % (10/24 0400) FiO2 (%):  [40 %] 40 % (10/24 0302) Weight:  [144.2 kg (317 lb 14.5 oz)] 144.2 kg (317 lb 14.5 oz) (10/24 0500) Weight change: 5.2 kg (11 lb 7.4 oz) Last BM Date: 02/21/16  Intake/Output from previous day: 10/23 0701 - 10/24 0700 In: 5175.8 [I.V.:3615.8; NG/GT:460; IV Piggyback:1100] Out: 1500 [Urine:1500]  PHYSICAL EXAM General appearance: Intubated sedated on mechanical ventilation arousable. He is morbidly obese Resp: rhonchi bilaterally Cardio: regular rate and rhythm, S1, S2 normal, no murmur, click, rub or gallop GI: soft, non-tender; bowel sounds normal; no masses,  no organomegaly Extremities: Chronic venous stasis changes and trace edema Skin warm and dry pupils are reactive mucous membranes are moist Lab Results:  Results for orders placed or performed during the hospital encounter of 02/18/16 (from the past 48 hour(s))  Glucose, capillary     Status: Abnormal   Collection Time: 02/21/16  7:24 AM  Result Value Ref Range   Glucose-Capillary 253 (H) 65 - 99 mg/dL   Comment 1 Notify RN    Comment 2 Document in Chart   Glucose, capillary     Status: Abnormal   Collection Time: 02/21/16 11:18 AM  Result Value Ref Range   Glucose-Capillary 267 (H) 65 - 99 mg/dL   Comment 1 Notify RN    Comment 2 Document in Chart   Glucose, capillary     Status: Abnormal   Collection Time: 02/21/16  4:57 PM  Result  Value Ref Range   Glucose-Capillary 282 (H) 65 - 99 mg/dL   Comment 1 Notify RN    Comment 2 Document in Chart   Glucose, capillary     Status: Abnormal   Collection Time: 02/21/16  7:34 PM  Result Value Ref Range   Glucose-Capillary 284 (H) 65 - 99 mg/dL  Glucose, capillary     Status: Abnormal   Collection Time: 02/22/16 12:59 AM  Result Value Ref Range   Glucose-Capillary 126 (H) 65 - 99 mg/dL  Glucose, capillary     Status: Abnormal   Collection Time: 02/22/16  1:09 AM  Result Value Ref Range   Glucose-Capillary 244 (H) 65 - 99 mg/dL  Blood gas, arterial     Status: Abnormal   Collection Time: 02/22/16  4:28 AM  Result Value Ref Range   FIO2 40.00    Delivery systems VENTILATOR    Mode PRESSURE REGULATED VOLUME CONTROL    VT 600 mL   LHR 16 resp/min   Peep/cpap 5.0 cm H20   pH, Arterial 7.406 7.350 - 7.450   pCO2 arterial 46.1 32.0 - 48.0 mmHg   pO2, Arterial 79.3 (L) 83.0 - 108.0 mmHg   Bicarbonate 27.4 20.0 - 28.0 mmol/L   Acid-Base Excess 3.9 (H) 0.0 - 2.0 mmol/L   O2 Saturation 95.1 %   Collection site RADIAL    Drawn by [id number]    Sample type ARTERIAL    Allens test (pass/fail)  PASS PASS  Glucose, capillary     Status: Abnormal   Collection Time: 02/22/16  5:23 AM  Result Value Ref Range   Glucose-Capillary 306 (H) 65 - 99 mg/dL  Comprehensive metabolic panel     Status: Abnormal   Collection Time: 02/22/16  5:48 AM  Result Value Ref Range   Sodium 146 (H) 135 - 145 mmol/L   Potassium 4.0 3.5 - 5.1 mmol/L   Chloride 115 (H) 101 - 111 mmol/L   CO2 29 22 - 32 mmol/L   Glucose, Bld 297 (H) 65 - 99 mg/dL   BUN 31 (H) 6 - 20 mg/dL   Creatinine, Ser 0.71 0.61 - 1.24 mg/dL   Calcium 8.0 (L) 8.9 - 10.3 mg/dL   Total Protein 5.5 (L) 6.5 - 8.1 g/dL   Albumin 1.9 (L) 3.5 - 5.0 g/dL   AST 18 15 - 41 U/L   ALT 11 (L) 17 - 63 U/L   Alkaline Phosphatase 67 38 - 126 U/L   Total Bilirubin 0.5 0.3 - 1.2 mg/dL   GFR calc non Af Amer >60 >60 mL/min   GFR calc Af Amer >60  >60 mL/min    Comment: (NOTE) The eGFR has been calculated using the CKD EPI equation. This calculation has not been validated in all clinical situations. eGFR's persistently <60 mL/min signify possible Chronic Kidney Disease.   CBC with Differential/Platelet     Status: Abnormal   Collection Time: 02/22/16  5:48 AM  Result Value Ref Range   WBC 13.3 (H) 4.0 - 10.5 K/uL   RBC 3.06 (L) 4.22 - 5.81 MIL/uL   Hemoglobin 9.3 (L) 13.0 - 17.0 g/dL   HCT 29.1 (L) 39.0 - 52.0 %   MCV 95.1 78.0 - 100.0 fL   MCH 30.4 26.0 - 34.0 pg   MCHC 32.0 30.0 - 36.0 g/dL   RDW 13.2 11.5 - 15.5 %   Platelets 248 150 - 400 K/uL   Neutrophils Relative % 89 %   Neutro Abs 11.8 (H) 1.7 - 7.7 K/uL   Lymphocytes Relative 4 %   Lymphs Abs 0.5 (L) 0.7 - 4.0 K/uL   Monocytes Relative 7 %   Monocytes Absolute 1.0 0.1 - 1.0 K/uL   Eosinophils Relative 0 %   Eosinophils Absolute 0.0 0.0 - 0.7 K/uL   Basophils Relative 0 %   Basophils Absolute 0.0 0.0 - 0.1 K/uL  Glucose, capillary     Status: Abnormal   Collection Time: 02/22/16  7:18 AM  Result Value Ref Range   Glucose-Capillary 273 (H) 65 - 99 mg/dL  Magnesium     Status: None   Collection Time: 02/22/16  8:11 AM  Result Value Ref Range   Magnesium 2.3 1.7 - 2.4 mg/dL  Phosphorus     Status: Abnormal   Collection Time: 02/22/16  8:11 AM  Result Value Ref Range   Phosphorus 2.0 (L) 2.5 - 4.6 mg/dL  Blood gas, arterial     Status: Abnormal   Collection Time: 02/22/16 10:15 AM  Result Value Ref Range   FIO2 40.00    Delivery systems VENTILATOR    Mode CONTINUOUS POSITIVE AIRWAY PRESSURE    Peep/cpap 5.0 cm H20   Pressure support 5 cm H20   pH, Arterial 7.392 7.350 - 7.450   pCO2 arterial 48.6 (H) 32.0 - 48.0 mmHg   pO2, Arterial 71.5 (L) 83.0 - 108.0 mmHg   Bicarbonate 27.8 20.0 - 28.0 mmol/L   Acid-Base Excess 4.3 (H) 0.0 -  2.0 mmol/L   O2 Saturation 93.5 %   Collection site RIGHT RADIAL    Drawn by [id number]    Sample type ARTERIAL    Allens  test (pass/fail) ARTERIAL DRAW (A) PASS  Glucose, capillary     Status: Abnormal   Collection Time: 02/22/16 11:55 AM  Result Value Ref Range   Glucose-Capillary 191 (H) 65 - 99 mg/dL  Glucose, capillary     Status: Abnormal   Collection Time: 02/22/16  4:01 PM  Result Value Ref Range   Glucose-Capillary 265 (H) 65 - 99 mg/dL  Magnesium     Status: None   Collection Time: 02/22/16  5:18 PM  Result Value Ref Range   Magnesium 2.3 1.7 - 2.4 mg/dL  Phosphorus     Status: None   Collection Time: 02/22/16  5:18 PM  Result Value Ref Range   Phosphorus 2.5 2.5 - 4.6 mg/dL  Glucose, capillary     Status: Abnormal   Collection Time: 02/22/16  8:09 PM  Result Value Ref Range   Glucose-Capillary 304 (H) 65 - 99 mg/dL   Comment 1 Notify RN   Glucose, capillary     Status: Abnormal   Collection Time: 02/22/16 11:58 PM  Result Value Ref Range   Glucose-Capillary 298 (H) 65 - 99 mg/dL   Comment 1 Notify RN   Glucose, capillary     Status: Abnormal   Collection Time: 02/23/16  3:42 AM  Result Value Ref Range   Glucose-Capillary 278 (H) 65 - 99 mg/dL   Comment 1 Notify RN   CBC with Differential/Platelet     Status: Abnormal   Collection Time: 02/23/16  4:34 AM  Result Value Ref Range   WBC 12.9 (H) 4.0 - 10.5 K/uL   RBC 3.07 (L) 4.22 - 5.81 MIL/uL   Hemoglobin 9.2 (L) 13.0 - 17.0 g/dL   HCT 29.4 (L) 39.0 - 52.0 %   MCV 95.8 78.0 - 100.0 fL   MCH 30.0 26.0 - 34.0 pg   MCHC 31.3 30.0 - 36.0 g/dL   RDW 13.3 11.5 - 15.5 %   Platelets 248 150 - 400 K/uL   Neutrophils Relative % 87 %   Neutro Abs 11.2 (H) 1.7 - 7.7 K/uL   Lymphocytes Relative 5 %   Lymphs Abs 0.7 0.7 - 4.0 K/uL   Monocytes Relative 8 %   Monocytes Absolute 1.0 0.1 - 1.0 K/uL   Eosinophils Relative 0 %   Eosinophils Absolute 0.0 0.0 - 0.7 K/uL   Basophils Relative 0 %   Basophils Absolute 0.0 0.0 - 0.1 K/uL  Basic metabolic panel     Status: Abnormal   Collection Time: 02/23/16  4:34 AM  Result Value Ref Range    Sodium 148 (H) 135 - 145 mmol/L   Potassium 3.8 3.5 - 5.1 mmol/L   Chloride 118 (H) 101 - 111 mmol/L   CO2 29 22 - 32 mmol/L   Glucose, Bld 329 (H) 65 - 99 mg/dL   BUN 30 (H) 6 - 20 mg/dL   Creatinine, Ser 0.71 0.61 - 1.24 mg/dL   Calcium 7.8 (L) 8.9 - 10.3 mg/dL   GFR calc non Af Amer >60 >60 mL/min   GFR calc Af Amer >60 >60 mL/min    Comment: (NOTE) The eGFR has been calculated using the CKD EPI equation. This calculation has not been validated in all clinical situations. eGFR's persistently <60 mL/min signify possible Chronic Kidney Disease.   Magnesium  Status: None   Collection Time: 02/23/16  4:34 AM  Result Value Ref Range   Magnesium 2.4 1.7 - 2.4 mg/dL  Phosphorus     Status: Abnormal   Collection Time: 02/23/16  4:34 AM  Result Value Ref Range   Phosphorus 2.1 (L) 2.5 - 4.6 mg/dL  Blood gas, arterial     Status: Abnormal   Collection Time: 02/23/16  5:57 AM  Result Value Ref Range   FIO2 40.00    Delivery systems VENTILATOR    Mode PRESSURE REGULATED VOLUME CONTROL    VT 600 mL   LHR 16 resp/min   Peep/cpap 5.0 cm H20   pH, Arterial 7.393 7.350 - 7.450   pCO2 arterial 46.9 32.0 - 48.0 mmHg   pO2, Arterial 79.0 (L) 83.0 - 108.0 mmHg   Bicarbonate 27.0 20.0 - 28.0 mmol/L   Acid-Base Excess 3.4 (H) 0.0 - 2.0 mmol/L   O2 Saturation 94.7 %   Patient temperature 37.0    Collection site RIGHT RADIAL    Drawn by [id number]    Sample type ARTERIAL DRAW    Allens test (pass/fail) POSITIVE (A) PASS    ABGS  Recent Labs  02/23/16 0557  PHART 7.393  PO2ART 79.0*  HCO3 27.0   CULTURES Recent Results (from the past 240 hour(s))  Blood culture (routine x 2)     Status: None (Preliminary result)   Collection Time: 02/18/16  7:36 AM  Result Value Ref Range Status   Specimen Description BLOOD RIGHT HAND  Final   Special Requests   Final    BOTTLES DRAWN AEROBIC AND ANAEROBIC AEB=8CC ANA=6CC   Culture NO GROWTH 4 DAYS  Final   Report Status PENDING  Incomplete   Blood culture (routine x 2)     Status: None (Preliminary result)   Collection Time: 02/18/16  7:42 AM  Result Value Ref Range Status   Specimen Description BLOOD LEFT ANTECUBITAL  Final   Special Requests BOTTLES DRAWN AEROBIC AND ANAEROBIC 6CC  Final   Culture NO GROWTH 4 DAYS  Final   Report Status PENDING  Incomplete  MRSA PCR Screening     Status: Abnormal   Collection Time: 02/19/16  4:47 PM  Result Value Ref Range Status   MRSA by PCR POSITIVE (A) NEGATIVE Final    Comment:        The GeneXpert MRSA Assay (FDA approved for NASAL specimens only), is one component of a comprehensive MRSA colonization surveillance program. It is not intended to diagnose MRSA infection nor to guide or monitor treatment for MRSA infections. CRITICAL RESULT CALLED TO, READ BACK BY AND VERIFIED WITH: PARIS,C AT 2342 ON 02/19/2016 BY WOODS,M    Studies/Results: Dg Chest Port 1 View  Result Date: 02/22/2016 CLINICAL DATA:  Respiratory failure. EXAM: PORTABLE CHEST 1 VIEW COMPARISON:  Radiographs of February 21, 2016. FINDINGS: Stable cardiomediastinal silhouette. Endotracheal and nasogastric tubes are in grossly good position. No pneumothorax is noted. Stable moderate right pleural effusion is noted with probable underlying atelectasis or infiltrate. Stable central pulmonary vascular congestion. Stable position of left subclavian catheter with distal tip in expected position of the SVC. Bony thorax is unremarkable. Stable minimal left basilar subsegmental atelectasis. IMPRESSION: Stable minimal left basilar subsegmental atelectasis. Stable moderate right pleural effusion with probable underlying atelectasis or infiltrate. Support apparatus in grossly good position. Electronically Signed   By: Marijo Conception, M.D.   On: 02/22/2016 10:09   Dg Chest Port 1 View  Result Date: 02/21/2016 CLINICAL  DATA:  Respiratory failure EXAM: PORTABLE CHEST 1 VIEW COMPARISON:  02/20/2016 FINDINGS: Cardiomegaly again  noted. Stable NG tube and endotracheal tube position. Central vascular congestion. Persistent right pleural effusion with atelectasis or infiltrate in right lower lobe. Small left pleural effusion with left basilar atelectasis. No convincing pulmonary edema. Left arm PICC line is unchanged in position. IMPRESSION: Stable NG tube and endotracheal tube position. Central vascular congestion. Persistent right pleural effusion with atelectasis or infiltrate in right lower lobe. Small left pleural effusion with left basilar atelectasis. No convincing pulmonary edema. Electronically Signed   By: Lahoma Crocker M.D.   On: 02/21/2016 09:33    Medications:  Prior to Admission:  Prescriptions Prior to Admission  Medication Sig Dispense Refill Last Dose  . AMITIZA 24 MCG capsule Take 2 capsules by mouth daily.   02/17/2016 at Unknown time  . Aspirin-Salicylamide-Caffeine (BC HEADACHE POWDER PO) Take 1 Package by mouth daily as needed (pain).   02/17/2016 at Unknown time  . atorvastatin (LIPITOR) 10 MG tablet Take 10 mg by mouth daily.    02/17/2016 at Unknown time  . benzonatate (TESSALON) 200 MG capsule Take 200 mg by mouth 3 (three) times daily as needed for cough.   unknown  . furosemide (LASIX) 40 MG tablet Take 1 tablet (40 mg total) by mouth 2 (two) times daily. (Patient taking differently: Take 40 mg by mouth daily. ) 4 tablet 0 02/17/2016 at Unknown time  . HYDROcodone-acetaminophen (NORCO) 7.5-325 MG tablet Take 1 tablet by mouth every 6 (six) hours as needed for moderate pain (Must last 30 days.Do not drive or operate machinery while taking this medicine.). 180 tablet 0 unknown  . LORazepam (ATIVAN) 2 MG tablet Take 2 mg by mouth at bedtime.   02/17/2016 at Unknown time  . losartan (COZAAR) 50 MG tablet Take 50 mg by mouth daily.   02/17/2016 at Unknown time  . metFORMIN (GLUCOPHAGE) 500 MG tablet Take 1 tablet by mouth 2 (two) times daily.   02/17/2016 at Unknown time  . mirtazapine (REMERON) 15 MG  tablet Take 15 mg by mouth at bedtime.    02/17/2016 at Unknown time  . potassium chloride SA (K-DUR,KLOR-CON) 20 MEQ tablet Take 20 mEq by mouth daily.    02/17/2016 at Unknown time  . pregabalin (LYRICA) 150 MG capsule Take 150 mg by mouth 2 (two) times daily.   02/17/2016 at Unknown time  . traZODone (DESYREL) 50 MG tablet Take 2 tablets by mouth at bedtime.    02/17/2016 at Unknown time   Scheduled: . albuterol  2.5 mg Nebulization QID  . chlorhexidine gluconate (MEDLINE KIT)  15 mL Mouth Rinse BID  . Chlorhexidine Gluconate Cloth  6 each Topical Q0600  . famotidine (PEPCID) IV  20 mg Intravenous Q12H  . feeding supplement (PRO-STAT SUGAR FREE 64)  30 mL Per Tube TID  . feeding supplement (VITAL HIGH PROTEIN)  1,000 mL Per Tube Q24H  . heparin  5,000 Units Subcutaneous Q8H  . imipenem-cilastatin  500 mg Intravenous Q6H  . insulin aspart  0-20 Units Subcutaneous Q4H  . insulin glargine  20 Units Subcutaneous Daily  . LORazepam  1 mg Oral BID  . losartan  50 mg Oral Daily  . mouth rinse  15 mL Mouth Rinse QID  . methylPREDNISolone (SOLU-MEDROL) injection  40 mg Intravenous Q12H  . mirtazapine  15 mg Oral QHS  . mupirocin ointment  1 application Nasal BID  . pregabalin  150 mg Oral BID  . sodium  chloride flush  3 mL Intravenous Q12H  . traZODone  100 mg Oral QHS  . vancomycin  1,250 mg Intravenous Q8H   Continuous: . sodium chloride 75 mL/hr at 02/18/16 1425  . sodium chloride 125 mL/hr at 02/23/16 0445  . fentaNYL infusion INTRAVENOUS 175 mcg/hr (02/22/16 2018)   ZXY:[id number], alum & mag hydroxide-simeth, fentaNYL (SUBLIMAZE) injection, fentaNYL (SUBLIMAZE) injection, HYDROcodone-acetaminophen, ketorolac, midazolam, midazolam, ondansetron **OR** ondansetron (ZOFRAN) IV, senna-docusate  Assesment: He has cavitary pneumonia. He seems to be improving. He has respiratory failure requiring ventilator support.  He has morbid obesity which is unchanged. He is on tube feedings  He  has pressure injury of the skin and that is about the same.  He had pneumatosis intestinalis but no medical findings to go with that and he is tolerating tube feedings okay  He has elevated blood sugar at least partially related to acute illness steroids and tube feedings Principal Problem:   Cavitary pneumonia Active Problems:   Hemoptysis   Morbid obesity ([place])   Pneumatosis intestinalis   Pressure injury of skin    Plan: Continue tube feedings continue IV antibiotics IV steroids. Attempt weaning again today. I discussed his situation with his wife and other family members in my office yesterday    LOS: 5 days   Dinia Joynt L 02/23/2016, 7:22 AM

## 2016-02-23 NOTE — Procedures (Signed)
Extubation Procedure Note Patient performed weaning parameter, NIF-25, FVC 940ml and ABG performed. Results called to physician and RT received orders to extubate. RT extubated to 8L high flow nasal cannula, patient maintaining SATs 96%, HR 98 and RR 18. RT will continue to monitor Patient Details:   Name: Richard Fields DOB: August 03, 1940 MRN: [id number]   Airway Documentation:  Airway 8 mm (Active)  Secured at (cm) 24 cm 02/23/2016  8:20 AM  Measured From Lips 02/23/2016  8:20 AM  [place] 02/23/2016  8:20 AM  Secured By [employer] 02/23/2016  8:20 AM  Tube Holder Repositioned Yes 02/23/2016  8:20 AM  Cuff Pressure (cm H2O) 24 cm H2O 02/22/2016  8:22 PM  Site Condition Dry 02/23/2016  8:20 AM    Evaluation  O2 sats: stable throughout Complications: No apparent complications Patient did tolerate procedure well. Bilateral Breath Sounds: Diminished   Yes  Lana Fish 02/23/2016, 10:45 AM

## 2016-02-23 NOTE — Progress Notes (Signed)
Pharmacy Antibiotic Note  Richard Fields is a 75 y.o. male who is morbidly obese, admitted on 02/18/2016 with pneumonia.  Pharmacy has been consulted for VANCOMYCIN and PRIMAXIN dosing.   Vanc trough this am 57mcg/ml  Plan: Cont Vancomycin 1250mg  IV q8h (goal trough 15-20) Primaxin 500mg  IV q6hrs Monitor labs, progress, c/s  Height: 5\' 11"  (180.3 cm) Weight: (!) 317 lb 14.5 oz (144.2 kg) IBW/kg (Calculated) : 75.3  Temp (24hrs), Avg:99.5 F (37.5 C), Min:98.4 F (36.9 C), Max:100.7 F (38.2 C)   Recent Labs Lab 02/18/16 0736 02/18/16 1047 02/19/16 0205 02/20/16 0509 02/20/16 [id number] 02/20/16 [id number] 02/21/16 0656 02/22/16 0548 02/23/16 0434 02/23/16 0802  WBC  --   --  29.1* 24.4*  --   --  20.5* 13.3* 12.9*  --   CREATININE  --   --  0.71  --  0.66  --  0.76 0.71 0.71  --   LATICACIDVEN 1.9 3.5*  --   --   --  2.1*  --   --   --   --   VANCOTROUGH  --   --   --   --   --   --  14*  --   --  17    Estimated Creatinine Clearance: 112.5 mL/min (by C-G formula based on SCr of 0.71 mg/dL).    Allergies  Allergen Reactions  . Penicillins Other (See Comments)    Unknown-reaction is unknown  . 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  Excell Seltzer, PharmD Clinical Pharmacist 02/23/2016 11:07 AM

## 2016-02-23 NOTE — Progress Notes (Signed)
Inpatient Diabetes Program Recommendations  AACE/ADA: New Consensus Statement on Inpatient Glycemic Control (2015)  Target Ranges:  Prepandial:   less than 140 mg/dL      Peak postprandial:   less than 180 mg/dL (1-2 hours)      Critically ill patients:  140 - 180 mg/dL   Results for ARLANDA, KAUPPILA (MRN [id number]) as of 02/23/2016 08:41  Ref. Range 02/22/2016 05:23 02/22/2016 07:18 02/22/2016 11:55 02/22/2016 16:01 02/22/2016 20:09 02/22/2016 23:58 02/23/2016 03:42 02/23/2016 07:45  Glucose-Capillary Latest Ref Range: 65 - 99 mg/dL 306 (H) 273 (H) 191 (H) 265 (H) 304 (H) 298 (H) 278 (H) 253 (H)   Review of Glycemic Control  Diabetes history: DM2 Outpatient Diabetes medications: Metformin 500 mg BID Current orders for Inpatient glycemic control: Lantus 30 units daily, Novolog 0-20 units Q4H  Inpatient Diabetes Program Recommendations: Insulin - Basal: Noted Lantus was increased from 20 to 30 units daily today. Insulin - Tube Feeding Coverage: While on tube feedings, please consider ordering Novolog 5 units Q4H for tube feeding coverage (in additon to Novolog correction scale). If tube feeding is stopped or held then tube feeding coverage would also be stopped or held.  Thanks, Barnie Alderman, RN, MSN, CDE Diabetes Coordinator Inpatient Diabetes Program [phone] (Team Pager from [place] to [place]) [phone] ([place] office) [phone] [facility] office) [phone] [facility] office)

## 2016-02-24 DIAGNOSIS — IMO0002 Reserved for concepts with insufficient information to code with codable children: Secondary | ICD-10-CM | POA: Diagnosis present

## 2016-02-24 DIAGNOSIS — E1165 Type 2 diabetes mellitus with hyperglycemia: Secondary | ICD-10-CM | POA: Diagnosis present

## 2016-02-24 DIAGNOSIS — R7989 Other specified abnormal findings of blood chemistry: Secondary | ICD-10-CM

## 2016-02-24 DIAGNOSIS — A419 Sepsis, unspecified organism: Secondary | ICD-10-CM | POA: Diagnosis present

## 2016-02-24 DIAGNOSIS — R778 Other specified abnormalities of plasma proteins: Secondary | ICD-10-CM | POA: Diagnosis present

## 2016-02-24 LAB — GLUCOSE, CAPILLARY
GLUCOSE-CAPILLARY: 213 mg/dL — AB (ref 65–99)
GLUCOSE-CAPILLARY: 196 mg/dL — AB (ref 65–99)
GLUCOSE-CAPILLARY: 234 mg/dL — AB (ref 65–99)
GLUCOSE-CAPILLARY: 261 mg/dL — AB (ref 65–99)
Glucose-Capillary: 235 mg/dL — ABNORMAL HIGH (ref 65–99)

## 2016-02-24 LAB — CBC WITH DIFFERENTIAL/PLATELET
BASOS ABS: 0.2 10*3/uL — AB (ref 0.0–0.1)
Basophils Relative: 1 %
EOS PCT: 0 %
Eosinophils Absolute: 0 10*3/uL (ref 0.0–0.7)
HEMATOCRIT: 32.2 % — AB (ref 39.0–52.0)
Hemoglobin: 9.9 g/dL — ABNORMAL LOW (ref 13.0–17.0)
LYMPHS ABS: 1.6 10*3/uL (ref 0.7–4.0)
Lymphocytes Relative: 8 %
MCH: 29.6 pg (ref 26.0–34.0)
MCHC: 30.7 g/dL (ref 30.0–36.0)
MCV: 96.4 fL (ref 78.0–100.0)
MONO ABS: 1.8 10*3/uL — AB (ref 0.1–1.0)
MONOS PCT: 9 %
NEUTROS ABS: 16 10*3/uL — AB (ref 1.7–7.7)
Neutrophils Relative %: 82 %
PLATELETS: 270 10*3/uL (ref 150–400)
RBC: 3.34 MIL/uL — AB (ref 4.22–5.81)
RDW: 13.4 % (ref 11.5–15.5)
WBC: 19.6 10*3/uL — AB (ref 4.0–10.5)

## 2016-02-24 LAB — BASIC METABOLIC PANEL
Anion gap: 4 — ABNORMAL LOW (ref 5–15)
BUN: 23 mg/dL — ABNORMAL HIGH (ref 6–20)
CALCIUM: 7.9 mg/dL — AB (ref 8.9–10.3)
CO2: 30 mmol/L (ref 22–32)
CREATININE: 0.62 mg/dL (ref 0.61–1.24)
Chloride: 116 mmol/L — ABNORMAL HIGH (ref 101–111)
GFR calc Af Amer: >60 mL/min (ref >60)
GLUCOSE: 236 mg/dL — AB (ref 65–99)
Potassium: 3.4 mmol/L — ABNORMAL LOW (ref 3.5–5.1)
Sodium: 150 mmol/L — ABNORMAL HIGH (ref 135–145)

## 2016-02-24 NOTE — Evaluation (Signed)
Physical Therapy Evaluation Patient Details Name: Richard Fields MRN: [id number] DOB: August 02, 1940 Today's Date: 02/24/2016   History of Present Illness  75 y.o. male with a past medical history significant for DM, HLD, HTN, and peripheral neuropathy, who presents by ambulance with complaints of blackish/brownish hemoptysis that onset 1 month ago. Very poor historian. H/o mainly obtained by caregiver and wife at bedside. Per wife he has associated right shoulder pain and profuse diaphoresis overnight. He denies any fever or chills. While in the ED, he was started on vancomycin and admitted for further evaluation of cavitary pneumonia.  CT Chest: RLL ATX surrounding a non-enhancing cavitary lesion, likely a cavitary PNA. CTA Abdomen: pneumatosis of the transverse colon and a segment of the descending colon  Clinical Impression  Pt received in bed, lethargic, but is agreeable to PT tx.  Pt attempts answering PLOF questions, but difficult to understand with BiPAP mask on.  Pt required +3 person assist for transfer supine<>sit and +4 person assistance for transfer sit<>supine.  Further mobility is limited due to elevated HR up to 150bpm as well as poor static sitting balance with posterior/right lean.  At this point, pt is recommended for SNF at d/c, and also recommend OT consult to increase mobilization as well as encouraging pt to participate in ADL's.     Follow Up Recommendations SNF    Equipment Recommendations  Other (comment);None recommended by PT    Recommendations for Other Services OT consult     Precautions / Restrictions Precautions Precautions: Fall Precaution Comments: Due to current immobility Restrictions Weight Bearing Restrictions: No      Mobility  Bed Mobility Overal bed mobility: +2 for physical assistance;Needs Assistance (+4 assistance for lines/wires/physical assistance) Bed Mobility: Rolling;Supine to Sit;Sit to Supine Rolling: Total assist (+4 assistance)    Supine to sit: Max assist;HOB elevated (+3 assistance) Sit to supine: Total assist (+4 assistance, bed in trendelenburg, and max inflated. )      Transfers Overall transfer level:  (NT due to poor balance while sitting EOB. )                  Ambulation/Gait                Stairs            Wheelchair Mobility    Modified Rankin (Stroke Patients Only)       Balance Overall balance assessment: Needs assistance Sitting-balance support: Bilateral upper extremity supported;Feet supported Sitting balance-Leahy Scale: Poor   Postural control: Posterior lean;Right lateral lean                                   Pertinent Vitals/Pain Pain Assessment: No/denies pain    Home Living   Living Arrangements: Spouse/significant other   Type of Home: House Home Access: Stairs to enter   [employer] of Steps: Pt unable to indicate how many steps Home Layout: One level Home Equipment: Walker - 2 wheels Additional Comments: Unable to determine if pt has additional equipment at home.     Prior Function                 Hand Dominance        Extremity/Trunk Assessment   Upper Extremity Assessment: Generalized weakness           Lower Extremity Assessment: Generalized weakness         Communication   Communication:  Expressive difficulties (Due to BiPAP mask and level of alertness)  Cognition Arousal/Alertness: Lethargic Behavior During Therapy: WFL for tasks assessed/performed Overall Cognitive Status:  (unable to fully assess - pt having difficulty following commands at times)                      General Comments      Exercises     Assessment/Plan    PT Assessment Patient needs continued PT services  PT Problem List Decreased strength;Decreased activity tolerance;Decreased balance;Decreased mobility;Decreased cognition;Decreased knowledge of use of DME;Decreased safety awareness;Decreased knowledge  of precautions;Cardiopulmonary status limiting activity;Obesity;Decreased skin integrity          PT Treatment Interventions DME instruction;Gait training;Functional mobility training;Therapeutic activities;Therapeutic exercise;Balance training;Neuromuscular re-education;Patient/family education    PT Goals (Current goals can be found in the Care Plan section)  Acute Rehab PT Goals PT Goal Formulation: Patient unable to participate in goal setting Time For Goal Achievement: 03/09/16 Potential to Achieve Goals: Fair    Frequency Min 5X/week   Barriers to discharge   not fully known yet    Co-evaluation               End of Session   Activity Tolerance: Treatment limited secondary to medical complications (Comment) (Elevated HR) Patient left: in bed;with call bell/phone within reach Nurse Communication: Mobility status;Need for lift equipment (Mysti, RN notified of pt's mobiltiy level, and will need to use Max[employer] sky for transfers OOB)    Functional Assessment Tool Used: [employer] "6-clicks"  Functional Limitation: Mobility: Walking and moving around Mobility: Walking and Moving Around Current Status [phone]): At least 80 percent but less than 100 percent impaired, limited or restricted Mobility: Walking and Moving Around Goal Status [phone]): At least 60 percent but less than 80 percent impaired, limited or restricted    Time: 1335-1400 PT Time Calculation (min) (ACUTE ONLY): 25 min   Charges:   PT Evaluation $PT Eval High Complexity: 1 Procedure PT Treatments $Therapeutic Activity: 8-22 mins   PT G Codes:   PT G-Codes **NOT FOR INPATIENT CLASS** Functional Assessment Tool Used: [employer] "6-clicks"  Functional Limitation: Mobility: Walking and moving around Mobility: Walking and Moving Around Current Status [phone]): At least 80 percent but less than 100 percent impaired, limited or restricted Mobility: Walking and Moving Around Goal  Status [phone]): At least 60 percent but less than 80 percent impaired, limited or restricted   Beth Jowanna Loeffler, PT, DPT X: [phone]

## 2016-02-24 NOTE — Progress Notes (Signed)
**Note De-identified Joanne Brander Obfuscation** Patient removed from BIPAP and placed on 8 L HFNC; tolerating well .  RRT to continue to monitor 

## 2016-02-24 NOTE — Progress Notes (Addendum)
Subjective: He is on BiPAP now which she wore overnight. He was able to be extubated yesterday. He seems less confused. No new complaints. No nausea or vomiting. He's tolerated liquids so far. He will be on carb modified diet. He is still somewhat short of breath and he is still coughing.  Objective: Vital signs in last 24 hours: Temp:  [98.3 F (36.8 C)-100.1 F (37.8 C)] 98.3 F (36.8 C) (10/25 0727) Pulse Rate:  [64-125] 105 (10/25 0727) Resp:  [0-36] 12 (10/25 0727) BP: (127-190)/(69-108) 161/86 (10/25 0600) SpO2:  [91 %-97 %] 97 % (10/25 0727) FiO2 (%):  [40 %] 40 % (10/25 0518) Weight:  [143.2 kg (315 lb 11.2 oz)] 143.2 kg (315 lb 11.2 oz) (10/25 0518) Weight change: -1 kg (-2 lb 3.3 oz) Last BM Date: 02/21/16  Intake/Output from previous day: 10/24 0701 - 10/25 0700 In: 4014.6 [P.O.:320; I.V.:1983.9; NG/GT:560.7; IV Piggyback:1150] Out: 2350 [Urine:2350]  PHYSICAL EXAM General appearance: alert, cooperative, mild distress, morbidly obese and On BiPAP. Speech is somewhat difficult to understand because of the mask Resp: Rhonchi on right more than left Cardio: regular rate and rhythm, S1, S2 normal, no murmur, click, rub or gallop GI: soft, non-tender; bowel sounds normal; no masses,  no organomegaly Extremities: Chronic venous stasis. Trace edema Mucous membranes are moist. Pupils are reactive. His skin is warm and dry  Lab Results:  Results for orders placed or performed during the hospital encounter of 02/18/16 (from the past 48 hour(s))  Magnesium     Status: None   Collection Time: 02/22/16  8:11 AM  Result Value Ref Range   Magnesium 2.3 1.7 - 2.4 mg/dL  Phosphorus     Status: Abnormal   Collection Time: 02/22/16  8:11 AM  Result Value Ref Range   Phosphorus 2.0 (L) 2.5 - 4.6 mg/dL  Blood gas, arterial     Status: Abnormal   Collection Time: 02/22/16 10:15 AM  Result Value Ref Range   FIO2 40.00    Delivery systems VENTILATOR    Mode CONTINUOUS POSITIVE  AIRWAY PRESSURE    Peep/cpap 5.0 cm H20   Pressure support 5 cm H20   pH, Arterial 7.392 7.350 - 7.450   pCO2 arterial 48.6 (H) 32.0 - 48.0 mmHg   pO2, Arterial 71.5 (L) 83.0 - 108.0 mmHg   Bicarbonate 27.8 20.0 - 28.0 mmol/L   Acid-Base Excess 4.3 (H) 0.0 - 2.0 mmol/L   O2 Saturation 93.5 %   Collection site RIGHT RADIAL    Drawn by [phone]    Sample type ARTERIAL    Allens test (pass/fail) ARTERIAL DRAW (A) PASS  Glucose, capillary     Status: Abnormal   Collection Time: 02/22/16 11:55 AM  Result Value Ref Range   Glucose-Capillary 191 (H) 65 - 99 mg/dL  Glucose, capillary     Status: Abnormal   Collection Time: 02/22/16  4:01 PM  Result Value Ref Range   Glucose-Capillary 265 (H) 65 - 99 mg/dL  Magnesium     Status: None   Collection Time: 02/22/16  5:18 PM  Result Value Ref Range   Magnesium 2.3 1.7 - 2.4 mg/dL  Phosphorus     Status: None   Collection Time: 02/22/16  5:18 PM  Result Value Ref Range   Phosphorus 2.5 2.5 - 4.6 mg/dL  Glucose, capillary     Status: Abnormal   Collection Time: 02/22/16  8:09 PM  Result Value Ref Range   Glucose-Capillary 304 (H) 65 - 99 mg/dL  Comment 1 Notify RN   Glucose, capillary     Status: Abnormal   Collection Time: 02/22/16 11:58 PM  Result Value Ref Range   Glucose-Capillary 298 (H) 65 - 99 mg/dL   Comment 1 Notify RN   Glucose, capillary     Status: Abnormal   Collection Time: 02/23/16  3:42 AM  Result Value Ref Range   Glucose-Capillary 278 (H) 65 - 99 mg/dL   Comment 1 Notify RN   CBC with Differential/Platelet     Status: Abnormal   Collection Time: 02/23/16  4:34 AM  Result Value Ref Range   WBC 12.9 (H) 4.0 - 10.5 K/uL   RBC 3.07 (L) 4.22 - 5.81 MIL/uL   Hemoglobin 9.2 (L) 13.0 - 17.0 g/dL   HCT 29.4 (L) 39.0 - 52.0 %   MCV 95.8 78.0 - 100.0 fL   MCH 30.0 26.0 - 34.0 pg   MCHC 31.3 30.0 - 36.0 g/dL   RDW 13.3 11.5 - 15.5 %   Platelets 248 150 - 400 K/uL   Neutrophils Relative % 87 %   Neutro Abs 11.2 (H) 1.7 -  7.7 K/uL   Lymphocytes Relative 5 %   Lymphs Abs 0.7 0.7 - 4.0 K/uL   Monocytes Relative 8 %   Monocytes Absolute 1.0 0.1 - 1.0 K/uL   Eosinophils Relative 0 %   Eosinophils Absolute 0.0 0.0 - 0.7 K/uL   Basophils Relative 0 %   Basophils Absolute 0.0 0.0 - 0.1 K/uL  Basic metabolic panel     Status: Abnormal   Collection Time: 02/23/16  4:34 AM  Result Value Ref Range   Sodium 148 (H) 135 - 145 mmol/L   Potassium 3.8 3.5 - 5.1 mmol/L   Chloride 118 (H) 101 - 111 mmol/L   CO2 29 22 - 32 mmol/L   Glucose, Bld 329 (H) 65 - 99 mg/dL   BUN 30 (H) 6 - 20 mg/dL   Creatinine, Ser 0.71 0.61 - 1.24 mg/dL   Calcium 7.8 (L) 8.9 - 10.3 mg/dL   GFR calc non Af Amer >60 >60 mL/min   GFR calc Af Amer >60 >60 mL/min    Comment: (NOTE) The eGFR has been calculated using the CKD EPI equation. This calculation has not been validated in all clinical situations. eGFR's persistently <60 mL/min signify possible Chronic Kidney Disease.   Magnesium     Status: None   Collection Time: 02/23/16  4:34 AM  Result Value Ref Range   Magnesium 2.4 1.7 - 2.4 mg/dL  Phosphorus     Status: Abnormal   Collection Time: 02/23/16  4:34 AM  Result Value Ref Range   Phosphorus 2.1 (L) 2.5 - 4.6 mg/dL  Blood gas, arterial     Status: Abnormal   Collection Time: 02/23/16  5:57 AM  Result Value Ref Range   FIO2 40.00    Delivery systems VENTILATOR    Mode PRESSURE REGULATED VOLUME CONTROL    VT 600 mL   LHR 16 resp/min   Peep/cpap 5.0 cm H20   pH, Arterial 7.393 7.350 - 7.450   pCO2 arterial 46.9 32.0 - 48.0 mmHg   pO2, Arterial 79.0 (L) 83.0 - 108.0 mmHg   Bicarbonate 27.0 20.0 - 28.0 mmol/L   Acid-Base Excess 3.4 (H) 0.0 - 2.0 mmol/L   O2 Saturation 94.7 %   Patient temperature 37.0    Collection site RIGHT RADIAL    Drawn by [phone]    Sample type ARTERIAL DRAW  Allens test (pass/fail) POSITIVE (A) PASS  Glucose, capillary     Status: Abnormal   Collection Time: 02/23/16  7:45 AM  Result Value Ref  Range   Glucose-Capillary 253 (H) 65 - 99 mg/dL  Vancomycin, trough     Status: None   Collection Time: 02/23/16  8:02 AM  Result Value Ref Range   Vancomycin Tr 17 15 - 20 ug/mL  Blood gas, arterial     Status: Abnormal   Collection Time: 02/23/16 10:00 AM  Result Value Ref Range   FIO2 40.00    Delivery systems VENTILATOR    Mode CONTINUOUS POSITIVE AIRWAY PRESSURE    Peep/cpap 5.0 cm H20   Pressure support 5 cm H20   pH, Arterial 7.390 7.350 - 7.450   pCO2 arterial 48.0 32.0 - 48.0 mmHg   pO2, Arterial 74.1 (L) 83.0 - 108.0 mmHg   Bicarbonate 27.3 20.0 - 28.0 mmol/L   Acid-Base Excess 3.7 (H) 0.0 - 2.0 mmol/L   O2 Saturation 93.8 %   Patient temperature 37.0    Collection site RIGHT RADIAL    Drawn by [phone]    Sample type ARTERIAL DRAW    Allens test (pass/fail) PASS PASS  Glucose, capillary     Status: Abnormal   Collection Time: 02/23/16 11:08 AM  Result Value Ref Range   Glucose-Capillary 212 (H) 65 - 99 mg/dL  Glucose, capillary     Status: Abnormal   Collection Time: 02/23/16  4:26 PM  Result Value Ref Range   Glucose-Capillary 209 (H) 65 - 99 mg/dL  Glucose, capillary     Status: Abnormal   Collection Time: 02/23/16 11:00 PM  Result Value Ref Range   Glucose-Capillary 256 (H) 65 - 99 mg/dL   Comment 1 Notify RN   Glucose, capillary     Status: Abnormal   Collection Time: 02/24/16  4:06 AM  Result Value Ref Range   Glucose-Capillary 213 (H) 65 - 99 mg/dL   Comment 1 Notify RN   CBC with Differential/Platelet     Status: Abnormal   Collection Time: 02/24/16  4:21 AM  Result Value Ref Range   WBC 19.6 (H) 4.0 - 10.5 K/uL   RBC 3.34 (L) 4.22 - 5.81 MIL/uL   Hemoglobin 9.9 (L) 13.0 - 17.0 g/dL   HCT 32.2 (L) 39.0 - 52.0 %   MCV 96.4 78.0 - 100.0 fL   MCH 29.6 26.0 - 34.0 pg   MCHC 30.7 30.0 - 36.0 g/dL   RDW 13.4 11.5 - 15.5 %   Platelets 270 150 - 400 K/uL   Neutrophils Relative % 82 %   Lymphocytes Relative 8 %   Monocytes Relative 9 %   Eosinophils  Relative 0 %   Basophils Relative 1 %   Neutro Abs 16.0 (H) 1.7 - 7.7 K/uL   Lymphs Abs 1.6 0.7 - 4.0 K/uL   Monocytes Absolute 1.8 (H) 0.1 - 1.0 K/uL   Eosinophils Absolute 0.0 0.0 - 0.7 K/uL   Basophils Absolute 0.2 (H) 0.0 - 0.1 K/uL   WBC Morphology MILD LEFT SHIFT (1-5% METAS, OCC MYELO, OCC BANDS)     Comment: ATYPICAL LYMPHOCYTES  Basic metabolic panel     Status: Abnormal   Collection Time: 02/24/16  4:21 AM  Result Value Ref Range   Sodium 150 (H) 135 - 145 mmol/L   Potassium 3.4 (L) 3.5 - 5.1 mmol/L   Chloride 116 (H) 101 - 111 mmol/L   CO2 30 22 - 32 mmol/L  Glucose, Bld 236 (H) 65 - 99 mg/dL   BUN 23 (H) 6 - 20 mg/dL   Creatinine, Ser 0.62 0.61 - 1.24 mg/dL   Calcium 7.9 (L) 8.9 - 10.3 mg/dL   GFR calc non Af Amer >60 >60 mL/min   GFR calc Af Amer >60 >60 mL/min    Comment: (NOTE) The eGFR has been calculated using the CKD EPI equation. This calculation has not been validated in all clinical situations. eGFR's persistently <60 mL/min signify possible Chronic Kidney Disease.    Anion gap 4 (L) 5 - 15  Glucose, capillary     Status: Abnormal   Collection Time: 02/24/16  7:26 AM  Result Value Ref Range   Glucose-Capillary 196 (H) 65 - 99 mg/dL    ABGS  Recent Labs  02/23/16 1000  PHART 7.390  PO2ART 74.1*  HCO3 27.3   CULTURES Recent Results (from the past 240 hour(s))  Blood culture (routine x 2)     Status: None   Collection Time: 02/18/16  7:36 AM  Result Value Ref Range Status   Specimen Description BLOOD RIGHT HAND  Final   Special Requests   Final    BOTTLES DRAWN AEROBIC AND ANAEROBIC AEB=8CC ANA=6CC   Culture NO GROWTH 5 DAYS  Final   Report Status 02/23/2016 FINAL  Final  Blood culture (routine x 2)     Status: None   Collection Time: 02/18/16  7:42 AM  Result Value Ref Range Status   Specimen Description BLOOD LEFT ANTECUBITAL  Final   Special Requests BOTTLES DRAWN AEROBIC AND ANAEROBIC 6CC  Final   Culture NO GROWTH 5 DAYS  Final    Report Status 02/23/2016 FINAL  Final  MRSA PCR Screening     Status: Abnormal   Collection Time: 02/19/16  4:47 PM  Result Value Ref Range Status   MRSA by PCR POSITIVE (A) NEGATIVE Final    Comment:        The GeneXpert MRSA Assay (FDA approved for NASAL specimens only), is one component of a comprehensive MRSA colonization surveillance program. It is not intended to diagnose MRSA infection nor to guide or monitor treatment for MRSA infections. CRITICAL RESULT CALLED TO, READ BACK BY AND VERIFIED WITH: PARIS,C AT 2342 ON 02/19/2016 BY WOODS,M    Studies/Results: Dg Chest Port 1 View  Result Date: 02/22/2016 CLINICAL DATA:  Respiratory failure. EXAM: PORTABLE CHEST 1 VIEW COMPARISON:  Radiographs of February 21, 2016. FINDINGS: Stable cardiomediastinal silhouette. Endotracheal and nasogastric tubes are in grossly good position. No pneumothorax is noted. Stable moderate right pleural effusion is noted with probable underlying atelectasis or infiltrate. Stable central pulmonary vascular congestion. Stable position of left subclavian catheter with distal tip in expected position of the SVC. Bony thorax is unremarkable. Stable minimal left basilar subsegmental atelectasis. IMPRESSION: Stable minimal left basilar subsegmental atelectasis. Stable moderate right pleural effusion with probable underlying atelectasis or infiltrate. Support apparatus in grossly good position. Electronically Signed   By: Marijo Conception, M.D.   On: 02/22/2016 10:09    Medications:  Prior to Admission:  Prescriptions Prior to Admission  Medication Sig Dispense Refill Last Dose  . AMITIZA 24 MCG capsule Take 2 capsules by mouth daily.   02/17/2016 at Unknown time  . Aspirin-Salicylamide-Caffeine (BC HEADACHE POWDER PO) Take 1 Package by mouth daily as needed (pain).   02/17/2016 at Unknown time  . atorvastatin (LIPITOR) 10 MG tablet Take 10 mg by mouth daily.    02/17/2016 at Unknown time  .  benzonatate  (TESSALON) 200 MG capsule Take 200 mg by mouth 3 (three) times daily as needed for cough.   unknown  . furosemide (LASIX) 40 MG tablet Take 1 tablet (40 mg total) by mouth 2 (two) times daily. (Patient taking differently: Take 40 mg by mouth daily. ) 4 tablet 0 02/17/2016 at Unknown time  . HYDROcodone-acetaminophen (NORCO) 7.5-325 MG tablet Take 1 tablet by mouth every 6 (six) hours as needed for moderate pain (Must last 30 days.Do not drive or operate machinery while taking this medicine.). 180 tablet 0 unknown  . LORazepam (ATIVAN) 2 MG tablet Take 2 mg by mouth at bedtime.   02/17/2016 at Unknown time  . losartan (COZAAR) 50 MG tablet Take 50 mg by mouth daily.   02/17/2016 at Unknown time  . metFORMIN (GLUCOPHAGE) 500 MG tablet Take 1 tablet by mouth 2 (two) times daily.   02/17/2016 at Unknown time  . mirtazapine (REMERON) 15 MG tablet Take 15 mg by mouth at bedtime.    02/17/2016 at Unknown time  . potassium chloride SA (K-DUR,KLOR-CON) 20 MEQ tablet Take 20 mEq by mouth daily.    02/17/2016 at Unknown time  . pregabalin (LYRICA) 150 MG capsule Take 150 mg by mouth 2 (two) times daily.   02/17/2016 at Unknown time  . traZODone (DESYREL) 50 MG tablet Take 2 tablets by mouth at bedtime.    02/17/2016 at Unknown time   Scheduled: . albuterol  2.5 mg Nebulization QID  . famotidine (PEPCID) IV  20 mg Intravenous Q12H  . heparin  5,000 Units Subcutaneous Q8H  . imipenem-cilastatin  500 mg Intravenous Q6H  . insulin aspart  0-20 Units Subcutaneous TID AC & HS  . insulin glargine  30 Units Subcutaneous Daily  . LORazepam  1 mg Oral BID  . losartan  50 mg Oral Daily  . methylPREDNISolone (SOLU-MEDROL) injection  40 mg Intravenous Q12H  . mirtazapine  15 mg Oral QHS  . mupirocin ointment  1 application Nasal BID  . pregabalin  150 mg Oral BID  . sodium chloride flush  3 mL Intravenous Q12H  . traZODone  100 mg Oral QHS  . vancomycin  1,250 mg Intravenous Q8H   Continuous: . sodium  chloride 75 mL/hr at 02/18/16 1425  . sodium chloride 125 mL/hr at 02/23/16 1511  . fentaNYL infusion INTRAVENOUS 50 mcg/hr (02/23/16 0830)   FUX:NATFTDDUK, alum & mag hydroxide-simeth, HYDROcodone-acetaminophen, ondansetron **OR** ondansetron (ZOFRAN) IV, senna-docusate  Assesment: He has cavitary pneumonia. He is improving. He required intubation and mechanical ventilation and is off the ventilator now but is still requiring positive pressure ventilation with BiPAP.  He had metabolic encephalopathy and that is better  He has decubitus. 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 Blood sugars still partially because of acute illness and steroids Principal Problem:   Cavitary pneumonia Active Problems:   Hemoptysis   Morbid obesity ([place])   Pneumatosis intestinalis   Pressure injury of skin    Plan: Continue current medications. He will remain in ICU for now. Try to get him up and out of the bed today. Physical therapy consultation. Reduce his IV fluids   LOS: 6 days   Revel Stellmach L 02/24/2016, 7:30 AM

## 2016-02-24 NOTE — Evaluation (Signed)
Clinical/Bedside Swallow Evaluation Patient Details  Name: Richard Fields MRN: [id number] Date of Birth: 1941/04/08  Today's Date: 02/24/2016 Time: SLP Start Time (ACUTE ONLY): 1715 SLP Stop Time (ACUTE ONLY): 1737 SLP Time Calculation (min) (ACUTE ONLY): 22 min  Past Medical History:  Past Medical History:  Diagnosis Date  . Cervical disc disease   . Chronic constipation   . Diabetes mellitus without complication ([place])    Borderline diabetic-diet controlled  . Hyperlipidemia   . Hypertension   . Peripheral neuropathy [facility])    Past Surgical History:  Past Surgical History:  Procedure Laterality Date  . COLONOSCOPY  2003   RMR: internal hemorrhoids, otherwise normal  . COLONOSCOPY  05/30/2012   Procedure: COLONOSCOPY;  Surgeon: Daneil Dolin, MD;  Location: [facility]ITE;  Service: Endoscopy;  Laterality: N/A;  12:00  . HERNIA REPAIR    . TONSILLECTOMY     HPI:  75 y.o. male with a past medical history significant for DM, HLD, HTN, and peripheral neuropathy, who presents by ambulance with complaints of blackish/brownish hemoptysis that onset 1 month ago. Very poor historian. H/o mainly obtained by caregiver and wife at bedside. Per wife he has associated right shoulder pain and profuse diaphoresis overnight. He denies any fever or chills. While in the ED, he was started on vancomycin and admitted for further evaluation of cavitary pneumonia. Pt was intubated10/21 and extubated 10/24.CT Chest: RLL ATX surrounding a non-enhancing cavitary lesion, likely a cavitary PNA. CTA Abdomen: pneumatosis of the transverse colon and a segment of the descending colon.   Assessment / Plan / Recommendation Clinical Impression  Pt was extubated 10/24 and BSE completed. Pt evaluated at bedside consuming regular, puree and thin liquids.  Pt was reportedly not alert this AM and was deemed "unsafe" for PO at that time but continued to remain on a regular diet. Pt was found in his room after consuming  supper this date.  Oral mechanism revealed edentulous lower and missing dentition upper. Pt demonstrated no overt s/sx of aspiration with any consistencies/textures consumed.Continue to only provide PO when pt is alert. Recommend regular/thin. No further ST needs at this time. ST to sign off.    Aspiration Risk  Mild aspiration risk    Diet Recommendation Regular;Thin liquid   Liquid Administration via: Cup;Straw Medication Administration: Whole meds with puree Supervision: Full supervision/cueing for compensatory strategies Compensations: Minimize environmental distractions;Slow rate;Small sips/bites;Multiple dry swallows after each bite/sip    Other  Recommendations Oral Care Recommendations: Oral care BID     Swallow Study   General Date of Onset: 02/18/16 HPI: 75 y.o. male with a past medical history significant for DM, HLD, HTN, and peripheral neuropathy, who presents by ambulance with complaints of blackish/brownish hemoptysis that onset 1 month ago. Very poor historian. H/o mainly obtained by caregiver and wife at bedside. Per wife he has associated right shoulder pain and profuse diaphoresis overnight. He denies any fever or chills. While in the ED, he was started on vancomycin and admitted for further evaluation of cavitary pneumonia. Pt was intubated10/21 and extubated 10/24.CT Chest: RLL ATX surrounding a non-enhancing cavitary lesion, likely a cavitary PNA. CTA Abdomen: pneumatosis of the transverse colon and a segment of the descending colon. Type of Study: Bedside Swallow Evaluation Diet Prior to this Study: Regular;Thin liquids Temperature Spikes Noted: No Respiratory Status: Nasal cannula History of Recent Intubation: Yes Length of Intubations (days): 3 days Date extubated: 02/23/16 Behavior/Cognition: Cooperative;Pleasant mood Oral Cavity Assessment: Within Functional Limits Oral Cavity -  Dentition: Missing dentition Vision: Functional for self-feeding Self-Feeding  Abilities: Needs assist Patient Positioning: Upright in bed Baseline Vocal Quality: Normal Volitional Cough: Strong Volitional Swallow: Able to elicit    Oral/Motor/Sensory Function Overall Oral Motor/Sensory Function: Within functional limits   Ice Chips Ice chips: Within functional limits   Thin Liquid Thin Liquid: Within functional limits             Solid   Richard Fields, CCC-SLP Speech Language Pathologist    Solid: Within functional limits        Richard Fields 02/24/2016,5:38 PM

## 2016-02-24 NOTE — Progress Notes (Signed)
RN attempted to give am PO meds to pt. He was unable to swallow when asked to and was very confused. Will continue to monitor.

## 2016-02-25 LAB — BASIC METABOLIC PANEL
ANION GAP: 3 — AB (ref 5–15)
BUN: 18 mg/dL (ref 6–20)
CHLORIDE: 111 mmol/L (ref 101–111)
CO2: 32 mmol/L (ref 22–32)
Calcium: 7.9 mg/dL — ABNORMAL LOW (ref 8.9–10.3)
Creatinine, Ser: 0.6 mg/dL — ABNORMAL LOW (ref 0.61–1.24)
GFR calc non Af Amer: >60 mL/min (ref >60)
Glucose, Bld: 279 mg/dL — ABNORMAL HIGH (ref 65–99)
POTASSIUM: 3.6 mmol/L (ref 3.5–5.1)
Sodium: 146 mmol/L — ABNORMAL HIGH (ref 135–145)

## 2016-02-25 LAB — CBC WITH DIFFERENTIAL/PLATELET
BASOS ABS: 0 10*3/uL (ref 0.0–0.1)
Basophils Relative: 0 %
EOS PCT: 0 %
Eosinophils Absolute: 0 10*3/uL (ref 0.0–0.7)
HEMATOCRIT: 31.7 % — AB (ref 39.0–52.0)
HEMOGLOBIN: 10 g/dL — AB (ref 13.0–17.0)
LYMPHS PCT: 5 %
Lymphs Abs: 0.8 10*3/uL (ref 0.7–4.0)
MCH: 29.8 pg (ref 26.0–34.0)
MCHC: 31.5 g/dL (ref 30.0–36.0)
MCV: 94.3 fL (ref 78.0–100.0)
MONOS PCT: 6 %
Monocytes Absolute: 1 10*3/uL (ref 0.1–1.0)
Neutro Abs: 14.6 10*3/uL — ABNORMAL HIGH (ref 1.7–7.7)
Neutrophils Relative %: 89 %
Platelets: 265 10*3/uL (ref 150–400)
RBC: 3.36 MIL/uL — AB (ref 4.22–5.81)
RDW: 13.2 % (ref 11.5–15.5)
WBC: 16.4 10*3/uL — AB (ref 4.0–10.5)

## 2016-02-25 LAB — GLUCOSE, CAPILLARY
GLUCOSE-CAPILLARY: 254 mg/dL — AB (ref 65–99)
GLUCOSE-CAPILLARY: 293 mg/dL — AB (ref 65–99)
GLUCOSE-CAPILLARY: 286 mg/dL — AB (ref 65–99)
GLUCOSE-CAPILLARY: 274 mg/dL — AB (ref 65–99)
Glucose-Capillary: 284 mg/dL — ABNORMAL HIGH (ref 65–99)

## 2016-02-25 NOTE — Care Management Note (Signed)
Case Management Note  Patient Details  Name: Richard Fields MRN: [id number] Date of Birth: March 20, 1941   Expected Discharge Date:     03/01/2016             Expected Discharge Plan:  [place]  In-House Referral:  Clinical Social Work  Discharge planning Services  NA  Post Acute Care Choice:  NA Choice offered to:  NA  Status of Service:  In process, will continue to follow  If discussed at Long Length of Stay Meetings, dates discussed:  02/25/2016  Additional Comments: PT has recommended for SNF. CSW is aware and working with pt on placement options.   Sherald Barge, RN 02/25/2016, 2:13 PM

## 2016-02-25 NOTE — Progress Notes (Signed)
Inpatient Diabetes Program Recommendations  AACE/ADA: New Consensus Statement on Inpatient Glycemic Control (2015)  Target Ranges:  Prepandial:   less than 140 mg/dL      Peak postprandial:   less than 180 mg/dL (1-2 hours)      Critically ill patients:  140 - 180 mg/dL   Results for Richard Fields, Richard Fields (MRN [id number]) as of 02/25/2016 08:44  Ref. Range 02/24/2016 04:06 02/24/2016 07:26 02/24/2016 11:16 02/24/2016 16:17 02/24/2016 20:07 02/25/2016 00:09 02/25/2016 07:34  Glucose-Capillary Latest Ref Range: 65 - 99 mg/dL 213 (H) 196 (H) 234 (H) 261 (H) 235 (H) 284 (H) 254 (H)   Review of Glycemic Control  Diabetes history: DM2 Outpatient Diabetes medications: Metformin 500 mg BID Current orders for Inpatient glycemic control: Lantus 30 units daily, Novolog 0-20 units ACHS  Inpatient Diabetes Program Recommendations: Insulin - Basal: If steroids are continued, please consider increasing Lantus to 35 units daily. Insulin - Meal Coverage: If steroids are continued, please consider ordering Novolog 5 units TID with meals for meal coverage if patient eats at least 50% of meal.  Thanks, Barnie Alderman, RN, MSN, CDE Diabetes Coordinator Inpatient Diabetes Program [phone] (Team Pager from 8am to [place])

## 2016-02-25 NOTE — Progress Notes (Signed)
Physical Therapy Treatment Patient Details Name: Richard Fields MRN: [id number] DOB: April 17, 1941 Today's Date: 02/25/2016    History of Present Illness 75 y.o. male with a past medical history significant for DM, HLD, HTN, and peripheral neuropathy, who presents by ambulance with complaints of blackish/brownish hemoptysis that onset 1 month ago. Very poor historian. H/o mainly obtained by caregiver and wife at bedside. Per wife he has associated right shoulder pain and profuse diaphoresis overnight. He denies any fever or chills. While in the ED, he was started on vancomycin and admitted for further evaluation of cavitary pneumonia.  CT Chest: RLL ATX surrounding a non-enhancing cavitary lesion, likely a cavitary PNA. CTA Abdomen: pneumatosis of the transverse colon and a segment of the descending colon    PT Comments    Pt received in bed, off bipap, and more alert today, however, continues to be lethargic and requires constant cues to prevent from falling asleep mid-task.  Pt was able to complete LE exercises with assistance and constant cues for level of alertness.  Will continue to progress pt's mobility and work towards sitting up on the EOB and transfers bed<>chair.  Continue to recommend SNF due to significant amount of assistance pt requires for basic mobility tasks.    Follow Up Recommendations  SNF     Equipment Recommendations  None recommended by PT    Recommendations for Other Services OT consult     Precautions / Restrictions Precautions Precautions: Fall Precaution Comments: Due to current immobility Restrictions Weight Bearing Restrictions: No    Mobility  Bed Mobility                  Transfers                    Ambulation/Gait                 Stairs            Wheelchair Mobility    Modified Rankin (Stroke Patients Only)       Balance                                    Cognition Arousal/Alertness:  Lethargic Behavior During Therapy: WFL for tasks assessed/performed Overall Cognitive Status: Difficult to assess (more alert compared to yesterday, and able to answer history questions. )                      Exercises General Exercises - Lower Extremity Ankle Circles/Pumps: Both;10 reps;Supine;Limitations Ankle Circles/Pumps Limitations: constant vc's to continue reps to 10 Heel Slides: AAROM;Both;10 reps;Supine;Limitations Heel Slides Limitations: constant vc's and tc's to continue with exercise until he got to 10 reps. Assist for proper positining due to excessive B hip external rotation due to body habitus.  Hip ABduction/ADduction: AAROM;Both;10 reps;Supine;Limitations Hip Abduction/Adduction Limitations: constant cues to continue exercises and for which direction to go.     General Comments        Pertinent Vitals/Pain Pain Assessment: No/denies pain    Home Living                      Prior Function            PT Goals (current goals can now be found in the care plan section) Acute Rehab PT Goals PT Goal Formulation: Patient unable to participate in goal setting Time For Goal  Achievement: 03/09/16 Potential to Achieve Goals: Fair    Frequency    Min 5X/week      PT Plan      Co-evaluation             End of Session   Activity Tolerance: Patient tolerated treatment well;Patient limited by fatigue Patient left: in bed;with call bell/phone within reach     Time: 1450-1510 PT Time Calculation (min) (ACUTE ONLY): 20 min  Charges:  $Therapeutic Exercise: 8-22 mins                    G Codes:      Beth Deloros Beretta, PT, DPT X: [phone]

## 2016-02-25 NOTE — Progress Notes (Addendum)
Subjective: He is more alert today. His speech is much more understandable. He has very little memory of what happened to him. He has been seen by physical therapy and recommended to have skilled care facility. OT consultation has been suggested and I will order that. He was seen by speech and low risk for aspiration. He says he still coughing. He is coughing up a little bit of sputum. No chest pain. No abdominal pain no nausea or vomiting. No complaints of headache  Objective: Vital signs in last 24 hours: Temp:  [97.8 F (36.6 C)-99.8 F (37.7 C)] 98.9 F (37.2 C) (10/26 0500) Pulse Rate:  [95-150] 115 (10/26 0322) Resp:  [0-28] 26 (10/26 0322) BP: (147-194)/(62-103) 149/62 (10/26 0322) SpO2:  [94 %-100 %] 97 % (10/26 0757) FiO2 (%):  [40 %] 40 % (10/25 1145) Weight change:  Last BM Date: 02/21/16  Intake/Output from previous day: 10/25 0701 - 10/26 0700 In: 4509.6 [P.O.:30; I.V.:3379.6; IV Piggyback:1100] Out: 2025 [Urine:2025]  PHYSICAL EXAM General appearance: alert, cooperative and morbidly obese Resp: Rhonchi and wheezes on the right more than on the left. His respiratory effort is still slightly increased Cardio: regular rate and rhythm, S1, S2 normal, no murmur, click, rub or gallop GI: soft, non-tender; bowel sounds normal; no masses,  no organomegaly Extremities: Chronic venous stasis changes but no edema He is not wearing his dentures. His mucous membranes are moist. His skin is warm and dry  Lab Results:  Results for orders placed or performed during the hospital encounter of 02/18/16 (from the past 48 hour(s))  Blood gas, arterial     Status: Abnormal   Collection Time: 02/23/16 10:00 AM  Result Value Ref Range   FIO2 40.00    Delivery systems VENTILATOR    Mode CONTINUOUS POSITIVE AIRWAY PRESSURE    Peep/cpap 5.0 cm H20   Pressure support 5 cm H20   pH, Arterial 7.390 7.350 - 7.450   pCO2 arterial 48.0 32.0 - 48.0 mmHg   pO2, Arterial 74.1 (L) 83.0 - 108.0  mmHg   Bicarbonate 27.3 20.0 - 28.0 mmol/L   Acid-Base Excess 3.7 (H) 0.0 - 2.0 mmol/L   O2 Saturation 93.8 %   Patient temperature 37.0    Collection site RIGHT RADIAL    Drawn by [phone]    Sample type ARTERIAL DRAW    Allens test (pass/fail) PASS PASS  Glucose, capillary     Status: Abnormal   Collection Time: 02/23/16 11:08 AM  Result Value Ref Range   Glucose-Capillary 212 (H) 65 - 99 mg/dL  Glucose, capillary     Status: Abnormal   Collection Time: 02/23/16  4:26 PM  Result Value Ref Range   Glucose-Capillary 209 (H) 65 - 99 mg/dL  Glucose, capillary     Status: Abnormal   Collection Time: 02/23/16 11:00 PM  Result Value Ref Range   Glucose-Capillary 256 (H) 65 - 99 mg/dL   Comment 1 Notify RN   Glucose, capillary     Status: Abnormal   Collection Time: 02/24/16  4:06 AM  Result Value Ref Range   Glucose-Capillary 213 (H) 65 - 99 mg/dL   Comment 1 Notify RN   CBC with Differential/Platelet     Status: Abnormal   Collection Time: 02/24/16  4:21 AM  Result Value Ref Range   WBC 19.6 (H) 4.0 - 10.5 K/uL   RBC 3.34 (L) 4.22 - 5.81 MIL/uL   Hemoglobin 9.9 (L) 13.0 - 17.0 g/dL   HCT 32.2 (L) 39.0 -  52.0 %   MCV 96.4 78.0 - 100.0 fL   MCH 29.6 26.0 - 34.0 pg   MCHC 30.7 30.0 - 36.0 g/dL   RDW 13.4 11.5 - 15.5 %   Platelets 270 150 - 400 K/uL   Neutrophils Relative % 82 %   Lymphocytes Relative 8 %   Monocytes Relative 9 %   Eosinophils Relative 0 %   Basophils Relative 1 %   Neutro Abs 16.0 (H) 1.7 - 7.7 K/uL   Lymphs Abs 1.6 0.7 - 4.0 K/uL   Monocytes Absolute 1.8 (H) 0.1 - 1.0 K/uL   Eosinophils Absolute 0.0 0.0 - 0.7 K/uL   Basophils Absolute 0.2 (H) 0.0 - 0.1 K/uL   WBC Morphology MILD LEFT SHIFT (1-5% METAS, OCC MYELO, OCC BANDS)     Comment: ATYPICAL LYMPHOCYTES  Basic metabolic panel     Status: Abnormal   Collection Time: 02/24/16  4:21 AM  Result Value Ref Range   Sodium 150 (H) 135 - 145 mmol/L   Potassium 3.4 (L) 3.5 - 5.1 mmol/L   Chloride 116 (H) 101  - 111 mmol/L   CO2 30 22 - 32 mmol/L   Glucose, Bld 236 (H) 65 - 99 mg/dL   BUN 23 (H) 6 - 20 mg/dL   Creatinine, Ser 0.62 0.61 - 1.24 mg/dL   Calcium 7.9 (L) 8.9 - 10.3 mg/dL   GFR calc non Af Amer >60 >60 mL/min   GFR calc Af Amer >60 >60 mL/min    Comment: (NOTE) The eGFR has been calculated using the CKD EPI equation. This calculation has not been validated in all clinical situations. eGFR's persistently <60 mL/min signify possible Chronic Kidney Disease.    Anion gap 4 (L) 5 - 15  Glucose, capillary     Status: Abnormal   Collection Time: 02/24/16  7:26 AM  Result Value Ref Range   Glucose-Capillary 196 (H) 65 - 99 mg/dL  Glucose, capillary     Status: Abnormal   Collection Time: 02/24/16 11:16 AM  Result Value Ref Range   Glucose-Capillary 234 (H) 65 - 99 mg/dL  Glucose, capillary     Status: Abnormal   Collection Time: 02/24/16  4:17 PM  Result Value Ref Range   Glucose-Capillary 261 (H) 65 - 99 mg/dL  Glucose, capillary     Status: Abnormal   Collection Time: 02/24/16  8:07 PM  Result Value Ref Range   Glucose-Capillary 235 (H) 65 - 99 mg/dL   Comment 1 Notify RN    Comment 2 Document in Chart   Glucose, capillary     Status: Abnormal   Collection Time: 02/25/16 12:09 AM  Result Value Ref Range   Glucose-Capillary 284 (H) 65 - 99 mg/dL  CBC with Differential/Platelet     Status: Abnormal   Collection Time: 02/25/16  4:38 AM  Result Value Ref Range   WBC 16.4 (H) 4.0 - 10.5 K/uL    Comment: WHITE COUNT CONFIRMED ON SMEAR   RBC 3.36 (L) 4.22 - 5.81 MIL/uL   Hemoglobin 10.0 (L) 13.0 - 17.0 g/dL   HCT 31.7 (L) 39.0 - 52.0 %   MCV 94.3 78.0 - 100.0 fL   MCH 29.8 26.0 - 34.0 pg   MCHC 31.5 30.0 - 36.0 g/dL   RDW 13.2 11.5 - 15.5 %   Platelets 265 150 - 400 K/uL   Neutrophils Relative % 89 %   Lymphocytes Relative 5 %   Monocytes Relative 6 %   Eosinophils Relative 0 %  Basophils Relative 0 %   Neutro Abs 14.6 (H) 1.7 - 7.7 K/uL   Lymphs Abs 0.8 0.7 - 4.0  K/uL   Monocytes Absolute 1.0 0.1 - 1.0 K/uL   Eosinophils Absolute 0.0 0.0 - 0.7 K/uL   Basophils Absolute 0.0 0.0 - 0.1 K/uL   WBC Morphology MILD LEFT SHIFT (1-5% METAS, OCC MYELO, OCC BANDS)   Basic metabolic panel     Status: Abnormal   Collection Time: 02/25/16  4:38 AM  Result Value Ref Range   Sodium 146 (H) 135 - 145 mmol/L   Potassium 3.6 3.5 - 5.1 mmol/L   Chloride 111 101 - 111 mmol/L   CO2 32 22 - 32 mmol/L   Glucose, Bld 279 (H) 65 - 99 mg/dL   BUN 18 6 - 20 mg/dL   Creatinine, Ser 0.60 (L) 0.61 - 1.24 mg/dL   Calcium 7.9 (L) 8.9 - 10.3 mg/dL   GFR calc non Af Amer >60 >60 mL/min   GFR calc Af Amer >60 >60 mL/min    Comment: (NOTE) The eGFR has been calculated using the CKD EPI equation. This calculation has not been validated in all clinical situations. eGFR's persistently <60 mL/min signify possible Chronic Kidney Disease.    Anion gap 3 (L) 5 - 15  Glucose, capillary     Status: Abnormal   Collection Time: 02/25/16  7:34 AM  Result Value Ref Range   Glucose-Capillary 254 (H) 65 - 99 mg/dL    ABGS  Recent Labs  02/23/16 1000  PHART 7.390  PO2ART 74.1*  HCO3 27.3   CULTURES Recent Results (from the past 240 hour(s))  Blood culture (routine x 2)     Status: None   Collection Time: 02/18/16  7:36 AM  Result Value Ref Range Status   Specimen Description BLOOD RIGHT HAND  Final   Special Requests   Final    BOTTLES DRAWN AEROBIC AND ANAEROBIC AEB=8CC ANA=6CC   Culture NO GROWTH 5 DAYS  Final   Report Status 02/23/2016 FINAL  Final  Blood culture (routine x 2)     Status: None   Collection Time: 02/18/16  7:42 AM  Result Value Ref Range Status   Specimen Description BLOOD LEFT ANTECUBITAL  Final   Special Requests BOTTLES DRAWN AEROBIC AND ANAEROBIC 6CC  Final   Culture NO GROWTH 5 DAYS  Final   Report Status 02/23/2016 FINAL  Final  MRSA PCR Screening     Status: Abnormal   Collection Time: 02/19/16  4:47 PM  Result Value Ref Range Status   MRSA  by PCR POSITIVE (A) NEGATIVE Final    Comment:        The GeneXpert MRSA Assay (FDA approved for NASAL specimens only), is one component of a comprehensive MRSA colonization surveillance program. It is not intended to diagnose MRSA infection nor to guide or monitor treatment for MRSA infections. CRITICAL RESULT CALLED TO, READ BACK BY AND VERIFIED WITH: PARIS,C AT 2342 ON 02/19/2016 BY WOODS,M    Studies/Results: No results found.  Medications:  Prior to Admission:  Prescriptions Prior to Admission  Medication Sig Dispense Refill Last Dose  . AMITIZA 24 MCG capsule Take 2 capsules by mouth daily.   02/17/2016 at Unknown time  . Aspirin-Salicylamide-Caffeine (BC HEADACHE POWDER PO) Take 1 Package by mouth daily as needed (pain).   02/17/2016 at Unknown time  . atorvastatin (LIPITOR) 10 MG tablet Take 10 mg by mouth daily.    02/17/2016 at Unknown time  .  benzonatate (TESSALON) 200 MG capsule Take 200 mg by mouth 3 (three) times daily as needed for cough.   unknown  . furosemide (LASIX) 40 MG tablet Take 1 tablet (40 mg total) by mouth 2 (two) times daily. (Patient taking differently: Take 40 mg by mouth daily. ) 4 tablet 0 02/17/2016 at Unknown time  . HYDROcodone-acetaminophen (NORCO) 7.5-325 MG tablet Take 1 tablet by mouth every 6 (six) hours as needed for moderate pain (Must last 30 days.Do not drive or operate machinery while taking this medicine.). 180 tablet 0 unknown  . LORazepam (ATIVAN) 2 MG tablet Take 2 mg by mouth at bedtime.   02/17/2016 at Unknown time  . losartan (COZAAR) 50 MG tablet Take 50 mg by mouth daily.   02/17/2016 at Unknown time  . metFORMIN (GLUCOPHAGE) 500 MG tablet Take 1 tablet by mouth 2 (two) times daily.   02/17/2016 at Unknown time  . mirtazapine (REMERON) 15 MG tablet Take 15 mg by mouth at bedtime.    02/17/2016 at Unknown time  . potassium chloride SA (K-DUR,KLOR-CON) 20 MEQ tablet Take 20 mEq by mouth daily.    02/17/2016 at Unknown time  .  pregabalin (LYRICA) 150 MG capsule Take 150 mg by mouth 2 (two) times daily.   02/17/2016 at Unknown time  . traZODone (DESYREL) 50 MG tablet Take 2 tablets by mouth at bedtime.    02/17/2016 at Unknown time   Scheduled: . albuterol  2.5 mg Nebulization QID  . famotidine (PEPCID) IV  20 mg Intravenous Q12H  . heparin  5,000 Units Subcutaneous Q8H  . imipenem-cilastatin  500 mg Intravenous Q6H  . insulin aspart  0-20 Units Subcutaneous TID AC & HS  . insulin glargine  30 Units Subcutaneous Daily  . LORazepam  1 mg Oral BID  . losartan  50 mg Oral Daily  . methylPREDNISolone (SOLU-MEDROL) injection  40 mg Intravenous Q12H  . mirtazapine  15 mg Oral QHS  . pregabalin  150 mg Oral BID  . sodium chloride flush  3 mL Intravenous Q12H  . traZODone  100 mg Oral QHS  . vancomycin  1,250 mg Intravenous Q8H   Continuous: . sodium chloride 75 mL/hr at 02/18/16 1425  . sodium chloride 75 mL/hr at 02/24/16 1500  . fentaNYL infusion INTRAVENOUS 50 mcg/hr (02/23/16 0830)   GQQ:[id number], alum & mag hydroxide-simeth, HYDROcodone-acetaminophen, ondansetron **OR** ondansetron (ZOFRAN) IV, senna-docusate  Assesment: He was admitted with pneumonia and sepsis. He's had altered mental status which is metabolic encephalopathy. He was noted to have pneumatosis intestinalis but he does not have any GI symptoms. His troponin was elevated and this appears to have been from demand ischemia not acute coronary event. He has morbid obesity which is unchanged. He has a pressure injury of his skin which is being treated. He has diabetes which was not controlled at least partially from his severe illness and from steroids. He is generally improving. He is still very weak and will require skilled care facility placement for rehabilitation. Principal Problem:   Cavitary pneumonia Active Problems:   Hemoptysis   Morbid obesity ([place])   Pneumatosis intestinalis   Pressure injury of skin   Type II diabetes mellitus,  uncontrolled ([place])   Sepsis ([place])   Elevated troponin level    Plan: Continue treatments. Continue BiPAP at night. I don't think is quite ready to move To the floor yet but I will transfer to step    LOS: 7 days   Sybella Harnish L 02/25/2016, 8:26 AM

## 2016-02-26 ENCOUNTER — Inpatient Hospital Stay (HOSPITAL_COMMUNITY): Payer: Medicare Other

## 2016-02-26 DIAGNOSIS — G9341 Metabolic encephalopathy: Secondary | ICD-10-CM | POA: Diagnosis present

## 2016-02-26 DIAGNOSIS — J9601 Acute respiratory failure with hypoxia: Secondary | ICD-10-CM | POA: Diagnosis present

## 2016-02-26 LAB — BASIC METABOLIC PANEL
Anion gap: 3 — ABNORMAL LOW (ref 5–15)
BUN: 28 mg/dL — ABNORMAL HIGH (ref 6–20)
CALCIUM: 7.6 mg/dL — AB (ref 8.9–10.3)
CHLORIDE: 112 mmol/L — AB (ref 101–111)
CO2: 31 mmol/L (ref 22–32)
CREATININE: 0.64 mg/dL (ref 0.61–1.24)
Glucose, Bld: 327 mg/dL — ABNORMAL HIGH (ref 65–99)
Potassium: 3.9 mmol/L (ref 3.5–5.1)
SODIUM: 146 mmol/L — AB (ref 135–145)

## 2016-02-26 LAB — CBC WITH DIFFERENTIAL/PLATELET
BASOS ABS: 0 10*3/uL (ref 0.0–0.1)
Basophils Relative: 0 %
Eosinophils Absolute: 0 10*3/uL (ref 0.0–0.7)
Eosinophils Relative: 0 %
HCT: 24.2 % — ABNORMAL LOW (ref 39.0–52.0)
HEMOGLOBIN: 7.9 g/dL — AB (ref 13.0–17.0)
LYMPHS ABS: 0.8 10*3/uL (ref 0.7–4.0)
LYMPHS PCT: 5 %
MCH: 30.9 pg (ref 26.0–34.0)
MCHC: 32.6 g/dL (ref 30.0–36.0)
MCV: 94.5 fL (ref 78.0–100.0)
MONOS PCT: 6 %
Monocytes Absolute: 1 10*3/uL (ref 0.1–1.0)
Neutro Abs: 15 10*3/uL — ABNORMAL HIGH (ref 1.7–7.7)
Neutrophils Relative %: 89 %
Platelets: 241 10*3/uL (ref 150–400)
RBC: 2.56 MIL/uL — AB (ref 4.22–5.81)
RDW: 13.3 % (ref 11.5–15.5)
WBC Morphology: INCREASED
WBC: 16.8 10*3/uL — AB (ref 4.0–10.5)

## 2016-02-26 LAB — GLUCOSE, CAPILLARY
GLUCOSE-CAPILLARY: 321 mg/dL — AB (ref 65–99)
GLUCOSE-CAPILLARY: 263 mg/dL — AB (ref 65–99)
GLUCOSE-CAPILLARY: 216 mg/dL — AB (ref 65–99)
Glucose-Capillary: 156 mg/dL — ABNORMAL HIGH (ref 65–99)

## 2016-02-26 MED ORDER — POTASSIUM CHLORIDE CRYS ER 20 MEQ PO TBCR
20.0000 meq | EXTENDED_RELEASE_TABLET | Freq: Two times a day (BID) | ORAL | Status: DC
  Administered 2016-02-26 – 2016-02-28 (×6): 20 meq via ORAL
  Filled 2016-02-26 (×6): qty 1

## 2016-02-26 MED ORDER — FUROSEMIDE 10 MG/ML IJ SOLN
40.0000 mg | Freq: Two times a day (BID) | INTRAMUSCULAR | Status: DC
  Administered 2016-02-26 – 2016-03-02 (×12): 40 mg via INTRAVENOUS
  Filled 2016-02-26 (×12): qty 4

## 2016-02-26 MED ORDER — ALBUTEROL SULFATE (2.5 MG/3ML) 0.083% IN NEBU
2.5000 mg | INHALATION_SOLUTION | Freq: Three times a day (TID) | RESPIRATORY_TRACT | Status: DC
  Administered 2016-02-26 – 2016-03-06 (×25): 2.5 mg via RESPIRATORY_TRACT
  Filled 2016-02-26 (×26): qty 3

## 2016-02-26 NOTE — NC FL2 (Signed)
[place] LEVEL OF CARE SCREENING TOOL     IDENTIFICATION  Patient Name: Richard Fields Birthdate: 10/15/1940 Sex: male Admission Date (Current Location): 02/18/2016  [facility] and [place] Number:  [employer] and Address:  [place] [address], [place]      Provider Number: [id number]  Attending Physician Name and Address:  Asencion Noble, MD  Relative Name and Phone Number:       Current Level of Care: Hospital Recommended Level of Care: [place] Prior Approval Number:    Date Approved/Denied:   PASRR Number: [id number] ([id number])  Discharge Plan: Home    Current Diagnoses: Patient Active Problem List   Diagnosis Date Noted  . Acute respiratory failure with hypoxia ([place]) 02/26/2016  . Encephalopathy, metabolic 99991111  . Type II diabetes mellitus, uncontrolled ([place]) 02/24/2016  . Sepsis ([place]) 02/24/2016  . Elevated troponin level 02/24/2016  . Pressure injury of skin 02/19/2016  . Cavitary pneumonia 02/18/2016  . Hemoptysis 02/18/2016  . Morbid obesity ([place]) 02/18/2016  . 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 Hypertension 12/02/2015  . Screening 05/21/2012    Orientation RESPIRATION BLADDER Height & Weight     Self  Other (Comment) (HFNC 7L) Incontinent Weight: (!) 323 lb 6.6 oz (146.7 kg) Height:  5\' 11"  (180.3 cm)  BEHAVIORAL SYMPTOMS/MOOD NEUROLOGICAL BOWEL NUTRITION STATUS      Incontinent  (Diet Carb Modified)  AMBULATORY STATUS COMMUNICATION OF NEEDS Skin   Extensive Assist Verbally PU Stage and Appropriate Care (Left buttocks.)   PU Stage 2 Dressing:  (Foam dressing, changed PRN)                   Personal Care Assistance Level of Assistance  Bathing, Dressing, Feeding Bathing Assistance: Maximum assistance Feeding assistance: Limited assistance Dressing Assistance: Maximum assistance     Functional Limitations Info  Sight, Hearing, Speech Sight Info:  Adequate Hearing Info: Adequate Speech Info: Adequate    SPECIAL CARE FACTORS FREQUENCY  OT (By licensed OT), PT (By licensed PT)     PT Frequency: min 5x/week OT Frequency: min 2x/week            Contractures Contractures Info: Not present    Additional Factors Info  Code Status, Allergies, Psychotropic, Isolation Precautions Code Status Info: Full code Allergies Info: Penicillins, Tegretol,  Psychotropic Info: Ativan, Remeron, Desyrel   Isolation Precautions Info: Contact precautions. 02/19/16 mrsa  by pcr     Current Medications (02/26/2016):  This is the current hospital active medication list Current Facility-Administered Medications  Medication Dose Route Frequency Provider Last Rate Last Dose  . 0.9 %  sodium chloride infusion   Intravenous Continuous Oswald Hillock, MD 75 mL/hr at 02/18/16 1425    . 0.9 %  sodium chloride infusion   Intravenous Continuous Sinda Du, MD 75 mL/hr at 02/26/16 0855    . albuterol (PROVENTIL) (2.5 MG/3ML) 0.083% nebulizer solution 2.5 mg  2.5 mg Nebulization Q4H PRN Sinda Du, MD      . albuterol (PROVENTIL) (2.5 MG/3ML) 0.083% nebulizer solution 2.5 mg  2.5 mg Nebulization QID Asencion Noble, MD   2.5 mg at 02/25/16 2022  . alum & mag hydroxide-simeth (MAALOX/MYLANTA) 200-200-20 MG/5ML suspension 30 mL  30 mL Oral TID PRN Erline Hau, MD      . famotidine (PEPCID) IVPB 20 mg premix  20 mg Intravenous Q12H Sinda Du, MD   20 mg at 02/26/16 0900  . fentaNYL (SUBLIMAZE) 2,500  mcg in sodium chloride 0.9 % 250 mL (10 mcg/mL) infusion  10 mcg/hr Intravenous Continuous Sinda Du, MD 5 mL/hr at 02/23/16 0830 50 mcg/hr at 02/23/16 0830  . furosemide (LASIX) injection 40 mg  40 mg Intravenous Q12H Sinda Du, MD   40 mg at 02/26/16 0833  . heparin injection 5,000 Units  5,000 Units Subcutaneous Q8H Estela Leonie Green, MD   5,000 Units at 02/26/16 [phone]  . HYDROcodone-acetaminophen (NORCO/VICODIN) 5-325 MG per  tablet 1 tablet  1 tablet Oral Q6H PRN Oswald Hillock, MD   1 tablet at 02/25/16 1801  . imipenem-cilastatin (PRIMAXIN) 500 mg in sodium chloride 0.9 % 100 mL IVPB  500 mg Intravenous Q6H Estela Leonie Green, MD   500 mg at 02/26/16 0900  . insulin aspart (novoLOG) injection 0-20 Units  0-20 Units Subcutaneous TID AC & HS Sinda Du, MD   20 Units at 02/26/16 0815  . insulin glargine (LANTUS) injection 30 Units  30 Units Subcutaneous Daily Sinda Du, MD   30 Units at 02/26/16 0900  . LORazepam (ATIVAN) tablet 1 mg  1 mg Oral BID Asencion Noble, MD   1 mg at 02/26/16 0900  . losartan (COZAAR) tablet 50 mg  50 mg Oral Daily Erline Hau, MD   50 mg at 02/26/16 0900  . methylPREDNISolone sodium succinate (SOLU-MEDROL) 40 mg/mL injection 40 mg  40 mg Intravenous Q12H Sinda Du, MD   40 mg at 02/26/16 0834  . mirtazapine (REMERON) tablet 15 mg  15 mg Oral QHS Erline Hau, MD   15 mg at 02/25/16 2140  . ondansetron (ZOFRAN) tablet 4 mg  4 mg Oral Q6H PRN Erline Hau, MD       Or  . ondansetron [facility]) injection 4 mg  4 mg Intravenous Q6H PRN Erline Hau, MD      . potassium chloride SA (K-DUR,KLOR-CON) CR tablet 20 mEq  20 mEq Oral BID Sinda Du, MD   20 mEq at 02/26/16 0900  . pregabalin (LYRICA) capsule 150 mg  150 mg Oral BID Erline Hau, MD   150 mg at 02/26/16 0900  . senna-docusate (Senokot-S) tablet 1 tablet  1 tablet Oral QHS PRN Erline Hau, MD      . sodium chloride flush (NS) 0.9 % injection 3 mL  3 mL Intravenous Q12H Erline Hau, MD   3 mL at 02/25/16 2140  . traZODone (DESYREL) tablet 100 mg  100 mg Oral QHS Erline Hau, MD   100 mg at 02/25/16 2140  . vancomycin (VANCOCIN) 1,250 mg in sodium chloride 0.9 % 250 mL IVPB  1,250 mg Intravenous Q8H Asencion Noble, MD   1,250 mg at 02/26/16 0908     Discharge Medications: Please see discharge summary for a list of  discharge medications.  Relevant Imaging Results:  Relevant Lab Results:   Additional Information SSN [id number] Clydene Pugh, LCSW

## 2016-02-26 NOTE — Progress Notes (Signed)
Subjective: He was on BiPAP again last night. His heart rate has gone up some. No complaints. He denies any chest pain. He says his breathing is a little better. He is still very weak. He is still short of breath with minimal exertion. He is coughing now mostly nonproductively  Objective: Vital signs in last 24 hours: Temp:  [97.9 F (36.6 C)-100.7 F (38.2 C)] 100.7 F (38.2 C) (10/27 0400) Pulse Rate:  [100-132] 117 (10/27 0248) Resp:  [19-42] 30 (10/27 0248) BP: (141-198)/(82-114) 152/90 (10/27 0248) SpO2:  [88 %-100 %] 93 % (10/27 0248) Weight:  [146.7 kg (323 lb 6.6 oz)] 146.7 kg (323 lb 6.6 oz) (10/27 0400) Weight change:  Last BM Date: 02/21/16  Intake/Output from previous day: 10/26 0701 - 10/27 0700 In: 2926.3 [P.O.:360; I.V.:1866.3; IV Piggyback:700] Out: 2050 [Urine:2050]  PHYSICAL EXAM General appearance: alert, cooperative and moderate distress Resp: Rhonchi right greater than left. Some rales in the bases bilaterally Cardio: regular rate and rhythm, S1, S2 normal, no murmur, click, rub or gallop GI: soft, non-tender; bowel sounds normal; no masses,  no organomegaly Extremities: He has chronic venous stasis and has some edema of both legs now Mucous membranes are moist. Skin warm and dry  Lab Results:  Results for orders placed or performed during the hospital encounter of 02/18/16 (from the past 48 hour(s))  Glucose, capillary     Status: Abnormal   Collection Time: 02/24/16 11:16 AM  Result Value Ref Range   Glucose-Capillary 234 (H) 65 - 99 mg/dL  Glucose, capillary     Status: Abnormal   Collection Time: 02/24/16  4:17 PM  Result Value Ref Range   Glucose-Capillary 261 (H) 65 - 99 mg/dL  Glucose, capillary     Status: Abnormal   Collection Time: 02/24/16  8:07 PM  Result Value Ref Range   Glucose-Capillary 235 (H) 65 - 99 mg/dL   Comment 1 Notify RN    Comment 2 Document in Chart   Glucose, capillary     Status: Abnormal   Collection Time: 02/25/16  12:09 AM  Result Value Ref Range   Glucose-Capillary 284 (H) 65 - 99 mg/dL  CBC with Differential/Platelet     Status: Abnormal   Collection Time: 02/25/16  4:38 AM  Result Value Ref Range   WBC 16.4 (H) 4.0 - 10.5 K/uL    Comment: WHITE COUNT CONFIRMED ON SMEAR   RBC 3.36 (L) 4.22 - 5.81 MIL/uL   Hemoglobin 10.0 (L) 13.0 - 17.0 g/dL   HCT 31.7 (L) 39.0 - 52.0 %   MCV 94.3 78.0 - 100.0 fL   MCH 29.8 26.0 - 34.0 pg   MCHC 31.5 30.0 - 36.0 g/dL   RDW 13.2 11.5 - 15.5 %   Platelets 265 150 - 400 K/uL   Neutrophils Relative % 89 %   Lymphocytes Relative 5 %   Monocytes Relative 6 %   Eosinophils Relative 0 %   Basophils Relative 0 %   Neutro Abs 14.6 (H) 1.7 - 7.7 K/uL   Lymphs Abs 0.8 0.7 - 4.0 K/uL   Monocytes Absolute 1.0 0.1 - 1.0 K/uL   Eosinophils Absolute 0.0 0.0 - 0.7 K/uL   Basophils Absolute 0.0 0.0 - 0.1 K/uL   WBC Morphology MILD LEFT SHIFT (1-5% METAS, OCC MYELO, OCC BANDS)   Basic metabolic panel     Status: Abnormal   Collection Time: 02/25/16  4:38 AM  Result Value Ref Range   Sodium 146 (H) 135 - 145  mmol/L   Potassium 3.6 3.5 - 5.1 mmol/L   Chloride 111 101 - 111 mmol/L   CO2 32 22 - 32 mmol/L   Glucose, Bld 279 (H) 65 - 99 mg/dL   BUN 18 6 - 20 mg/dL   Creatinine, Ser 0.60 (L) 0.61 - 1.24 mg/dL   Calcium 7.9 (L) 8.9 - 10.3 mg/dL   GFR calc non Af Amer >60 >60 mL/min   GFR calc Af Amer >60 >60 mL/min    Comment: (NOTE) The eGFR has been calculated using the CKD EPI equation. This calculation has not been validated in all clinical situations. eGFR's persistently <60 mL/min signify possible Chronic Kidney Disease.    Anion gap 3 (L) 5 - 15  Glucose, capillary     Status: Abnormal   Collection Time: 02/25/16  7:34 AM  Result Value Ref Range   Glucose-Capillary 254 (H) 65 - 99 mg/dL  Glucose, capillary     Status: Abnormal   Collection Time: 02/25/16 11:48 AM  Result Value Ref Range   Glucose-Capillary 293 (H) 65 - 99 mg/dL  Glucose, capillary      Status: Abnormal   Collection Time: 02/25/16  4:49 PM  Result Value Ref Range   Glucose-Capillary 286 (H) 65 - 99 mg/dL  Glucose, capillary     Status: Abnormal   Collection Time: 02/25/16  9:45 PM  Result Value Ref Range   Glucose-Capillary 274 (H) 65 - 99 mg/dL   Comment 1 Notify RN    Comment 2 Document in Chart   CBC with Differential/Platelet     Status: Abnormal   Collection Time: 02/26/16  5:34 AM  Result Value Ref Range   WBC 16.8 (H) 4.0 - 10.5 K/uL   RBC 2.56 (L) 4.22 - 5.81 MIL/uL   Hemoglobin 7.9 (L) 13.0 - 17.0 g/dL    Comment: DELTA CHECK NOTED   HCT 24.2 (L) 39.0 - 52.0 %   MCV 94.5 78.0 - 100.0 fL   MCH 30.9 26.0 - 34.0 pg   MCHC 32.6 30.0 - 36.0 g/dL   RDW 13.3 11.5 - 15.5 %   Platelets 241 150 - 400 K/uL   Neutrophils Relative % 89 %   Lymphocytes Relative 5 %   Monocytes Relative 6 %   Eosinophils Relative 0 %   Basophils Relative 0 %   Neutro Abs 15.0 (H) 1.7 - 7.7 K/uL   Lymphs Abs 0.8 0.7 - 4.0 K/uL   Monocytes Absolute 1.0 0.1 - 1.0 K/uL   Eosinophils Absolute 0.0 0.0 - 0.7 K/uL   Basophils Absolute 0.0 0.0 - 0.1 K/uL   RBC Morphology POLYCHROMASIA PRESENT    WBC Morphology INCREASED BANDS (>20% BANDS)     Comment: MILD LEFT SHIFT (1-5% METAS, OCC MYELO, OCC BANDS)  Basic metabolic panel     Status: Abnormal   Collection Time: 02/26/16  5:34 AM  Result Value Ref Range   Sodium 146 (H) 135 - 145 mmol/L   Potassium 3.9 3.5 - 5.1 mmol/L   Chloride 112 (H) 101 - 111 mmol/L   CO2 31 22 - 32 mmol/L   Glucose, Bld 327 (H) 65 - 99 mg/dL   BUN 28 (H) 6 - 20 mg/dL   Creatinine, Ser 0.64 0.61 - 1.24 mg/dL   Calcium 7.6 (L) 8.9 - 10.3 mg/dL   GFR calc non Af Amer >60 >60 mL/min   GFR calc Af Amer >60 >60 mL/min    Comment: (NOTE) The eGFR has been calculated using  the CKD EPI equation. This calculation has not been validated in all clinical situations. eGFR's persistently <60 mL/min signify possible Chronic Kidney Disease.    Anion gap 3 (L) 5 - 15   Glucose, capillary     Status: Abnormal   Collection Time: 02/26/16  7:17 AM  Result Value Ref Range   Glucose-Capillary 321 (H) 65 - 99 mg/dL    ABGS  Recent Labs  47/39/42 1000  PHART 7.390  PO2ART 74.1*  HCO3 27.3   CULTURES Recent Results (from the past 240 hour(s))  Blood culture (routine x 2)     Status: None   Collection Time: 02/18/16  7:36 AM  Result Value Ref Range Status   Specimen Description BLOOD RIGHT HAND  Final   Special Requests   Final    BOTTLES DRAWN AEROBIC AND ANAEROBIC AEB=8CC ANA=6CC   Culture NO GROWTH 5 DAYS  Final   Report Status 02/23/2016 FINAL  Final  Blood culture (routine x 2)     Status: None   Collection Time: 02/18/16  7:42 AM  Result Value Ref Range Status   Specimen Description BLOOD LEFT ANTECUBITAL  Final   Special Requests BOTTLES DRAWN AEROBIC AND ANAEROBIC 6CC  Final   Culture NO GROWTH 5 DAYS  Final   Report Status 02/23/2016 FINAL  Final  MRSA PCR Screening     Status: Abnormal   Collection Time: 02/19/16  4:47 PM  Result Value Ref Range Status   MRSA by PCR POSITIVE (A) NEGATIVE Final    Comment:        The GeneXpert MRSA Assay (FDA approved for NASAL specimens only), is one component of a comprehensive MRSA colonization surveillance program. It is not intended to diagnose MRSA infection nor to guide or monitor treatment for MRSA infections. CRITICAL RESULT CALLED TO, READ BACK BY AND VERIFIED WITH: PARIS,C AT 2342 ON 02/19/2016 BY WOODS,M    Studies/Results: No results found.  Medications:  Prior to Admission:  Prescriptions Prior to Admission  Medication Sig Dispense Refill Last Dose  . AMITIZA 24 MCG capsule Take 2 capsules by mouth daily.   02/17/2016 at Unknown time  . Aspirin-Salicylamide-Caffeine (BC HEADACHE POWDER PO) Take 1 Package by mouth daily as needed (pain).   02/17/2016 at Unknown time  . atorvastatin (LIPITOR) 10 MG tablet Take 10 mg by mouth daily.    02/17/2016 at Unknown time  .  benzonatate (TESSALON) 200 MG capsule Take 200 mg by mouth 3 (three) times daily as needed for cough.   unknown  . furosemide (LASIX) 40 MG tablet Take 1 tablet (40 mg total) by mouth 2 (two) times daily. (Patient taking differently: Take 40 mg by mouth daily. ) 4 tablet 0 02/17/2016 at Unknown time  . HYDROcodone-acetaminophen (NORCO) 7.5-325 MG tablet Take 1 tablet by mouth every 6 (six) hours as needed for moderate pain (Must last 30 days.Do not drive or operate machinery while taking this medicine.). 180 tablet 0 unknown  . LORazepam (ATIVAN) 2 MG tablet Take 2 mg by mouth at bedtime.   02/17/2016 at Unknown time  . losartan (COZAAR) 50 MG tablet Take 50 mg by mouth daily.   02/17/2016 at Unknown time  . metFORMIN (GLUCOPHAGE) 500 MG tablet Take 1 tablet by mouth 2 (two) times daily.   02/17/2016 at Unknown time  . mirtazapine (REMERON) 15 MG tablet Take 15 mg by mouth at bedtime.    02/17/2016 at Unknown time  . potassium chloride SA (K-DUR,KLOR-CON) 20 MEQ tablet Take  20 mEq by mouth daily.    02/17/2016 at Unknown time  . pregabalin (LYRICA) 150 MG capsule Take 150 mg by mouth 2 (two) times daily.   02/17/2016 at Unknown time  . traZODone (DESYREL) 50 MG tablet Take 2 tablets by mouth at bedtime.    02/17/2016 at Unknown time   Scheduled: . albuterol  2.5 mg Nebulization QID  . famotidine (PEPCID) IV  20 mg Intravenous Q12H  . furosemide  40 mg Intravenous Q12H  . heparin  5,000 Units Subcutaneous Q8H  . imipenem-cilastatin  500 mg Intravenous Q6H  . insulin aspart  0-20 Units Subcutaneous TID AC & HS  . insulin glargine  30 Units Subcutaneous Daily  . LORazepam  1 mg Oral BID  . losartan  50 mg Oral Daily  . methylPREDNISolone (SOLU-MEDROL) injection  40 mg Intravenous Q12H  . mirtazapine  15 mg Oral QHS  . potassium chloride  20 mEq Oral BID  . pregabalin  150 mg Oral BID  . sodium chloride flush  3 mL Intravenous Q12H  . traZODone  100 mg Oral QHS  . vancomycin  1,250 mg  Intravenous Q8H   Continuous: . sodium chloride 75 mL/hr at 02/18/16 1425  . sodium chloride 75 mL/hr at 02/25/16 1553  . fentaNYL infusion INTRAVENOUS 50 mcg/hr (02/23/16 0830)   YQI:[id number], alum & mag hydroxide-simeth, HYDROcodone-acetaminophen, ondansetron **OR** ondansetron (ZOFRAN) IV, senna-docusate  Assesment: He was admitted with cavitary pneumonia. He continued having respiratory distress and eventually was intubated and placed on mechanical ventilation. He was extubated 48 hours ago. He has still used BiPAP at night. This morning his pulse ox is off so we will reestablish that. His breathing seems okay but his heart rate has gone up. Although his intake and output is about where we want it for the last 48 hours he is up about 15 L total since admission because of fluid resuscitation for sepsis.  His sepsis seems to have resolved  His pneumonia is improving but he'll have a chest x-ray today both to assess the pneumonia and to see if he has evidence of pulmonary edema  He has morbid obesity unchanged  His pneumatosis intestinalis is asymptomatic and no further investigation is planned  He had significant metabolic encephalopathy which is markedly improved  He had elevated troponin level felt to be related to demand ischemia  He is very weak and plans are for him to go to a skilled care facility when he is ready for discharge Principal Problem:   Cavitary pneumonia Active Problems:   Hemoptysis   Morbid obesity ([place])   Pneumatosis intestinalis   Pressure injury of skin   Type II diabetes mellitus, uncontrolled ([place])   Sepsis ([place])   Elevated troponin level    Plan: Add Lasix presumptively considering his positive fluid status. And potassium. Continue off BiPAP assuming his oxygenation remains okay. Continue IV antibiotics and steroids. Continue working with physical therapy. He is not ready for moving from the stepdown unit    LOS: 8 days   Richard Fields  L 02/26/2016, 7:36 AM

## 2016-02-26 NOTE — Care Management Important Message (Signed)
Important Message  Patient Details  Name: Richard Fields MRN: [id number] Date of Birth: Jun 18, 1940   Medicare Important Message Given:  Yes    Sherald Barge, RN 02/26/2016, 9:02 AM

## 2016-02-26 NOTE — Progress Notes (Signed)
Physical Therapy Treatment Patient Details Name: KASSON MULHERN MRN: [id number] DOB: Jan 09, 1941 Today's Date: 02/26/2016    History of Present Illness 75 y.o. male with a past medical history significant for DM, HLD, HTN, and peripheral neuropathy, who presents by ambulance with complaints of blackish/brownish hemoptysis that onset 1 month ago. Very poor historian. H/o mainly obtained by caregiver and wife at bedside. Per wife he has associated right shoulder pain and profuse diaphoresis overnight. He denies any fever or chills. While in the ED, he was started on vancomycin and admitted for further evaluation of cavitary pneumonia.  CT Chest: RLL ATX surrounding a non-enhancing cavitary lesion, likely a cavitary PNA. CTA Abdomen: pneumatosis of the transverse colon and a segment of the descending colon    PT Comments    Pt received in bed, nursing staff present to finish getting pt cleaned and bathed.  Pt continues to be lethargic, but opens eyes to commands and continues with intermittent following of commands.  Pt was able to be transferred bed<>chair via maxi sky hoyer lift, and then participated in chair exercises.  This continues to be limited due to lethargy.  Pt left up in the chair with RN aware, and sling in position under the pt to be able to get him BTB safely.  Will continue to progress as he is able.      Follow Up Recommendations  SNF     Equipment Recommendations  None recommended by PT    Recommendations for Other Services       Precautions / Restrictions Precautions Precautions: Fall Precaution Comments: Due to current immobility Restrictions Weight Bearing Restrictions: No    Mobility  Bed Mobility Overal bed mobility: +2 for physical assistance;Needs Assistance Bed Mobility: Rolling (To get hoyer sling positioned)              Transfers Overall transfer level: Needs assistance               General transfer comment: Pt transfered bed to chair  via maxi sky lift   Ambulation/Gait                 Stairs            Wheelchair Mobility    Modified Rankin (Stroke Patients Only)       Balance                                    Cognition Arousal/Alertness: Lethargic Behavior During Therapy: WFL for tasks assessed/performed Overall Cognitive Status: Difficult to assess                      Exercises General Exercises - Lower Extremity Long Arc Quad: AAROM;Strengthening;Both (x8 reps with constant cues for exercise.  )    General Comments        Pertinent Vitals/Pain Pain Assessment: No/denies pain    Home Living                      Prior Function            PT Goals (current goals can now be found in the care plan section) Acute Rehab PT Goals Patient Stated Goal: none stated PT Goal Formulation: Patient unable to participate in goal setting Time For Goal Achievement: 03/09/16 Potential to Achieve Goals: Fair    Frequency    Min 5X/week  PT Plan Current plan remains appropriate    Co-evaluation             End of Session Equipment Utilized During Treatment: Other (comment) (maxi sky)   Patient left: in chair;with call bell/phone within reach     Time: 0822-0850 PT Time Calculation (min) (ACUTE ONLY): 28 min  Charges:  $Therapeutic Activity Peds: 8-22 mins                    G Codes:      Beth Spencer Peterkin, PT, DPT X: [phone]

## 2016-02-26 NOTE — Evaluation (Addendum)
Occupational Therapy Evaluation Patient Details Name: Richard Fields MRN: [id number] DOB: July 09, 1940 Today's Date: 02/26/2016    History of Present Illness 75 y.o. male with a past medical history significant for DM, HLD, HTN, and peripheral neuropathy, who presents by ambulance with complaints of blackish/brownish hemoptysis that onset 1 month ago. Very poor historian. H/o mainly obtained by caregiver and wife at bedside. Per wife he has associated right shoulder pain and profuse diaphoresis overnight. He denies any fever or chills. While in the ED, he was started on vancomycin and admitted for further evaluation of cavitary pneumonia.  CT Chest: RLL ATX surrounding a non-enhancing cavitary lesion, likely a cavitary PNA. CTA Abdomen: pneumatosis of the transverse colon and a segment of the descending colon   Clinical Impression   OT evaluation/PT treatment completed this am, pt lethargic, able to follow commands inconsistently. Pt currently requires total care for ADL and functional mobility tasks. During session, +2 assist for rolling, hoyer lift utilized for bed to chair transfer. Recommend SNF on discharge to improve strength, activity tolerance, independence and safety during ADL and functional mobility tasks.     Follow Up Recommendations  SNF    Equipment Recommendations  None recommended by OT       Precautions / Restrictions Precautions Precautions: Fall Precaution Comments: Due to current immobility Restrictions Weight Bearing Restrictions: No      Mobility Bed Mobility Overal bed mobility: +2 for physical assistance;Needs Assistance Bed Mobility: Rolling              Transfers Overall transfer level: Needs assistance               General transfer comment: Pt transfered bed to chair via hoyer lift          ADL Overall ADL's : Needs assistance/impaired                                       General ADL Comments: pt is currently  total care for all ADL tasks               Pertinent Vitals/Pain Pain Assessment: No/denies pain     Hand Dominance  (unknown)   Extremity/Trunk Assessment Upper Extremity Assessment Upper Extremity Assessment: Generalized weakness   Lower Extremity Assessment Lower Extremity Assessment: Defer to PT evaluation       Communication Communication Communication: Expressive difficulties (level of alertness)   Cognition Arousal/Alertness: Lethargic Behavior During Therapy: WFL for tasks assessed/performed Overall Cognitive Status: Difficult to assess (due to level of arousal )                                Home Living Family/patient expects to be discharged to:: Skilled nursing facility                                        Prior Functioning/Environment          Comments: unknown, pt unable to answer questions due to consciousness level        OT Problem List: Decreased strength;Decreased activity tolerance;Impaired balance (sitting and/or standing);Decreased safety awareness;Decreased coordination;Decreased cognition;Impaired UE functional use   OT Treatment/Interventions: Self-care/ADL training;Therapeutic exercise;Energy conservation;DME and/or AE instruction;Manual therapy;Therapeutic activities;Patient/family education    OT Goals(Current goals  can be found in the care plan section) Acute Rehab OT Goals Patient Stated Goal: none stated OT Goal Formulation: Patient unable to participate in goal setting Time For Goal Achievement: 03/11/16 Potential to Achieve Goals: Good ADL Goals Pt Will Perform Grooming: with mod assist;sitting Pt Will Transfer to Toilet: with mod assist;bedside commode;stand pivot transfer Pt/caregiver will Perform Home Exercise Program: Increased strength;Both right and left upper extremity;With Supervision;With written HEP provided  OT Frequency: Min 2X/week    End of Session    Activity Tolerance: Patient  limited by lethargy Patient left: in chair;with call bell/phone within reach   Time: 0822-0850 OT Time Calculation (min): 28 min Charges:  OT General Charges $OT Visit: 1 Procedure OT Evaluation $OT Eval Moderate Complexity: 1 Procedure Guadelupe Sabin, OTR/L  [phone] 02/26/2016, 9:53 AM

## 2016-02-26 NOTE — Clinical Social Work Note (Signed)
Clinical Social Work Assessment  Patient Details  Name: Richard Fields MRN: [id number] Date of Birth: 19-Nov-1940  Date of referral:  02/26/16               Reason for consult:  Facility Placement, Discharge Planning                Permission sought to share information with:    Permission granted to share information::     Name::        Agency::     Relationship::     Contact Information:  Miracle Narasimhan, wife; Braelen Fruin, caregiver.  Housing/Transportation Living arrangements for the past 2 months:  Single Family Home, [place] of Information:  Spouse, Other (Comment Required) Building surveyor, caregiver) Patient Interpreter Needed:  None Criminal Activity/Legal Involvement Pertinent to Current Situation/Hospitalization:  No - Comment as needed Significant Relationships:  Spouse, Other(Comment) Building surveyor, Building control surveyor) Lives with:  Spouse Do you feel safe going back to the place where you live?  Yes Need for family participation in patient care:  Yes (Comment)  Care giving concerns:  Mrs. Kuhlman indicates that she is unable to care for patient if he cannot walk.    Social Worker assessment / plan:  Patient resides in the home with his wife. At baseline, he could ambulate with a walker and by guiding himself with the furniture. He was independent in his ADLs. Mrs .Neiswender stated that that patient placement as she cannot take care of him if he cannot walk. Gwinda Passe and Mrs. Sommerfield discussed facility placement options.  They discussed interest in [facility] and [facility].   Employment status:  Retired Nurse, adult PT Recommendations:  [place] / Referral to community resources:  [employer]  Patient/Family's Response to care:  Spouse is agreeable to placement.  Patient/Family's Understanding of and Emotional Response to Diagnosis, Current Treatment, and Prognosis:  Caregiver and  spouse have understanding of diagnosis, treatment and prognosis.   Emotional Assessment Appearance:  Appears stated age Attitude/Demeanor/Rapport:    Affect (typically observed):  Unable to Assess Orientation:  Oriented to Place Alcohol / Substance use:  Tobacco Use Psych involvement (Current and /or in the community):  No (Comment)  Discharge Needs  Concerns to be addressed:  Discharge Planning Concerns Readmission within the last 30 days:  No Current discharge risk:  Chronically ill, Lack of support system Barriers to Discharge:  No Barriers Identified   Ihor Gully, LCSW 02/26/2016, 10:37 AM

## 2016-02-26 NOTE — Progress Notes (Signed)
Inpatient Diabetes Program Recommendations  AACE/ADA: New Consensus Statement on Inpatient Glycemic Control (2015)  Target Ranges:  Prepandial:   less than 140 mg/dL      Peak postprandial:   less than 180 mg/dL (1-2 hours)      Critically ill patients:  140 - 180 mg/dL  Results for Richard Fields, Richard Fields (MRN [id number]) as of 02/26/2016 08:06  Ref. Range 02/25/2016 07:34 02/25/2016 11:48 02/25/2016 16:49 02/25/2016 21:45 02/26/2016 07:17  Glucose-Capillary Latest Ref Range: 65 - 99 mg/dL 254 (H) 293 (H) 286 (H) 274 (H) 321 (H)    Review of Glycemic Control  Diabetes history:DM2 Outpatient Diabetes medications: Metformin 500 mg BID Current orders for Inpatient glycemic control: Lantus 30 units daily, Novolog 0-20 units ACHS  Inpatient Diabetes Program Recommendations: Insulin - Basal: If steroids are continued, please consider increasing Lantus to 35 units daily. Insulin - Meal Coverage: If steroids are continued, please consider ordering Novolog 5 units TID with meals for meal coverage if patient eats at least 50% of meal.  Thanks, Barnie Alderman, RN, MSN, CDE Diabetes Coordinator Inpatient Diabetes Program [phone] (Team Pager from 8am to [place])

## 2016-02-26 NOTE — Clinical Social Work Placement (Signed)
   CLINICAL SOCIAL WORK PLACEMENT  NOTE  Date:  02/26/2016  Patient Details  Name: Richard Fields MRN: [id number] Date of Birth: 23-Sep-1940  Clinical Social Work is seeking post-discharge placement for this patient at the [place] level of care (*CSW will initial, date and re-position this form in  chart as items are completed):  Yes   Patient/family provided with [facility] Work Department's list of facilities offering this level of care within the geographic area requested by the patient (or if unable, by the patient's family).  Yes   Patient/family informed of their freedom to choose among providers that offer the needed level of care, that participate in Medicare, Medicaid or managed care program needed by the patient, have an available bed and are willing to accept the patient.  Yes   Patient/family informed of [facility]'s ownership interest in [facility] and [facility], as well as of the fact that they are under no obligation to receive care at these facilities.  PASRR submitted to EDS on 02/26/16     PASRR number received on 02/26/16     Existing PASRR number confirmed on       FL2 transmitted to all facilities in geographic area requested by pt/family on 02/26/16     FL2 transmitted to all facilities within larger geographic area on       Patient informed that his/her managed care company has contracts with or will negotiate with certain facilities, including the following:            Patient/family informed of bed offers received.  Patient chooses bed at       Physician recommends and patient chooses bed at      Patient to be transferred to   on  .  Patient to be transferred to facility by       Patient family notified on   of transfer.  Name of family member notified:        PHYSICIAN       Additional Comment:    _______________________________________________ Ihor Gully, LCSW 02/26/2016, 10:53  AM

## 2016-02-26 NOTE — Progress Notes (Signed)
Pharmacy Antibiotic Note  Richard Fields is a 75 y.o. male who is morbidly obese, admitted on 02/18/2016 with pneumonia.  Pharmacy has been consulted for VANCOMYCIN and PRIMAXIN dosing.   Vanc trough 22mcg/ml when checked  Plan: Cont Vancomycin 1250mg  IV q8h (goal trough 15-20) Continue Primaxin 500mg  IV q6hrs Monitor labs, renal fxn, progress, c/s  Height: 5\' 11"  (180.3 cm) Weight: (!) 323 lb 6.6 oz (146.7 kg) IBW/kg (Calculated) : 75.3  Temp (24hrs), Avg:99.3 F (37.4 C), Min:97.9 F (36.6 C), Max:100.7 F (38.2 C)   Recent Labs Lab 02/20/16 0947 02/21/16 0656 02/22/16 0548 02/23/16 0434 02/23/16 0802 02/24/16 0421 02/25/16 0438 02/26/16 0534  WBC  --  20.5* 13.3* 12.9*  --  19.6* 16.4* 16.8*  CREATININE  --  0.76 0.71 0.71  --  0.62 0.60* 0.64  LATICACIDVEN 2.1*  --   --   --   --   --   --   --   VANCOTROUGH  --  14*  --   --  17  --   --   --     Estimated Creatinine Clearance: 113.6 mL/min (by C-G formula based on SCr of 0.64 mg/dL).    Allergies  Allergen Reactions  . Penicillins Other (See Comments)    Unknown-reaction is unknown  . 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  Hart Robinsons, PharmD Clinical Pharmacist Pager:  [phone] 02/26/2016   02/26/2016 10:25 AM

## 2016-02-27 ENCOUNTER — Inpatient Hospital Stay (HOSPITAL_COMMUNITY): Payer: Medicare Other

## 2016-02-27 LAB — CBC WITH DIFFERENTIAL/PLATELET
BASOS PCT: 0 %
Basophils Absolute: 0 10*3/uL (ref 0.0–0.1)
EOS ABS: 0 10*3/uL (ref 0.0–0.7)
EOS PCT: 0 %
HCT: 23 % — ABNORMAL LOW (ref 39.0–52.0)
Hemoglobin: 7.4 g/dL — ABNORMAL LOW (ref 13.0–17.0)
LYMPHS ABS: 1 10*3/uL (ref 0.7–4.0)
Lymphocytes Relative: 5 %
MCH: 30.6 pg (ref 26.0–34.0)
MCHC: 32.2 g/dL (ref 30.0–36.0)
MCV: 95 fL (ref 78.0–100.0)
MONOS PCT: 4 %
Monocytes Absolute: 0.8 10*3/uL (ref 0.1–1.0)
Neutro Abs: 18.7 10*3/uL — ABNORMAL HIGH (ref 1.7–7.7)
Neutrophils Relative %: 91 %
PLATELETS: 239 10*3/uL (ref 150–400)
RBC: 2.42 MIL/uL — ABNORMAL LOW (ref 4.22–5.81)
RDW: 13.4 % (ref 11.5–15.5)
WBC: 20.6 10*3/uL — ABNORMAL HIGH (ref 4.0–10.5)

## 2016-02-27 LAB — BASIC METABOLIC PANEL
Anion gap: 3 — ABNORMAL LOW (ref 5–15)
BUN: 24 mg/dL — AB (ref 6–20)
CALCIUM: 7.6 mg/dL — AB (ref 8.9–10.3)
CHLORIDE: 111 mmol/L (ref 101–111)
CO2: 33 mmol/L — AB (ref 22–32)
CREATININE: 0.58 mg/dL — AB (ref 0.61–1.24)
GFR calc non Af Amer: >60 mL/min (ref >60)
Glucose, Bld: 246 mg/dL — ABNORMAL HIGH (ref 65–99)
Potassium: 3.7 mmol/L (ref 3.5–5.1)
SODIUM: 147 mmol/L — AB (ref 135–145)

## 2016-02-27 LAB — GLUCOSE, CAPILLARY
GLUCOSE-CAPILLARY: 218 mg/dL — AB (ref 65–99)
GLUCOSE-CAPILLARY: 193 mg/dL — AB (ref 65–99)
Glucose-Capillary: 314 mg/dL — ABNORMAL HIGH (ref 65–99)
Glucose-Capillary: 281 mg/dL — ABNORMAL HIGH (ref 65–99)

## 2016-02-27 NOTE — Progress Notes (Signed)
Subjective: He is still very weak. Clearly he is better subjectively than yesterday. He says his breathing is okay. He is incontinent of urine and I'm trying to diurese him. I know is incomplete because of his incontinence. I think he is going to need another Foley catheter because he staying wet and I'm concerned about skin breakdown plus needing to manage his fluid status. Thoracentesis was not done yesterday due to confusion about the order. He denies any chest pain. He is coughing but he says he is not bringing anything up. His weakness is his main complaint and he is indeed objectively very weak as well. He is able to answer questions. No chest pain. No nausea or vomiting  Objective: Vital signs in last 24 hours: Temp:  [97 F (36.1 C)-99.8 F (37.7 C)] 97 F (36.1 C) (10/27 2000) Pulse Rate:  [87-121] 115 (10/28 0800) Resp:  [0-37] 22 (10/28 0800) BP: (82-121)/(48-92) 119/92 (10/28 0800) SpO2:  [89 %-99 %] 96 % (10/28 0846) Weight change:  Last BM Date: 02/26/16  Intake/Output from previous day: 10/27 0701 - 10/28 0700 In: 3185.5 [I.V.:1835.5; IV Piggyback:1350] Out: 300 [Urine:300]  PHYSICAL EXAM General appearance: alert, cooperative, mild distress and morbidly obese Resp: rhonchi bilaterally Cardio: regular rate and rhythm, S1, S2 normal, no murmur, click, rub or gallop GI: soft, non-tender; bowel sounds normal; no masses,  no organomegaly Extremities: Chronic venous stasis changes are still present. He has generalized puffiness suggesting third spacing of fluid Skin is warm and dry but as mentioned generally puffy. Mucous membranes are moist. Pupils are reactive  Lab Results:  Results for orders placed or performed during the hospital encounter of 02/18/16 (from the past 48 hour(s))  Glucose, capillary     Status: Abnormal   Collection Time: 02/25/16 11:48 AM  Result Value Ref Range   Glucose-Capillary 293 (H) 65 - 99 mg/dL  Glucose, capillary     Status: Abnormal    Collection Time: 02/25/16  4:49 PM  Result Value Ref Range   Glucose-Capillary 286 (H) 65 - 99 mg/dL  Glucose, capillary     Status: Abnormal   Collection Time: 02/25/16  9:45 PM  Result Value Ref Range   Glucose-Capillary 274 (H) 65 - 99 mg/dL   Comment 1 Notify RN    Comment 2 Document in Chart   CBC with Differential/Platelet     Status: Abnormal   Collection Time: 02/26/16  5:34 AM  Result Value Ref Range   WBC 16.8 (H) 4.0 - 10.5 K/uL   RBC 2.56 (L) 4.22 - 5.81 MIL/uL   Hemoglobin 7.9 (L) 13.0 - 17.0 g/dL    Comment: DELTA CHECK NOTED   HCT 24.2 (L) 39.0 - 52.0 %   MCV 94.5 78.0 - 100.0 fL   MCH 30.9 26.0 - 34.0 pg   MCHC 32.6 30.0 - 36.0 g/dL   RDW 13.3 11.5 - 15.5 %   Platelets 241 150 - 400 K/uL   Neutrophils Relative % 89 %   Lymphocytes Relative 5 %   Monocytes Relative 6 %   Eosinophils Relative 0 %   Basophils Relative 0 %   Neutro Abs 15.0 (H) 1.7 - 7.7 K/uL   Lymphs Abs 0.8 0.7 - 4.0 K/uL   Monocytes Absolute 1.0 0.1 - 1.0 K/uL   Eosinophils Absolute 0.0 0.0 - 0.7 K/uL   Basophils Absolute 0.0 0.0 - 0.1 K/uL   RBC Morphology POLYCHROMASIA PRESENT    WBC Morphology INCREASED BANDS (>20% BANDS)  Comment: MILD LEFT SHIFT (1-5% METAS, OCC MYELO, OCC BANDS)  Basic metabolic panel     Status: Abnormal   Collection Time: 02/26/16  5:34 AM  Result Value Ref Range   Sodium 146 (H) 135 - 145 mmol/L   Potassium 3.9 3.5 - 5.1 mmol/L   Chloride 112 (H) 101 - 111 mmol/L   CO2 31 22 - 32 mmol/L   Glucose, Bld 327 (H) 65 - 99 mg/dL   BUN 28 (H) 6 - 20 mg/dL   Creatinine, Ser 0.64 0.61 - 1.24 mg/dL   Calcium 7.6 (L) 8.9 - 10.3 mg/dL   GFR calc non Af Amer >60 >60 mL/min   GFR calc Af Amer >60 >60 mL/min    Comment: (NOTE) The eGFR has been calculated using the CKD EPI equation. This calculation has not been validated in all clinical situations. eGFR's persistently <60 mL/min signify possible Chronic Kidney Disease.    Anion gap 3 (L) 5 - 15  Glucose, capillary      Status: Abnormal   Collection Time: 02/26/16  7:17 AM  Result Value Ref Range   Glucose-Capillary 321 (H) 65 - 99 mg/dL  Glucose, capillary     Status: Abnormal   Collection Time: 02/26/16 11:35 AM  Result Value Ref Range   Glucose-Capillary 263 (H) 65 - 99 mg/dL  Glucose, capillary     Status: Abnormal   Collection Time: 02/26/16  4:18 PM  Result Value Ref Range   Glucose-Capillary 216 (H) 65 - 99 mg/dL   Comment 1 Notify RN   Glucose, capillary     Status: Abnormal   Collection Time: 02/26/16  9:42 PM  Result Value Ref Range   Glucose-Capillary 156 (H) 65 - 99 mg/dL   Comment 1 Notify RN    Comment 2 Document in Chart   Basic metabolic panel     Status: Abnormal   Collection Time: 02/27/16  4:30 AM  Result Value Ref Range   Sodium 147 (H) 135 - 145 mmol/L   Potassium 3.7 3.5 - 5.1 mmol/L   Chloride 111 101 - 111 mmol/L   CO2 33 (H) 22 - 32 mmol/L   Glucose, Bld 246 (H) 65 - 99 mg/dL   BUN 24 (H) 6 - 20 mg/dL   Creatinine, Ser 0.58 (L) 0.61 - 1.24 mg/dL   Calcium 7.6 (L) 8.9 - 10.3 mg/dL   GFR calc non Af Amer >60 >60 mL/min   GFR calc Af Amer >60 >60 mL/min    Comment: (NOTE) The eGFR has been calculated using the CKD EPI equation. This calculation has not been validated in all clinical situations. eGFR's persistently <60 mL/min signify possible Chronic Kidney Disease.    Anion gap 3 (L) 5 - 15  CBC with Differential/Platelet     Status: Abnormal   Collection Time: 02/27/16  4:30 AM  Result Value Ref Range   WBC 20.6 (H) 4.0 - 10.5 K/uL   RBC 2.42 (L) 4.22 - 5.81 MIL/uL   Hemoglobin 7.4 (L) 13.0 - 17.0 g/dL   HCT 23.0 (L) 39.0 - 52.0 %   MCV 95.0 78.0 - 100.0 fL   MCH 30.6 26.0 - 34.0 pg   MCHC 32.2 30.0 - 36.0 g/dL   RDW 13.4 11.5 - 15.5 %   Platelets 239 150 - 400 K/uL   Neutrophils Relative % 91 %   Neutro Abs 18.7 (H) 1.7 - 7.7 K/uL   Lymphocytes Relative 5 %   Lymphs Abs 1.0 0.7 -  4.0 K/uL   Monocytes Relative 4 %   Monocytes Absolute 0.8 0.1 - 1.0  K/uL   Eosinophils Relative 0 %   Eosinophils Absolute 0.0 0.0 - 0.7 K/uL   Basophils Relative 0 %   Basophils Absolute 0.0 0.0 - 0.1 K/uL    ABGS No results for input(s): PHART, PO2ART, TCO2, HCO3 in the last 72 hours.  Invalid input(s): PCO2 CULTURES Recent Results (from the past 240 hour(s))  Blood culture (routine x 2)     Status: None   Collection Time: 02/18/16  7:36 AM  Result Value Ref Range Status   Specimen Description BLOOD RIGHT HAND  Final   Special Requests   Final    BOTTLES DRAWN AEROBIC AND ANAEROBIC AEB=8CC ANA=6CC   Culture NO GROWTH 5 DAYS  Final   Report Status 02/23/2016 FINAL  Final  Blood culture (routine x 2)     Status: None   Collection Time: 02/18/16  7:42 AM  Result Value Ref Range Status   Specimen Description BLOOD LEFT ANTECUBITAL  Final   Special Requests BOTTLES DRAWN AEROBIC AND ANAEROBIC 6CC  Final   Culture NO GROWTH 5 DAYS  Final   Report Status 02/23/2016 FINAL  Final  MRSA PCR Screening     Status: Abnormal   Collection Time: 02/19/16  4:47 PM  Result Value Ref Range Status   MRSA by PCR POSITIVE (A) NEGATIVE Final    Comment:        The GeneXpert MRSA Assay (FDA approved for NASAL specimens only), is one component of a comprehensive MRSA colonization surveillance program. It is not intended to diagnose MRSA infection nor to guide or monitor treatment for MRSA infections. CRITICAL RESULT CALLED TO, READ BACK BY AND VERIFIED WITH: PARIS,C AT 2342 ON 02/19/2016 BY WOODS,M    Studies/Results: Dg Chest Port 1 View  Result Date: 02/26/2016 CLINICAL DATA:  Pt couldn't verbally explain reason for exam. Tech and nurse techs assisted to try and stable pt upright to get sufficient AP view of chest. Reason for exam is pneumonia. EXAM: PORTABLE CHEST 1 VIEW COMPARISON:  02/22/2016 FINDINGS: Opacity at the right lung base has increased consistent with enlargement of the right pleural effusion, now moderate to large in size. No convincing  left pleural effusion. No pneumothorax. No evidence of pulmonary edema.  No definite pneumonia. Cardiac silhouette is enlarged but stable. Left PICC has its tip at the left brachiocephalic vein superior vena cava confluence. IMPRESSION: 1. Increased right pleural effusion, now moderate to large. 2. Convincing pneumonia.  No pulmonary edema. Electronically Signed   By: Amie Portland M.D.   On: 02/26/2016 10:04    Medications:  Prior to Admission:  Prescriptions Prior to Admission  Medication Sig Dispense Refill Last Dose  . AMITIZA 24 MCG capsule Take 2 capsules by mouth daily.   02/17/2016 at Unknown time  . Aspirin-Salicylamide-Caffeine (BC HEADACHE POWDER PO) Take 1 Package by mouth daily as needed (pain).   02/17/2016 at Unknown time  . atorvastatin (LIPITOR) 10 MG tablet Take 10 mg by mouth daily.    02/17/2016 at Unknown time  . benzonatate (TESSALON) 200 MG capsule Take 200 mg by mouth 3 (three) times daily as needed for cough.   unknown  . furosemide (LASIX) 40 MG tablet Take 1 tablet (40 mg total) by mouth 2 (two) times daily. (Patient taking differently: Take 40 mg by mouth daily. ) 4 tablet 0 02/17/2016 at Unknown time  . HYDROcodone-acetaminophen (NORCO) 7.5-325 MG tablet Take  1 tablet by mouth every 6 (six) hours as needed for moderate pain (Must last 30 days.Do not drive or operate machinery while taking this medicine.). 180 tablet 0 unknown  . LORazepam (ATIVAN) 2 MG tablet Take 2 mg by mouth at bedtime.   02/17/2016 at Unknown time  . losartan (COZAAR) 50 MG tablet Take 50 mg by mouth daily.   02/17/2016 at Unknown time  . metFORMIN (GLUCOPHAGE) 500 MG tablet Take 1 tablet by mouth 2 (two) times daily.   02/17/2016 at Unknown time  . mirtazapine (REMERON) 15 MG tablet Take 15 mg by mouth at bedtime.    02/17/2016 at Unknown time  . potassium chloride SA (K-DUR,KLOR-CON) 20 MEQ tablet Take 20 mEq by mouth daily.    02/17/2016 at Unknown time  . pregabalin (LYRICA) 150 MG capsule  Take 150 mg by mouth 2 (two) times daily.   02/17/2016 at Unknown time  . traZODone (DESYREL) 50 MG tablet Take 2 tablets by mouth at bedtime.    02/17/2016 at Unknown time   Scheduled: . albuterol  2.5 mg Nebulization TID  . famotidine (PEPCID) IV  20 mg Intravenous Q12H  . furosemide  40 mg Intravenous Q12H  . heparin  5,000 Units Subcutaneous Q8H  . imipenem-cilastatin  500 mg Intravenous Q6H  . insulin aspart  0-20 Units Subcutaneous TID AC & HS  . insulin glargine  30 Units Subcutaneous Daily  . LORazepam  1 mg Oral BID  . losartan  50 mg Oral Daily  . methylPREDNISolone (SOLU-MEDROL) injection  40 mg Intravenous Q12H  . mirtazapine  15 mg Oral QHS  . potassium chloride  20 mEq Oral BID  . pregabalin  150 mg Oral BID  . sodium chloride flush  3 mL Intravenous Q12H  . traZODone  100 mg Oral QHS  . vancomycin  1,250 mg Intravenous Q8H   Continuous: . sodium chloride 75 mL/hr at 02/18/16 1425  . sodium chloride 75 mL/hr at 02/26/16 0855  . fentaNYL infusion INTRAVENOUS 50 mcg/hr (02/23/16 0830)   ION:[id number], alum & mag hydroxide-simeth, HYDROcodone-acetaminophen, ondansetron **OR** ondansetron (ZOFRAN) IV, senna-docusate  Assesment: He was admitted with cavitary pneumonia. He may have a pleural effusion but when he had ultrasound done on the 20th he did not appear that he had a lot of fluid at that time. Thoracentesis was requested but not done. He had acute respiratory failure with hypoxia and had to be intubated and placed on mechanical ventilation but has been off now for about 4 days. He was volume overloaded from fluid resuscitation for sepsis. His intake and output is incomplete but he is still positive substantially. I suspect he's probably about 2-3 L negative yesterday but that is not able to be documented.  He had acute metabolic encephalopathy and that has resolved  He has severe weakness is really not able to manage any of his activities of daily living without  assistance. He is clearly going to require rehabilitation at discharge. His wife and other caretaker told me in the office last week that they were not able to manage him at home even before all this happened.  He has skin breakdown and with his incontinence of urine I don't think it's safe so I'm going to have a Foley catheter put in  He has morbid obesity which makes it more difficult for him to get around  He has tachycardia probably related to his medical illness.  He had elevated troponin but no evidence of acute coronary syndrome  this is thought to be related to demand ischemia  He is more anemic but not at a point that he needs a blood transfusion. He will need stools for blood  He has diabetes and his blood sugar is generally trending a little bit better.   Principal Problem:   Cavitary pneumonia Active Problems:   Hemoptysis   Morbid obesity ([place])   Pneumatosis intestinalis   Pressure injury of skin   Type II diabetes mellitus, uncontrolled ([facility])   Sepsis ([facility])   Elevated troponin level   Acute respiratory failure with hypoxia ([facility])   Encephalopathy, metabolic    Plan: Continue treatments. Continue diuresis. Continue with physical therapy occupational therapy etc. He still may need thoracentesis. That is not available here today or tomorrow. However he is clinically better so I think we can hold off on that. Plan for that will be to do CT chest on Monday with thoracentesis to follow if he has substantial pleural effusion. Repeat labs tomorrow. Continue trying to get him up. Discussed with his wife by telephone. She has some element of dementia but was writing down her questions and I have answered her questions. She does want him to be full code.    LOS: 9 days   Randel Hargens L 02/27/2016, 9:13 AM

## 2016-02-28 DIAGNOSIS — K922 Gastrointestinal hemorrhage, unspecified: Secondary | ICD-10-CM | POA: Diagnosis not present

## 2016-02-28 DIAGNOSIS — J984 Other disorders of lung: Secondary | ICD-10-CM

## 2016-02-28 DIAGNOSIS — D62 Acute posthemorrhagic anemia: Secondary | ICD-10-CM | POA: Diagnosis not present

## 2016-02-28 DIAGNOSIS — E46 Unspecified protein-calorie malnutrition: Secondary | ICD-10-CM | POA: Diagnosis present

## 2016-02-28 DIAGNOSIS — J189 Pneumonia, unspecified organism: Secondary | ICD-10-CM

## 2016-02-28 LAB — COMPREHENSIVE METABOLIC PANEL
ALT: 19 U/L (ref 17–63)
AST: 21 U/L (ref 15–41)
Albumin: 1.7 g/dL — ABNORMAL LOW (ref 3.5–5.0)
Alkaline Phosphatase: 72 U/L (ref 38–126)
Anion gap: 3 — ABNORMAL LOW (ref 5–15)
BUN: 19 mg/dL (ref 6–20)
CHLORIDE: 104 mmol/L (ref 101–111)
CO2: 34 mmol/L — AB (ref 22–32)
Calcium: 7.2 mg/dL — ABNORMAL LOW (ref 8.9–10.3)
Creatinine, Ser: 0.62 mg/dL (ref 0.61–1.24)
Glucose, Bld: 230 mg/dL — ABNORMAL HIGH (ref 65–99)
POTASSIUM: 3.6 mmol/L (ref 3.5–5.1)
SODIUM: 141 mmol/L (ref 135–145)
Total Bilirubin: 0.5 mg/dL (ref 0.3–1.2)
Total Protein: 4.5 g/dL — ABNORMAL LOW (ref 6.5–8.1)

## 2016-02-28 LAB — GLUCOSE, CAPILLARY
Glucose-Capillary: 219 mg/dL — ABNORMAL HIGH (ref 65–99)
Glucose-Capillary: 280 mg/dL — ABNORMAL HIGH (ref 65–99)
Glucose-Capillary: 225 mg/dL — ABNORMAL HIGH (ref 65–99)
Glucose-Capillary: 240 mg/dL — ABNORMAL HIGH (ref 65–99)

## 2016-02-28 LAB — CBC WITH DIFFERENTIAL/PLATELET
BASOS ABS: 0 10*3/uL (ref 0.0–0.1)
Basophils Relative: 0 %
EOS ABS: 0 10*3/uL (ref 0.0–0.7)
EOS PCT: 0 %
HCT: 19.9 % — ABNORMAL LOW (ref 39.0–52.0)
Hemoglobin: 6.4 g/dL — CL (ref 13.0–17.0)
LYMPHS PCT: 5 %
Lymphs Abs: 0.8 10*3/uL (ref 0.7–4.0)
MCH: 30.5 pg (ref 26.0–34.0)
MCHC: 32.2 g/dL (ref 30.0–36.0)
MCV: 94.8 fL (ref 78.0–100.0)
MONO ABS: 0.9 10*3/uL (ref 0.1–1.0)
Monocytes Relative: 5 %
Neutro Abs: 15.5 10*3/uL — ABNORMAL HIGH (ref 1.7–7.7)
Neutrophils Relative %: 90 %
PLATELETS: 212 10*3/uL (ref 150–400)
RBC: 2.1 MIL/uL — AB (ref 4.22–5.81)
RDW: 13.7 % (ref 11.5–15.5)
WBC: 17.2 10*3/uL — AB (ref 4.0–10.5)

## 2016-02-28 LAB — ABO/RH: ABO/RH(D): O POS

## 2016-02-28 LAB — OCCULT BLOOD X 1 CARD TO LAB, STOOL: FECAL OCCULT BLD: POSITIVE — AB

## 2016-02-28 LAB — PREPARE RBC (CROSSMATCH)

## 2016-02-28 MED ORDER — INSULIN GLARGINE 100 UNIT/ML ~~LOC~~ SOLN
35.0000 [IU] | Freq: Every day | SUBCUTANEOUS | Status: DC
  Administered 2016-02-29 – 2016-03-01 (×2): 35 [IU] via SUBCUTANEOUS
  Filled 2016-02-28 (×3): qty 0.35

## 2016-02-28 MED ORDER — SODIUM CHLORIDE 0.9 % IV SOLN
Freq: Once | INTRAVENOUS | Status: AC
  Administered 2016-02-28: 09:00:00 via INTRAVENOUS

## 2016-02-28 MED ORDER — PANTOPRAZOLE SODIUM 40 MG IV SOLR
40.0000 mg | Freq: Two times a day (BID) | INTRAVENOUS | Status: DC
  Administered 2016-02-28 – 2016-03-02 (×8): 40 mg via INTRAVENOUS
  Filled 2016-02-28 (×9): qty 40

## 2016-02-28 MED ORDER — SODIUM CHLORIDE 0.9 % IV SOLN: Freq: Once | INTRAVENOUS | Status: DC

## 2016-02-28 NOTE — Progress Notes (Signed)
Critical hemglobin value of 6.4, MD notified. Also reported 2 small black sticky stools earlier in shift. MD requested type and screen and occult stool sample.

## 2016-02-28 NOTE — Consult Note (Signed)
Referring Provider: No ref. provider found Primary Care Physician:  Richard Noble, MD Primary Gastroenterologist:  Dr. Gala Romney  Reason for Consultation:  Anemia  HPI: Pleasant obese 75 year old gentleman admitted to the hospital with cavitary pneumonia, pleural effusion and respiratory failure requiring intubation for about 3 days. Now extubated. Staff noticed a couple of dark black sticky stools over the past 12 hours. Hemoccult positive. No hematemesis or hematochezia. Hemoptysis noted just prior to admission. Hemoglobin 9.9 on 10/25; 7.9 on 10/27; 7.4 on 10/28; 6.4 this morning. According to nursing staff, his first 2 units of packed blood cells this admission ordered for today. IV H2 blocker therapy has been switched to twice a day Protonix  He remains hemodynamically stable.   Patient denies chronic symptoms of GERD, odynophagia, dysphagia, early satiety, abdominal pain, nausea or vomiting.  Patient does admit to taking aspirin powders on a regular basis leading to this hospitalization. He has also been on methylprednisolone while here. Although meloxicam is mentioned as one of his outpatient medications, he denies other NSAIDs.  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  Seen by Dr. Rosana Hoes. Not felt to have any surgical process. No history of upper GI bleeding previously. No prior EGD. Last colonoscopy by me in 2014 revealed a tortuous colon. Patient does have issues with chronic constipation.   Past Medical History:  Diagnosis Date  . Cervical disc disease   . Chronic constipation   . Diabetes mellitus without complication ([place])    Borderline diabetic-diet controlled  . Hyperlipidemia   . Hypertension   . Peripheral neuropathy [facility])     Past Surgical History:  Procedure Laterality Date  . COLONOSCOPY  2003   RMR: internal hemorrhoids, otherwise normal  . COLONOSCOPY  05/30/2012   Procedure: COLONOSCOPY;  Surgeon: Daneil Dolin, MD;  Location: [facility]  ENDO SUITE;  Service: Endoscopy;  Laterality: N/A;  12:00  . HERNIA REPAIR    . TONSILLECTOMY      Prior to Admission medications   Medication Sig Start Date End Date Taking? Authorizing Provider  AMITIZA 24 MCG capsule Take 2 capsules by mouth daily. 08/14/13  Yes Historical Provider, MD  Aspirin-Salicylamide-Caffeine (BC HEADACHE POWDER PO) Take 1 Package by mouth daily as needed (pain).   Yes Historical Provider, MD  atorvastatin (LIPITOR) 10 MG tablet Take 10 mg by mouth daily.  05/10/12  Yes Historical Provider, MD  benzonatate (TESSALON) 200 MG capsule Take 200 mg by mouth 3 (three) times daily as needed for cough.   Yes Historical Provider, MD  furosemide (LASIX) 40 MG tablet Take 1 tablet (40 mg total) by mouth 2 (two) times daily. Patient taking differently: Take 40 mg by mouth daily.  10/12/15  Yes Richard Belling, MD  HYDROcodone-acetaminophen (NORCO) 7.5-325 MG tablet Take 1 tablet by mouth every 6 (six) hours as needed for moderate pain (Must last 30 days.Do not drive or operate machinery while taking this medicine.). 01/14/16  Yes Sanjuana Kava, MD  LORazepam (ATIVAN) 2 MG tablet Take 2 mg by mouth at bedtime.   Yes Historical Provider, MD  losartan (COZAAR) 50 MG tablet Take 50 mg by mouth daily.   Yes Historical Provider, MD  metFORMIN (GLUCOPHAGE) 500 MG tablet Take 1 tablet by mouth 2 (two) times daily. 02/08/16  Yes Historical Provider, MD  mirtazapine (REMERON) 15 MG tablet Take 15 mg by mouth at bedtime.  08/18/14  Yes Historical Provider, MD  potassium chloride SA (K-DUR,KLOR-CON) 20 MEQ tablet Take 20 mEq by mouth daily.  08/26/13  Yes Historical Provider, MD  pregabalin (LYRICA) 150 MG capsule Take 150 mg by mouth 2 (two) times daily.   Yes Historical Provider, MD  traZODone (DESYREL) 50 MG tablet Take 2 tablets by mouth at bedtime.  07/28/14  Yes Historical Provider, MD    Current Facility-Administered Medications  Medication Dose Route Frequency Provider Last Rate Last  Dose  . 0.9 %  sodium chloride infusion   Intravenous Continuous Richard Hillock, MD 75 mL/hr at 02/18/16 1425    . 0.9 %  sodium chloride infusion   Intravenous Continuous Richard Du, MD 50 mL/hr at 02/28/16 0023    . 0.9 %  sodium chloride infusion   Intravenous Once Richard Du, MD      . albuterol (PROVENTIL) (2.5 MG/3ML) 0.083% nebulizer solution 2.5 mg  2.5 mg Nebulization Q4H PRN Richard Du, MD      . albuterol (PROVENTIL) (2.5 MG/3ML) 0.083% nebulizer solution 2.5 mg  2.5 mg Nebulization TID Richard Noble, MD   2.5 mg at 02/28/16 0846  . alum & mag hydroxide-simeth (MAALOX/MYLANTA) 200-200-20 MG/5ML suspension 30 mL  30 mL Oral TID PRN Richard Hau, MD      . fentaNYL (SUBLIMAZE) 2,500 mcg in sodium chloride 0.9 % 250 mL (10 mcg/mL) infusion  10 mcg/hr Intravenous Continuous Richard Du, MD 5 mL/hr at 02/23/16 0830 50 mcg/hr at 02/23/16 0830  . furosemide (LASIX) injection 40 mg  40 mg Intravenous Q12H Richard Du, MD   40 mg at 02/28/16 0749  . heparin injection 5,000 Units  5,000 Units Subcutaneous Q8H Richard Hau, MD   5,000 Units at 02/27/16 2117  . HYDROcodone-acetaminophen (NORCO/VICODIN) 5-325 MG per tablet 1 tablet  1 tablet Oral Q6H PRN Richard Hillock, MD   1 tablet at 02/25/16 1801  . imipenem-cilastatin (PRIMAXIN) 500 mg in sodium chloride 0.9 % 100 mL IVPB  500 mg Intravenous Q6H Richard Leonie Green, MD   500 mg at 02/28/16 0928  . insulin aspart (novoLOG) injection 0-20 Units  0-20 Units Subcutaneous TID AC & HS Richard Du, MD   7 Units at 02/28/16 [phone]  . [START ON 02/29/2016] insulin glargine (LANTUS) injection 35 Units  35 Units Subcutaneous Daily Richard Du, MD      . LORazepam (ATIVAN) tablet 1 mg  1 mg Oral BID Richard Noble, MD   1 mg at 02/28/16 0900  . losartan (COZAAR) tablet 50 mg  50 mg Oral Daily Richard Hau, MD   50 mg at 02/28/16 0900  . methylPREDNISolone sodium succinate (SOLU-MEDROL) 40 mg/mL injection  40 mg  40 mg Intravenous Q12H Richard Du, MD   40 mg at 02/28/16 0800  . mirtazapine (REMERON) tablet 15 mg  15 mg Oral QHS Richard Hau, MD   15 mg at 02/27/16 2117  . ondansetron (ZOFRAN) tablet 4 mg  4 mg Oral Q6H PRN Richard Hau, MD       Or  . ondansetron [facility]) injection 4 mg  4 mg Intravenous Q6H PRN Richard Hau, MD      . pantoprazole (PROTONIX) injection 40 mg  40 mg Intravenous Q12H Richard Du, MD   40 mg at 02/28/16 0932  . potassium chloride SA (K-DUR,KLOR-CON) CR tablet 20 mEq  20 mEq Oral BID Richard Du, MD   20 mEq at 02/28/16 0900  . pregabalin (LYRICA) capsule 150 mg  150 mg Oral BID Richard Hau, MD  150 mg at 02/28/16 0900  . senna-docusate (Senokot-S) tablet 1 tablet  1 tablet Oral QHS PRN Richard Hau, MD   1 tablet at 02/27/16 2159  . sodium chloride flush (NS) 0.9 % injection 3 mL  3 mL Intravenous Q12H Richard Hau, MD   3 mL at 02/27/16 2129  . traZODone (DESYREL) tablet 100 mg  100 mg Oral QHS Richard Hau, MD   100 mg at 02/27/16 2116  . vancomycin (VANCOCIN) 1,250 mg in sodium chloride 0.9 % 250 mL IVPB  1,250 mg Intravenous Q8H Richard Noble, MD   1,250 mg at 02/28/16 0800    Allergies as of 02/18/2016 - Review Complete 02/18/2016  Allergen Reaction Noted  . Penicillins Other (See Comments) 05/21/2012  . Tegretol [carbamazepine] Itching 05/21/2012    Family History  Problem Relation Age of Onset  . Cancer Father   . Colon cancer Paternal Uncle   . Diabetes Brother     Social History   Social History  . Marital status: Married    Spouse name: N/A  . Number of children: N/A  . Years of education: N/A   Occupational History  . [employer]     retired   Social History Main Topics  . Smoking status: Current Every Day Smoker    Years: 40.00  . Smokeless tobacco: Former Systems developer    Types: Chew  . Alcohol use No  . Drug use: No  .  Sexual activity: Not on file   Other Topics Concern  . Not on file   Social History Narrative  . No narrative on file    Review of Systems: As in history of present illness  Physical Exam: Vital signs in last 24 hours: Temp:  [97.6 F (36.4 C)-99.9 F (37.7 C)] 97.6 F (36.4 C) (10/29 1136) Pulse Rate:  [85-116] 110 (10/29 1136) Resp:  [8-36] 36 (10/29 1136) BP: (88-135)/(41-84) 114/76 (10/29 1136) SpO2:  [80 %-96 %] 94 % (10/29 1136) Last BM Date: 02/27/16 General:   Obese, alert pleasant and cooperative in NAD Eyes:  Sclera clear, no icterus.   Conjunctiva pale  Heart:  Regular rate and rhythm; no murmurs, clicks, rubs,  or gallops. Abdomen:  Significantly obese. Large area of bruising-right abdomen-site of heparin injections.  Positive bowel sounds. Abdomen is entirely soft and nontender without appreciable mass or organomegaly.  Intake/Output from previous day: 10/28 0701 - 10/29 0700 In: 7731.3 [P.O.:1680; I.V.:2301.3; IV Piggyback:1750] Out: 2400 [Urine:2400] Intake/Output this shift: Total I/O In: 1185 [P.O.:600; I.V.:250; Blood:335] Out: 1800 [Urine:1800]  Lab Results:  Recent Labs  02/26/16 0534 02/27/16 0430 02/28/16 0349  WBC 16.8* 20.6* 17.2*  HGB 7.9* 7.4* 6.4*  HCT 24.2* 23.0* 19.9*  PLT 241 239 212   BMET  Recent Labs  02/26/16 0534 02/27/16 0430 02/28/16 0349  NA 146* 147* 141  K 3.9 3.7 3.6  CL 112* 111 104  CO2 31 33* 34*  GLUCOSE 327* 246* 230*  BUN 28* 24* 19  CREATININE 0.64 0.58* 0.62  CALCIUM 7.6* 7.6* 7.2*   LFT  Recent Labs  02/28/16 0349  PROT 4.5*  ALBUMIN 1.7*  AST 21  ALT 19  ALKPHOS 7  BILITOT 0.5    Impression:   Pleasant obese 75 year old gentleman admitted to the hospital with pneumonia and respiratory failure. Recent acute decline in hemoglobin and melena. Recent exposure NSAIDs in the way of aspirin powders and possibly at least one other nonsteroidal agent although  patient denies). Sketchy history of  hemoptysis on admission. Most likely patient has suffered an upper GI bleed- NSAID gastropathy / peptic ulcer disease high on the differential list.  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  This deserves no further evaluation.  Patient remains hemodynamically stable. Respiratory status remained somewhat tenuous. He would likely tolerate an EGD poorly at this time.  Recommendations:   Fully agree with transfusion of packed red blood cells.   Also,  agree with switching him from a H2 blocker to twice a day PPI therapy intravenously.  Back off on his diet to clear liquids.  Continue SCDs but discontinue subcutaneous heparin for the time being  Hold off on urgent EGD for now.   His overall condition needs to be better optimized. Will reevaluate after he has had thoracentesis and has been transfused.  I would anticipate an EGD in perhaps 24-48 hours so long as he continues to improve clinically.  Further recommendations to follow.       Notice:  This dictation was prepared with Dragon dictation along with smaller phrase technology. Any transcriptional errors that result from this process are unintentional and may not be corrected upon review.

## 2016-02-28 NOTE — Progress Notes (Signed)
Subjective:  He says he doesn't feel well. He was noted to have dark stools last night but they were not checked for blood. His hemoglobin level has dropped again and he is now less than 7 and will need a blood transfusion. He denies any abdominal pain. He has generalized weakness and aching. No chest pain. He is still short of breath. He is coughing but it's mostly nonproductive now.  Objective: Vital signs in last 24 hours: Temp:  [98.1 F (36.7 C)-98.6 F (37 C)] 98.1 F (36.7 C) (10/29 0726) Pulse Rate:  [85-120] 112 (10/29 0800) Resp:  [8-35] 33 (10/29 0800) BP: (88-135)/(41-84) 122/67 (10/29 0800) SpO2:  [80 %-96 %] 94 % (10/29 0800) Weight change:  Last BM Date: 02/27/16  Intake/Output from previous day: 10/28 0701 - 10/29 0700 In: 7731.3 [P.O.:1680; I.V.:2301.3; IV Piggyback:1750] Out: 2400 [Urine:2400]  PHYSICAL EXAM General appearance: alert, cooperative, mild distress, morbidly obese and Overall looks better. His respiratory rate is down and he has less respiratory effort Resp: Rhonchi bilaterally. Decreased breath sounds on the right clearer than yesterday Cardio: regular rate and rhythm, S1, S2 normal, no murmur, click, rub or gallop GI: No abdominal tenderness. Bowel sounds are present Extremities: He still has generalized puffiness but he has done better with the Lasix. Skin is warm and dry. He still has venous stasis changes. Mucous membranes are moist  Lab Results:  Results for orders placed or performed during the hospital encounter of 02/18/16 (from the past 48 hour(s))  Glucose, capillary     Status: Abnormal   Collection Time: 02/26/16 11:35 AM  Result Value Ref Range   Glucose-Capillary 263 (H) 65 - 99 mg/dL  Glucose, capillary     Status: Abnormal   Collection Time: 02/26/16  4:18 PM  Result Value Ref Range   Glucose-Capillary 216 (H) 65 - 99 mg/dL   Comment 1 Notify RN   Glucose, capillary     Status: Abnormal   Collection Time: 02/26/16  9:42 PM   Result Value Ref Range   Glucose-Capillary 156 (H) 65 - 99 mg/dL   Comment 1 Notify RN    Comment 2 Document in Chart   Basic metabolic panel     Status: Abnormal   Collection Time: 02/27/16  4:30 AM  Result Value Ref Range   Sodium 147 (H) 135 - 145 mmol/L   Potassium 3.7 3.5 - 5.1 mmol/L   Chloride 111 101 - 111 mmol/L   CO2 33 (H) 22 - 32 mmol/L   Glucose, Bld 246 (H) 65 - 99 mg/dL   BUN 24 (H) 6 - 20 mg/dL   Creatinine, Ser 0.58 (L) 0.61 - 1.24 mg/dL   Calcium 7.6 (L) 8.9 - 10.3 mg/dL   GFR calc non Af Amer >60 >60 mL/min   GFR calc Af Amer >60 >60 mL/min    Comment: (NOTE) The eGFR has been calculated using the CKD EPI equation. This calculation has not been validated in all clinical situations. eGFR's persistently <60 mL/min signify possible Chronic Kidney Disease.    Anion gap 3 (L) 5 - 15  CBC with Differential/Platelet     Status: Abnormal   Collection Time: 02/27/16  4:30 AM  Result Value Ref Range   WBC 20.6 (H) 4.0 - 10.5 K/uL   RBC 2.42 (L) 4.22 - 5.81 MIL/uL   Hemoglobin 7.4 (L) 13.0 - 17.0 g/dL   HCT 23.0 (L) 39.0 - 52.0 %   MCV 95.0 78.0 - 100.0 fL   MCH  30.6 26.0 - 34.0 pg   MCHC 32.2 30.0 - 36.0 g/dL   RDW 13.4 11.5 - 15.5 %   Platelets 239 150 - 400 K/uL   Neutrophils Relative % 91 %   Neutro Abs 18.7 (H) 1.7 - 7.7 K/uL   Lymphocytes Relative 5 %   Lymphs Abs 1.0 0.7 - 4.0 K/uL   Monocytes Relative 4 %   Monocytes Absolute 0.8 0.1 - 1.0 K/uL   Eosinophils Relative 0 %   Eosinophils Absolute 0.0 0.0 - 0.7 K/uL   Basophils Relative 0 %   Basophils Absolute 0.0 0.0 - 0.1 K/uL  Glucose, capillary     Status: Abnormal   Collection Time: 02/27/16  7:09 AM  Result Value Ref Range   Glucose-Capillary 218 (H) 65 - 99 mg/dL  Glucose, capillary     Status: Abnormal   Collection Time: 02/27/16 11:43 AM  Result Value Ref Range   Glucose-Capillary 314 (H) 65 - 99 mg/dL  Glucose, capillary     Status: Abnormal   Collection Time: 02/27/16  4:31 PM  Result  Value Ref Range   Glucose-Capillary 281 (H) 65 - 99 mg/dL  Glucose, capillary     Status: Abnormal   Collection Time: 02/27/16  9:49 PM  Result Value Ref Range   Glucose-Capillary 193 (H) 65 - 99 mg/dL  CBC with Differential/Platelet     Status: Abnormal   Collection Time: 02/28/16  3:49 AM  Result Value Ref Range   WBC 17.2 (H) 4.0 - 10.5 K/uL   RBC 2.10 (L) 4.22 - 5.81 MIL/uL   Hemoglobin 6.4 (LL) 13.0 - 17.0 g/dL    Comment: CRITICAL RESULT CALLED TO, READ BACK BY AND VERIFIED WITH: [email or web address] AT 0458 ON 02/28/2016 BY WOODS.M    HCT 19.9 (L) 39.0 - 52.0 %   MCV 94.8 78.0 - 100.0 fL   MCH 30.5 26.0 - 34.0 pg   MCHC 32.2 30.0 - 36.0 g/dL   RDW 13.7 11.5 - 15.5 %   Platelets 212 150 - 400 K/uL   Neutrophils Relative % 90 %   Neutro Abs 15.5 (H) 1.7 - 7.7 K/uL   Lymphocytes Relative 5 %   Lymphs Abs 0.8 0.7 - 4.0 K/uL   Monocytes Relative 5 %   Monocytes Absolute 0.9 0.1 - 1.0 K/uL   Eosinophils Relative 0 %   Eosinophils Absolute 0.0 0.0 - 0.7 K/uL   Basophils Relative 0 %   Basophils Absolute 0.0 0.0 - 0.1 K/uL  Comprehensive metabolic panel     Status: Abnormal   Collection Time: 02/28/16  3:49 AM  Result Value Ref Range   Sodium 141 135 - 145 mmol/L   Potassium 3.6 3.5 - 5.1 mmol/L   Chloride 104 101 - 111 mmol/L   CO2 34 (H) 22 - 32 mmol/L   Glucose, Bld 230 (H) 65 - 99 mg/dL   BUN 19 6 - 20 mg/dL   Creatinine, Ser 0.62 0.61 - 1.24 mg/dL   Calcium 7.2 (L) 8.9 - 10.3 mg/dL   Total Protein 4.5 (L) 6.5 - 8.1 g/dL   Albumin 1.7 (L) 3.5 - 5.0 g/dL   AST 21 15 - 41 U/L   ALT 19 17 - 63 U/L   Alkaline Phosphatase 72 38 - 126 U/L   Total Bilirubin 0.5 0.3 - 1.2 mg/dL   GFR calc non Af Amer >60 >60 mL/min   GFR calc Af Amer >60 >60 mL/min    Comment: (NOTE) The eGFR has  been calculated using the CKD EPI equation. This calculation has not been validated in all clinical situations. eGFR's persistently <60 mL/min signify possible Chronic Kidney Disease.    Anion gap 3 (L)  5 - 15  Type and screen [facility]     Status: None (Preliminary result)   Collection Time: 02/28/16  7:19 AM  Result Value Ref Range   ABO/RH(D) O POS    Antibody Screen NEG    Sample Expiration 03/02/2016    Unit Number [id number]    Blood Component Type RBC LR PHER1    Unit division 00    Status of Unit ALLOCATED    Transfusion Status OK TO TRANSFUSE    Crossmatch Result Compatible    Unit Number [id number]    Blood Component Type RED CELLS,LR    Unit division 00    Status of Unit ALLOCATED    Transfusion Status OK TO TRANSFUSE    Crossmatch Result Compatible   Prepare RBC     Status: None   Collection Time: 02/28/16  7:19 AM  Result Value Ref Range   Order Confirmation ORDER PROCESSED BY BLOOD BANK   ABO/Rh     Status: None (Preliminary result)   Collection Time: 02/28/16  7:19 AM  Result Value Ref Range   ABO/RH(D) O POS   Glucose, capillary     Status: Abnormal   Collection Time: 02/28/16  7:25 AM  Result Value Ref Range   Glucose-Capillary 219 (H) 65 - 99 mg/dL    ABGS No results for input(s): PHART, PO2ART, TCO2, HCO3 in the last 72 hours.  Invalid input(s): PCO2 CULTURES Recent Results (from the past 240 hour(s))  MRSA PCR Screening     Status: Abnormal   Collection Time: 02/19/16  4:47 PM  Result Value Ref Range Status   MRSA by PCR POSITIVE (A) NEGATIVE Final    Comment:        The GeneXpert MRSA Assay (FDA approved for NASAL specimens only), is one component of a comprehensive MRSA colonization surveillance program. It is not intended to diagnose MRSA infection nor to guide or monitor treatment for MRSA infections. CRITICAL RESULT CALLED TO, READ BACK BY AND VERIFIED WITH: PARIS,C AT 2342 ON 02/19/2016 BY WOODS,M    Studies/Results: Dg Chest Port 1 View  Result Date: 02/27/2016 CLINICAL DATA:  Pneumonia. EXAM: PORTABLE CHEST 1 VIEW COMPARISON:  02/26/2016. FINDINGS: Stable enlarged cardiac silhouette no significant change in a  moderate to large-sized right pleural effusion. The left lung remains clear. The left PICC tip remains in the region of the origin of the superior vena cava. Unremarkable bones. IMPRESSION: 1. Stable moderate to large-sized right pleural effusion and cardiomegaly. 2. Left PICC tip at the origin of the superior vena cava. Electronically Signed   By: Claudie Revering M.D.   On: 02/27/2016 10:17   Dg Chest Port 1 View  Result Date: 02/26/2016 CLINICAL DATA:  Pt couldn't verbally explain reason for exam. Tech and nurse techs assisted to try and stable pt upright to get sufficient AP view of chest. Reason for exam is pneumonia. EXAM: PORTABLE CHEST 1 VIEW COMPARISON:  02/22/2016 FINDINGS: Opacity at the right lung base has increased consistent with enlargement of the right pleural effusion, now moderate to large in size. No convincing left pleural effusion. No pneumothorax. No evidence of pulmonary edema.  No definite pneumonia. Cardiac silhouette is enlarged but stable. Left PICC has its tip at the left brachiocephalic vein superior vena cava confluence. IMPRESSION: 1.  Increased right pleural effusion, now moderate to large. 2. Convincing pneumonia.  No pulmonary edema. Electronically Signed   By: Lajean Manes M.D.   On: 02/26/2016 10:04    Medications:  Prior to Admission:  Prescriptions Prior to Admission  Medication Sig Dispense Refill Last Dose  . AMITIZA 24 MCG capsule Take 2 capsules by mouth daily.   02/17/2016 at Unknown time  . Aspirin-Salicylamide-Caffeine (BC HEADACHE POWDER PO) Take 1 Package by mouth daily as needed (pain).   02/17/2016 at Unknown time  . atorvastatin (LIPITOR) 10 MG tablet Take 10 mg by mouth daily.    02/17/2016 at Unknown time  . benzonatate (TESSALON) 200 MG capsule Take 200 mg by mouth 3 (three) times daily as needed for cough.   unknown  . furosemide (LASIX) 40 MG tablet Take 1 tablet (40 mg total) by mouth 2 (two) times daily. (Patient taking differently: Take 40 mg by  mouth daily. ) 4 tablet 0 02/17/2016 at Unknown time  . HYDROcodone-acetaminophen (NORCO) 7.5-325 MG tablet Take 1 tablet by mouth every 6 (six) hours as needed for moderate pain (Must last 30 days.Do not drive or operate machinery while taking this medicine.). 180 tablet 0 unknown  . LORazepam (ATIVAN) 2 MG tablet Take 2 mg by mouth at bedtime.   02/17/2016 at Unknown time  . losartan (COZAAR) 50 MG tablet Take 50 mg by mouth daily.   02/17/2016 at Unknown time  . metFORMIN (GLUCOPHAGE) 500 MG tablet Take 1 tablet by mouth 2 (two) times daily.   02/17/2016 at Unknown time  . mirtazapine (REMERON) 15 MG tablet Take 15 mg by mouth at bedtime.    02/17/2016 at Unknown time  . potassium chloride SA (K-DUR,KLOR-CON) 20 MEQ tablet Take 20 mEq by mouth daily.    02/17/2016 at Unknown time  . pregabalin (LYRICA) 150 MG capsule Take 150 mg by mouth 2 (two) times daily.   02/17/2016 at Unknown time  . traZODone (DESYREL) 50 MG tablet Take 2 tablets by mouth at bedtime.    02/17/2016 at Unknown time   Scheduled: . sodium chloride   Intravenous Once  . albuterol  2.5 mg Nebulization TID  . furosemide  40 mg Intravenous Q12H  . heparin  5,000 Units Subcutaneous Q8H  . imipenem-cilastatin  500 mg Intravenous Q6H  . insulin aspart  0-20 Units Subcutaneous TID AC & HS  . insulin glargine  30 Units Subcutaneous Daily  . LORazepam  1 mg Oral BID  . losartan  50 mg Oral Daily  . methylPREDNISolone (SOLU-MEDROL) injection  40 mg Intravenous Q12H  . mirtazapine  15 mg Oral QHS  . pantoprazole (PROTONIX) IV  40 mg Intravenous Q12H  . potassium chloride  20 mEq Oral BID  . pregabalin  150 mg Oral BID  . sodium chloride flush  3 mL Intravenous Q12H  . traZODone  100 mg Oral QHS  . vancomycin  1,250 mg Intravenous Q8H   Continuous: . sodium chloride 75 mL/hr at 02/18/16 1425  . sodium chloride 50 mL/hr at 02/28/16 0023  . fentaNYL infusion INTRAVENOUS 50 mcg/hr (02/23/16 0830)   UYQ:[id number], alum & mag  hydroxide-simeth, HYDROcodone-acetaminophen, ondansetron **OR** ondansetron (ZOFRAN) IV, senna-docusate  Assesment: He was admitted with cavitary pneumonia. Likely from aspiration. He was initially treated on the floor developed increasing shortness of breath with acute hypoxic respiratory failure and ultimately was intubated. He was able to be extubated last week and has done fairly well. He was septic on admission and was  markedly positive fluid balance but his intake and output were incomplete. That is improving. He has what appears to be a right pleural effusion but when he had ultrasound a little over a week ago it did not look like he had enough fluid to try to draw off. That will be rechecked tomorrow.  His sepsis has resolved. He is still hypoxic.  He now has GI bleeding. Hemoglobin level is 6.4. He is going to need a blood transfusion. Switch from Pepcid to Protonix. GI consultation. Follow serial hemoglobin and hematocrits. I think it's pretty obviously GI bleeding but he has not had documented stool for blood so will obtain that.  He had toxic metabolic encephalopathy and that has improved  He has diabetes which is about the same  He has extreme weakness and deconditioning and he's going to need to go to a skilled care facility when he is discharged Principal Problem:   Cavitary pneumonia Active Problems:   Hemoptysis   Morbid obesity ([place])   Pneumatosis intestinalis   Pressure injury of skin   Type II diabetes mellitus, uncontrolled (HCC)   Sepsis ([place])   Elevated troponin level   Acute respiratory failure with hypoxia (HCC)   Encephalopathy, metabolic    Plan: 2 units packed red blood cells. Continue his other treatments. Switch to Protonix. CT chest tomorrow with plans for thoracentesis afterwards if he has significant fluid. GI consultation.    LOS: 10 days   HAWKINS,EDWARD L 02/28/2016, 8:43 AM

## 2016-02-29 ENCOUNTER — Other Ambulatory Visit (HOSPITAL_COMMUNITY): Payer: Medicare Other

## 2016-02-29 ENCOUNTER — Inpatient Hospital Stay (HOSPITAL_COMMUNITY): Payer: Medicare Other

## 2016-02-29 DIAGNOSIS — K922 Gastrointestinal hemorrhage, unspecified: Secondary | ICD-10-CM

## 2016-02-29 LAB — BASIC METABOLIC PANEL
ANION GAP: 6 (ref 5–15)
BUN: 16 mg/dL (ref 6–20)
CALCIUM: 7.2 mg/dL — AB (ref 8.9–10.3)
CO2: 34 mmol/L — AB (ref 22–32)
Chloride: 97 mmol/L — ABNORMAL LOW (ref 101–111)
Creatinine, Ser: 0.59 mg/dL — ABNORMAL LOW (ref 0.61–1.24)
GFR calc non Af Amer: >60 mL/min (ref >60)
Glucose, Bld: 247 mg/dL — ABNORMAL HIGH (ref 65–99)
Potassium: 3.5 mmol/L (ref 3.5–5.1)
Sodium: 137 mmol/L (ref 135–145)

## 2016-02-29 LAB — BODY FLUID CELL COUNT WITH DIFFERENTIAL
EOS FL: 0 %
LYMPHS FL: 39 %
MONOCYTE-MACROPHAGE-SEROUS FLUID: 9 % — AB (ref 50–90)
Neutrophil Count, Fluid: 52 % — ABNORMAL HIGH (ref 0–25)
Total Nucleated Cell Count, Fluid: 2818 cu mm — ABNORMAL HIGH (ref 0–1000)

## 2016-02-29 LAB — CBC WITH DIFFERENTIAL/PLATELET
BASOS ABS: 0 10*3/uL (ref 0.0–0.1)
Basophils Relative: 0 %
Eosinophils Absolute: 0 10*3/uL (ref 0.0–0.7)
Eosinophils Relative: 0 %
HEMATOCRIT: 25.5 % — AB (ref 39.0–52.0)
HEMOGLOBIN: 8.5 g/dL — AB (ref 13.0–17.0)
LYMPHS ABS: 0.8 10*3/uL (ref 0.7–4.0)
LYMPHS PCT: 5 %
MCH: 30.1 pg (ref 26.0–34.0)
MCHC: 33.3 g/dL (ref 30.0–36.0)
MCV: 90.4 fL (ref 78.0–100.0)
Monocytes Absolute: 0.8 10*3/uL (ref 0.1–1.0)
Monocytes Relative: 5 %
NEUTROS ABS: 13.7 10*3/uL — AB (ref 1.7–7.7)
Neutrophils Relative %: 90 %
Platelets: 241 10*3/uL (ref 150–400)
RBC: 2.82 MIL/uL — AB (ref 4.22–5.81)
RDW: 15.7 % — ABNORMAL HIGH (ref 11.5–15.5)
WBC: 15.3 10*3/uL — AB (ref 4.0–10.5)

## 2016-02-29 LAB — TYPE AND SCREEN
ABO/RH(D): O POS
Antibody Screen: NEGATIVE
UNIT DIVISION: 0
UNIT DIVISION: 0

## 2016-02-29 LAB — GLUCOSE, CAPILLARY
GLUCOSE-CAPILLARY: 278 mg/dL — AB (ref 65–99)
GLUCOSE-CAPILLARY: 214 mg/dL — AB (ref 65–99)
GLUCOSE-CAPILLARY: 240 mg/dL — AB (ref 65–99)
Glucose-Capillary: 227 mg/dL — ABNORMAL HIGH (ref 65–99)

## 2016-02-29 LAB — HEMOGLOBIN AND HEMATOCRIT, BLOOD
HEMATOCRIT: 26.5 % — AB (ref 39.0–52.0)
HEMOGLOBIN: 8.6 g/dL — AB (ref 13.0–17.0)

## 2016-02-29 LAB — GLUCOSE, SEROUS FLUID: GLUCOSE FL: 237 mg/dL

## 2016-02-29 LAB — GRAM STAIN

## 2016-02-29 LAB — PROTEIN, BODY FLUID: Total protein, fluid: 2.7 g/dL

## 2016-02-29 MED ORDER — IOPAMIDOL (ISOVUE-300) INJECTION 61%
75.0000 mL | Freq: Once | INTRAVENOUS | Status: AC | PRN
  Administered 2016-02-29: 75 mL via INTRAVENOUS

## 2016-02-29 MED ORDER — POTASSIUM CHLORIDE CRYS ER 20 MEQ PO TBCR
40.0000 meq | EXTENDED_RELEASE_TABLET | Freq: Two times a day (BID) | ORAL | Status: DC
  Administered 2016-02-29 – 2016-03-01 (×4): 40 meq via ORAL
  Filled 2016-02-29 (×4): qty 2

## 2016-02-29 NOTE — Procedures (Signed)
PreOperative Dx: RIGHT pleural effusion Postoperative Dx: RIGHT hemothorax Procedure:   US guided RIGHT thoracentesis Radiologist:  Thornton Papas Anesthesia:  10 ml of 1% lidocaine Specimen:  800 mL of dark old bloody fluid EBL:   < 1 ml Complications: None

## 2016-02-29 NOTE — Progress Notes (Signed)
    Subjective: No overt GI bleeding per nursing staff. Patient denies any nausea, vomiting, abdominal pain. States he has to have a BM, which he has been telling nursing staff this morning but no BM yet. Confusion, stating it is 1917 but quickly reoriented to 2017. Asked if he was dying.   Objective: Vital signs in last 24 hours: Temp:  [97 F (36.1 C)-99.9 F (37.7 C)] 97.5 F (36.4 C) (10/30 0400) Pulse Rate:  [100-114] 103 (10/30 0400) Resp:  [0-36] 0 (10/30 0400) BP: (100-169)/(59-124) 140/78 (10/30 0400) SpO2:  [92 %-97 %] 96 % (10/30 0400) Weight:  [323 lb 10.2 oz (146.8 kg)] 323 lb 10.2 oz (146.8 kg) (10/30 0500) Last BM Date: 02/27/16 General:   Alert and oriented to person, place. States it is 92 and quickly corrected himself.  Abdomen:  Bowel sounds present, soft, obese, no HSM No rebound or guarding. No masses appreciated  Msk:  Symmetrical without gross deformities. Normal posture. Extremities:  1 + edema bilateral lower extremities  Psych:  Alert and cooperative. Normal mood and affect.  Intake/Output from previous day: 10/29 0701 - 10/30 0700 In: 3700 [P.O.:1080; I.V.:1250; Blood:670; IV Piggyback:700] Out: 5000 [Urine:5000] Intake/Output this shift: No intake/output data recorded.  Lab Results:  Recent Labs  02/27/16 0430 02/28/16 0349 02/29/16 0400  WBC 20.6* 17.2* 15.3*  HGB 7.4* 6.4* 8.5*  HCT 23.0* 19.9* 25.5*  PLT 239 212 241   BMET  Recent Labs  02/27/16 0430 02/28/16 0349 02/29/16 0400  NA 147* 141 137  K 3.7 3.6 3.5  CL 111 104 97*  CO2 33* 34* 34*  GLUCOSE 246* 230* 247*  BUN 24* 19 16  CREATININE 0.58* 0.62 0.59*  CALCIUM 7.6* 7.2* 7.2*   LFT  Recent Labs  02/28/16 0349  PROT 4.5*  ALBUMIN 1.7*  AST 21  ALT 19  ALKPHOS 72  BILITOT 0.5     Studies/Results: Dg Chest Port 1 View  Result Date: 02/27/2016 CLINICAL DATA:  Pneumonia. EXAM: PORTABLE CHEST 1 VIEW COMPARISON:  02/26/2016. FINDINGS: Stable enlarged cardiac  silhouette no significant change in a moderate to large-sized right pleural effusion. The left lung remains clear. The left PICC tip remains in the region of the origin of the superior vena cava. Unremarkable bones. IMPRESSION: 1. Stable moderate to large-sized right pleural effusion and cardiomegaly. 2. Left PICC tip at the origin of the superior vena cava. Electronically Signed   By: Claudie Revering M.D.   On: 02/27/2016 10:17    Assessment: 75 year old male admitted with cavitary pneumonia, pleural effusion, requiring intubation (now extubated) and noted to have melena and acute drop in Hgb while inpatient with most likely differentials to include NSAID-induced gastropathy, PUD. Hgb improved after 2 units PRBCs. No further overt GI bleeding . Will need EGD in near future after respiratory status improved. Plans for CT chest today with thoracentesis if appropriate. Anticipate EGD Tuesday or Wednesday.   Plan: Continue to monitor for overt GI bleeding Monitor H/H Thoracentesis as planned per attending EGD once respiratory status improves ([place] Tuesday or Wednesday) Protonix BID Will continue to follow with you  Annitta Needs, ANP-BC [facility]     LOS: 11 days    02/29/2016, 7:42 AM

## 2016-02-29 NOTE — Progress Notes (Signed)
Initial Nutrition Assessment  DOCUMENTATION CODES:  Obese Class III     INTERVENTION:  Advance to Soft / CHO modified diet as tolerated   When pt is cleared for full liquids- start Glucerna Shake po TID, each supplement provides 220 kcal and 10 grams of protein    NUTRITION DIAGNOSIS:   Inadequate oral intake related to inability to eat as evidenced by Clear liquids diet unable to meet est protein and energy requirements  GOAL:   Provide needs based on ASPEN/SCCM guidelines   MONITOR:   TF tolerance, I & O's, Vent status, Labs, Weight trends  REASON FOR ASSESSMENT:   Change of status  ASSESSMENT:  Patient is off the vent now and taking a clear liquid diet  . He had a paracentesis (800 ml) removed. Chest x-ray findings persistent moderate right PE with significant atelectasis of right middle and lower lobes-after thoracentesis .  EGD pending.  His weight has increased by 8 kg x 7 days.   Recent Labs Lab 02/22/16 1718 02/23/16 0434  02/27/16 0430 02/28/16 0349 02/29/16 0400  NA  --  148*  < > 147* 141 137  K  --  3.8  < > 3.7 3.6 3.5  CL  --  118*  < > 111 104 97*  CO2  --  29  < > 33* 34* 34*  BUN  --  30*  < > 24* 19 16  CREATININE  --  0.71  < > 0.58* 0.62 0.59*  CALCIUM  --  7.8*  < > 7.6* 7.2* 7.2*  MG 2.3 2.4  --   --   --   --   PHOS 2.5 2.1*  --   --   --   --   GLUCOSE  --  329*  < > 246* 230* 247*  < > = values in this interval not displayed. Labs: Sodium 146,  Phos 2.1, Glucose 247   Meds: PPI, Insulin, Solu-Medrol  Diet Order:  Diet clear liquid Room service appropriate? Yes; Fluid consistency: Thin  Skin: Stage II left buttock  Last BM:  10/28 black / green stool (small)  Height:   Ht Readings from Last 1 Encounters:  02/23/16 5\' 11"  (1.803 m)    Weight:   Wt Readings from Last 1 Encounters:  02/29/16 (!) 323 lb 10.2 oz (146.8 kg)  Admit wt.-139 kg  Ideal Body Weight:  78 kg  BMI:  Body mass index is 45.14 kg/m.  Re- Estimated  Nutritional Needs:   Kcal:  [id number]  Protein:  156-179 gr  Fluid:  per MD   EDUCATION NEEDS:   Education needs no appropriate at this time    Colman Cater MS,RD,CSG,LDN Office: [phone] Pager: [phone]

## 2016-02-29 NOTE — Progress Notes (Signed)
Subjective: He says he feels pretty well. No further bleeding has been noted. He did receive blood yesterday. He is still very weak. No chest pain. He thinks his breathing is better. No abdominal pain nausea or vomiting. GI consultation is noted and appreciated  Objective: Vital signs in last 24 hours: Temp:  [97 F (36.1 C)-99.9 F (37.7 C)] 97.5 F (36.4 C) (10/30 0400) Pulse Rate:  [100-114] 103 (10/30 0400) Resp:  [0-36] 0 (10/30 0400) BP: (100-169)/(59-124) 140/78 (10/30 0400) SpO2:  [92 %-97 %] 96 % (10/30 0400) Weight:  [146.8 kg (323 lb 10.2 oz)] 146.8 kg (323 lb 10.2 oz) (10/30 0500) Weight change:  Last BM Date: 02/27/16  Intake/Output from previous day: 10/29 0701 - 10/30 0700 In: 3700 [P.O.:1080; I.V.:1250; Blood:670; IV Piggyback:700] Out: 5000 [Urine:5000]  PHYSICAL EXAM General appearance: alert, cooperative, mild distress and morbidly obese Resp: Rhonchi more on the right than on the left and mildly decreased breath sounds on the right Cardio: regular rate and rhythm, S1, S2 normal, no murmur, click, rub or gallop GI: soft, non-tender; bowel sounds normal; no masses,  no organomegaly Extremities: He still has venous stasis changes and still has generalized puffiness of the skin. Skin is warm and dry. Mucous membranes are moist. Pupils are reactive  Lab Results:  Results for orders placed or performed during the hospital encounter of 02/18/16 (from the past 48 hour(s))  Glucose, capillary     Status: Abnormal   Collection Time: 02/27/16 11:43 AM  Result Value Ref Range   Glucose-Capillary 314 (H) 65 - 99 mg/dL  Glucose, capillary     Status: Abnormal   Collection Time: 02/27/16  4:31 PM  Result Value Ref Range   Glucose-Capillary 281 (H) 65 - 99 mg/dL  Glucose, capillary     Status: Abnormal   Collection Time: 02/27/16  9:49 PM  Result Value Ref Range   Glucose-Capillary 193 (H) 65 - 99 mg/dL  CBC with Differential/Platelet     Status: Abnormal   Collection Time: 02/28/16  3:49 AM  Result Value Ref Range   WBC 17.2 (H) 4.0 - 10.5 K/uL   RBC 2.10 (L) 4.22 - 5.81 MIL/uL   Hemoglobin 6.4 (LL) 13.0 - 17.0 g/dL    Comment: CRITICAL RESULT CALLED TO, READ BACK BY AND VERIFIED WITH: [email or web address] AT 0458 ON 02/28/2016 BY WOODS.M    HCT 19.9 (L) 39.0 - 52.0 %   MCV 94.8 78.0 - 100.0 fL   MCH 30.5 26.0 - 34.0 pg   MCHC 32.2 30.0 - 36.0 g/dL   RDW 13.7 11.5 - 15.5 %   Platelets 212 150 - 400 K/uL   Neutrophils Relative % 90 %   Neutro Abs 15.5 (H) 1.7 - 7.7 K/uL   Lymphocytes Relative 5 %   Lymphs Abs 0.8 0.7 - 4.0 K/uL   Monocytes Relative 5 %   Monocytes Absolute 0.9 0.1 - 1.0 K/uL   Eosinophils Relative 0 %   Eosinophils Absolute 0.0 0.0 - 0.7 K/uL   Basophils Relative 0 %   Basophils Absolute 0.0 0.0 - 0.1 K/uL  Comprehensive metabolic panel     Status: Abnormal   Collection Time: 02/28/16  3:49 AM  Result Value Ref Range   Sodium 141 135 - 145 mmol/L   Potassium 3.6 3.5 - 5.1 mmol/L   Chloride 104 101 - 111 mmol/L   CO2 34 (H) 22 - 32 mmol/L   Glucose, Bld 230 (H) 65 - 99 mg/dL   BUN 19  6 - 20 mg/dL   Creatinine, Ser 0.62 0.61 - 1.24 mg/dL   Calcium 7.2 (L) 8.9 - 10.3 mg/dL   Total Protein 4.5 (L) 6.5 - 8.1 g/dL   Albumin 1.7 (L) 3.5 - 5.0 g/dL   AST 21 15 - 41 U/L   ALT 19 17 - 63 U/L   Alkaline Phosphatase 72 38 - 126 U/L   Total Bilirubin 0.5 0.3 - 1.2 mg/dL   GFR calc non Af Amer >60 >60 mL/min   GFR calc Af Amer >60 >60 mL/min    Comment: (NOTE) The eGFR has been calculated using the CKD EPI equation. This calculation has not been validated in all clinical situations. eGFR's persistently <60 mL/min signify possible Chronic Kidney Disease.    Anion gap 3 (L) 5 - 15  Type and screen [facility]     Status: None   Collection Time: 02/28/16  7:19 AM  Result Value Ref Range   ABO/RH(D) O POS    Antibody Screen NEG    Sample Expiration 03/02/2016    Unit Number [id number]    Blood Component Type RBC LR  PHER1    Unit division 00    Status of Unit ISSUED,FINAL    Transfusion Status OK TO TRANSFUSE    Crossmatch Result Compatible    Unit Number [id number]    Blood Component Type RED CELLS,LR    Unit division 00    Status of Unit ISSUED,FINAL    Transfusion Status OK TO TRANSFUSE    Crossmatch Result Compatible   Prepare RBC     Status: None   Collection Time: 02/28/16  7:19 AM  Result Value Ref Range   Order Confirmation ORDER PROCESSED BY BLOOD BANK   ABO/Rh     Status: None   Collection Time: 02/28/16  7:19 AM  Result Value Ref Range   ABO/RH(D) O POS   Glucose, capillary     Status: Abnormal   Collection Time: 02/28/16  7:25 AM  Result Value Ref Range   Glucose-Capillary 219 (H) 65 - 99 mg/dL  Occult blood card to lab, stool     Status: Abnormal   Collection Time: 02/28/16 11:00 AM  Result Value Ref Range   Fecal Occult Bld POSITIVE (A) NEGATIVE  Glucose, capillary     Status: Abnormal   Collection Time: 02/28/16 11:34 AM  Result Value Ref Range   Glucose-Capillary 280 (H) 65 - 99 mg/dL  Glucose, capillary     Status: Abnormal   Collection Time: 02/28/16  3:52 PM  Result Value Ref Range   Glucose-Capillary 225 (H) 65 - 99 mg/dL  Glucose, capillary     Status: Abnormal   Collection Time: 02/28/16  7:31 PM  Result Value Ref Range   Glucose-Capillary 240 (H) 65 - 99 mg/dL   Comment 1 Notify RN   Basic metabolic panel     Status: Abnormal   Collection Time: 02/29/16  4:00 AM  Result Value Ref Range   Sodium 137 135 - 145 mmol/L   Potassium 3.5 3.5 - 5.1 mmol/L   Chloride 97 (L) 101 - 111 mmol/L   CO2 34 (H) 22 - 32 mmol/L   Glucose, Bld 247 (H) 65 - 99 mg/dL   BUN 16 6 - 20 mg/dL   Creatinine, Ser 0.59 (L) 0.61 - 1.24 mg/dL   Calcium 7.2 (L) 8.9 - 10.3 mg/dL   GFR calc non Af Amer >60 >60 mL/min   GFR calc Af Amer >60 >60  mL/min    Comment: (NOTE) The eGFR has been calculated using the CKD EPI equation. This calculation has not been validated in all clinical  situations. eGFR's persistently <60 mL/min signify possible Chronic Kidney Disease.    Anion gap 6 5 - 15  CBC with Differential/Platelet     Status: Abnormal   Collection Time: 02/29/16  4:00 AM  Result Value Ref Range   WBC 15.3 (H) 4.0 - 10.5 K/uL   RBC 2.82 (L) 4.22 - 5.81 MIL/uL   Hemoglobin 8.5 (L) 13.0 - 17.0 g/dL    Comment: DELTA CHECK NOTED   HCT 25.5 (L) 39.0 - 52.0 %   MCV 90.4 78.0 - 100.0 fL   MCH 30.1 26.0 - 34.0 pg   MCHC 33.3 30.0 - 36.0 g/dL   RDW 15.7 (H) 11.5 - 15.5 %   Platelets 241 150 - 400 K/uL   Neutrophils Relative % 90 %   Neutro Abs 13.7 (H) 1.7 - 7.7 K/uL   Lymphocytes Relative 5 %   Lymphs Abs 0.8 0.7 - 4.0 K/uL   Monocytes Relative 5 %   Monocytes Absolute 0.8 0.1 - 1.0 K/uL   Eosinophils Relative 0 %   Eosinophils Absolute 0.0 0.0 - 0.7 K/uL   Basophils Relative 0 %   Basophils Absolute 0.0 0.0 - 0.1 K/uL  Glucose, capillary     Status: Abnormal   Collection Time: 02/29/16  7:27 AM  Result Value Ref Range   Glucose-Capillary 227 (H) 65 - 99 mg/dL   Comment 1 Notify RN    Comment 2 Document in Chart     ABGS No results for input(s): PHART, PO2ART, TCO2, HCO3 in the last 72 hours.  Invalid input(s): PCO2 CULTURES Recent Results (from the past 240 hour(s))  MRSA PCR Screening     Status: Abnormal   Collection Time: 02/19/16  4:47 PM  Result Value Ref Range Status   MRSA by PCR POSITIVE (A) NEGATIVE Final    Comment:        The GeneXpert MRSA Assay (FDA approved for NASAL specimens only), is one component of a comprehensive MRSA colonization surveillance program. It is not intended to diagnose MRSA infection nor to guide or monitor treatment for MRSA infections. CRITICAL RESULT CALLED TO, READ BACK BY AND VERIFIED WITH: PARIS,C AT 2342 ON 02/19/2016 BY WOODS,M    Studies/Results: Dg Chest Port 1 View  Result Date: 02/27/2016 CLINICAL DATA:  Pneumonia. EXAM: PORTABLE CHEST 1 VIEW COMPARISON:  02/26/2016. FINDINGS: Stable  enlarged cardiac silhouette no significant change in a moderate to large-sized right pleural effusion. The left lung remains clear. The left PICC tip remains in the region of the origin of the superior vena cava. Unremarkable bones. IMPRESSION: 1. Stable moderate to large-sized right pleural effusion and cardiomegaly. 2. Left PICC tip at the origin of the superior vena cava. Electronically Signed   By: Claudie Revering M.D.   On: 02/27/2016 10:17    Medications:  Prior to Admission:  Prescriptions Prior to Admission  Medication Sig Dispense Refill Last Dose  . AMITIZA 24 MCG capsule Take 2 capsules by mouth daily.   02/17/2016 at Unknown time  . Aspirin-Salicylamide-Caffeine (BC HEADACHE POWDER PO) Take 1 Package by mouth daily as needed (pain).   02/17/2016 at Unknown time  . atorvastatin (LIPITOR) 10 MG tablet Take 10 mg by mouth daily.    02/17/2016 at Unknown time  . benzonatate (TESSALON) 200 MG capsule Take 200 mg by mouth 3 (three) times daily  as needed for cough.   unknown  . furosemide (LASIX) 40 MG tablet Take 1 tablet (40 mg total) by mouth 2 (two) times daily. (Patient taking differently: Take 40 mg by mouth daily. ) 4 tablet 0 02/17/2016 at Unknown time  . HYDROcodone-acetaminophen (NORCO) 7.5-325 MG tablet Take 1 tablet by mouth every 6 (six) hours as needed for moderate pain (Must last 30 days.Do not drive or operate machinery while taking this medicine.). 180 tablet 0 unknown  . LORazepam (ATIVAN) 2 MG tablet Take 2 mg by mouth at bedtime.   02/17/2016 at Unknown time  . losartan (COZAAR) 50 MG tablet Take 50 mg by mouth daily.   02/17/2016 at Unknown time  . metFORMIN (GLUCOPHAGE) 500 MG tablet Take 1 tablet by mouth 2 (two) times daily.   02/17/2016 at Unknown time  . mirtazapine (REMERON) 15 MG tablet Take 15 mg by mouth at bedtime.    02/17/2016 at Unknown time  . potassium chloride SA (K-DUR,KLOR-CON) 20 MEQ tablet Take 20 mEq by mouth daily.    02/17/2016 at Unknown time  .  pregabalin (LYRICA) 150 MG capsule Take 150 mg by mouth 2 (two) times daily.   02/17/2016 at Unknown time  . traZODone (DESYREL) 50 MG tablet Take 2 tablets by mouth at bedtime.    02/17/2016 at Unknown time   Scheduled: . sodium chloride   Intravenous Once  . albuterol  2.5 mg Nebulization TID  . furosemide  40 mg Intravenous Q12H  . imipenem-cilastatin  500 mg Intravenous Q6H  . insulin aspart  0-20 Units Subcutaneous TID AC & HS  . insulin glargine  35 Units Subcutaneous Daily  . LORazepam  1 mg Oral BID  . losartan  50 mg Oral Daily  . methylPREDNISolone (SOLU-MEDROL) injection  40 mg Intravenous Q12H  . mirtazapine  15 mg Oral QHS  . pantoprazole (PROTONIX) IV  40 mg Intravenous Q12H  . potassium chloride  40 mEq Oral BID  . pregabalin  150 mg Oral BID  . sodium chloride flush  3 mL Intravenous Q12H  . traZODone  100 mg Oral QHS  . vancomycin  1,250 mg Intravenous Q8H   Continuous: . sodium chloride 75 mL/hr at 02/18/16 1425  . sodium chloride 50 mL/hr at 02/29/16 0322  . fentaNYL infusion INTRAVENOUS 50 mcg/hr (02/23/16 0830)   CYD:TFMYNCDDB, alum & mag hydroxide-simeth, HYDROcodone-acetaminophen, ondansetron **OR** ondansetron (ZOFRAN) IV, senna-docusate  Assesment: He was admitted with cavitary pneumonia and sepsis. He has required intubation and chemical ventilation for hypoxemic respiratory failure. He's been off the ventilator now for about 5 days. He is improving as far as his respiratory status is concerned. He is using BiPAP at night. He has what appears to be a right pleural effusion and is scheduled for thoracentesis later today. This will be after CT scan to document whether this is fluid, pleural thickening etc.  He has GI bleeding. At home he was taking apparently at least 10 BCs every day. He's not had any more bleeding. He is hemodynamically stable. Hemoglobin 8.5 today. He is on twice a day Protonix  He was septic on admission and that has improved.  He is  volume overloaded from volume resuscitation for sepsis he is on IV Lasix and that is improving  He has protein calorie malnutrition with his albumin being 1.7 yesterday. He's back on clear liquids for now. This is because of his GI bleeding  He is diabetic and his blood sugars doing a little bit better.  I adjusted his insulin yesterday.  He has morbid obesity unchanged  He is very weak and deconditioned and is going to require rehabilitation at discharge Principal Problem:   Cavitary pneumonia Active Problems:   Hemoptysis   Morbid obesity ([place])   Pneumatosis intestinalis   Pressure injury of skin   Type II diabetes mellitus, uncontrolled (HCC)   Sepsis (HCC)   Elevated troponin level   Acute respiratory failure with hypoxia (HCC)   Encephalopathy, metabolic   Acute upper GI bleeding   Acute blood loss anemia   Protein-calorie malnutrition (HCC)    Plan: Repeat hemoglobin and hematocrit later today. Continue his treatments. Thoracentesis later today. CT of the chest today. EGD when he is better. No opportunity to move from stepdown unit    LOS: 11 days   Benjamin Casanas L 02/29/2016, 7:42 AM

## 2016-02-29 NOTE — Clinical Social Work Note (Signed)
CSW contacted patient's spouse and caregiver and received no answer.    Yael Angerer, Clydene Pugh, LCSW

## 2016-03-01 ENCOUNTER — Ambulatory Visit: Payer: Medicare Other | Admitting: Orthopaedic Surgery

## 2016-03-01 DIAGNOSIS — D62 Acute posthemorrhagic anemia: Secondary | ICD-10-CM

## 2016-03-01 LAB — CBC WITH DIFFERENTIAL/PLATELET
BASOS ABS: 0 10*3/uL (ref 0.0–0.1)
BASOS PCT: 0 %
EOS ABS: 0 10*3/uL (ref 0.0–0.7)
Eosinophils Relative: 0 %
HCT: 25.2 % — ABNORMAL LOW (ref 39.0–52.0)
HEMOGLOBIN: 8.1 g/dL — AB (ref 13.0–17.0)
Lymphocytes Relative: 6 %
Lymphs Abs: 0.9 10*3/uL (ref 0.7–4.0)
MCH: 29.7 pg (ref 26.0–34.0)
MCHC: 32.1 g/dL (ref 30.0–36.0)
MCV: 92.3 fL (ref 78.0–100.0)
Monocytes Absolute: 0.7 10*3/uL (ref 0.1–1.0)
Monocytes Relative: 5 %
NEUTROS PCT: 89 %
Neutro Abs: 12.2 10*3/uL — ABNORMAL HIGH (ref 1.7–7.7)
Platelets: 240 10*3/uL (ref 150–400)
RBC: 2.73 MIL/uL — AB (ref 4.22–5.81)
RDW: 15.8 % — ABNORMAL HIGH (ref 11.5–15.5)
WBC: 13.8 10*3/uL — AB (ref 4.0–10.5)

## 2016-03-01 LAB — GLUCOSE, CAPILLARY
GLUCOSE-CAPILLARY: 294 mg/dL — AB (ref 65–99)
GLUCOSE-CAPILLARY: 265 mg/dL — AB (ref 65–99)
Glucose-Capillary: 245 mg/dL — ABNORMAL HIGH (ref 65–99)
Glucose-Capillary: 283 mg/dL — ABNORMAL HIGH (ref 65–99)

## 2016-03-01 LAB — BASIC METABOLIC PANEL
Anion gap: 3 — ABNORMAL LOW (ref 5–15)
BUN: 15 mg/dL (ref 6–20)
CALCIUM: 7.2 mg/dL — AB (ref 8.9–10.3)
CO2: 35 mmol/L — ABNORMAL HIGH (ref 22–32)
CREATININE: 0.51 mg/dL — AB (ref 0.61–1.24)
Chloride: 98 mmol/L — ABNORMAL LOW (ref 101–111)
GFR calc non Af Amer: >60 mL/min (ref >60)
Glucose, Bld: 254 mg/dL — ABNORMAL HIGH (ref 65–99)
Potassium: 4 mmol/L (ref 3.5–5.1)
SODIUM: 136 mmol/L (ref 135–145)

## 2016-03-01 LAB — PH, BODY FLUID: pH, Body Fluid: 8

## 2016-03-01 LAB — VANCOMYCIN, TROUGH: VANCOMYCIN TR: 18 ug/mL (ref 15–20)

## 2016-03-01 MED ORDER — LEVOFLOXACIN 500 MG PO TABS
500.0000 mg | ORAL_TABLET | Freq: Every day | ORAL | Status: DC
  Administered 2016-03-01 – 2016-03-06 (×5): 500 mg via ORAL
  Filled 2016-03-01 (×5): qty 1

## 2016-03-01 MED ORDER — PREDNISONE 20 MG PO TABS
40.0000 mg | ORAL_TABLET | Freq: Every day | ORAL | Status: DC
  Administered 2016-03-01 – 2016-03-02 (×2): 40 mg via ORAL
  Filled 2016-03-01 (×2): qty 2

## 2016-03-01 NOTE — Care Management Note (Signed)
Case Management Note  Patient Details  Name: Richard Fields MRN: [id number] Date of Birth: [birth year]  If discussed at Long Length of Stay Meetings, dates discussed:  03/01/2016   Sherald Barge, RN 03/01/2016, 11:50 AM

## 2016-03-01 NOTE — Progress Notes (Signed)
Subjective: He remains in stepdown. He had thoracentesis yesterday with production of 800 mL of bloody material. He says he feels better. He is on nasal oxygen. Denies chest pain nausea vomiting diarrhea and he's not had any more bleeding noted. He is still very weak.  Objective: Vital signs in last 24 hours: Temp:  [97 F (36.1 C)-98.8 F (37.1 C)] 97 F (36.1 C) (10/31 0741) Pulse Rate:  [83-128] 99 (10/31 0000) Resp:  [17-36] 21 (10/31 0000) BP: (102-126)/(59-69) 114/69 (10/31 0000) SpO2:  [92 %-97 %] 97 % (10/31 0818) Weight:  [141.9 kg (312 lb 13.3 oz)] 141.9 kg (312 lb 13.3 oz) (10/31 0500) Weight change: -4.9 kg (-10 lb 12.8 oz) Last BM Date: 02/27/16  Intake/Output from previous day: 10/30 0701 - 10/31 0700 In: 3461 [P.O.:1080; I.V.:1331; IV Piggyback:1050] Out: 6150 [Urine:6150]  PHYSICAL EXAM General appearance: alert, cooperative, mild distress and morbidly obese Resp: His chest is significantly clearer than before he still has diminished breath sounds on the right Cardio: regular rate and rhythm, S1, S2 normal, no murmur, click, rub or gallop GI: soft, non-tender; bowel sounds normal; no masses,  no organomegaly Extremities: He has generalized puffiness of the extremities but it is getting better Skin warm and dry but puffy. Mucous membranes are moist.  Lab Results:  Results for orders placed or performed during the hospital encounter of 02/18/16 (from the past 48 hour(s))  Occult blood card to lab, stool     Status: Abnormal   Collection Time: 02/28/16 11:00 AM  Result Value Ref Range   Fecal Occult Bld POSITIVE (A) NEGATIVE  Glucose, capillary     Status: Abnormal   Collection Time: 02/28/16 11:34 AM  Result Value Ref Range   Glucose-Capillary 280 (H) 65 - 99 mg/dL  Glucose, capillary     Status: Abnormal   Collection Time: 02/28/16  3:52 PM  Result Value Ref Range   Glucose-Capillary 225 (H) 65 - 99 mg/dL  Glucose, capillary     Status: Abnormal   Collection Time: 02/28/16  7:31 PM  Result Value Ref Range   Glucose-Capillary 240 (H) 65 - 99 mg/dL   Comment 1 Notify RN   Basic metabolic panel     Status: Abnormal   Collection Time: 02/29/16  4:00 AM  Result Value Ref Range   Sodium 137 135 - 145 mmol/L   Potassium 3.5 3.5 - 5.1 mmol/L   Chloride 97 (L) 101 - 111 mmol/L   CO2 34 (H) 22 - 32 mmol/L   Glucose, Bld 247 (H) 65 - 99 mg/dL   BUN 16 6 - 20 mg/dL   Creatinine, Ser 0.59 (L) 0.61 - 1.24 mg/dL   Calcium 7.2 (L) 8.9 - 10.3 mg/dL   GFR calc non Af Amer >60 >60 mL/min   GFR calc Af Amer >60 >60 mL/min    Comment: (NOTE) The eGFR has been calculated using the CKD EPI equation. This calculation has not been validated in all clinical situations. eGFR's persistently <60 mL/min signify possible Chronic Kidney Disease.    Anion gap 6 5 - 15  CBC with Differential/Platelet     Status: Abnormal   Collection Time: 02/29/16  4:00 AM  Result Value Ref Range   WBC 15.3 (H) 4.0 - 10.5 K/uL   RBC 2.82 (L) 4.22 - 5.81 MIL/uL   Hemoglobin 8.5 (L) 13.0 - 17.0 g/dL    Comment: DELTA CHECK NOTED   HCT 25.5 (L) 39.0 - 52.0 %   MCV 90.4 78.0 -  100.0 fL   MCH 30.1 26.0 - 34.0 pg   MCHC 33.3 30.0 - 36.0 g/dL   RDW 15.7 (H) 11.5 - 15.5 %   Platelets 241 150 - 400 K/uL   Neutrophils Relative % 90 %   Neutro Abs 13.7 (H) 1.7 - 7.7 K/uL   Lymphocytes Relative 5 %   Lymphs Abs 0.8 0.7 - 4.0 K/uL   Monocytes Relative 5 %   Monocytes Absolute 0.8 0.1 - 1.0 K/uL   Eosinophils Relative 0 %   Eosinophils Absolute 0.0 0.0 - 0.7 K/uL   Basophils Relative 0 %   Basophils Absolute 0.0 0.0 - 0.1 K/uL  Glucose, capillary     Status: Abnormal   Collection Time: 02/29/16  7:27 AM  Result Value Ref Range   Glucose-Capillary 227 (H) 65 - 99 mg/dL   Comment 1 Notify RN    Comment 2 Document in Chart   Glucose, capillary     Status: Abnormal   Collection Time: 02/29/16 11:19 AM  Result Value Ref Range   Glucose-Capillary 278 (H) 65 - 99 mg/dL    Comment 1 Notify RN    Comment 2 Document in Chart   Body fluid cell count with differential     Status: Abnormal   Collection Time: 02/29/16  2:44 PM  Result Value Ref Range   Fluid Type-FCT FLUID     Comment: RIGHT PLEURAL CORRECTED ON 10/30 AT 1522: PREVIOUSLY REPORTED AS Body Fluid    Color, Fluid RED (A) YELLOW   Appearance, Fluid CLOUDY (A) CLEAR   WBC, Fluid 2,818 (H) 0 - 1,000 cu mm   Neutrophil Count, Fluid 52 (H) 0 - 25 %   Lymphs, Fluid 39 %   Monocyte-Macrophage-Serous Fluid 9 (L) 50 - 90 %   Eos, Fluid 0 %   Other Cells, Fluid RARE %    Comment: OTHER CELLS IDENTIFIED AS MESOTHELIAL CELLS CYTOLOGY TO FOLLOW.   Glucose, pleural or peritoneal fluid     Status: None   Collection Time: 02/29/16  2:44 PM  Result Value Ref Range   Glucose, Fluid 237 mg/dL    Comment: (NOTE) No normal range established for this test Results should be evaluated in conjunction with serum values    Fluid Type-FGLU FLUID     Comment: RIGHT PLEURAL CORRECTED ON 10/30 AT 1522: PREVIOUSLY REPORTED AS Pleural Fld   Protein, fluid - pleural or peritoneal     Status: None   Collection Time: 02/29/16  2:44 PM  Result Value Ref Range   Total protein, fluid 2.7 g/dL    Comment: (NOTE) No normal range established for this test Results should be evaluated in conjunction with serum values    Fluid Type-FTP FLUID     Comment: RIGHT PLEURAL CORRECTED ON 10/30 AT 1525: PREVIOUSLY REPORTED AS Pleural Fld   Gram stain     Status: None   Collection Time: 02/29/16  2:44 PM  Result Value Ref Range   Specimen Description FLUID RIGHT PLEURAL COLLECTED BY DOCTOR    Special Requests NONE    Gram Stain      MODERATE WBC PRESENT,BOTH PMN AND MONONUCLEAR NO ORGANISMS SEEN Performed at [facility]    Report Status 02/29/2016 FINAL   Culture, body fluid-bottle     Status: None (Preliminary result)   Collection Time: 02/29/16  2:44 PM  Result Value Ref Range   Specimen Description FLUID RIGHT  PLEURAL COLLECTED BY DOCTOR    Special Requests BOTTLES DRAWN AEROBIC  AND ANAEROBIC 10CC    Culture NO GROWTH < 24 HOURS    Report Status PENDING   Hemoglobin and hematocrit, blood     Status: Abnormal   Collection Time: 02/29/16  3:57 PM  Result Value Ref Range   Hemoglobin 8.6 (L) 13.0 - 17.0 g/dL   HCT 26.5 (L) 39.0 - 52.0 %  Glucose, capillary     Status: Abnormal   Collection Time: 02/29/16  4:21 PM  Result Value Ref Range   Glucose-Capillary 214 (H) 65 - 99 mg/dL   Comment 1 Notify RN    Comment 2 Document in Chart   Glucose, capillary     Status: Abnormal   Collection Time: 02/29/16  9:12 PM  Result Value Ref Range   Glucose-Capillary 240 (H) 65 - 99 mg/dL  Basic metabolic panel     Status: Abnormal   Collection Time: 03/01/16  3:57 AM  Result Value Ref Range   Sodium 136 135 - 145 mmol/L   Potassium 4.0 3.5 - 5.1 mmol/L   Chloride 98 (L) 101 - 111 mmol/L   CO2 35 (H) 22 - 32 mmol/L   Glucose, Bld 254 (H) 65 - 99 mg/dL   BUN 15 6 - 20 mg/dL   Creatinine, Ser 0.51 (L) 0.61 - 1.24 mg/dL   Calcium 7.2 (L) 8.9 - 10.3 mg/dL   GFR calc non Af Amer >60 >60 mL/min   GFR calc Af Amer >60 >60 mL/min    Comment: (NOTE) The eGFR has been calculated using the CKD EPI equation. This calculation has not been validated in all clinical situations. eGFR's persistently <60 mL/min signify possible Chronic Kidney Disease.    Anion gap 3 (L) 5 - 15  CBC with Differential/Platelet     Status: Abnormal   Collection Time: 03/01/16  3:57 AM  Result Value Ref Range   WBC 13.8 (H) 4.0 - 10.5 K/uL   RBC 2.73 (L) 4.22 - 5.81 MIL/uL   Hemoglobin 8.1 (L) 13.0 - 17.0 g/dL   HCT 25.2 (L) 39.0 - 52.0 %   MCV 92.3 78.0 - 100.0 fL   MCH 29.7 26.0 - 34.0 pg   MCHC 32.1 30.0 - 36.0 g/dL   RDW 15.8 (H) 11.5 - 15.5 %   Platelets 240 150 - 400 K/uL   Neutrophils Relative % 89 %   Neutro Abs 12.2 (H) 1.7 - 7.7 K/uL   Lymphocytes Relative 6 %   Lymphs Abs 0.9 0.7 - 4.0 K/uL   Monocytes Relative 5 %    Monocytes Absolute 0.7 0.1 - 1.0 K/uL   Eosinophils Relative 0 %   Eosinophils Absolute 0.0 0.0 - 0.7 K/uL   Basophils Relative 0 %   Basophils Absolute 0.0 0.0 - 0.1 K/uL  Glucose, capillary     Status: Abnormal   Collection Time: 03/01/16  7:28 AM  Result Value Ref Range   Glucose-Capillary 294 (H) 65 - 99 mg/dL   Comment 1 Notify RN    Comment 2 Document in Chart   Vancomycin, trough     Status: None   Collection Time: 03/01/16  8:12 AM  Result Value Ref Range   Vancomycin Tr 18 15 - 20 ug/mL    ABGS No results for input(s): PHART, PO2ART, TCO2, HCO3 in the last 72 hours.  Invalid input(s): PCO2 CULTURES Recent Results (from the past 240 hour(s))  Gram stain     Status: None   Collection Time: 02/29/16  2:44 PM  Result Value Ref  Range Status   Specimen Description FLUID RIGHT PLEURAL COLLECTED BY DOCTOR  Final   Special Requests NONE  Final   Gram Stain   Final    MODERATE WBC PRESENT,BOTH PMN AND MONONUCLEAR NO ORGANISMS SEEN Performed at [facility]    Report Status 02/29/2016 FINAL  Final  Culture, body fluid-bottle     Status: None (Preliminary result)   Collection Time: 02/29/16  2:44 PM  Result Value Ref Range Status   Specimen Description FLUID RIGHT PLEURAL COLLECTED BY DOCTOR  Final   Special Requests BOTTLES DRAWN AEROBIC AND ANAEROBIC 10CC  Final   Culture NO GROWTH < 24 HOURS  Final   Report Status PENDING  Incomplete   Studies/Results: Ct Chest W Contrast  Result Date: 02/29/2016 CLINICAL DATA:  Pneumonia, intubated EXAM: CT CHEST WITH CONTRAST TECHNIQUE: Multidetector CT imaging of the chest was performed during intravenous contrast administration. CONTRAST:  89m ISOVUE-300 IOPAMIDOL (ISOVUE-300) INJECTION 61% COMPARISON:  02/17/2016 FINDINGS: Cardiovascular: No significant vascular findings. Normal heart size. No pericardial effusion. Coronary artery atherosclerosis in the LAD and circumflex. Mediastinum/Nodes: No enlarged mediastinal, hilar,  or axillary lymph nodes. Thyroid gland, trachea, and esophagus demonstrate no significant findings. Lungs/Pleura: Large right pleural effusion is severe compressive atelectasis with only a small area of lung at the apex. No left pleural effusion. No pneumothorax. Upper Abdomen: No acute upper abdominal abnormality. Musculoskeletal: No acute osseous abnormality. No lytic or sclerotic osseous lesion. IMPRESSION: 1. Large right pleural effusion with severe compressive atelectasis with only a small area of aerated lung at the apex. 2. Coronary artery atherosclerosis. Electronically Signed   By: HKathreen Devoid  On: 02/29/2016 13:12   Dg Chest Port 1 View  Result Date: 02/29/2016 CLINICAL DATA:  RIGHT pleural effusion post thoracentesis EXAM: PORTABLE CHEST 1 VIEW COMPARISON:  Portable exam 1521 hours compared to 02/27/2016 chest radiograph and CT chest of 02/29/2016 FINDINGS: Enlargement of cardiac silhouette with vascular congestion. Persistent moderate-sized RIGHT pleural effusion despite thoracentesis. No pneumothorax. Compressive atelectasis of RIGHT middle and RIGHT lower lobes. LEFT lung grossly clear. Osseous structures unremarkable. IMPRESSION: No pneumothorax following RIGHT thoracentesis. Persistent RIGHT pleural effusion with significant atelectasis of the lower RIGHT lung. Enlargement of cardiac silhouette with pulmonary vascular congestion. Electronically Signed   By: MLavonia DanaM.D.   On: 02/29/2016 15:38   UKoreaThoracentesis Asp Pleural Space W/img Guide  Result Date: 02/29/2016 INDICATION: RIGHT pleural effusion EXAM: ULTRASOUND GUIDED RIGHT THORACENTESIS MEDICATIONS: None. COMPLICATIONS: None immediate. PROCEDURE: Procedure, benefits, and risks of procedure were discussed with patient. Written informed consent for procedure was obtained. Time out protocol followed. Pleural effusion localized by ultrasound at the posterior RIGHT hemithorax. Skin prepped and draped in usual sterile fashion. Skin  and soft tissues anesthetized with 10 mL of 1% lidocaine. 8 French thoracentesis catheter placed into the RIGHT pleural space. 800 mL of dark old bloody RIGHT pleural aspirated by syringe pump. Procedure tolerated well by patient without immediate complication. FINDINGS: A total of approximately 800 ml of RIGHT pleural fluid was removed. Samples were sent to the laboratory as requested by the clinical team. IMPRESSION: Successful ultrasound guided RIGHT thoracentesis yielding 800 mL of pleural fluid. Electronically Signed   By: MLavonia DanaM.D.   On: 02/29/2016 15:18    Medications:  Prior to Admission:  Prescriptions Prior to Admission  Medication Sig Dispense Refill Last Dose  . AMITIZA 24 MCG capsule Take 2 capsules by mouth daily.   02/17/2016 at Unknown time  . Aspirin-Salicylamide-Caffeine (  BC HEADACHE POWDER PO) Take 1 Package by mouth daily as needed (pain).   02/17/2016 at Unknown time  . atorvastatin (LIPITOR) 10 MG tablet Take 10 mg by mouth daily.    02/17/2016 at Unknown time  . benzonatate (TESSALON) 200 MG capsule Take 200 mg by mouth 3 (three) times daily as needed for cough.   unknown  . furosemide (LASIX) 40 MG tablet Take 1 tablet (40 mg total) by mouth 2 (two) times daily. (Patient taking differently: Take 40 mg by mouth daily. ) 4 tablet 0 02/17/2016 at Unknown time  . HYDROcodone-acetaminophen (NORCO) 7.5-325 MG tablet Take 1 tablet by mouth every 6 (six) hours as needed for moderate pain (Must last 30 days.Do not drive or operate machinery while taking this medicine.). 180 tablet 0 unknown  . LORazepam (ATIVAN) 2 MG tablet Take 2 mg by mouth at bedtime.   02/17/2016 at Unknown time  . losartan (COZAAR) 50 MG tablet Take 50 mg by mouth daily.   02/17/2016 at Unknown time  . metFORMIN (GLUCOPHAGE) 500 MG tablet Take 1 tablet by mouth 2 (two) times daily.   02/17/2016 at Unknown time  . mirtazapine (REMERON) 15 MG tablet Take 15 mg by mouth at bedtime.    02/17/2016 at Unknown  time  . potassium chloride SA (K-DUR,KLOR-CON) 20 MEQ tablet Take 20 mEq by mouth daily.    02/17/2016 at Unknown time  . pregabalin (LYRICA) 150 MG capsule Take 150 mg by mouth 2 (two) times daily.   02/17/2016 at Unknown time  . traZODone (DESYREL) 50 MG tablet Take 2 tablets by mouth at bedtime.    02/17/2016 at Unknown time   Scheduled: . sodium chloride   Intravenous Once  . albuterol  2.5 mg Nebulization TID  . furosemide  40 mg Intravenous Q12H  . insulin aspart  0-20 Units Subcutaneous TID AC & HS  . insulin glargine  35 Units Subcutaneous Daily  . levofloxacin  500 mg Oral Daily  . LORazepam  1 mg Oral BID  . losartan  50 mg Oral Daily  . mirtazapine  15 mg Oral QHS  . pantoprazole (PROTONIX) IV  40 mg Intravenous Q12H  . potassium chloride  40 mEq Oral BID  . predniSONE  40 mg Oral Q breakfast  . pregabalin  150 mg Oral BID  . sodium chloride flush  3 mL Intravenous Q12H  . traZODone  100 mg Oral QHS   Continuous: . sodium chloride 75 mL/hr at 02/18/16 1425  . sodium chloride 50 mL/hr at 03/01/16 0216  . fentaNYL infusion INTRAVENOUS 50 mcg/hr (02/23/16 0830)   NFA:OZHYQMVHQ, alum & mag hydroxide-simeth, HYDROcodone-acetaminophen, ondansetron **OR** ondansetron (ZOFRAN) IV, senna-docusate  Assesment: He was admitted with cavitary pneumonia. He is improving. Since she did not show evidence of empyema on thoracentesis I'm going to go ahead and discontinue his IV antibiotics. He's been on steroids and I'm going to discontinue steroids. He was septic on admission and that is much improved and resolved. He had significant problems with a metabolic encephalopathy on admission and that has resolved. He is still extremely weak and is going to require rehabilitation at discharge. He had GI bleeding received 2 units of packed red blood cells and is being followed by GI. No further bleeding has been noted. He had extensive nonsteroidal use as an outpatient Principal Problem:   Cavitary  pneumonia Active Problems:   Hemoptysis   Morbid obesity ([place])   Pneumatosis intestinalis   Pressure injury of skin  Type II diabetes mellitus, uncontrolled (HCC)   Sepsis ([place])   Elevated troponin level   Acute respiratory failure with hypoxia (HCC)   Encephalopathy, metabolic   Acute upper GI bleeding   Acute blood loss anemia   Protein-calorie malnutrition ([facility])    Plan: Discontinue IV steroids. Discontinue IV antibiotics and Levaquin. Add prednisone. Continue other treatments.    LOS: 12 days   Harlin Mazzoni L 03/01/2016, 9:09 AM

## 2016-03-01 NOTE — Progress Notes (Signed)
Inpatient Diabetes Program Recommendations  AACE/ADA: New Consensus Statement on Inpatient Glycemic Control (2015)  Target Ranges:  Prepandial:   less than 140 mg/dL      Peak postprandial:   less than 180 mg/dL (1-2 hours)      Critically ill patients:  140 - 180 mg/dL   Results for DEMAURY, BARAHONA (MRN [id number]) as of 03/01/2016 12:58  Ref. Range 02/29/2016 07:27 02/29/2016 11:19 02/29/2016 16:21 02/29/2016 21:12  Glucose-Capillary Latest Ref Range: 65 - 99 mg/dL 227 (H) 278 (H) 214 (H) 240 (H)   Results for KHYLAR, KEPPLE (MRN [id number]) as of 03/01/2016 12:58  Ref. Range 03/01/2016 07:28 03/01/2016 11:39  Glucose-Capillary Latest Ref Range: 65 - 99 mg/dL 294 (H) 245 (H)     Outpatient DM meds: Metformin 500 mg BID  Current Insulin Orders: Lantus 35 units daily      Novolog 0-20 units ACHS      MD- Note last dose Solumedrol given today at 8:30am.  Patient now started on Prednisone 40 mg daily.  Please consider starting Novolog Meal Coverage for this patient: Novolog 4 units TIDWC (hold if pt eats <50% of meal)     --Will follow patient during hospitalization--  Wyn Quaker RN, MSN, CDE Diabetes Coordinator Inpatient Glycemic Control Team Team Pager: [phone] (8a-5p)

## 2016-03-01 NOTE — Progress Notes (Signed)
Physical Therapy Treatment Patient Details Name: Richard Fields MRN: [id number] DOB: [birth year] Today's Date: 03/01/2016    History of Present Illness 75 y.o. male with a past medical history significant for DM, HLD, HTN, and peripheral neuropathy, who presents by ambulance with complaints of blackish/brownish hemoptysis that onset 1 month ago. Very poor historian. H/o mainly obtained by caregiver and wife at bedside. Per wife he has associated right shoulder pain and profuse diaphoresis overnight. He denies any fever or chills. While in the ED, he was started on vancomycin and admitted for further evaluation of cavitary pneumonia.  CT Chest: RLL ATX surrounding a non-enhancing cavitary lesion, likely a cavitary PNA. CTA Abdomen: pneumatosis of the transverse colon and a segment of the descending colon    PT Comments    Pt received in bed, and was agreeable to PT tx.  RN's had expressed to therapist prior to tx, that when he was transferred bed<>chair via lift last visit, he only last ~1 hr in the chair due to fatigue and pt starting to lean - therefore they used the maxi sky lift to transfer him back to the bed.  Pt was much more alert and able to participate in therapy session today.  He was able to complete all bed level exercises with need for assist on some to ensure proper technique.  Bed was transitioned slightly into chair position to assist with obtaining upright posture and working on core stability.  Continue to recommend SNF.    Follow Up Recommendations  SNF     Equipment Recommendations  None recommended by PT    Recommendations for Other Services       Precautions / Restrictions Precautions Precautions: Fall Precaution Comments: Due to current immobility Restrictions Weight Bearing Restrictions: No    Mobility  Bed Mobility                  Transfers                    Ambulation/Gait                 Stairs            Wheelchair  Mobility    Modified Rankin (Stroke Patients Only)       Balance                                    Cognition Arousal/Alertness: Awake/alert (much more conversive today, making jokes, and able to follow conversation. ) Behavior During Therapy: WFL for tasks assessed/performed Overall Cognitive Status: Within Functional Limits for tasks assessed                      Exercises General Exercises - Upper Extremity Shoulder Flexion: Strengthening;Both;10 reps;Supine Elbow Extension: Strengthening;Both;10 reps;Supine General Exercises - Lower Extremity Ankle Circles/Pumps: Both;10 reps;Supine Ankle Circles/Pumps Limitations: propped on pillow Short Arc Quad: Strengthening;Both;10 reps;Supine Heel Slides: Strengthening;Both;10 reps;Supine Heel Slides Limitations: Assist to maintain proper hip alingnment.  Pt has a tendancy to keep hips in externally rotated position.  Hip ABduction/ADduction: Strengthening;Both;10 reps;Supine Hip Abduction/Adduction Limitations: Assist to maintain proper hip alingnment.  Pt has a tendancy to keep hips in externally rotated position.     General Comments        Pertinent Vitals/Pain Pain Assessment: No/denies pain    Home Living  Prior Function            PT Goals (current goals can now be found in the care plan section) Acute Rehab PT Goals Patient Stated Goal: Pt stated he wants to be a participant PT Goal Formulation: With patient Time For Goal Achievement: 03/09/16 Potential to Achieve Goals: Fair Progress towards PT goals: Progressing toward goals    Frequency    Min 5X/week      PT Plan Current plan remains appropriate    Co-evaluation             End of Session Equipment Utilized During Treatment: Oxygen Activity Tolerance: Patient tolerated treatment well Patient left: in bed;with nursing/sitter in room;with call bell/phone within reach;with family/visitor  present     Time: [id number] PT Time Calculation (min) (ACUTE ONLY): 32 min  Charges:  $Therapeutic Exercise: 23-37 mins                    G Codes:      Beth Lucyle Alumbaugh, PT, DPT X: [phone]

## 2016-03-01 NOTE — Procedures (Signed)
Placed patient on BIPAP for the night.  Patient is tolerating well at this time. 

## 2016-03-01 NOTE — Progress Notes (Signed)
Subjective: No overt GI bleeding. No N/V. No abdominal pain. Patient feels breathing is better. Per nursing staff, patient sat up in chair yesterday but requires a lift to get in the chair. Sat in chair approximately an hour, then "slumps" down and has to be returned to the bed.   Objective: Vital signs in last 24 hours: Temp:  [97 F (36.1 C)-98.8 F (37.1 C)] 97 F (36.1 C) (10/31 0741) Pulse Rate:  [83-128] 99 (10/31 0000) Resp:  [17-36] 21 (10/31 0000) BP: (102-148)/(59-81) 114/69 (10/31 0000) SpO2:  [91 %-97 %] 97 % (10/31 0000) Weight:  [312 lb 13.3 oz (141.9 kg)] 312 lb 13.3 oz (141.9 kg) (10/31 0500) Last BM Date: 02/27/16 General:   Alert and oriented to person and place.  Lungs: scattered rhonchi bilaterally  Abdomen:  Bowel sounds present, soft, non-tender, non-distended. No HSM or hernias noted. No rebound or guarding.  Extremities:  1+ lower extremity edema, pedal edema noted  Neurologic:  Alert and  oriented to person, place, time.  Psych:  Alert and cooperative.   Intake/Output from previous day: 10/30 0701 - 10/31 0700 In: 3461 [P.O.:1080; I.V.:1331; IV Piggyback:1050] Out: [id number] [Urine:6150] Intake/Output this shift: No intake/output data recorded.  Lab Results:  Recent Labs  02/28/16 0349 02/29/16 0400 02/29/16 1557 03/01/16 0357  WBC 17.2* 15.3*  --  13.8*  HGB 6.4* 8.5* 8.6* 8.1*  HCT 19.9* 25.5* 26.5* 25.2*  PLT 212 241  --  240   BMET  Recent Labs  02/28/16 0349 02/29/16 0400 03/01/16 0357  NA 141 137 136  K 3.6 3.5 4.0  CL 104 97* 98*  CO2 34* 34* 35*  GLUCOSE 230* 247* 254*  BUN 19 16 15   CREATININE 0.62 0.59* 0.51*  CALCIUM 7.2* 7.2* 7.2*   LFT  Recent Labs  02/28/16 0349  PROT 4.5*  ALBUMIN 1.7*  AST 21  ALT 19  ALKPHOS 72  BILITOT 0.5     Studies/Results: Ct Chest W Contrast  Result Date: 02/29/2016 CLINICAL DATA:  Pneumonia, intubated EXAM: CT CHEST WITH CONTRAST TECHNIQUE: Multidetector CT imaging of the  chest was performed during intravenous contrast administration. CONTRAST:  60mL ISOVUE-300 IOPAMIDOL (ISOVUE-300) INJECTION 61% COMPARISON:  02/17/2016 FINDINGS: Cardiovascular: No significant vascular findings. Normal heart size. No pericardial effusion. Coronary artery atherosclerosis in the LAD and circumflex. Mediastinum/Nodes: No enlarged mediastinal, hilar, or axillary lymph nodes. Thyroid gland, trachea, and esophagus demonstrate no significant findings. Lungs/Pleura: Large right pleural effusion is severe compressive atelectasis with only a small area of lung at the apex. No left pleural effusion. No pneumothorax. Upper Abdomen: No acute upper abdominal abnormality. Musculoskeletal: No acute osseous abnormality. No lytic or sclerotic osseous lesion. IMPRESSION: 1. Large right pleural effusion with severe compressive atelectasis with only a small area of aerated lung at the apex. 2. Coronary artery atherosclerosis. Electronically Signed   By: Kathreen Devoid   On: 02/29/2016 13:12   Dg Chest Port 1 View  Result Date: 02/29/2016 CLINICAL DATA:  RIGHT pleural effusion post thoracentesis EXAM: PORTABLE CHEST 1 VIEW COMPARISON:  Portable exam 1521 hours compared to 02/27/2016 chest radiograph and CT chest of 02/29/2016 FINDINGS: Enlargement of cardiac silhouette with vascular congestion. Persistent moderate-sized RIGHT pleural effusion despite thoracentesis. No pneumothorax. Compressive atelectasis of RIGHT middle and RIGHT lower lobes. LEFT lung grossly clear. Osseous structures unremarkable. IMPRESSION: No pneumothorax following RIGHT thoracentesis. Persistent RIGHT pleural effusion with significant atelectasis of the lower RIGHT lung. Enlargement of cardiac silhouette with pulmonary vascular congestion. Electronically  Signed   By: Lavonia Dana M.D.   On: 02/29/2016 15:38   US Thoracentesis Asp Pleural Space W/img Guide  Result Date: 02/29/2016 INDICATION: RIGHT pleural effusion EXAM: ULTRASOUND GUIDED  RIGHT THORACENTESIS MEDICATIONS: None. COMPLICATIONS: None immediate. PROCEDURE: Procedure, benefits, and risks of procedure were discussed with patient. Written informed consent for procedure was obtained. Time out protocol followed. Pleural effusion localized by ultrasound at the posterior RIGHT hemithorax. Skin prepped and draped in usual sterile fashion. Skin and soft tissues anesthetized with 10 mL of 1% lidocaine. 8 French thoracentesis catheter placed into the RIGHT pleural space. 800 mL of dark old bloody RIGHT pleural aspirated by syringe pump. Procedure tolerated well by patient without immediate complication. FINDINGS: A total of approximately 800 ml of RIGHT pleural fluid was removed. Samples were sent to the laboratory as requested by the clinical team. IMPRESSION: Successful ultrasound guided RIGHT thoracentesis yielding 800 mL of pleural fluid. Electronically Signed   By: Lavonia Dana M.D.   On: 02/29/2016 15:18    Assessment: 75 year old male admitted with cavitary pneumonia, pleural effusion, requiring intubation (now extubated) and noted to have melena and acute drop in Hgb while inpatient with most likely differentials to include NSAID-induced gastropathy, PUD. Hgb improved after 2 units PRBCs. No further overt GI bleeding. Thoracentesis completed 10/30 with 800 ml removed. Patient feels breathing is better; he has been able to sit up in the chair for approximately an hour at a time and requires assistance of the lift to get from bed to chair. Although he is improving, would favor waiting towards latter part of week for EGD, Thursday or Friday,  to allow continued improvement from respiratory standpoint.    Plan: Continue to monitor for overt GI bleeding Follow H/H EGD anticipated Thursday/Friday Protonix BID Will continue to follow with you  Annitta Needs, ANP-BC [facility]    LOS: 12 days    03/01/2016, 7:58 AM

## 2016-03-02 DIAGNOSIS — K269 Duodenal ulcer, unspecified as acute or chronic, without hemorrhage or perforation: Secondary | ICD-10-CM

## 2016-03-02 HISTORY — DX: Duodenal ulcer, unspecified as acute or chronic, without hemorrhage or perforation: K26.9

## 2016-03-02 LAB — CBC WITH DIFFERENTIAL/PLATELET
BASOS PCT: 0 %
Basophils Absolute: 0 10*3/uL (ref 0.0–0.1)
EOS PCT: 0 %
Eosinophils Absolute: 0 10*3/uL (ref 0.0–0.7)
HEMATOCRIT: 24.9 % — AB (ref 39.0–52.0)
HEMOGLOBIN: 8 g/dL — AB (ref 13.0–17.0)
Lymphocytes Relative: 14 %
Lymphs Abs: 1.8 10*3/uL (ref 0.7–4.0)
MCH: 29.9 pg (ref 26.0–34.0)
MCHC: 32.1 g/dL (ref 30.0–36.0)
MCV: 92.9 fL (ref 78.0–100.0)
MONO ABS: 1 10*3/uL (ref 0.1–1.0)
Monocytes Relative: 8 %
NEUTROS PCT: 78 %
Neutro Abs: 9.8 10*3/uL — ABNORMAL HIGH (ref 1.7–7.7)
Platelets: 251 10*3/uL (ref 150–400)
RBC: 2.68 MIL/uL — ABNORMAL LOW (ref 4.22–5.81)
RDW: 15.9 % — AB (ref 11.5–15.5)
WBC: 12.6 10*3/uL — ABNORMAL HIGH (ref 4.0–10.5)

## 2016-03-02 LAB — GLUCOSE, CAPILLARY
GLUCOSE-CAPILLARY: 180 mg/dL — AB (ref 65–99)
GLUCOSE-CAPILLARY: 343 mg/dL — AB (ref 65–99)
GLUCOSE-CAPILLARY: 254 mg/dL — AB (ref 65–99)
Glucose-Capillary: 131 mg/dL — ABNORMAL HIGH (ref 65–99)

## 2016-03-02 LAB — BASIC METABOLIC PANEL
Anion gap: 5 (ref 5–15)
BUN: 11 mg/dL (ref 6–20)
CHLORIDE: 97 mmol/L — AB (ref 101–111)
CO2: 36 mmol/L — AB (ref 22–32)
CREATININE: 0.51 mg/dL — AB (ref 0.61–1.24)
Calcium: 7.5 mg/dL — ABNORMAL LOW (ref 8.9–10.3)
GFR calc non Af Amer: >60 mL/min (ref >60)
Glucose, Bld: 165 mg/dL — ABNORMAL HIGH (ref 65–99)
Potassium: 3.2 mmol/L — ABNORMAL LOW (ref 3.5–5.1)
Sodium: 138 mmol/L (ref 135–145)

## 2016-03-02 MED ORDER — INSULIN GLARGINE 100 UNIT/ML ~~LOC~~ SOLN
40.0000 [IU] | Freq: Every day | SUBCUTANEOUS | Status: DC
  Administered 2016-03-02 – 2016-03-05 (×3): 40 [IU] via SUBCUTANEOUS
  Filled 2016-03-02 (×6): qty 0.4

## 2016-03-02 MED ORDER — POTASSIUM CHLORIDE CRYS ER 20 MEQ PO TBCR
40.0000 meq | EXTENDED_RELEASE_TABLET | Freq: Three times a day (TID) | ORAL | Status: DC
  Administered 2016-03-02 – 2016-03-03 (×6): 40 meq via ORAL
  Filled 2016-03-02 (×6): qty 2

## 2016-03-02 NOTE — Progress Notes (Signed)
Physical Therapy Treatment Patient Details Name: Richard Fields MRN: [id number] DOB: 1940/08/14 Today's Date: 03/02/2016    History of Present Illness 75 y.o. male with a past medical history significant for DM, HLD, HTN, and peripheral neuropathy, who presents by ambulance with complaints of blackish/brownish hemoptysis that onset 1 month ago. Very poor historian. H/o mainly obtained by caregiver and wife at bedside. Per wife he has associated right shoulder pain and profuse diaphoresis overnight. He denies any fever or chills. While in the ED, he was started on vancomycin and admitted for further evaluation of cavitary pneumonia.  CT Chest: RLL ATX surrounding a non-enhancing cavitary lesion, likely a cavitary PNA. CTA Abdomen: pneumatosis of the transverse colon and a segment of the descending colon    PT Comments    Pt awake, alert and willing to participate with therapy.  Pt c/o generalized pain scale 8/10, RN aware.  Upon entrance noted pt has slide down bed to Rt side.  Rolling complete with 3+ personal to improve alignment/comfort in bed with reports of pain reduced.  AROM LE and posture strengthening exercises with therapist facilitation to assure correct form and technique with therex.  Pt with tendency to ER BLE, cueing to inform form for maximal benefits in bed.  EOS bed transitioned into slight chair position to assist with obtaining upright posture and working on core stability with pillow placed under UE.  Pt reports pain reduced.  Pt left in bed with call bell within reach and RN currently in room.    Follow Up Recommendations        Equipment Recommendations       Recommendations for Other Services       Precautions / Restrictions Precautions Precautions: Fall Precaution Comments: Due to current immobility Restrictions Weight Bearing Restrictions: No    Mobility  Bed Mobility Overal bed mobility: +2 for physical assistance;Needs Assistance Bed Mobility:  Rolling Rolling: Total assist;+2 for physical assistance            Transfers                    Ambulation/Gait                 Stairs            Wheelchair Mobility    Modified Rankin (Stroke Patients Only)       Balance                                    Cognition Arousal/Alertness: Awake/alert Behavior During Therapy: WFL for tasks assessed/performed Overall Cognitive Status: Within Functional Limits for tasks assessed                      Exercises General Exercises - Upper Extremity Shoulder Flexion: Strengthening;Both;10 reps;Supine Elbow Extension: Strengthening;Both;10 reps;Supine General Exercises - Lower Extremity Ankle Circles/Pumps: Both;20 reps;Supine Quad Sets: AAROM;Both;10 reps Short Arc Quad: Strengthening;Both;10 reps;Supine Heel Slides: Strengthening;Both;10 reps;Supine Heel Slides Limitations: Assist to maintain proper hip alingnment.  Pt has a tendancy to keep hips in externally rotated position.  Hip ABduction/ADduction: Strengthening;Both;10 reps;Supine Hip Abduction/Adduction Limitations: Assist to maintain proper hip alingnment.  Pt has a tendancy to keep hips in externally rotated position.     General Comments        Pertinent Vitals/Pain Pain Assessment: 0-10 Pain Score: 8  Pain Location: Generalized Pain Descriptors / Indicators: Sore;Aching Pain Intervention(s):  Limited activity within patient's tolerance    Home Living                      Prior Function            PT Goals (current goals can now be found in the care plan section)      Frequency    Min 5X/week      PT Plan Current plan remains appropriate    Co-evaluation             End of Session Equipment Utilized During Treatment: Oxygen Activity Tolerance: Patient tolerated treatment well Patient left: in bed;with nursing/sitter in room;with call bell/phone within reach;with family/visitor  present     Time: 1000-1023 PT Time Calculation (min) (ACUTE ONLY): 23 min  Charges:  $Therapeutic Exercise: 8-22 mins $Therapeutic Activity: 8-22 mins                    G Codes:     Ihor Austin, LPTA; [place]   Aldona Lento 03/02/2016, 10:42 AM

## 2016-03-02 NOTE — Progress Notes (Signed)
Subjective:  Says his breathing is better. Very weak. Doesn't like clear liquids but doesn't have appetite either. No overt GI bleeding. No abd pain.   Objective: Vital signs in last 24 hours: Temp:  [97.4 F (36.3 C)-98.1 F (36.7 C)] 98 F (36.7 C) (11/01 0400) Pulse Rate:  [64-109] 109 (11/01 0629) Resp:  [18-26] 19 (11/01 0629) BP: (88-150)/(44-135) 94/54 (11/01 0629) SpO2:  [89 %-98 %] 95 % (11/01 0629) FiO2 (%):  [40 %] 40 % (11/01 0308) Weight:  [312 lb 13.3 oz (141.9 kg)] 312 lb 13.3 oz (141.9 kg) (11/01 0400) Last BM Date: 02/27/16 General:   Alert,  pleasant and cooperative in NAD Head:  Normocephalic and atraumatic. Eyes:  Sclera clear, no icterus.  Chest: scattered rhonchi bilaterally. Abdomen:  Soft, obese, nontender. Normal bowel sounds, without guarding, and without rebound.   Extremities:  Without clubbing, deformity. 1+ pedal edema bilaterally. Neurologic:  Alert and  oriented x4;  grossly normal neurologically. Skin:  Intact without significant lesions or rashes. Psych:  Alert and cooperative. Normal mood and affect.  Intake/Output from previous day: 10/31 0701 - 11/01 0700 In: 2400 [P.O.:1200; I.V.:1200] Out: 6850 [Urine:6850] Intake/Output this shift: No intake/output data recorded.  Lab Results: CBC  Recent Labs  02/29/16 0400 02/29/16 1557 03/01/16 0357 03/02/16 0357  WBC 15.3*  --  13.8* 12.6*  HGB 8.5* 8.6* 8.1* 8.0*  HCT 25.5* 26.5* 25.2* 24.9*  MCV 90.4  --  92.3 92.9  PLT 241  --  240 251   BMET  Recent Labs  02/29/16 0400 03/01/16 0357 03/02/16 0357  NA 137 136 138  K 3.5 4.0 3.2*  CL 97* 98* 97*  CO2 34* 35* 36*  GLUCOSE 247* 254* 165*  BUN 16 15 11   CREATININE 0.59* 0.51* 0.51*  CALCIUM 7.2* 7.2* 7.5*   LFTs No results for input(s): BILITOT, BILIDIR, IBILI, ALKPHOS, AST, ALT, PROT, ALBUMIN in the last 72 hours. No results for input(s): LIPASE in the last 72 hours. PT/INR No results for input(s): LABPROT, INR in the last  72 hours.    Imaging Studies: Dg Chest 1 View  Result Date: 02/19/2016 CLINICAL DATA:  Shortness of breath for several weeks EXAM: CHEST 1 VIEW COMPARISON:  02/18/2016 FINDINGS: Cardiac shadow is enlarged. Right-sided pleural effusion is increasing from the prior exam. No focal infiltrate is seen in the left lung. No bony abnormality is noted. IMPRESSION: Increasing right-sided pleural effusion. Electronically Signed   By: Inez Catalina M.D.   On: 02/19/2016 09:28   Ct Chest W Contrast  Result Date: 02/29/2016 CLINICAL DATA:  Pneumonia, intubated EXAM: CT CHEST WITH CONTRAST TECHNIQUE: Multidetector CT imaging of the chest was performed during intravenous contrast administration. CONTRAST:  31mL ISOVUE-300 IOPAMIDOL (ISOVUE-300) INJECTION 61% COMPARISON:  02/17/2016 FINDINGS: Cardiovascular: No significant vascular findings. Normal heart size. No pericardial effusion. Coronary artery atherosclerosis in the LAD and circumflex. Mediastinum/Nodes: No enlarged mediastinal, hilar, or axillary lymph nodes. Thyroid gland, trachea, and esophagus demonstrate no significant findings. Lungs/Pleura: Large right pleural effusion is severe compressive atelectasis with only a small area of lung at the apex. No left pleural effusion. No pneumothorax. Upper Abdomen: No acute upper abdominal abnormality. Musculoskeletal: No acute osseous abnormality. No lytic or sclerotic osseous lesion. IMPRESSION: 1. Large right pleural effusion with severe compressive atelectasis with only a small area of aerated lung at the apex. 2. Coronary artery atherosclerosis. Electronically Signed   By: Kathreen Devoid   On: 02/29/2016 13:12   Ct Angio Chest Pe  W/cm &/or Wo Cm  Result Date: 02/18/2016 CLINICAL DATA:  Hemoptysis for 2-3 days.  Back pain. EXAM: CT ANGIOGRAPHY CHEST WITH CONTRAST TECHNIQUE: Multidetector CT imaging of the chest was performed using the standard protocol during bolus administration of intravenous contrast.  Multiplanar CT image reconstructions and MIPs were obtained to evaluate the vascular anatomy. CONTRAST:  100 cc Isovue 370 intravenous COMPARISON:  None. FINDINGS: Cardiovascular: CTA of the pulmonary arteries is limited by motion and suboptimal opacification. Apparent filling defect in a subsegmental left upper lobe branch, 6:121, is an area of motion and favored artifactual. The low-density is also not at a branch point. No filling defect is identified. No cardiomegaly or pericardial effusion. No acute aortic finding. Diffuse atherosclerotic calcification of the aorta and coronaries. Mediastinum/Nodes: Prominent right paratracheal lymph node of 11 mm short axis, incidental in isolation. Lungs/Pleura: There is atelectasis at the right base, multi segment, with a ~ 3 cm nonenhancing area in the lower lobe that has central lucency. There is small right pleural effusion that is small, with mild posterior chest loculation. No edema. Upper Abdomen: Hepatic steatosis Musculoskeletal: Degenerative changes without acute or aggressive finding. Review of the MIP images confirms the above findings. IMPRESSION: 1. Limited CTA with no definitive pulmonary embolism. There is an apparent filling defect within a subsegmental left upper lobe branch, but favored artifactual. Consider lower extremity Dopplers. 2. Right lower lobe atelectasis surrounding a nonenhancing cavitary focus, likely a cavitary pneumonia. Right pleural effusion is small but has areas of early loculation. 3. After completed treatment, recommend CT follow-up. Electronically Signed   By: Monte Fantasia M.D.   On: 02/18/2016 07:44   [place] Chest  Result Date: 02/19/2016 CLINICAL DATA:  Abnormal chest radiograph, RIGHT pleural effusion EXAM: CHEST ULTRASOUND COMPARISON:  Chest radiograph 02/19/2016 FINDINGS: Only a minimal amount of RIGHT pleural fluid is identified. Consolidated lower RIGHT lung is seen. Volume of fluid visualized is insufficient for  thoracentesis. IMPRESSION: RIGHT lower lobe consolidation. Minimal RIGHT pleural effusion. Discussed with Dr. Luan Pulling. Electronically Signed   By: Lavonia Dana M.D.   On: 02/19/2016 14:07   Dg Chest Port 1 View  Result Date: 02/29/2016 CLINICAL DATA:  RIGHT pleural effusion post thoracentesis EXAM: PORTABLE CHEST 1 VIEW COMPARISON:  Portable exam 1521 hours compared to 02/27/2016 chest radiograph and CT chest of 02/29/2016 FINDINGS: Enlargement of cardiac silhouette with vascular congestion. Persistent moderate-sized RIGHT pleural effusion despite thoracentesis. No pneumothorax. Compressive atelectasis of RIGHT middle and RIGHT lower lobes. LEFT lung grossly clear. Osseous structures unremarkable. IMPRESSION: No pneumothorax following RIGHT thoracentesis. Persistent RIGHT pleural effusion with significant atelectasis of the lower RIGHT lung. Enlargement of cardiac silhouette with pulmonary vascular congestion. Electronically Signed   By: Lavonia Dana M.D.   On: 02/29/2016 15:38   Dg Chest Port 1 View  Result Date: 02/27/2016 CLINICAL DATA:  Pneumonia. EXAM: PORTABLE CHEST 1 VIEW COMPARISON:  02/26/2016. FINDINGS: Stable enlarged cardiac silhouette no significant change in a moderate to large-sized right pleural effusion. The left lung remains clear. The left PICC tip remains in the region of the origin of the superior vena cava. Unremarkable bones. IMPRESSION: 1. Stable moderate to large-sized right pleural effusion and cardiomegaly. 2. Left PICC tip at the origin of the superior vena cava. Electronically Signed   By: Claudie Revering M.D.   On: 02/27/2016 10:17   Dg Chest Port 1 View  Result Date: 02/26/2016 CLINICAL DATA:  Pt couldn't verbally explain reason for exam. Tech and nurse techs assisted to try  and stable pt upright to get sufficient AP view of chest. Reason for exam is pneumonia. EXAM: PORTABLE CHEST 1 VIEW COMPARISON:  02/22/2016 FINDINGS: Opacity at the right lung base has increased  consistent with enlargement of the right pleural effusion, now moderate to large in size. No convincing left pleural effusion. No pneumothorax. No evidence of pulmonary edema.  No definite pneumonia. Cardiac silhouette is enlarged but stable. Left PICC has its tip at the left brachiocephalic vein superior vena cava confluence. IMPRESSION: 1. Increased right pleural effusion, now moderate to large. 2. Convincing pneumonia.  No pulmonary edema. Electronically Signed   By: Lajean Manes M.D.   On: 02/26/2016 10:04   Dg Chest Port 1 View  Result Date: 02/22/2016 CLINICAL DATA:  Respiratory failure. EXAM: PORTABLE CHEST 1 VIEW COMPARISON:  Radiographs of February 21, 2016. FINDINGS: Stable cardiomediastinal silhouette. Endotracheal and nasogastric tubes are in grossly good position. No pneumothorax is noted. Stable moderate right pleural effusion is noted with probable underlying atelectasis or infiltrate. Stable central pulmonary vascular congestion. Stable position of left subclavian catheter with distal tip in expected position of the SVC. Bony thorax is unremarkable. Stable minimal left basilar subsegmental atelectasis. IMPRESSION: Stable minimal left basilar subsegmental atelectasis. Stable moderate right pleural effusion with probable underlying atelectasis or infiltrate. Support apparatus in grossly good position. Electronically Signed   By: Marijo Conception, M.D.   On: 02/22/2016 10:09   Dg Chest Port 1 View  Result Date: 02/21/2016 CLINICAL DATA:  Respiratory failure EXAM: PORTABLE CHEST 1 VIEW COMPARISON:  02/20/2016 FINDINGS: Cardiomegaly again noted. Stable NG tube and endotracheal tube position. Central vascular congestion. Persistent right pleural effusion with atelectasis or infiltrate in right lower lobe. Small left pleural effusion with left basilar atelectasis. No convincing pulmonary edema. Left arm PICC line is unchanged in position. IMPRESSION: Stable NG tube and endotracheal tube position.  Central vascular congestion. Persistent right pleural effusion with atelectasis or infiltrate in right lower lobe. Small left pleural effusion with left basilar atelectasis. No convincing pulmonary edema. Electronically Signed   By: Lahoma Crocker M.D.   On: 02/21/2016 09:33   Dg Chest Port 1 View  Result Date: 02/20/2016 CLINICAL DATA:  Respiratory failure.  Evaluate endotracheal tube. EXAM: PORTABLE CHEST 1 VIEW COMPARISON:  02/19/2016. FINDINGS: Endotracheal tube is 5.6 cm above the carina and appropriately positioned. A nasogastric tube extends into the abdomen. Lower chest is not completely visualized. Patient rotated towards the right. Left lung appears clear. There continues to be right pleural fluid which is probably loculated. Persistent densities at the right chest base could represent atelectasis. Heart size is grossly stable. IMPRESSION: Endotracheal tube is appropriately positioned above the carina. Persistent pleural-parenchymal disease in the right lung with volume loss. Electronically Signed   By: Markus Daft M.D.   On: 02/20/2016 11:11   Dg Chest Port 1 View  Result Date: 02/18/2016 CLINICAL DATA:  75 year old male with shortness of breath and hemoptysis. EXAM: PORTABLE CHEST 1 VIEW COMPARISON:  Chest radiograph dated 08/20/2014 FINDINGS: There is shallow inspiration. Mild diffuse interstitial prominence and crowding as well as mild bronchiectatic changes. Right lung base hazy density likely atelectatic changes. Developing infiltrate is not excluded. Clinical correlation is recommended. There is no pleural effusion or pneumothorax. Stable mild cardiomegaly. No acute osseous pathology. IMPRESSION: Right lung base atelectatic changes versus less likely infiltrate. Clinical correlation is recommended. Electronically Signed   By: Anner Crete M.D.   On: 02/18/2016 06:23   Dg Chest Port 1v Same Day  Result Date: 02/20/2016 CLINICAL DATA:  Status post PICC line placement. EXAM: PORTABLE  CHEST 1 VIEW COMPARISON:  02/20/2016 at 1050 hours FINDINGS: Endotracheal tube is 5 cm above the carina. Nasogastric tube extends to the abdomen but the tip is beyond the image. Again noted is right pleural fluid extending up the lateral aspect of the right chest. There continues to be consolidation and volume loss in the right lower lung. Stable enlargement of the cardiac silhouette. A left arm PICC line has been placed. Catheter tip has a horizontal orientation and probably at the junction of the upper SVC and left innominate vein. IMPRESSION: PICC line tip near the junction of the left innominate vein and SVC. Persistent pleural and parenchymal disease in the right chest. Minimal change from the recent comparison examination. Electronically Signed   By: Markus Daft M.D.   On: 02/20/2016 12:55   Dg Shoulder Right Portable  Result Date: 02/18/2016 CLINICAL DATA:  Initial evaluation for acute right shoulder pain. No injury. EXAM: PORTABLE RIGHT SHOULDER COMPARISON:  None. FINDINGS: No acute fracture or dislocation. Humeral head in normal alignment with the glenoid. AC joint approximated. Degenerative osteoarthritic changes present about the [facility] joint. No periarticular calcification. Osseous mineralization normal. No soft tissue abnormality. IMPRESSION: 1. No acute osseous abnormality about the right shoulder. 2. 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 Electronically Signed   By: Jeannine Boga M.D.   On: 02/18/2016 06:24   Ct Renal Stone Study  Result Date: 02/18/2016 CLINICAL DATA:  Acute right flank pain. EXAM: CT ABDOMEN AND PELVIS WITHOUT CONTRAST TECHNIQUE: Multidetector CT imaging of the abdomen and pelvis was performed following the standard protocol without IV contrast. However, exam is somewhat limited due to respiratory motion artifact. COMPARISON:  None. FINDINGS: Lower chest: Right posterior basilar atelectasis or inflammation is noted with possible  associated pleural effusion. Left lung base is clear. Hepatobiliary: No gallstones are noted. Possible mild fatty infiltration is noted of the liver. Pancreas: Normal. Spleen: Normal. Adrenals/Urinary Tract: Adrenal glands and kidneys are unremarkable. No hydronephrosis or renal obstruction is noted. No renal or ureteral calculi are noted. Urinary bladder appears normal. Stomach/Bowel: There is no evidence of bowel obstruction. However, there does appear to be pneumatosis involving a portion of the transverse colon as well as a portion of the proximal descending colon. This potentially may represent benign pneumatosis, but ischemic bowel must be considered. Vascular/Lymphatic: Atherosclerosis of abdominal aorta is noted without aneurysm formation. No significant adenopathy is noted. Reproductive: Normal prostate gland. Other: No abnormal fluid collection is noted. 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 Possible pars defects is seen at L5. Well-defined lucencies are noted in the L3 and L4 vertebral bodies which may represent hemangiomas, but metastatic disease cannot be excluded. IMPRESSION: No hydronephrosis or renal obstruction is noted. No renal or ureteral calculi are noted. Right posterior basilar atelectasis or inflammation is noted with possible associated pleural effusion. Probable mild fatty infiltration of the liver. Aortic atherosclerosis. Well-defined lucencies are noted in the L3 and L4 vertebral bodies which may represent hemangiomas, but MRI with and without gadolinium is recommended of the lumbar spine to rule out malignancy or metastatic disease. Pneumatosis is noted in the transverse colon as well as proximal descending colon. While this may represent benign pneumatosis, ischemic bowel cannot be excluded and clinical correlation and surgical consultation is recommended. Critical Value/emergent results were called by telephone at the time of  interpretation on 02/18/2016 at 7:24  am to Dr. Rolland Porter , who verbally acknowledged these results. Electronically Signed   By: Marijo Conception, M.D.   On: 02/18/2016 07:25   Ct Angio Abd/pel W And/or Wo Contrast  Addendum Date: 02/18/2016   ADDENDUM REPORT: 02/18/2016 12:41 ADDENDUM: Small right pleural effusion with compressive atelectasis at the right lung base. There is a small focus of low-density and gas within this collapsed right lower lobe. This is seen on sequence 5, image 9. This area measures roughly 2.2 cm. This could represent a small focus of lung necrosis or cavitary pneumonia. This was seen on the recent chest CT from 02/18/2016. Electronically Signed   By: Markus Daft M.D.   On: 02/18/2016 12:41   Result Date: 02/18/2016 CLINICAL DATA:  Follow-up colonic pneumatosis. EXAM: CT ANGIOGRAPHY ABDOMEN AND PELVIS WITH CONTRAST AND WITHOUT CONTRAST TECHNIQUE: Multidetector CT imaging of the abdomen and pelvis was performed using the standard protocol during bolus administration of intravenous contrast. Multiplanar reconstructed images and MIPs were obtained and reviewed to evaluate the vascular anatomy. CONTRAST:  143mL ISOVUE-300 IOPAMIDOL (ISOVUE-300) INJECTION 61% COMPARISON:  CT 02/18/2016 FINDINGS: VASCULAR Aorta: Poor opacification of the arterial structures due to technical difficulties with this examination. Normal caliber of the abdominal aorta with atherosclerotic disease. No evidence for an aortic dissection. Celiac: Celiac trunk is patent without significant stent stenosis at the origin. The main branch vessels are patent. SMA: The SMA is patent with some atherosclerotic plaque at the origin. There does not appear to be a critical stenosis but limited evaluation. Renals: Bilateral renal arteries are patent with calcified plaque at the origin. There does not appear to be critical stenosis but limited evaluation. IMA: Proximal IMA appears to be patent. Inflow: Iliac arteries are calcified  without significant aneurysm. Iliac arteries and proximal femoral arteries appear to be patent. Veins: IVC, renal veins and portal venous system are patent. Review of the MIP images confirms the above findings. NON-VASCULAR Lower chest: Again noted is atelectasis and/or scarring at the left lung base. Small right pleural effusion with compressive atelectasis in the right lower lobe. Coronary artery calcifications. Hepatobiliary: Normal appearance of the liver, gallbladder and portal venous system. Pancreas: Normal appearance of the pancreas without inflammation or duct dilatation. Spleen: Normal appearance of spleen without enlargement. Adrenals/Urinary Tract: Normal adrenal glands. Probable cyst in the left kidney interpolar region without hydronephrosis. Normal appearance of the right kidney without hydronephrosis. Moderate distention of the urinary bladder without gross abnormality. Stomach/Bowel: Again noted is pneumatosis involving the transverse colon and a segment of the descending colon. There is no significant colonic wall thickening. No evidence for small bowel dilatation or obstruction. Cecum is located in the right upper abdomen and difficult to exclude some pneumatosis involving the cecum. Lymphatic: No significant lymphadenopathy in the abdomen or pelvis. Reproductive: Prostate is unremarkable. Other: No evidence for abdominal or pelvic ascites. There is no free intraperitoneal air. No evidence for portal gas. 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 There is a large lucency involving the L4 vertebral body which may be related to a Schmorl's node. There is also large low-density structure in the posterior aspect of the L3 vertebral body which could be related to a large Schmorl's node. IMPRESSION: VASCULAR Limited evaluation of the aorta and visceral arteries due to poor contrast opacification and technical issues with this examination. No gross abnormality  to the aorta such as an aneurysm or dissection. Main visceral arteries appear to be patent without significant  stenosis. NON-VASCULAR Stable colonic pneumatosis. The pneumatosis is involving the transverse colon and a segment of the descending colon. No significant change from the previous examination and etiology is unknown. No focal bowel wall thickening. No free intraperitoneal air. Again noted are lucent lesions within the L3 and L4 vertebral bodies. These could be related to very large Schmorl's nodes but indeterminate. Electronically Signed: By: Markus Daft M.D. On: 02/18/2016 12:22   US Thoracentesis Asp Pleural Space W/img Guide  Result Date: 02/29/2016 INDICATION: RIGHT pleural effusion EXAM: ULTRASOUND GUIDED RIGHT THORACENTESIS MEDICATIONS: None. COMPLICATIONS: None immediate. PROCEDURE: Procedure, benefits, and risks of procedure were discussed with patient. Written informed consent for procedure was obtained. Time out protocol followed. Pleural effusion localized by ultrasound at the posterior RIGHT hemithorax. Skin prepped and draped in usual sterile fashion. Skin and soft tissues anesthetized with 10 mL of 1% lidocaine. 8 French thoracentesis catheter placed into the RIGHT pleural space. 800 mL of dark old bloody RIGHT pleural aspirated by syringe pump. Procedure tolerated well by patient without immediate complication. FINDINGS: A total of approximately 800 ml of RIGHT pleural fluid was removed. Samples were sent to the laboratory as requested by the clinical team. IMPRESSION: Successful ultrasound guided RIGHT thoracentesis yielding 800 mL of pleural fluid. Electronically Signed   By: Lavonia Dana M.D.   On: 02/29/2016 15:18  [2 weeks]   Assessment: 75 year old male admitted with cavitary pneumonia, pleural effusion, requiring intubation (now extubated)and noted to have melena and acute drop in Hgb while inpatient with most likely differentials to include NSAID-induced gastropathy, PUD. Hgb  improved after 2 units PRBCs. No further overt GI bleeding. Thoracentesis completed 10/30 with 800 ml removed but persistent moderate sized right pleural effusion post thoracentesis. Patient feels breathing is better; he has been able to sit up in the chair for approximately an hour at a time and requires assistance of the lift to get from bed to chair. Per wife, patient was driving one month ago. Given stable Hgb and no further overt GI bleeding, would favor waiting towards latter part of week for EGD or just prior to discharge to allow continued improvement from respiratory standpoint.    Plan: 1. Continue PPI BID.  2. Continue to monitor for overt GI bleeding and/or drop in Hgb.  3. EGD latter part of week or just prior to discharge based on ongoing clinical improvement.  4. Will continue to follow with you.   Laureen Ochs. Bernarda Caffey [facility] [phone] 11/1/20179:48 AM     LOS: 13 days

## 2016-03-02 NOTE — Progress Notes (Signed)
Subjective: He says he feels better. He has no new complaints. He says his breathing is okay. He wore BiPAP last night. He denies chest pain. No nausea or vomiting. He is still very weak. No more bleeding.  Objective: Vital signs in last 24 hours: Temp:  [97.4 F (36.3 C)-98.1 F (36.7 C)] 98 F (36.7 C) (11/01 0400) Pulse Rate:  [64-109] 67 (11/01 0800) Resp:  [18-26] 20 (11/01 0800) BP: (88-130)/(44-80) 107/50 (11/01 0800) SpO2:  [89 %-98 %] 95 % (11/01 0800) FiO2 (%):  [40 %] 40 % (11/01 0308) Weight:  [141.9 kg (312 lb 13.3 oz)] 141.9 kg (312 lb 13.3 oz) (11/01 0400) Weight change: 0 kg (0 lb) Last BM Date: 02/27/16  Intake/Output from previous day: 10/31 0701 - 11/01 0700 In: 2400 [P.O.:1200; I.V.:1200] Out: 6850 [Urine:6850]  PHYSICAL EXAM General appearance: alert, cooperative, mild distress and morbidly obese Resp: rhonchi bilaterally Cardio: regular rate and rhythm, S1, S2 normal, no murmur, click, rub or gallop GI: soft, non-tender; bowel sounds normal; no masses,  no organomegaly Extremities: extremities normal, atraumatic, no cyanosis or edema He is morbidly obese. He has less edema now. His mucous membranes are moist.  Lab Results:  Results for orders placed or performed during the hospital encounter of 02/18/16 (from the past 48 hour(s))  Glucose, capillary     Status: Abnormal   Collection Time: 02/29/16 11:19 AM  Result Value Ref Range   Glucose-Capillary 278 (H) 65 - 99 mg/dL   Comment 1 Notify RN    Comment 2 Document in Chart   Body fluid cell count with differential     Status: Abnormal   Collection Time: 02/29/16  2:44 PM  Result Value Ref Range   Fluid Type-FCT FLUID     Comment: RIGHT PLEURAL CORRECTED ON 10/30 AT 1522: PREVIOUSLY REPORTED AS Body Fluid    Color, Fluid RED (A) YELLOW   Appearance, Fluid CLOUDY (A) CLEAR   WBC, Fluid 2,818 (H) 0 - 1,000 cu mm   Neutrophil Count, Fluid 52 (H) 0 - 25 %   Lymphs, Fluid 39 %   Monocyte-Macrophage-Serous Fluid 9 (L) 50 - 90 %   Eos, Fluid 0 %   Other Cells, Fluid RARE %    Comment: OTHER CELLS IDENTIFIED AS MESOTHELIAL CELLS CYTOLOGY TO FOLLOW.   Glucose, pleural or peritoneal fluid     Status: None   Collection Time: 02/29/16  2:44 PM  Result Value Ref Range   Glucose, Fluid 237 mg/dL    Comment: (NOTE) No normal range established for this test Results should be evaluated in conjunction with serum values    Fluid Type-FGLU FLUID     Comment: RIGHT PLEURAL CORRECTED ON 10/30 AT 1522: PREVIOUSLY REPORTED AS Pleural Fld   Protein, fluid - pleural or peritoneal     Status: None   Collection Time: 02/29/16  2:44 PM  Result Value Ref Range   Total protein, fluid 2.7 g/dL    Comment: (NOTE) No normal range established for this test Results should be evaluated in conjunction with serum values    Fluid Type-FTP FLUID     Comment: RIGHT PLEURAL CORRECTED ON 10/30 AT 1525: PREVIOUSLY REPORTED AS Pleural Fld   PH, Body Fluid     Status: None   Collection Time: 02/29/16  2:44 PM  Result Value Ref Range   pH, Body Fluid 8.0 Not Estab.    Comment: (NOTE) This test was developed and its performance characteristics determined by [facility]. It has not  been cleared or approved by the Food and Drug Administration. Performed At: [facility] [place], [place] [id number] Lindon Romp MD UD:[id number]    Source of Sample FLUID     Comment: RIGHT PLEURAL   Gram stain     Status: None   Collection Time: 02/29/16  2:44 PM  Result Value Ref Range   Specimen Description FLUID RIGHT PLEURAL COLLECTED BY DOCTOR    Special Requests NONE    Gram Stain      MODERATE WBC PRESENT,BOTH PMN AND MONONUCLEAR NO ORGANISMS SEEN Performed at [facility]    Report Status 02/29/2016 FINAL   Culture, body fluid-bottle     Status: None (Preliminary result)   Collection Time: 02/29/16  2:44 PM  Result Value Ref Range   Specimen Description  FLUID RIGHT PLEURAL COLLECTED BY DOCTOR    Special Requests BOTTLES DRAWN AEROBIC AND ANAEROBIC 10CC    Culture NO GROWTH < 24 HOURS    Report Status PENDING   Hemoglobin and hematocrit, blood     Status: Abnormal   Collection Time: 02/29/16  3:57 PM  Result Value Ref Range   Hemoglobin 8.6 (L) 13.0 - 17.0 g/dL   HCT 26.5 (L) 39.0 - 52.0 %  Glucose, capillary     Status: Abnormal   Collection Time: 02/29/16  4:21 PM  Result Value Ref Range   Glucose-Capillary 214 (H) 65 - 99 mg/dL   Comment 1 Notify RN    Comment 2 Document in Chart   Glucose, capillary     Status: Abnormal   Collection Time: 02/29/16  9:12 PM  Result Value Ref Range   Glucose-Capillary 240 (H) 65 - 99 mg/dL  Basic metabolic panel     Status: Abnormal   Collection Time: 03/01/16  3:57 AM  Result Value Ref Range   Sodium 136 135 - 145 mmol/L   Potassium 4.0 3.5 - 5.1 mmol/L   Chloride 98 (L) 101 - 111 mmol/L   CO2 35 (H) 22 - 32 mmol/L   Glucose, Bld 254 (H) 65 - 99 mg/dL   BUN 15 6 - 20 mg/dL   Creatinine, Ser 0.51 (L) 0.61 - 1.24 mg/dL   Calcium 7.2 (L) 8.9 - 10.3 mg/dL   GFR calc non Af Amer >60 >60 mL/min   GFR calc Af Amer >60 >60 mL/min    Comment: (NOTE) The eGFR has been calculated using the CKD EPI equation. This calculation has not been validated in all clinical situations. eGFR's persistently <60 mL/min signify possible Chronic Kidney Disease.    Anion gap 3 (L) 5 - 15  CBC with Differential/Platelet     Status: Abnormal   Collection Time: 03/01/16  3:57 AM  Result Value Ref Range   WBC 13.8 (H) 4.0 - 10.5 K/uL   RBC 2.73 (L) 4.22 - 5.81 MIL/uL   Hemoglobin 8.1 (L) 13.0 - 17.0 g/dL   HCT 25.2 (L) 39.0 - 52.0 %   MCV 92.3 78.0 - 100.0 fL   MCH 29.7 26.0 - 34.0 pg   MCHC 32.1 30.0 - 36.0 g/dL   RDW 15.8 (H) 11.5 - 15.5 %   Platelets 240 150 - 400 K/uL   Neutrophils Relative % 89 %   Neutro Abs 12.2 (H) 1.7 - 7.7 K/uL   Lymphocytes Relative 6 %   Lymphs Abs 0.9 0.7 - 4.0 K/uL   Monocytes  Relative 5 %   Monocytes Absolute 0.7 0.1 - 1.0 K/uL  Eosinophils Relative 0 %   Eosinophils Absolute 0.0 0.0 - 0.7 K/uL   Basophils Relative 0 %   Basophils Absolute 0.0 0.0 - 0.1 K/uL  Glucose, capillary     Status: Abnormal   Collection Time: 03/01/16  7:28 AM  Result Value Ref Range   Glucose-Capillary 294 (H) 65 - 99 mg/dL   Comment 1 Notify RN    Comment 2 Document in Chart   Vancomycin, trough     Status: None   Collection Time: 03/01/16  8:12 AM  Result Value Ref Range   Vancomycin Tr 18 15 - 20 ug/mL  Glucose, capillary     Status: Abnormal   Collection Time: 03/01/16 11:39 AM  Result Value Ref Range   Glucose-Capillary 245 (H) 65 - 99 mg/dL   Comment 1 Notify RN    Comment 2 Document in Chart   Glucose, capillary     Status: Abnormal   Collection Time: 03/01/16  4:10 PM  Result Value Ref Range   Glucose-Capillary 265 (H) 65 - 99 mg/dL   Comment 1 Notify RN    Comment 2 Document in Chart   Glucose, capillary     Status: Abnormal   Collection Time: 03/01/16  9:39 PM  Result Value Ref Range   Glucose-Capillary 283 (H) 65 - 99 mg/dL  Basic metabolic panel     Status: Abnormal   Collection Time: 03/02/16  3:57 AM  Result Value Ref Range   Sodium 138 135 - 145 mmol/L   Potassium 3.2 (L) 3.5 - 5.1 mmol/L    Comment: DELTA CHECK NOTED   Chloride 97 (L) 101 - 111 mmol/L   CO2 36 (H) 22 - 32 mmol/L   Glucose, Bld 165 (H) 65 - 99 mg/dL   BUN 11 6 - 20 mg/dL   Creatinine, Ser 0.51 (L) 0.61 - 1.24 mg/dL   Calcium 7.5 (L) 8.9 - 10.3 mg/dL   GFR calc non Af Amer >60 >60 mL/min   GFR calc Af Amer >60 >60 mL/min    Comment: (NOTE) The eGFR has been calculated using the CKD EPI equation. This calculation has not been validated in all clinical situations. eGFR's persistently <60 mL/min signify possible Chronic Kidney Disease.    Anion gap 5 5 - 15  CBC with Differential/Platelet     Status: Abnormal   Collection Time: 03/02/16  3:57 AM  Result Value Ref Range   WBC  12.6 (H) 4.0 - 10.5 K/uL    Comment: WHITE COUNT CONFIRMED ON SMEAR   RBC 2.68 (L) 4.22 - 5.81 MIL/uL   Hemoglobin 8.0 (L) 13.0 - 17.0 g/dL   HCT 24.9 (L) 39.0 - 52.0 %   MCV 92.9 78.0 - 100.0 fL   MCH 29.9 26.0 - 34.0 pg   MCHC 32.1 30.0 - 36.0 g/dL   RDW 15.9 (H) 11.5 - 15.5 %   Platelets 251 150 - 400 K/uL    Comment: SPECIMEN CHECKED FOR CLOTS PLATELET COUNT CONFIRMED BY SMEAR    Neutrophils Relative % 78 %   Lymphocytes Relative 14 %   Monocytes Relative 8 %   Eosinophils Relative 0 %   Basophils Relative 0 %   Neutro Abs 9.8 (H) 1.7 - 7.7 K/uL   Lymphs Abs 1.8 0.7 - 4.0 K/uL   Monocytes Absolute 1.0 0.1 - 1.0 K/uL   Eosinophils Absolute 0.0 0.0 - 0.7 K/uL   Basophils Absolute 0.0 0.0 - 0.1 K/uL   WBC Morphology MILD LEFT SHIFT (1-5%  METAS, OCC MYELO, OCC BANDS)     Comment: WHITE COUNT CONFIRMED ON SMEAR   Smear Review PLATELET COUNT CONFIRMED BY SMEAR     ABGS No results for input(s): PHART, PO2ART, TCO2, HCO3 in the last 72 hours.  Invalid input(s): PCO2 CULTURES Recent Results (from the past 240 hour(s))  Gram stain     Status: None   Collection Time: 02/29/16  2:44 PM  Result Value Ref Range Status   Specimen Description FLUID RIGHT PLEURAL COLLECTED BY DOCTOR  Final   Special Requests NONE  Final   Gram Stain   Final    MODERATE WBC PRESENT,BOTH PMN AND MONONUCLEAR NO ORGANISMS SEEN Performed at [facility]    Report Status 02/29/2016 FINAL  Final  Culture, body fluid-bottle     Status: None (Preliminary result)   Collection Time: 02/29/16  2:44 PM  Result Value Ref Range Status   Specimen Description FLUID RIGHT PLEURAL COLLECTED BY DOCTOR  Final   Special Requests BOTTLES DRAWN AEROBIC AND ANAEROBIC 10CC  Final   Culture NO GROWTH < 24 HOURS  Final   Report Status PENDING  Incomplete   Studies/Results: Ct Chest W Contrast  Result Date: 02/29/2016 CLINICAL DATA:  Pneumonia, intubated EXAM: CT CHEST WITH CONTRAST TECHNIQUE: Multidetector CT  imaging of the chest was performed during intravenous contrast administration. CONTRAST:  3m ISOVUE-300 IOPAMIDOL (ISOVUE-300) INJECTION 61% COMPARISON:  02/17/2016 FINDINGS: Cardiovascular: No significant vascular findings. Normal heart size. No pericardial effusion. Coronary artery atherosclerosis in the LAD and circumflex. Mediastinum/Nodes: No enlarged mediastinal, hilar, or axillary lymph nodes. Thyroid gland, trachea, and esophagus demonstrate no significant findings. Lungs/Pleura: Large right pleural effusion is severe compressive atelectasis with only a small area of lung at the apex. No left pleural effusion. No pneumothorax. Upper Abdomen: No acute upper abdominal abnormality. Musculoskeletal: No acute osseous abnormality. No lytic or sclerotic osseous lesion. IMPRESSION: 1. Large right pleural effusion with severe compressive atelectasis with only a small area of aerated lung at the apex. 2. Coronary artery atherosclerosis. Electronically Signed   By: HKathreen Devoid  On: 02/29/2016 13:12   Dg Chest Port 1 View  Result Date: 02/29/2016 CLINICAL DATA:  RIGHT pleural effusion post thoracentesis EXAM: PORTABLE CHEST 1 VIEW COMPARISON:  Portable exam 1521 hours compared to 02/27/2016 chest radiograph and CT chest of 02/29/2016 FINDINGS: Enlargement of cardiac silhouette with vascular congestion. Persistent moderate-sized RIGHT pleural effusion despite thoracentesis. No pneumothorax. Compressive atelectasis of RIGHT middle and RIGHT lower lobes. LEFT lung grossly clear. Osseous structures unremarkable. IMPRESSION: No pneumothorax following RIGHT thoracentesis. Persistent RIGHT pleural effusion with significant atelectasis of the lower RIGHT lung. Enlargement of cardiac silhouette with pulmonary vascular congestion. Electronically Signed   By: MLavonia DanaM.D.   On: 02/29/2016 15:38   UKoreaThoracentesis Asp Pleural Space W/img Guide  Result Date: 02/29/2016 INDICATION: RIGHT pleural effusion EXAM:  ULTRASOUND GUIDED RIGHT THORACENTESIS MEDICATIONS: None. COMPLICATIONS: None immediate. PROCEDURE: Procedure, benefits, and risks of procedure were discussed with patient. Written informed consent for procedure was obtained. Time out protocol followed. Pleural effusion localized by ultrasound at the posterior RIGHT hemithorax. Skin prepped and draped in usual sterile fashion. Skin and soft tissues anesthetized with 10 mL of 1% lidocaine. 8 French thoracentesis catheter placed into the RIGHT pleural space. 800 mL of dark old bloody RIGHT pleural aspirated by syringe pump. Procedure tolerated well by patient without immediate complication. FINDINGS: A total of approximately 800 ml of RIGHT pleural fluid was removed. Samples were sent to  the laboratory as requested by the clinical team. IMPRESSION: Successful ultrasound guided RIGHT thoracentesis yielding 800 mL of pleural fluid. Electronically Signed   By: Lavonia Dana M.D.   On: 02/29/2016 15:18    Medications:  Prior to Admission:  Prescriptions Prior to Admission  Medication Sig Dispense Refill Last Dose  . AMITIZA 24 MCG capsule Take 2 capsules by mouth daily.   02/17/2016 at Unknown time  . Aspirin-Salicylamide-Caffeine (BC HEADACHE POWDER PO) Take 1 Package by mouth daily as needed (pain).   02/17/2016 at Unknown time  . atorvastatin (LIPITOR) 10 MG tablet Take 10 mg by mouth daily.    02/17/2016 at Unknown time  . benzonatate (TESSALON) 200 MG capsule Take 200 mg by mouth 3 (three) times daily as needed for cough.   unknown  . furosemide (LASIX) 40 MG tablet Take 1 tablet (40 mg total) by mouth 2 (two) times daily. (Patient taking differently: Take 40 mg by mouth daily. ) 4 tablet 0 02/17/2016 at Unknown time  . HYDROcodone-acetaminophen (NORCO) 7.5-325 MG tablet Take 1 tablet by mouth every 6 (six) hours as needed for moderate pain (Must last 30 days.Do not drive or operate machinery while taking this medicine.). 180 tablet 0 unknown  . LORazepam  (ATIVAN) 2 MG tablet Take 2 mg by mouth at bedtime.   02/17/2016 at Unknown time  . losartan (COZAAR) 50 MG tablet Take 50 mg by mouth daily.   02/17/2016 at Unknown time  . metFORMIN (GLUCOPHAGE) 500 MG tablet Take 1 tablet by mouth 2 (two) times daily.   02/17/2016 at Unknown time  . mirtazapine (REMERON) 15 MG tablet Take 15 mg by mouth at bedtime.    02/17/2016 at Unknown time  . potassium chloride SA (K-DUR,KLOR-CON) 20 MEQ tablet Take 20 mEq by mouth daily.    02/17/2016 at Unknown time  . pregabalin (LYRICA) 150 MG capsule Take 150 mg by mouth 2 (two) times daily.   02/17/2016 at Unknown time  . traZODone (DESYREL) 50 MG tablet Take 2 tablets by mouth at bedtime.    02/17/2016 at Unknown time   Scheduled: . sodium chloride   Intravenous Once  . albuterol  2.5 mg Nebulization TID  . furosemide  40 mg Intravenous Q12H  . insulin aspart  0-20 Units Subcutaneous TID AC & HS  . insulin glargine  35 Units Subcutaneous Daily  . levofloxacin  500 mg Oral Daily  . LORazepam  1 mg Oral BID  . losartan  50 mg Oral Daily  . mirtazapine  15 mg Oral QHS  . pantoprazole (PROTONIX) IV  40 mg Intravenous Q12H  . potassium chloride  40 mEq Oral BID  . predniSONE  40 mg Oral Q breakfast  . pregabalin  150 mg Oral BID  . sodium chloride flush  3 mL Intravenous Q12H  . traZODone  100 mg Oral QHS   Continuous: . sodium chloride 75 mL/hr at 02/18/16 1425  . sodium chloride 50 mL/hr at 03/01/16 2233  . fentaNYL infusion INTRAVENOUS 50 mcg/hr (02/23/16 0830)   JSE:[id number], alum & mag hydroxide-simeth, HYDROcodone-acetaminophen, ondansetron **OR** ondansetron (ZOFRAN) IV, senna-docusate  Assesment: Admitted with cavitary pneumonia. He had acute hypoxic respiratory failure requiring intubation and mechanical ventilation. He had elevated troponin is felt to be related to demand ischemia. He had severe metabolic encephalopathy that has cleared. He's had acute upper GI bleeding and acute blood loss anemia  requiring blood transfusion and plans are eventually for him to have EGD. He has protein calorie  malnutrition and is on supplements. He was septic on admission had large volume fluid resuscitation and then became volume overloaded and this is being treated and improving. He is severely weak and deconditioned and plans are to continue physical therapy and then plan for him to go to rehabilitation. Principal Problem:   Cavitary pneumonia Active Problems:   Hemoptysis   Morbid obesity ([place])   Pneumatosis intestinalis   Pressure injury of skin   Type II diabetes mellitus, uncontrolled (HCC)   Sepsis (HCC)   Elevated troponin level   Acute respiratory failure with hypoxia (HCC)   Encephalopathy, metabolic   Acute upper GI bleeding   Acute blood loss anemia   Protein-calorie malnutrition (HCC)    Plan: Continue current treatments. Continue Lasix. Leave the Foley catheter in for now. Continue PT.    LOS: 13 days   Jasalyn Frysinger L 03/02/2016, 8:18 AM

## 2016-03-03 ENCOUNTER — Ambulatory Visit: Payer: Medicare Other | Admitting: Orthopaedic Surgery

## 2016-03-03 LAB — CBC WITH DIFFERENTIAL/PLATELET
BASOS ABS: 0 10*3/uL (ref 0.0–0.1)
BASOS PCT: 0 %
Eosinophils Absolute: 0.1 10*3/uL (ref 0.0–0.7)
Eosinophils Relative: 1 %
HEMATOCRIT: 28.2 % — AB (ref 39.0–52.0)
HEMOGLOBIN: 9.1 g/dL — AB (ref 13.0–17.0)
LYMPHS PCT: 13 %
Lymphs Abs: 1.9 10*3/uL (ref 0.7–4.0)
MCH: 30.1 pg (ref 26.0–34.0)
MCHC: 32.3 g/dL (ref 30.0–36.0)
MCV: 93.4 fL (ref 78.0–100.0)
MONOS PCT: 8 %
Monocytes Absolute: 1.2 10*3/uL — ABNORMAL HIGH (ref 0.1–1.0)
NEUTROS ABS: 11.3 10*3/uL — AB (ref 1.7–7.7)
NEUTROS PCT: 78 %
Platelets: 295 10*3/uL (ref 150–400)
RBC: 3.02 MIL/uL — ABNORMAL LOW (ref 4.22–5.81)
RDW: 16.3 % — ABNORMAL HIGH (ref 11.5–15.5)
WBC: 14.4 10*3/uL — ABNORMAL HIGH (ref 4.0–10.5)

## 2016-03-03 LAB — BASIC METABOLIC PANEL
ANION GAP: 5 (ref 5–15)
BUN: 11 mg/dL (ref 6–20)
CALCIUM: 7.6 mg/dL — AB (ref 8.9–10.3)
CO2: 35 mmol/L — AB (ref 22–32)
Chloride: 95 mmol/L — ABNORMAL LOW (ref 101–111)
Creatinine, Ser: 0.55 mg/dL — ABNORMAL LOW (ref 0.61–1.24)
GLUCOSE: 153 mg/dL — AB (ref 65–99)
POTASSIUM: 3.3 mmol/L — AB (ref 3.5–5.1)
Sodium: 135 mmol/L (ref 135–145)

## 2016-03-03 LAB — GLUCOSE, CAPILLARY
GLUCOSE-CAPILLARY: 133 mg/dL — AB (ref 65–99)
GLUCOSE-CAPILLARY: 250 mg/dL — AB (ref 65–99)
GLUCOSE-CAPILLARY: 172 mg/dL — AB (ref 65–99)
Glucose-Capillary: 131 mg/dL — ABNORMAL HIGH (ref 65–99)

## 2016-03-03 MED ORDER — PANTOPRAZOLE SODIUM 40 MG PO TBEC
40.0000 mg | DELAYED_RELEASE_TABLET | Freq: Two times a day (BID) | ORAL | Status: DC
  Administered 2016-03-03 – 2016-03-06 (×7): 40 mg via ORAL
  Filled 2016-03-03 (×8): qty 1

## 2016-03-03 MED ORDER — MORPHINE SULFATE (PF) 2 MG/ML IV SOLN
2.0000 mg | Freq: Three times a day (TID) | INTRAVENOUS | Status: DC | PRN
  Administered 2016-03-03: 2 mg via INTRAVENOUS
  Filled 2016-03-03: qty 1

## 2016-03-03 MED ORDER — PREDNISONE 20 MG PO TABS
20.0000 mg | ORAL_TABLET | Freq: Every day | ORAL | Status: DC
  Administered 2016-03-03 – 2016-03-05 (×2): 20 mg via ORAL
  Filled 2016-03-03 (×2): qty 1

## 2016-03-03 MED ORDER — FUROSEMIDE 10 MG/ML IJ SOLN: 40.0000 mg | Freq: Every day | INTRAMUSCULAR | Status: DC

## 2016-03-03 NOTE — Progress Notes (Signed)
Subjective: He denies shortness of breath or cough today. He was treated with BiPAP overnight. He denies any abdominal pain or bleeding difficulties.  Objective: Vital signs in last 24 hours: Vitals:   03/03/16 0200 03/03/16 0400 03/03/16 0600 03/03/16 0721  BP: 106/62 116/65    Pulse: 73 98  (!) 106  Resp: 20 (!) 25  (!) 22  Temp:   98.2 F (36.8 C) 97.5 F (36.4 C)  TempSrc:   Oral Oral  SpO2: 98% 100%  93%  Weight:   (!) 309 lb 11.9 oz (140.5 kg)   Height:       Weight change: -3 lb 1.4 oz (-1.4 kg)  Intake/Output Summary (Last 24 hours) at 03/03/16 0738 Last data filed at 03/03/16 0600  Gross per 24 hour  Intake             2880 ml  Output             6150 ml  Net            -3270 ml    Physical Exam: Alert. Breathing comfortably. Oxygen saturation in the low 90s. Heart rate in the high 90s and low 100s. Lungs reveal mild rhonchi.  Heart regular with no murmurs. Abdomen is soft and nontender. Extremities reveal no edema. PICC line remains in the left arm. Foley catheter in place.  Lab Results:    Results for orders placed or performed during the hospital encounter of 02/18/16 (from the past 24 hour(s))  Glucose, capillary     Status: Abnormal   Collection Time: 03/02/16  8:43 AM  Result Value Ref Range   Glucose-Capillary 131 (H) 65 - 99 mg/dL  Glucose, capillary     Status: Abnormal   Collection Time: 03/02/16 11:37 AM  Result Value Ref Range   Glucose-Capillary 180 (H) 65 - 99 mg/dL  Glucose, capillary     Status: Abnormal   Collection Time: 03/02/16  4:13 PM  Result Value Ref Range   Glucose-Capillary 343 (H) 65 - 99 mg/dL  Glucose, capillary     Status: Abnormal   Collection Time: 03/02/16  9:13 PM  Result Value Ref Range   Glucose-Capillary 254 (H) 65 - 99 mg/dL  Basic metabolic panel     Status: Abnormal   Collection Time: 03/03/16  3:55 AM  Result Value Ref Range   Sodium 135 135 - 145 mmol/L   Potassium 3.3 (L) 3.5 - 5.1 mmol/L   Chloride 95 (L) 101  - 111 mmol/L   CO2 35 (H) 22 - 32 mmol/L   Glucose, Bld 153 (H) 65 - 99 mg/dL   BUN 11 6 - 20 mg/dL   Creatinine, Ser 0.55 (L) 0.61 - 1.24 mg/dL   Calcium 7.6 (L) 8.9 - 10.3 mg/dL   GFR calc non Af Amer >60 >60 mL/min   GFR calc Af Amer >60 >60 mL/min   Anion gap 5 5 - 15  CBC with Differential/Platelet     Status: Abnormal   Collection Time: 03/03/16  3:55 AM  Result Value Ref Range   WBC 14.4 (H) 4.0 - 10.5 K/uL   RBC 3.02 (L) 4.22 - 5.81 MIL/uL   Hemoglobin 9.1 (L) 13.0 - 17.0 g/dL   HCT 28.2 (L) 39.0 - 52.0 %   MCV 93.4 78.0 - 100.0 fL   MCH 30.1 26.0 - 34.0 pg   MCHC 32.3 30.0 - 36.0 g/dL   RDW 16.3 (H) 11.5 - 15.5 %   Platelets 295  150 - 400 K/uL   Neutrophils Relative % 78 %   Neutro Abs 11.3 (H) 1.7 - 7.7 K/uL   Lymphocytes Relative 13 %   Lymphs Abs 1.9 0.7 - 4.0 K/uL   Monocytes Relative 8 %   Monocytes Absolute 1.2 (H) 0.1 - 1.0 K/uL   Eosinophils Relative 1 %   Eosinophils Absolute 0.1 0.0 - 0.7 K/uL   Basophils Relative 0 %   Basophils Absolute 0.0 0.0 - 0.1 K/uL  Glucose, capillary     Status: Abnormal   Collection Time: 03/03/16  7:20 AM  Result Value Ref Range   Glucose-Capillary 131 (H) 65 - 99 mg/dL     ABGS No results for input(s): PHART, PO2ART, TCO2, HCO3 in the last 72 hours.  Invalid input(s): PCO2 CULTURES Recent Results (from the past 240 hour(s))  Gram stain     Status: None   Collection Time: 02/29/16  2:44 PM  Result Value Ref Range Status   Specimen Description FLUID RIGHT PLEURAL COLLECTED BY DOCTOR  Final   Special Requests NONE  Final   Gram Stain   Final    MODERATE WBC PRESENT,BOTH PMN AND MONONUCLEAR NO ORGANISMS SEEN Performed at [facility]    Report Status 02/29/2016 FINAL  Final  Culture, body fluid-bottle     Status: None (Preliminary result)   Collection Time: 02/29/16  2:44 PM  Result Value Ref Range Status   Specimen Description FLUID RIGHT PLEURAL COLLECTED BY DOCTOR  Final   Special Requests BOTTLES DRAWN  AEROBIC AND ANAEROBIC 10CC  Final   Culture NO GROWTH 2 DAYS  Final   Report Status PENDING  Incomplete   Studies/Results: No results found. Micro Results: Recent Results (from the past 240 hour(s))  Gram stain     Status: None   Collection Time: 02/29/16  2:44 PM  Result Value Ref Range Status   Specimen Description FLUID RIGHT PLEURAL COLLECTED BY DOCTOR  Final   Special Requests NONE  Final   Gram Stain   Final    MODERATE WBC PRESENT,BOTH PMN AND MONONUCLEAR NO ORGANISMS SEEN Performed at [facility]    Report Status 02/29/2016 FINAL  Final  Culture, body fluid-bottle     Status: None (Preliminary result)   Collection Time: 02/29/16  2:44 PM  Result Value Ref Range Status   Specimen Description FLUID RIGHT PLEURAL COLLECTED BY DOCTOR  Final   Special Requests BOTTLES DRAWN AEROBIC AND ANAEROBIC 10CC  Final   Culture NO GROWTH 2 DAYS  Final   Report Status PENDING  Incomplete   Studies/Results: No results found. Medications:  I have reviewed the patient's current medications Scheduled Meds: . sodium chloride   Intravenous Once  . albuterol  2.5 mg Nebulization TID  . furosemide  40 mg Intravenous Q12H  . insulin aspart  0-20 Units Subcutaneous TID AC & HS  . insulin glargine  40 Units Subcutaneous Daily  . levofloxacin  500 mg Oral Daily  . LORazepam  1 mg Oral BID  . losartan  50 mg Oral Daily  . mirtazapine  15 mg Oral QHS  . pantoprazole (PROTONIX) IV  40 mg Intravenous Q12H  . potassium chloride  40 mEq Oral TID  . predniSONE  40 mg Oral Q breakfast  . pregabalin  150 mg Oral BID  . sodium chloride flush  3 mL Intravenous Q12H  . traZODone  100 mg Oral QHS   Continuous Infusions: . sodium chloride 75 mL/hr at 02/18/16 1425  .  sodium chloride 50 mL/hr at 03/03/16 0600   PRN Meds:.albuterol, alum & mag hydroxide-simeth, HYDROcodone-acetaminophen, ondansetron **OR** ondansetron (ZOFRAN) IV, senna-docusate   Assessment/Plan: #1. Pneumonia/sepsis.  Markedly improved over the past week. Antibiotics have been modified to oral Levaquin for 7 more days. Discussed with pulmonology. Symptoms improved after thoracentesis of 800 ML's. Culture pending still. #2. GI bleed. Continue pantoprazole. EGD tentatively planned for near future. Hemoglobin 9.1. #3. Diabetes. Fasting glucose 153. #4. Hypokalemia. Continue potassium supplementation. Continues to receive Lasix for volume overload. Chest x-ray reveals no edema but vascular congestion persist. Principal Problem:   Cavitary pneumonia Active Problems:   Hemoptysis   Morbid obesity ([place])   Pneumatosis intestinalis   Pressure injury of skin   Type II diabetes mellitus, uncontrolled (HCC)   Sepsis (HCC)   Elevated troponin level   Acute respiratory failure with hypoxia (HCC)   Encephalopathy, metabolic   Acute upper GI bleeding   Acute blood loss anemia   Protein-calorie malnutrition ([place])     LOS: 14 days   Richard Fields 03/03/2016, 7:38 AM

## 2016-03-03 NOTE — Progress Notes (Signed)
In to see patient at this time, and patient refusing to wear Bi-pap at this time constantly taking it off and throwing it on the ground. Patient provided educated on the risks and benefits associated with wearing is Bi-pap overnight for respiratory support, and the risk of not doing recommended medical therapy.  Patient insists that he is going home and will not wear it.  Patient placed back on HFNC at this time.  Patient hemodynamically stable with 02 sats  94-95. Will continue to monitor the patient closely.

## 2016-03-03 NOTE — Care Management Note (Signed)
Case Management Note  Patient Details  Name: Richard Fields MRN: [id number] Date of Birth: 12/18/1940   Expected Discharge Date:     03/04/2016             Expected Discharge Plan:  [place]  In-House Referral:  Clinical Social Work  Discharge planning Services  NA  Post Acute Care Choice:  NA Choice offered to:  NA   Status of Service:  In process, will continue to follow  If discussed at Long Length of Stay Meetings, dates discussed:  03/03/2016   Sherald Barge, RN 03/03/2016, 3:09 PM

## 2016-03-03 NOTE — Progress Notes (Signed)
Results for Richard Fields, Richard Fields (MRN [id number]) as of 03/03/2016 09:53  Ref. Range 03/02/2016 08:43 03/02/2016 11:37 03/02/2016 16:13 03/02/2016 21:13 03/03/2016 07:20  Glucose-Capillary Latest Ref Range: 65 - 99 mg/dL 131 (H) 180 (H) 343 (H) 254 (H) 131 (H)   Noted that postprandial blood sugars have been elevated and greater than 180 mg/dl.  Recommend adding Novolog 4 units TID as meal coverage if postprandial blood sugars continue to be elevated.  Will continue to monitor blood sugars. Harvel Ricks RN BSN CDE

## 2016-03-03 NOTE — Progress Notes (Signed)
Subjective:  Has been sitting up for one hour and wants to lay down. No specific complaints. Wants solid food.   Objective: Vital signs in last 24 hours: Temp:  [97.2 F (36.2 C)-98.2 F (36.8 C)] 97.5 F (36.4 C) (11/02 0721) Pulse Rate:  [73-108] 106 (11/02 0800) Resp:  [18-25] 18 (11/02 0800) BP: (100-125)/(51-80) 125/61 (11/02 0800) SpO2:  [92 %-100 %] 94 % (11/02 0831) Weight:  [309 lb 11.9 oz (140.5 kg)] 309 lb 11.9 oz (140.5 kg) (11/02 0600) Last BM Date: 03/03/16 General:   Alert,   pleasant and cooperative in NAD Head:  Normocephalic and atraumatic. Eyes:  Sclera clear, no icterus.  Abdomen:  Soft, nontender and nondistended.   Normal bowel sounds, without guarding, and without rebound.   Extremities:  Without clubbing, deformity. 1+ pitting edema. Neurologic:  Alert and  oriented x4;  grossly normal neurologically. Skin:  Intact without significant lesions or rashes. Psych:  Alert and cooperative. Normal mood and affect.  Intake/Output from previous day: 11/01 0701 - 11/02 0700 In: 2880 [P.O.:1780; I.V.:1100] Out: 6150 [Urine:6150] Intake/Output this shift: No intake/output data recorded.  Lab Results: CBC  Recent Labs  03/01/16 0357 03/02/16 0357 03/03/16 0355  WBC 13.8* 12.6* 14.4*  HGB 8.1* 8.0* 9.1*  HCT 25.2* 24.9* 28.2*  MCV 92.3 92.9 93.4  PLT 240 251 295   BMET  Recent Labs  03/01/16 0357 03/02/16 0357 03/03/16 0355  NA 136 138 135  K 4.0 3.2* 3.3*  CL 98* 97* 95*  CO2 35* 36* 35*  GLUCOSE 254* 165* 153*  BUN 15 11 11   CREATININE 0.51* 0.51* 0.55*  CALCIUM 7.2* 7.5* 7.6*   LFTs No results for input(s): BILITOT, BILIDIR, IBILI, ALKPHOS, AST, ALT, PROT, ALBUMIN in the last 72 hours. No results for input(s): LIPASE in the last 72 hours. PT/INR No results for input(s): LABPROT, INR in the last 72 hours.    Imaging Studies: Dg Chest 1 View  Result Date: 02/19/2016 CLINICAL DATA:  Shortness of breath for several weeks EXAM: CHEST 1  VIEW COMPARISON:  02/18/2016 FINDINGS: Cardiac shadow is enlarged. Right-sided pleural effusion is increasing from the prior exam. No focal infiltrate is seen in the left lung. No bony abnormality is noted. IMPRESSION: Increasing right-sided pleural effusion. Electronically Signed   By: Inez Catalina M.D.   On: 02/19/2016 09:28   Ct Chest W Contrast  Result Date: 02/29/2016 CLINICAL DATA:  Pneumonia, intubated EXAM: CT CHEST WITH CONTRAST TECHNIQUE: Multidetector CT imaging of the chest was performed during intravenous contrast administration. CONTRAST:  35mL ISOVUE-300 IOPAMIDOL (ISOVUE-300) INJECTION 61% COMPARISON:  02/17/2016 FINDINGS: Cardiovascular: No significant vascular findings. Normal heart size. No pericardial effusion. Coronary artery atherosclerosis in the LAD and circumflex. Mediastinum/Nodes: No enlarged mediastinal, hilar, or axillary lymph nodes. Thyroid gland, trachea, and esophagus demonstrate no significant findings. Lungs/Pleura: Large right pleural effusion is severe compressive atelectasis with only a small area of lung at the apex. No left pleural effusion. No pneumothorax. Upper Abdomen: No acute upper abdominal abnormality. Musculoskeletal: No acute osseous abnormality. No lytic or sclerotic osseous lesion. IMPRESSION: 1. Large right pleural effusion with severe compressive atelectasis with only a small area of aerated lung at the apex. 2. Coronary artery atherosclerosis. Electronically Signed   By: Kathreen Devoid   On: 02/29/2016 13:12   Ct Angio Chest Pe W/cm &/or Wo Cm  Result Date: 02/18/2016 CLINICAL DATA:  Hemoptysis for 2-3 days.  Back pain. EXAM: CT ANGIOGRAPHY CHEST WITH CONTRAST TECHNIQUE: Multidetector CT imaging  of the chest was performed using the standard protocol during bolus administration of intravenous contrast. Multiplanar CT image reconstructions and MIPs were obtained to evaluate the vascular anatomy. CONTRAST:  100 cc Isovue 370 intravenous COMPARISON:  None.  FINDINGS: Cardiovascular: CTA of the pulmonary arteries is limited by motion and suboptimal opacification. Apparent filling defect in a subsegmental left upper lobe branch, 6:121, is an area of motion and favored artifactual. The low-density is also not at a branch point. No filling defect is identified. No cardiomegaly or pericardial effusion. No acute aortic finding. Diffuse atherosclerotic calcification of the aorta and coronaries. Mediastinum/Nodes: Prominent right paratracheal lymph node of 11 mm short axis, incidental in isolation. Lungs/Pleura: There is atelectasis at the right base, multi segment, with a ~ 3 cm nonenhancing area in the lower lobe that has central lucency. There is small right pleural effusion that is small, with mild posterior chest loculation. No edema. Upper Abdomen: Hepatic steatosis Musculoskeletal: Degenerative changes without acute or aggressive finding. Review of the MIP images confirms the above findings. IMPRESSION: 1. Limited CTA with no definitive pulmonary embolism. There is an apparent filling defect within a subsegmental left upper lobe branch, but favored artifactual. Consider lower extremity Dopplers. 2. Right lower lobe atelectasis surrounding a nonenhancing cavitary focus, likely a cavitary pneumonia. Right pleural effusion is small but has areas of early loculation. 3. After completed treatment, recommend CT follow-up. Electronically Signed   By: Monte Fantasia M.D.   On: 02/18/2016 07:44   [place] Chest  Result Date: 02/19/2016 CLINICAL DATA:  Abnormal chest radiograph, RIGHT pleural effusion EXAM: CHEST ULTRASOUND COMPARISON:  Chest radiograph 02/19/2016 FINDINGS: Only a minimal amount of RIGHT pleural fluid is identified. Consolidated lower RIGHT lung is seen. Volume of fluid visualized is insufficient for thoracentesis. IMPRESSION: RIGHT lower lobe consolidation. Minimal RIGHT pleural effusion. Discussed with Dr. Luan Pulling. Electronically Signed   By: Lavonia Dana M.D.    On: 02/19/2016 14:07   Dg Chest Port 1 View  Result Date: 02/29/2016 CLINICAL DATA:  RIGHT pleural effusion post thoracentesis EXAM: PORTABLE CHEST 1 VIEW COMPARISON:  Portable exam 1521 hours compared to 02/27/2016 chest radiograph and CT chest of 02/29/2016 FINDINGS: Enlargement of cardiac silhouette with vascular congestion. Persistent moderate-sized RIGHT pleural effusion despite thoracentesis. No pneumothorax. Compressive atelectasis of RIGHT middle and RIGHT lower lobes. LEFT lung grossly clear. Osseous structures unremarkable. IMPRESSION: No pneumothorax following RIGHT thoracentesis. Persistent RIGHT pleural effusion with significant atelectasis of the lower RIGHT lung. Enlargement of cardiac silhouette with pulmonary vascular congestion. Electronically Signed   By: Lavonia Dana M.D.   On: 02/29/2016 15:38   Dg Chest Port 1 View  Result Date: 02/27/2016 CLINICAL DATA:  Pneumonia. EXAM: PORTABLE CHEST 1 VIEW COMPARISON:  02/26/2016. FINDINGS: Stable enlarged cardiac silhouette no significant change in a moderate to large-sized right pleural effusion. The left lung remains clear. The left PICC tip remains in the region of the origin of the superior vena cava. Unremarkable bones. IMPRESSION: 1. Stable moderate to large-sized right pleural effusion and cardiomegaly. 2. Left PICC tip at the origin of the superior vena cava. Electronically Signed   By: Claudie Revering M.D.   On: 02/27/2016 10:17   Dg Chest Port 1 View  Result Date: 02/26/2016 CLINICAL DATA:  Pt couldn't verbally explain reason for exam. Tech and nurse techs assisted to try and stable pt upright to get sufficient AP view of chest. Reason for exam is pneumonia. EXAM: PORTABLE CHEST 1 VIEW COMPARISON:  02/22/2016 FINDINGS: Opacity at the  right lung base has increased consistent with enlargement of the right pleural effusion, now moderate to large in size. No convincing left pleural effusion. No pneumothorax. No evidence of pulmonary edema.   No definite pneumonia. Cardiac silhouette is enlarged but stable. Left PICC has its tip at the left brachiocephalic vein superior vena cava confluence. IMPRESSION: 1. Increased right pleural effusion, now moderate to large. 2. Convincing pneumonia.  No pulmonary edema. Electronically Signed   By: Lajean Manes M.D.   On: 02/26/2016 10:04   Dg Chest Port 1 View  Result Date: 02/22/2016 CLINICAL DATA:  Respiratory failure. EXAM: PORTABLE CHEST 1 VIEW COMPARISON:  Radiographs of February 21, 2016. FINDINGS: Stable cardiomediastinal silhouette. Endotracheal and nasogastric tubes are in grossly good position. No pneumothorax is noted. Stable moderate right pleural effusion is noted with probable underlying atelectasis or infiltrate. Stable central pulmonary vascular congestion. Stable position of left subclavian catheter with distal tip in expected position of the SVC. Bony thorax is unremarkable. Stable minimal left basilar subsegmental atelectasis. IMPRESSION: Stable minimal left basilar subsegmental atelectasis. Stable moderate right pleural effusion with probable underlying atelectasis or infiltrate. Support apparatus in grossly good position. Electronically Signed   By: Marijo Conception, M.D.   On: 02/22/2016 10:09   Dg Chest Port 1 View  Result Date: 02/21/2016 CLINICAL DATA:  Respiratory failure EXAM: PORTABLE CHEST 1 VIEW COMPARISON:  02/20/2016 FINDINGS: Cardiomegaly again noted. Stable NG tube and endotracheal tube position. Central vascular congestion. Persistent right pleural effusion with atelectasis or infiltrate in right lower lobe. Small left pleural effusion with left basilar atelectasis. No convincing pulmonary edema. Left arm PICC line is unchanged in position. IMPRESSION: Stable NG tube and endotracheal tube position. Central vascular congestion. Persistent right pleural effusion with atelectasis or infiltrate in right lower lobe. Small left pleural effusion with left basilar atelectasis. No  convincing pulmonary edema. Electronically Signed   By: Lahoma Crocker M.D.   On: 02/21/2016 09:33   Dg Chest Port 1 View  Result Date: 02/20/2016 CLINICAL DATA:  Respiratory failure.  Evaluate endotracheal tube. EXAM: PORTABLE CHEST 1 VIEW COMPARISON:  02/19/2016. FINDINGS: Endotracheal tube is 5.6 cm above the carina and appropriately positioned. A nasogastric tube extends into the abdomen. Lower chest is not completely visualized. Patient rotated towards the right. Left lung appears clear. There continues to be right pleural fluid which is probably loculated. Persistent densities at the right chest base could represent atelectasis. Heart size is grossly stable. IMPRESSION: Endotracheal tube is appropriately positioned above the carina. Persistent pleural-parenchymal disease in the right lung with volume loss. Electronically Signed   By: Markus Daft M.D.   On: 02/20/2016 11:11   Dg Chest Port 1 View  Result Date: 02/18/2016 CLINICAL DATA:  75 year old male with shortness of breath and hemoptysis. EXAM: PORTABLE CHEST 1 VIEW COMPARISON:  Chest radiograph dated 08/20/2014 FINDINGS: There is shallow inspiration. Mild diffuse interstitial prominence and crowding as well as mild bronchiectatic changes. Right lung base hazy density likely atelectatic changes. Developing infiltrate is not excluded. Clinical correlation is recommended. There is no pleural effusion or pneumothorax. Stable mild cardiomegaly. No acute osseous pathology. IMPRESSION: Right lung base atelectatic changes versus less likely infiltrate. Clinical correlation is recommended. Electronically Signed   By: Anner Crete M.D.   On: 02/18/2016 06:23   Dg Chest Port 1v Same Day  Result Date: 02/20/2016 CLINICAL DATA:  Status post PICC line placement. EXAM: PORTABLE CHEST 1 VIEW COMPARISON:  02/20/2016 at 1050 hours FINDINGS: Endotracheal tube is 5  cm above the carina. Nasogastric tube extends to the abdomen but the tip is beyond the image.  Again noted is right pleural fluid extending up the lateral aspect of the right chest. There continues to be consolidation and volume loss in the right lower lung. Stable enlargement of the cardiac silhouette. A left arm PICC line has been placed. Catheter tip has a horizontal orientation and probably at the junction of the upper SVC and left innominate vein. IMPRESSION: PICC line tip near the junction of the left innominate vein and SVC. Persistent pleural and parenchymal disease in the right chest. Minimal change from the recent comparison examination. Electronically Signed   By: Markus Daft M.D.   On: 02/20/2016 12:55   Dg Shoulder Right Portable  Result Date: 02/18/2016 CLINICAL DATA:  Initial evaluation for acute right shoulder pain. No injury. EXAM: PORTABLE RIGHT SHOULDER COMPARISON:  None. FINDINGS: No acute fracture or dislocation. Humeral head in normal alignment with the glenoid. AC joint approximated. Degenerative osteoarthritic changes present about the [facility] joint. No periarticular calcification. Osseous mineralization normal. No soft tissue abnormality. IMPRESSION: 1. No acute osseous abnormality about the right shoulder. 2. 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 Electronically Signed   By: Jeannine Boga M.D.   On: 02/18/2016 06:24   Ct Renal Stone Study  Result Date: 02/18/2016 CLINICAL DATA:  Acute right flank pain. EXAM: CT ABDOMEN AND PELVIS WITHOUT CONTRAST TECHNIQUE: Multidetector CT imaging of the abdomen and pelvis was performed following the standard protocol without IV contrast. However, exam is somewhat limited due to respiratory motion artifact. COMPARISON:  None. FINDINGS: Lower chest: Right posterior basilar atelectasis or inflammation is noted with possible associated pleural effusion. Left lung base is clear. Hepatobiliary: No gallstones are noted. Possible mild fatty infiltration is noted of the liver. Pancreas: Normal. Spleen:  Normal. Adrenals/Urinary Tract: Adrenal glands and kidneys are unremarkable. No hydronephrosis or renal obstruction is noted. No renal or ureteral calculi are noted. Urinary bladder appears normal. Stomach/Bowel: There is no evidence of bowel obstruction. However, there does appear to be pneumatosis involving a portion of the transverse colon as well as a portion of the proximal descending colon. This potentially may represent benign pneumatosis, but ischemic bowel must be considered. Vascular/Lymphatic: Atherosclerosis of abdominal aorta is noted without aneurysm formation. No significant adenopathy is noted. Reproductive: Normal prostate gland. Other: No abnormal fluid collection is noted. 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 Possible pars defects is seen at L5. Well-defined lucencies are noted in the L3 and L4 vertebral bodies which may represent hemangiomas, but metastatic disease cannot be excluded. IMPRESSION: No hydronephrosis or renal obstruction is noted. No renal or ureteral calculi are noted. Right posterior basilar atelectasis or inflammation is noted with possible associated pleural effusion. Probable mild fatty infiltration of the liver. Aortic atherosclerosis. Well-defined lucencies are noted in the L3 and L4 vertebral bodies which may represent hemangiomas, but MRI with and without gadolinium is recommended of the lumbar spine to rule out malignancy or metastatic disease. Pneumatosis is noted in the transverse colon as well as proximal descending colon. While this may represent benign pneumatosis, ischemic bowel cannot be excluded and clinical correlation and surgical consultation is recommended. Critical Value/emergent results were called by telephone at the time of interpretation on 02/18/2016 at 7:24 am to Dr. Rolland Porter , who verbally acknowledged these results. Electronically Signed   By: Marijo Conception, M.D.   On: 02/18/2016 07:25  Ct  Angio Abd/pel W And/or Wo Contrast  Addendum Date: 02/18/2016   ADDENDUM REPORT: 02/18/2016 12:41 ADDENDUM: Small right pleural effusion with compressive atelectasis at the right lung base. There is a small focus of low-density and gas within this collapsed right lower lobe. This is seen on sequence 5, image 9. This area measures roughly 2.2 cm. This could represent a small focus of lung necrosis or cavitary pneumonia. This was seen on the recent chest CT from 02/18/2016. Electronically Signed   By: Markus Daft M.D.   On: 02/18/2016 12:41   Result Date: 02/18/2016 CLINICAL DATA:  Follow-up colonic pneumatosis. EXAM: CT ANGIOGRAPHY ABDOMEN AND PELVIS WITH CONTRAST AND WITHOUT CONTRAST TECHNIQUE: Multidetector CT imaging of the abdomen and pelvis was performed using the standard protocol during bolus administration of intravenous contrast. Multiplanar reconstructed images and MIPs were obtained and reviewed to evaluate the vascular anatomy. CONTRAST:  150mL ISOVUE-300 IOPAMIDOL (ISOVUE-300) INJECTION 61% COMPARISON:  CT 02/18/2016 FINDINGS: VASCULAR Aorta: Poor opacification of the arterial structures due to technical difficulties with this examination. Normal caliber of the abdominal aorta with atherosclerotic disease. No evidence for an aortic dissection. Celiac: Celiac trunk is patent without significant stent stenosis at the origin. The main branch vessels are patent. SMA: The SMA is patent with some atherosclerotic plaque at the origin. There does not appear to be a critical stenosis but limited evaluation. Renals: Bilateral renal arteries are patent with calcified plaque at the origin. There does not appear to be critical stenosis but limited evaluation. IMA: Proximal IMA appears to be patent. Inflow: Iliac arteries are calcified without significant aneurysm. Iliac arteries and proximal femoral arteries appear to be patent. Veins: IVC, renal veins and portal venous system are patent. Review of the MIP  images confirms the above findings. NON-VASCULAR Lower chest: Again noted is atelectasis and/or scarring at the left lung base. Small right pleural effusion with compressive atelectasis in the right lower lobe. Coronary artery calcifications. Hepatobiliary: Normal appearance of the liver, gallbladder and portal venous system. Pancreas: Normal appearance of the pancreas without inflammation or duct dilatation. Spleen: Normal appearance of spleen without enlargement. Adrenals/Urinary Tract: Normal adrenal glands. Probable cyst in the left kidney interpolar region without hydronephrosis. Normal appearance of the right kidney without hydronephrosis. Moderate distention of the urinary bladder without gross abnormality. Stomach/Bowel: Again noted is pneumatosis involving the transverse colon and a segment of the descending colon. There is no significant colonic wall thickening. No evidence for small bowel dilatation or obstruction. Cecum is located in the right upper abdomen and difficult to exclude some pneumatosis involving the cecum. Lymphatic: No significant lymphadenopathy in the abdomen or pelvis. Reproductive: Prostate is unremarkable. Other: No evidence for abdominal or pelvic ascites. There is no free intraperitoneal air. No evidence for portal gas. 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 There is a large lucency involving the L4 vertebral body which may be related to a Schmorl's node. There is also large low-density structure in the posterior aspect of the L3 vertebral body which could be related to a large Schmorl's node. IMPRESSION: VASCULAR Limited evaluation of the aorta and visceral arteries due to poor contrast opacification and technical issues with this examination. No gross abnormality to the aorta such as an aneurysm or dissection. Main visceral arteries appear to be patent without significant stenosis. NON-VASCULAR Stable colonic pneumatosis. The  pneumatosis is involving the transverse colon and a segment of the descending colon. No significant change from the previous examination and  etiology is unknown. No focal bowel wall thickening. No free intraperitoneal air. Again noted are lucent lesions within the L3 and L4 vertebral bodies. These could be related to very large Schmorl's nodes but indeterminate. Electronically Signed: By: Markus Daft M.D. On: 02/18/2016 12:22   US Thoracentesis Asp Pleural Space W/img Guide  Result Date: 02/29/2016 INDICATION: RIGHT pleural effusion EXAM: ULTRASOUND GUIDED RIGHT THORACENTESIS MEDICATIONS: None. COMPLICATIONS: None immediate. PROCEDURE: Procedure, benefits, and risks of procedure were discussed with patient. Written informed consent for procedure was obtained. Time out protocol followed. Pleural effusion localized by ultrasound at the posterior RIGHT hemithorax. Skin prepped and draped in usual sterile fashion. Skin and soft tissues anesthetized with 10 mL of 1% lidocaine. 8 French thoracentesis catheter placed into the RIGHT pleural space. 800 mL of dark old bloody RIGHT pleural aspirated by syringe pump. Procedure tolerated well by patient without immediate complication. FINDINGS: A total of approximately 800 ml of RIGHT pleural fluid was removed. Samples were sent to the laboratory as requested by the clinical team. IMPRESSION: Successful ultrasound guided RIGHT thoracentesis yielding 800 mL of pleural fluid. Electronically Signed   By: Lavonia Dana M.D.   On: 02/29/2016 15:18  [2 weeks]   Assessment: 75 year old male admitted with cavitary pneumonia, pleural effusion, requiring intubation (now extubated)and noted to have melena and acute drop in Hgb while inpatient with most likely differentials to include NSAID-induced gastropathy, PUD. Hgb improved after 2 units PRBCs. No further overt GI bleeding. Thoracentesis completed 10/30 with 800 ml removed but persistent moderate sized right pleural effusion  post thoracentesis. Patient feels breathing is better; he has been able to sit up in the chair for approximately an hour at a time and requires assistance of the lift to get from bed to chair. Per wife, patient was driving one month ago. Given stable Hgb and no further overt GI bleeding, would favor elective EGD prior to discharge.  Discussed with Dr. Willey Blade this morning who did not feel patient ready for conscious sedation. Discussed providing regular diet and possible EGD Friday.    Plan: 1. PPI PO BID. 2. Possible EGD tomorrow. Will reassess in AM and decide. NPO after midnight.   Laureen Ochs. Bernarda Caffey [facility] [phone] 11/2/201712:46 PM     LOS: 14 days

## 2016-03-03 NOTE — Progress Notes (Signed)
Subjective: He says he feels okay. No new complaints. Events of last night related to BiPAP noted and he is on nasal cannula now. No complaints of chest pain. His breathing he says is okay. He is still very weak. No abdominal pain. No nausea or vomiting.  Objective: Vital signs in last 24 hours: Temp:  [97.2 F (36.2 C)-98.2 F (36.8 C)] 98.2 F (36.8 C) (11/02 0600) Pulse Rate:  [67-108] 98 (11/02 0400) Resp:  [20-26] 25 (11/02 0400) BP: (100-125)/(50-80) 116/65 (11/02 0400) SpO2:  [89 %-100 %] 100 % (11/02 0400) Weight:  [140.5 kg (309 lb 11.9 oz)] 140.5 kg (309 lb 11.9 oz) (11/02 0600) Weight change: -1.4 kg (-3 lb 1.4 oz) Last BM Date: 02/27/16  Intake/Output from previous day: 11/01 0701 - 11/02 0700 In: 2880 [P.O.:1780; I.V.:1100] Out: 6150 [Urine:6150]  PHYSICAL EXAM General appearance: alert, cooperative, mild distress and morbidly obese Resp: Rhonchi on the right less on the left Cardio: regular rate and rhythm, S1, S2 normal, no murmur, click, rub or gallop GI: soft, non-tender; bowel sounds normal; no masses,  no organomegaly Extremities: He still has generalized puffiness of his skin but this is markedly improved. He has venous stasis changes. Skin warm and dry. Pupils react. Mucous membranes are moist  Lab Results:  Results for orders placed or performed during the hospital encounter of 02/18/16 (from the past 48 hour(s))  Glucose, capillary     Status: Abnormal   Collection Time: 03/01/16  7:28 AM  Result Value Ref Range   Glucose-Capillary 294 (H) 65 - 99 mg/dL   Comment 1 Notify RN    Comment 2 Document in Chart   Vancomycin, trough     Status: None   Collection Time: 03/01/16  8:12 AM  Result Value Ref Range   Vancomycin Tr 18 15 - 20 ug/mL  Glucose, capillary     Status: Abnormal   Collection Time: 03/01/16 11:39 AM  Result Value Ref Range   Glucose-Capillary 245 (H) 65 - 99 mg/dL   Comment 1 Notify RN    Comment 2 Document in Chart   Glucose,  capillary     Status: Abnormal   Collection Time: 03/01/16  4:10 PM  Result Value Ref Range   Glucose-Capillary 265 (H) 65 - 99 mg/dL   Comment 1 Notify RN    Comment 2 Document in Chart   Glucose, capillary     Status: Abnormal   Collection Time: 03/01/16  9:39 PM  Result Value Ref Range   Glucose-Capillary 283 (H) 65 - 99 mg/dL  Basic metabolic panel     Status: Abnormal   Collection Time: 03/02/16  3:57 AM  Result Value Ref Range   Sodium 138 135 - 145 mmol/L   Potassium 3.2 (L) 3.5 - 5.1 mmol/L    Comment: DELTA CHECK NOTED   Chloride 97 (L) 101 - 111 mmol/L   CO2 36 (H) 22 - 32 mmol/L   Glucose, Bld 165 (H) 65 - 99 mg/dL   BUN 11 6 - 20 mg/dL   Creatinine, Ser 0.51 (L) 0.61 - 1.24 mg/dL   Calcium 7.5 (L) 8.9 - 10.3 mg/dL   GFR calc non Af Amer >60 >60 mL/min   GFR calc Af Amer >60 >60 mL/min    Comment: (NOTE) The eGFR has been calculated using the CKD EPI equation. This calculation has not been validated in all clinical situations. eGFR's persistently <60 mL/min signify possible Chronic Kidney Disease.    Anion gap 5 5 -  15  CBC with Differential/Platelet     Status: Abnormal   Collection Time: 03/02/16  3:57 AM  Result Value Ref Range   WBC 12.6 (H) 4.0 - 10.5 K/uL    Comment: WHITE COUNT CONFIRMED ON SMEAR   RBC 2.68 (L) 4.22 - 5.81 MIL/uL   Hemoglobin 8.0 (L) 13.0 - 17.0 g/dL   HCT 24.9 (L) 39.0 - 52.0 %   MCV 92.9 78.0 - 100.0 fL   MCH 29.9 26.0 - 34.0 pg   MCHC 32.1 30.0 - 36.0 g/dL   RDW 15.9 (H) 11.5 - 15.5 %   Platelets 251 150 - 400 K/uL    Comment: SPECIMEN CHECKED FOR CLOTS PLATELET COUNT CONFIRMED BY SMEAR    Neutrophils Relative % 78 %   Lymphocytes Relative 14 %   Monocytes Relative 8 %   Eosinophils Relative 0 %   Basophils Relative 0 %   Neutro Abs 9.8 (H) 1.7 - 7.7 K/uL   Lymphs Abs 1.8 0.7 - 4.0 K/uL   Monocytes Absolute 1.0 0.1 - 1.0 K/uL   Eosinophils Absolute 0.0 0.0 - 0.7 K/uL   Basophils Absolute 0.0 0.0 - 0.1 K/uL   WBC Morphology  MILD LEFT SHIFT (1-5% METAS, OCC MYELO, OCC BANDS)     Comment: WHITE COUNT CONFIRMED ON SMEAR   Smear Review PLATELET COUNT CONFIRMED BY SMEAR   Glucose, capillary     Status: Abnormal   Collection Time: 03/02/16  8:43 AM  Result Value Ref Range   Glucose-Capillary 131 (H) 65 - 99 mg/dL  Glucose, capillary     Status: Abnormal   Collection Time: 03/02/16 11:37 AM  Result Value Ref Range   Glucose-Capillary 180 (H) 65 - 99 mg/dL  Glucose, capillary     Status: Abnormal   Collection Time: 03/02/16  4:13 PM  Result Value Ref Range   Glucose-Capillary 343 (H) 65 - 99 mg/dL  Glucose, capillary     Status: Abnormal   Collection Time: 03/02/16  9:13 PM  Result Value Ref Range   Glucose-Capillary 254 (H) 65 - 99 mg/dL  Basic metabolic panel     Status: Abnormal   Collection Time: 03/03/16  3:55 AM  Result Value Ref Range   Sodium 135 135 - 145 mmol/L   Potassium 3.3 (L) 3.5 - 5.1 mmol/L   Chloride 95 (L) 101 - 111 mmol/L   CO2 35 (H) 22 - 32 mmol/L   Glucose, Bld 153 (H) 65 - 99 mg/dL   BUN 11 6 - 20 mg/dL   Creatinine, Ser 0.55 (L) 0.61 - 1.24 mg/dL   Calcium 7.6 (L) 8.9 - 10.3 mg/dL   GFR calc non Af Amer >60 >60 mL/min   GFR calc Af Amer >60 >60 mL/min    Comment: (NOTE) The eGFR has been calculated using the CKD EPI equation. This calculation has not been validated in all clinical situations. eGFR's persistently <60 mL/min signify possible Chronic Kidney Disease.    Anion gap 5 5 - 15  CBC with Differential/Platelet     Status: Abnormal   Collection Time: 03/03/16  3:55 AM  Result Value Ref Range   WBC 14.4 (H) 4.0 - 10.5 K/uL   RBC 3.02 (L) 4.22 - 5.81 MIL/uL   Hemoglobin 9.1 (L) 13.0 - 17.0 g/dL   HCT 28.2 (L) 39.0 - 52.0 %   MCV 93.4 78.0 - 100.0 fL   MCH 30.1 26.0 - 34.0 pg   MCHC 32.3 30.0 - 36.0 g/dL  RDW 16.3 (H) 11.5 - 15.5 %   Platelets 295 150 - 400 K/uL   Neutrophils Relative % 78 %   Neutro Abs 11.3 (H) 1.7 - 7.7 K/uL   Lymphocytes Relative 13 %    Lymphs Abs 1.9 0.7 - 4.0 K/uL   Monocytes Relative 8 %   Monocytes Absolute 1.2 (H) 0.1 - 1.0 K/uL   Eosinophils Relative 1 %   Eosinophils Absolute 0.1 0.0 - 0.7 K/uL   Basophils Relative 0 %   Basophils Absolute 0.0 0.0 - 0.1 K/uL    ABGS No results for input(s): PHART, PO2ART, TCO2, HCO3 in the last 72 hours.  Invalid input(s): PCO2 CULTURES Recent Results (from the past 240 hour(s))  Gram stain     Status: None   Collection Time: 02/29/16  2:44 PM  Result Value Ref Range Status   Specimen Description FLUID RIGHT PLEURAL COLLECTED BY DOCTOR  Final   Special Requests NONE  Final   Gram Stain   Final    MODERATE WBC PRESENT,BOTH PMN AND MONONUCLEAR NO ORGANISMS SEEN Performed at [facility]    Report Status 02/29/2016 FINAL  Final  Culture, body fluid-bottle     Status: None (Preliminary result)   Collection Time: 02/29/16  2:44 PM  Result Value Ref Range Status   Specimen Description FLUID RIGHT PLEURAL COLLECTED BY DOCTOR  Final   Special Requests BOTTLES DRAWN AEROBIC AND ANAEROBIC 10CC  Final   Culture NO GROWTH 2 DAYS  Final   Report Status PENDING  Incomplete   Studies/Results: No results found.  Medications:  Prior to Admission:  Prescriptions Prior to Admission  Medication Sig Dispense Refill Last Dose  . AMITIZA 24 MCG capsule Take 2 capsules by mouth daily.   02/17/2016 at Unknown time  . Aspirin-Salicylamide-Caffeine (BC HEADACHE POWDER PO) Take 1 Package by mouth daily as needed (pain).   02/17/2016 at Unknown time  . atorvastatin (LIPITOR) 10 MG tablet Take 10 mg by mouth daily.    02/17/2016 at Unknown time  . benzonatate (TESSALON) 200 MG capsule Take 200 mg by mouth 3 (three) times daily as needed for cough.   unknown  . furosemide (LASIX) 40 MG tablet Take 1 tablet (40 mg total) by mouth 2 (two) times daily. (Patient taking differently: Take 40 mg by mouth daily. ) 4 tablet 0 02/17/2016 at Unknown time  . HYDROcodone-acetaminophen (NORCO) 7.5-325  MG tablet Take 1 tablet by mouth every 6 (six) hours as needed for moderate pain (Must last 30 days.Do not drive or operate machinery while taking this medicine.). 180 tablet 0 unknown  . LORazepam (ATIVAN) 2 MG tablet Take 2 mg by mouth at bedtime.   02/17/2016 at Unknown time  . losartan (COZAAR) 50 MG tablet Take 50 mg by mouth daily.   02/17/2016 at Unknown time  . metFORMIN (GLUCOPHAGE) 500 MG tablet Take 1 tablet by mouth 2 (two) times daily.   02/17/2016 at Unknown time  . mirtazapine (REMERON) 15 MG tablet Take 15 mg by mouth at bedtime.    02/17/2016 at Unknown time  . potassium chloride SA (K-DUR,KLOR-CON) 20 MEQ tablet Take 20 mEq by mouth daily.    02/17/2016 at Unknown time  . pregabalin (LYRICA) 150 MG capsule Take 150 mg by mouth 2 (two) times daily.   02/17/2016 at Unknown time  . traZODone (DESYREL) 50 MG tablet Take 2 tablets by mouth at bedtime.    02/17/2016 at Unknown time   Scheduled: . sodium chloride  Intravenous Once  . albuterol  2.5 mg Nebulization TID  . furosemide  40 mg Intravenous Q12H  . insulin aspart  0-20 Units Subcutaneous TID AC & HS  . insulin glargine  40 Units Subcutaneous Daily  . levofloxacin  500 mg Oral Daily  . LORazepam  1 mg Oral BID  . losartan  50 mg Oral Daily  . mirtazapine  15 mg Oral QHS  . pantoprazole (PROTONIX) IV  40 mg Intravenous Q12H  . potassium chloride  40 mEq Oral TID  . predniSONE  40 mg Oral Q breakfast  . pregabalin  150 mg Oral BID  . sodium chloride flush  3 mL Intravenous Q12H  . traZODone  100 mg Oral QHS   Continuous: . sodium chloride 75 mL/hr at 02/18/16 1425  . sodium chloride 50 mL/hr at 03/03/16 0600  . fentaNYL infusion INTRAVENOUS 50 mcg/hr (02/23/16 0830)   YIF:OYDXAJOIN, alum & mag hydroxide-simeth, HYDROcodone-acetaminophen, ondansetron **OR** ondansetron (ZOFRAN) IV, senna-docusate  Assesment: He was admitted with a cavitary pneumonia. He is now on oral antibiotics. He is improving. He was septic on  admission and had large volume fluid resuscitation and his sepsis has resolved.  He had GI bleeding requiring blood transfusion and had acute blood loss anemia. His hemoglobin level has stabilized. GI has seen him and plans EGD. He is on clear liquids and may be able to advance his diet. No further bleeding.  He has morbid obesity unchanged  He had acute hypoxic respiratory failure on admission still has hypoxia still requiring oxygen. He required mechanical ventilation but has now been off for approximately a week. He is now refusing to wear BiPAP at night.  He had elevated troponin level thought to be related to demand ischemia with no evidence of acute coronary syndrome  He has diabetes and has had adjustments made in his insulin and his blood sugar is better.  He has extreme weakness and plans are for him to go to rehabilitation when discharged  He had severe metabolic encephalopathy on admission and that has resolved  He has protein calorie malnutrition and since he is not GI bleeding he may need diet advanced.  He has a large right pleural effusion which was blood. No evidence of cancer by cytology and cultures are thus far negative.  Principal Problem:   Cavitary pneumonia Active Problems:   Hemoptysis   Morbid obesity ([place])   Pneumatosis intestinalis   Pressure injury of skin   Type II diabetes mellitus, uncontrolled (HCC)   Sepsis (HCC)   Elevated troponin level   Acute respiratory failure with hypoxia (HCC)   Encephalopathy, metabolic   Acute upper GI bleeding   Acute blood loss anemia   Protein-calorie malnutrition (HCC)    Plan: Dr. Willey Blade his primary care physician has returned today. Since he is not having significant respiratory issues now I will plan to sign off but I'm happy to see him if needed. From a strictly pulmonary point of view he would be okay to transfer out of stepdown but he does have a number of medical issues which makes him medically  complicated.  Thanks for allowing me to see him with you. Typically with a pneumonia that has a cavity/lung abscess antibiotics are continued for a more prolonged period. I would continue at least 7 more days.    LOS: 14 days   Acasia Skilton L 03/03/2016, 7:21 AM

## 2016-03-03 NOTE — Progress Notes (Signed)
Physical Therapy Treatment Patient Details Name: Richard Fields MRN: [id number] DOB: Sep 16, 1940 Today's Date: 03/03/2016    History of Present Illness 75 y.o. male with a past medical history significant for DM, HLD, HTN, and peripheral neuropathy, who presents by ambulance with complaints of blackish/brownish hemoptysis that onset 1 month ago. Very poor historian. H/o mainly obtained by caregiver and wife at bedside. Per wife he has associated right shoulder pain and profuse diaphoresis overnight. He denies any fever or chills. While in the ED, he was started on vancomycin and admitted for further evaluation of cavitary pneumonia.  CT Chest: RLL ATX surrounding a non-enhancing cavitary lesion, likely a cavitary PNA. CTA Abdomen: pneumatosis of the transverse colon and a segment of the descending colon    PT Comments    Pt received in bed, and was agreeable to PT tx.  Pt continues to be more awake and alert today, however still decreased orientation, but carrying on conversation with the nursing staff and therapist throughout tx.  Pt continues to require Max A +2 for rolling each direction.  Maxi sky used to transfer pt bed<>chair today.  Continue to recommend SNF due to continued generalized weakness.  Will attempt having pt sit on the EOB and possible stand with assistance at next tx session.     Follow Up Recommendations  SNF     Equipment Recommendations  None recommended by PT    Recommendations for Other Services       Precautions / Restrictions Precautions Precautions: Fall Precaution Comments: Due to current immobility Restrictions Weight Bearing Restrictions: No    Mobility  Bed Mobility Overal bed mobility: +2 for physical assistance Bed Mobility: Rolling (Pt demonstrates improved participation with mobiltiy, and is assisting with holding himself on his side during rolling.  Nurse tech and RN present for peri-hygiene and bathing prior to transfer.  )               Transfers Overall transfer level: Needs assistance (+2 person assistance for safety)                  Ambulation/Gait                 Stairs            Wheelchair Mobility    Modified Rankin (Stroke Patients Only)       Balance                                    Cognition Arousal/Alertness: Awake/alert Behavior During Therapy: WFL for tasks assessed/performed Overall Cognitive Status:  (Disoriented to time, and situation)                      Exercises      General Comments        Pertinent Vitals/Pain      Home Living                      Prior Function            PT Goals (current goals can now be found in the care plan section) Acute Rehab PT Goals Patient Stated Goal: Pt stated he wants to be a participant PT Goal Formulation: With patient Time For Goal Achievement: 03/10/16 Potential to Achieve Goals: Fair Progress towards PT goals: Progressing toward goals    Frequency    Min 5X/week  PT Plan Current plan remains appropriate    Co-evaluation PT/OT/SLP Co-Evaluation/Treatment: Yes Reason for Co-Treatment: Complexity of the patient's impairments (multi-system involvement);For patient/therapist safety PT goals addressed during session: Mobility/safety with mobility;Strengthening/ROM       End of Session Equipment Utilized During Treatment: Oxygen Activity Tolerance: Patient limited by fatigue Patient left: in chair;with call bell/phone within reach     Time: [id number] PT Time Calculation (min) (ACUTE ONLY): 38 min  Charges:  $Therapeutic Activity: 8-22 mins                    G Codes:      Beth Letisia Schwalb, PT, DPT X: [phone]

## 2016-03-03 NOTE — Progress Notes (Signed)
Occupational Therapy Treatment Patient Details Name: Richard Fields MRN: [id number] DOB: [birth year] Today's Date: 03/03/2016    History of present illness 75 y.o. male with a past medical history significant for DM, HLD, HTN, and peripheral neuropathy, who presents by ambulance with complaints of blackish/brownish hemoptysis that onset 1 month ago. Very poor historian. H/o mainly obtained by caregiver and wife at bedside. Per wife he has associated right shoulder pain and profuse diaphoresis overnight. He denies any fever or chills. While in the ED, he was started on vancomycin and admitted for further evaluation of cavitary pneumonia.  CT Chest: RLL ATX surrounding a non-enhancing cavitary lesion, likely a cavitary PNA. CTA Abdomen: pneumatosis of the transverse colon and a segment of the descending colon   OT comments  Pt awake and alert this morning, agreeable to OT/PT treatment. Pt with improved alertness compared to evaluation, able to answer questions, follow commands, and assist with bed mobility by grabbing rails and assisting with holding himself on his side. Continues to required Max A +2 for bed mobility, Maxisky used for bed to chair transfer. Continue to recommend SNF on discharge for weakness and decreased safety and independence in ADL completion.    Follow Up Recommendations  SNF    Equipment Recommendations  None recommended by OT       Precautions / Restrictions Precautions Precautions: Fall Precaution Comments: Due to current immobility Restrictions Weight Bearing Restrictions: No       Mobility Bed Mobility Overal bed mobility: +2 for physical assistance Bed Mobility: Rolling (Pt able to assist with holding himself on his side )              Transfers Overall transfer level: Needs assistance (+2 physcial assistance)                        ADL Overall ADL's : Needs assistance/impaired         Upper Body Bathing: Maximal assistance;Bed  level Upper Body Bathing Details (indicate cue type and reason): Nurse tech performed bathing for pt, pt able to wash face with cuing Lower Body Bathing: Total assistance;Bed level   Upper Body Dressing : Total assistance;Bed level           Toileting- Clothing Manipulation and Hygiene: Total assistance;+2 for physical assistance;Bed level Toileting - Clothing Manipulation Details (indicate cue type and reason): Pt able to assist with rolling side to side for peri hygiene tasks. Pt required +2 assist, able to reach for bedrails with BUE and assist with rolling. Nsg staff performed all peri-hygiene tasks                        Cognition   Behavior During Therapy: [facility] for tasks assessed/performed Overall Cognitive Status: Impaired/Different from baseline (disoriented to time and situation)                           Frequency  Min 2X/week        Progress Toward Goals  OT Goals(current goals can now be found in the care plan section)  Progress towards OT goals: Progressing toward goals  Acute Rehab OT Goals Patient Stated Goal: Pt stated he wants to be a participant OT Goal Formulation: Patient unable to participate in goal setting Time For Goal Achievement: 03/11/16 Potential to Achieve Goals: Good ADL Goals Pt Will Perform Grooming: with mod assist;sitting Pt Will Transfer to Toilet: with mod  assist;bedside commode;stand pivot transfer Pt/caregiver will Perform Home Exercise Program: Increased strength;Both right and left upper extremity;With Supervision;With written HEP provided  Plan Discharge plan remains appropriate    Co-evaluation    PT/OT/SLP Co-Evaluation/Treatment: Yes Reason for Co-Treatment: Complexity of the patient's impairments (multi-system involvement);For patient/therapist safety PT goals addressed during session: Mobility/safety with mobility;Strengthening/ROM OT goals addressed during session: ADL's and  self-care;Strengthening/ROM      End of Session     Activity Tolerance Patient tolerated treatment well   Patient Left in chair;with call bell/phone within reach           Time: [id number] OT Time Calculation (min): 38 min  Charges: OT General Charges $OT Visit: 1 Procedure OT Treatments $Self Care/Home Management : 38-52 mins    Guadelupe Sabin, OTR/L  [phone] 03/03/2016, 12:10 PM

## 2016-03-04 DIAGNOSIS — Z9889 Other specified postprocedural states: Secondary | ICD-10-CM

## 2016-03-04 DIAGNOSIS — R0902 Hypoxemia: Secondary | ICD-10-CM

## 2016-03-04 DIAGNOSIS — J90 Pleural effusion, not elsewhere classified: Secondary | ICD-10-CM

## 2016-03-04 DIAGNOSIS — Z7982 Long term (current) use of aspirin: Secondary | ICD-10-CM

## 2016-03-04 DIAGNOSIS — G9341 Metabolic encephalopathy: Secondary | ICD-10-CM

## 2016-03-04 DIAGNOSIS — R042 Hemoptysis: Secondary | ICD-10-CM

## 2016-03-04 DIAGNOSIS — R0602 Shortness of breath: Secondary | ICD-10-CM

## 2016-03-04 DIAGNOSIS — J9601 Acute respiratory failure with hypoxia: Secondary | ICD-10-CM

## 2016-03-04 DIAGNOSIS — R109 Unspecified abdominal pain: Secondary | ICD-10-CM

## 2016-03-04 LAB — BASIC METABOLIC PANEL
Anion gap: 4 — ABNORMAL LOW (ref 5–15)
BUN: 10 mg/dL (ref 6–20)
CALCIUM: 7.6 mg/dL — AB (ref 8.9–10.3)
CO2: 31 mmol/L (ref 22–32)
CREATININE: 0.5 mg/dL — AB (ref 0.61–1.24)
Chloride: 101 mmol/L (ref 101–111)
GFR calc Af Amer: >60 mL/min (ref >60)
GLUCOSE: 120 mg/dL — AB (ref 65–99)
POTASSIUM: 4 mmol/L (ref 3.5–5.1)
SODIUM: 136 mmol/L (ref 135–145)

## 2016-03-04 LAB — CBC
HCT: 28 % — ABNORMAL LOW (ref 39.0–52.0)
Hemoglobin: 8.9 g/dL — ABNORMAL LOW (ref 13.0–17.0)
MCH: 30.1 pg (ref 26.0–34.0)
MCHC: 31.8 g/dL (ref 30.0–36.0)
MCV: 94.6 fL (ref 78.0–100.0)
PLATELETS: 321 10*3/uL (ref 150–400)
RBC: 2.96 MIL/uL — ABNORMAL LOW (ref 4.22–5.81)
RDW: 16.6 % — AB (ref 11.5–15.5)
WBC: 12.4 10*3/uL — AB (ref 4.0–10.5)

## 2016-03-04 LAB — GLUCOSE, CAPILLARY
GLUCOSE-CAPILLARY: 126 mg/dL — AB (ref 65–99)
GLUCOSE-CAPILLARY: 107 mg/dL — AB (ref 65–99)
GLUCOSE-CAPILLARY: 155 mg/dL — AB (ref 65–99)
Glucose-Capillary: 156 mg/dL — ABNORMAL HIGH (ref 65–99)

## 2016-03-04 MED ORDER — MORPHINE SULFATE (PF) 2 MG/ML IV SOLN
2.0000 mg | INTRAVENOUS | Status: DC | PRN
  Administered 2016-03-04: 2 mg via INTRAVENOUS
  Filled 2016-03-04: qty 1

## 2016-03-04 NOTE — Progress Notes (Addendum)
REVIEWED. Admitted with hemoptysis and MULTIPLE CO-MORBIDITIES. LAST pRBCs 4-5 DAYS AGO. Hb STABLE. NO BRBPR OR MELENA. NO INDICATION FOR ENDOSCOPY AT THIS TIME. Risk of respiratory complication outweighs benefits of an EGD. CONSIDER EGD PRIOR TO DISCHARGE.    Subjective: Patient states he's doing ok this morning. Breathing better. Seems to have some short term memory loss. Denies abdominal pain, N/V, obvious blood in stool. Last bowel movement yesterday, no heme noted by staff but not heme checked. Tolerated diet well. No other GI complaints.  Objective: Vital signs in last 24 hours: Temp:  [97.5 F (36.4 C)-98.7 F (37.1 C)] 97.5 F (36.4 C) (11/03 0400) Pulse Rate:  [104-112] 108 (11/02 2038) Resp:  [17-27] 22 (11/02 2038) BP: (97-126)/(62-73) 126/72 (11/02 1800) SpO2:  [72 %-99 %] 99 % (11/02 2038) Weight:  [304 lb 0.2 oz (137.9 kg)] 304 lb 0.2 oz (137.9 kg) (11/03 0314) Last BM Date: 03/03/16 General:   Alert and oriented, pleasant. Morbidly obese male. Head:  Normocephalic and atraumatic. Eyes:  No icterus, sclera clear. Conjuctiva pink.  Heart:  S1, S2 present, no murmurs noted.  Lungs: Clear to auscultation bilaterally, without wheezing, rales, or rhonchi.  Abdomen:  Bowel sounds present, obese but soft, non-tender, non-distended. No HSM or hernias noted. No rebound or guarding. Msk:  Symmetrical without gross deformities. Neurologic:  Alert and  oriented x4;  grossly normal neurologically. Seems to have short term memory loss. Skin:  Warm and dry, intact without significant lesions.  Psych:  Alert and cooperative. Normal mood and affect.  Intake/Output from previous day: 11/02 0701 - 11/03 0700 In: 680 [P.O.:680] Out: 1600 [Urine:1600] Intake/Output this shift: Total I/O In: -  Out: 550 [Urine:550]  Lab Results:  Recent Labs  03/02/16 0357 03/03/16 0355 03/04/16 0408  WBC 12.6* 14.4* 12.4*  HGB 8.0* 9.1* 8.9*  HCT 24.9* 28.2* 28.0*  PLT 251 295 321    BMET  Recent Labs  03/02/16 0357 03/03/16 0355 03/04/16 0408  NA 138 135 136  K 3.2* 3.3* 4.0  CL 97* 95* 101  CO2 36* 35* 31  GLUCOSE 165* 153* 120*  BUN 11 11 10   CREATININE 0.51* 0.55* 0.50*  CALCIUM 7.5* 7.6* 7.6*   LFT No results for input(s): PROT, ALBUMIN, AST, ALT, ALKPHOS, BILITOT, BILIDIR, IBILI in the last 72 hours. PT/INR No results for input(s): LABPROT, INR in the last 72 hours. Hepatitis Panel No results for input(s): HEPBSAG, HCVAB, HEPAIGM, HEPBIGM in the last 72 hours.   Studies/Results: No results found.  Assessment: 75 year old male admitted with cavitary pneumonia, pleural effusion, requiring intubation (now extubated)and noted to have melena and acute drop in Hgb while inpatient with most likely differentials to include NSAID-induced gastropathy, PUD. Hgb improved after 2 units PRBCs. No further overt GI bleeding. Thoracentesis completed 10/30 with 800 ml removed but persistent moderate sized right pleural effusion post thoracentesis.   Patient feels breathing is better; he has been able to sit up in the chair for approximately an hour at a time and requires assistance of the lift to get from bed to chair. Per wife, patient was driving one month ago. Given stable Hgb and no further overt GI bleeding, would favor elective EGD prior to discharge.  Patient situation was discussed with Richard Fields yesterday morning who did not feel patient ready for conscious sedation. Patient was given regular diet and decided to re-evaluate this morning. Spoke with Richard Fields again this morning who states patient looks better, plans to transfer out of ICU.  Per his note: "Respiratory rate in the low 20s and heart rate in the 110 range this morning. Oxygen saturations are in the mid 90s on 2.5 L by nasal cannula. Lungs clear. Heart regular, tachycardic with no murmurs."  His labs today show hgb slight decline to 8.9. BMP with low calcium (but stable) and no other electrolyte  abnormalities. Cr normal.  Today he seems to continue to incrementally improve in his respiratory status. His lungs are clear. He is satting 97% on 2.5 L O2 per nasal cannula with a HR 97 while I was in the room. No apparent respiratory distress. Was OOB to chair yesterday. Working with PT/OT doing exercises without dyspnea noted. Plan to transfer to the floor this morning. No further heme noted in his stools. My main concern with his respiratory status on conscious sedation (with recent intubation/extubation for sepsis and cavitary pneumonia) is with his body habitus/size and possible complications related to that. He states he thinks he's ok for EGD.   Plan: 1. Appears to be stable for EGD but will discuss with Richard Fields. 2. Continue supportive measures 3. Monitor for obvious GI bleed 4. Monitor H/H for further decline   Thank you for allowing [place] to participate in the care of Richard Fields  Richard Field, DNP, AGNP-C Adult & Gerontological Nurse Practitioner [facility]     LOS: 15 days    03/04/2016, 8:30 AM    ADDENDUM: Spoke with Richard Fields about patient's situation. He is not a candidate for conscious sedation at this time with poor airway control, large pleural effusion, in the setting of morbid obesity. Hgb stable, no obvious further bleeding. Patient need to recover from respiratory status further before undergoing EGD and, at that time, should be done under propofol/MAC.

## 2016-03-04 NOTE — Progress Notes (Signed)
Nutrition Follow up  DOCUMENTATION CODES:  Obese Class III     INTERVENTION:  When diet is resumed: start Ensure Enlive BID  Prostat 30 ml BID  NUTRITION DIAGNOSIS:   Inadequate oral intake related to inability to eat as evidenced by Clear liquids diet unable to meet est protein and energy requirements  GOAL:   Provide needs based on ASPEN/SCCM guidelines   MONITOR:   TF tolerance, I & O's, Vent status, Labs, Weight trends   ASSESSMENT:  Patient is NPO this morning pending decision about performing EGD today. He ate 100% of Heart Healthy meal yesterday per chart. Diet fully advanced only briefly. Four days he was NPO/clear liquids which significantly limits his nutrition intake opportunities. He is complaining of being hungry and says, "I'm old and tired". He is working with PT and OT. Based on his reported good appetite expect that once he is able to maintain consistent meal intake his nutrition status will improve.  His weight is trending down 147-138 kg. He was volume overloaded per MD. Lasix and potassium was d/c. He also had a thoracentesis (-800 ml) removed.   Recent Labs Lab 03/02/16 0357 03/03/16 0355 03/04/16 0408  NA 138 135 136  K 3.2* 3.3* 4.0  CL 97* 95* 101  CO2 36* 35* 31  BUN 11 11 10   CREATININE 0.51* 0.55* 0.50*  CALCIUM 7.5* 7.6* 7.6*  GLUCOSE 165* 153* 120*     Labs: reviewed  Meds: PPI, Insulin, Remeron  Diet Order:  Diet NPO time specified  Skin: Stage II left buttock  Last BM: 11/2   Height:   Ht Readings from Last 1 Encounters:  02/23/16 5\' 11"  (1.803 m)    Weight:   Wt Readings from Last 1 Encounters:  03/04/16 (!) 304 lb 0.2 oz (137.9 kg)    Ideal Body Weight:  78 kg  BMI:  Body mass index is 42.4 kg/m.  Re- Estimated Nutritional Needs:   Kcal:  [id number]   Protein:  156-179 gr  Fluid:  per MD   EDUCATION NEEDS:   Education needs no appropriate at this time    Colman Cater MS,RD,CSG,LDN Office:  [phone] Pager: [phone]

## 2016-03-04 NOTE — Care Management Important Message (Signed)
Important Message  Patient Details  Name: Richard Fields MRN: [id number] Date of Birth: March 14, 1941   Medicare Important Message Given:  Yes    Sherald Barge, RN 03/04/2016, 12:25 PM

## 2016-03-04 NOTE — Progress Notes (Signed)
Physical Therapy Treatment Patient Details Name: Richard Fields MRN: [id number] DOB: July 18, 1940 Today's Date: 03/04/2016    History of Present Illness 75 y.o. male with a past medical history significant for DM, HLD, HTN, and peripheral neuropathy, who presents by ambulance with complaints of blackish/brownish hemoptysis that onset 1 month ago. Very poor historian. H/o mainly obtained by caregiver and wife at bedside. Per wife he has associated right shoulder pain and profuse diaphoresis overnight. He denies any fever or chills. While in the ED, he was started on vancomycin and admitted for further evaluation of cavitary pneumonia.  CT Chest: RLL ATX surrounding a non-enhancing cavitary lesion, likely a cavitary PNA. CTA Abdomen: pneumatosis of the transverse colon and a segment of the descending colon    PT Comments    Pt received in bed, and was agreeable to PT tx.  Pt expressed that he was feeling fair today, but he needs to get better so he can go home.  Pt required Max A for supine<>sit on the EOB.  He was able to tolerate sitting on the EOB for 10 min with Min A due to posterior and right lateral leaning.  He demonstrates improvement with self assistance while rolling and performing bed mobility today.  Pt required total A to return back into supine position. Will continue progress with sitting balance and transfers.  Continue to recommend SNF.    Follow Up Recommendations  SNF     Equipment Recommendations  None recommended by PT    Recommendations for Other Services       Precautions / Restrictions Precautions Precautions: Fall Precaution Comments: Due to current immobility Restrictions Weight Bearing Restrictions: No    Mobility  Bed Mobility   Bed Mobility: Supine to Sit     Supine to sit: HOB elevated;Max assist (HOB completely elevated, increased time, and use of bed pad to assist pt to sitting position on the EOB  ) Sit to supine: Total assist;Max assist    General bed mobility comments: supine scoot with bed in trendelenburg - pt is able to use UE's to pull himself up towards the [facility].    Transfers Overall transfer level:  (NA)                  Ambulation/Gait                 Stairs            Wheelchair Mobility    Modified Rankin (Stroke Patients Only)       Balance Overall balance assessment: Needs assistance Sitting-balance support: Bilateral upper extremity supported;Feet supported Sitting balance-Leahy Scale: Poor Sitting balance - Comments: Pt demonstrates posterior R lateral lean while sitting on the EOB.  Pt holding onto the bed rail on the left, but needs cues to do so.  Attempted trunk rotation to the left x2, however, pt has great difficulty with this and following commands to do this.   Postural control: Right lateral lean;Posterior lean                          Cognition Arousal/Alertness: Awake/alert Behavior During Therapy: WFL for tasks assessed/performed Overall Cognitive Status: Impaired/Different from baseline Area of Impairment: Following commands;Safety/judgement       Following Commands: Follows one step commands with increased time Safety/Judgement: Decreased awareness of safety;Decreased awareness of deficits          Exercises General Exercises - Lower Extremity Long Arc Quad: AROM;Both;10  reps;Seated;Limitations Long [employer] Limitations: Sitting on the EOB and required assistance to maintain balance while performing this task.     General Comments        Pertinent Vitals/Pain Pain Assessment: No/denies pain    Home Living                      Prior Function            PT Goals (current goals can now be found in the care plan section) Acute Rehab PT Goals Patient Stated Goal: Pt stated he wants to be a participant.  Pt states he wants to go home. PT Goal Formulation: With patient Time For Goal Achievement: 03/10/16 Potential to Achieve  Goals: Fair Progress towards PT goals: Progressing toward goals    Frequency    Min 5X/week      PT Plan Current plan remains appropriate    Co-evaluation             End of Session Equipment Utilized During Treatment: Oxygen Activity Tolerance: Patient limited by fatigue Patient left: in bed;with call bell/phone within reach     Time: 1420-1450 PT Time Calculation (min) (ACUTE ONLY): 30 min  Charges:  $Therapeutic Activity: 23-37 mins                    G Codes:      Beth Persia [place], PT, DPT X: [phone]

## 2016-03-04 NOTE — Progress Notes (Signed)
Occupational Therapy Treatment Patient Details Name: Richard Fields MRN: [id number] DOB: 06/14/1940 Today's Date: 03/04/2016    History of present illness 75 y.o. male with a past medical history significant for DM, HLD, HTN, and peripheral neuropathy, who presents by ambulance with complaints of blackish/brownish hemoptysis that onset 1 month ago. Very poor historian. H/o mainly obtained by caregiver and wife at bedside. Per wife he has associated right shoulder pain and profuse diaphoresis overnight. He denies any fever or chills. While in the ED, he was started on vancomycin and admitted for further evaluation of cavitary pneumonia.  CT Chest: RLL ATX surrounding a non-enhancing cavitary lesion, likely a cavitary PNA. CTA Abdomen: pneumatosis of the transverse colon and a segment of the descending colon   OT comments  Pt awake, agreeable to OT treatment this am. Pt slumped over to the right in bed, able to assist with straightening and sitting upright by using LUE to pull and RUE to push trunk into upright position. Pt participated in BUE exercises at bed level, pt lethargic this am and required verbal encouragement for participation and to maintain alert state. Pt reports mild discomfort on outside of heels, heels floated to eliminate pressure. Discharge to SNF remains appropriate.    Follow Up Recommendations  SNF    Equipment Recommendations  None recommended by OT       Precautions / Restrictions Precautions Precautions: Fall Precaution Comments: Due to current immobility Restrictions Weight Bearing Restrictions: No              ADL Overall ADL's : Needs assistance/impaired                                                        Cognition   Behavior During Therapy: WFL for tasks assessed/performed Overall Cognitive Status: Impaired/Different from baseline (disoriented to time and situation)                         Exercises General  Exercises - Upper Extremity Shoulder Flexion: Strengthening;Both;10 reps;Supine Shoulder Horizontal ABduction: Strengthening;Both;10 reps;Supine Shoulder Horizontal ADduction: Strengthening;Both;10 reps;Supine Elbow Flexion: Strengthening;Both;10 reps;Supine Elbow Extension: Strengthening;Both;10 reps;Supine           Pertinent Vitals/ Pain       Pain Assessment: No/denies pain     Prior Functioning/Environment              Frequency  Min 2X/week        Progress Toward Goals  OT Goals(current goals can now be found in the care plan section)  Progress towards OT goals: Progressing toward goals  Acute Rehab OT Goals Time For Goal Achievement: 03/11/16 Potential to Achieve Goals: Good ADL Goals Pt Will Perform Grooming: with mod assist;sitting Pt Will Transfer to Toilet: with mod assist;bedside commode;stand pivot transfer Pt/caregiver will Perform Home Exercise Program: Increased strength;Both right and left upper extremity;With Supervision;With written HEP provided  Plan Discharge plan remains appropriate       End of Session     Activity Tolerance Patient limited by lethargy   Patient Left in bed;with call bell/phone within reach           Time: 0839-0906 OT Time Calculation (min): 27 min  Charges: OT General Charges $OT Visit: 1 Procedure OT Treatments $Therapeutic Exercise: 23-37 mins   Magda Paganini  Ardit Danh, OTR/L  [phone] 03/04/2016, 9:10 AM

## 2016-03-04 NOTE — Progress Notes (Signed)
Subjective: He denies feeling short of breath this morning. He denies any coughing. He has had no signs of bleeding. He was able to eat yesterday.  Objective: Vital signs in last 24 hours: Vitals:   03/03/16 2038 03/04/16 0000 03/04/16 0314 03/04/16 0400  BP:      Pulse: (!) 108     Resp: (!) 22     Temp:  98.5 F (36.9 C)  97.5 F (36.4 C)  TempSrc:  Oral  Axillary  SpO2: 99%     Weight:   (!) 304 lb 0.2 oz (137.9 kg)   Height:       Weight change: -5 lb 11.7 oz (-2.6 kg)  Intake/Output Summary (Last 24 hours) at 03/04/16 0748 Last data filed at 03/04/16 0458  Gross per 24 hour  Intake              680 ml  Output             1600 ml  Net             -920 ml    Physical Exam: Respiratory rate in the low 20s and heart rate in the 110 range this morning. Oxygen saturations are in the mid 90s on 2.5 L by nasal cannula. Lungs clear. Heart regular, tachycardic with no murmurs. Abdomen soft and nontender with no palpable organomegaly. Extremities reveal no edema. Nurses report moisture-related skin damage on sacrum with no decubitus ulcer. He is unable to roll to the side for me to see it at this time.  Lab Results:    Results for orders placed or performed during the hospital encounter of 02/18/16 (from the past 24 hour(s))  Glucose, capillary     Status: Abnormal   Collection Time: 03/03/16 11:29 AM  Result Value Ref Range   Glucose-Capillary 133 (H) 65 - 99 mg/dL  Glucose, capillary     Status: Abnormal   Collection Time: 03/03/16  4:34 PM  Result Value Ref Range   Glucose-Capillary 250 (H) 65 - 99 mg/dL  Glucose, capillary     Status: Abnormal   Collection Time: 03/03/16  8:35 PM  Result Value Ref Range   Glucose-Capillary 172 (H) 65 - 99 mg/dL   Comment 1 Notify RN   Basic metabolic panel     Status: Abnormal   Collection Time: 03/04/16  4:08 AM  Result Value Ref Range   Sodium 136 135 - 145 mmol/L   Potassium 4.0 3.5 - 5.1 mmol/L   Chloride 101 101 - 111 mmol/L    CO2 31 22 - 32 mmol/L   Glucose, Bld 120 (H) 65 - 99 mg/dL   BUN 10 6 - 20 mg/dL   Creatinine, Ser 0.50 (L) 0.61 - 1.24 mg/dL   Calcium 7.6 (L) 8.9 - 10.3 mg/dL   GFR calc non Af Amer >60 >60 mL/min   GFR calc Af Amer >60 >60 mL/min   Anion gap 4 (L) 5 - 15  CBC     Status: Abnormal   Collection Time: 03/04/16  4:08 AM  Result Value Ref Range   WBC 12.4 (H) 4.0 - 10.5 K/uL   RBC 2.96 (L) 4.22 - 5.81 MIL/uL   Hemoglobin 8.9 (L) 13.0 - 17.0 g/dL   HCT 28.0 (L) 39.0 - 52.0 %   MCV 94.6 78.0 - 100.0 fL   MCH 30.1 26.0 - 34.0 pg   MCHC 31.8 30.0 - 36.0 g/dL   RDW 16.6 (H) 11.5 - 15.5 %  Platelets 321 150 - 400 K/uL     ABGS No results for input(s): PHART, PO2ART, TCO2, HCO3 in the last 72 hours.  Invalid input(s): PCO2 CULTURES Recent Results (from the past 240 hour(s))  Gram stain     Status: None   Collection Time: 02/29/16  2:44 PM  Result Value Ref Range Status   Specimen Description FLUID RIGHT PLEURAL COLLECTED BY DOCTOR  Final   Special Requests NONE  Final   Gram Stain   Final    MODERATE WBC PRESENT,BOTH PMN AND MONONUCLEAR NO ORGANISMS SEEN Performed at [facility]    Report Status 02/29/2016 FINAL  Final  Culture, body fluid-bottle     Status: None (Preliminary result)   Collection Time: 02/29/16  2:44 PM  Result Value Ref Range Status   Specimen Description FLUID RIGHT PLEURAL COLLECTED BY DOCTOR  Final   Special Requests BOTTLES DRAWN AEROBIC AND ANAEROBIC 10CC  Final   Culture NO GROWTH 3 DAYS  Final   Report Status PENDING  Incomplete   Studies/Results: No results found. Micro Results: Recent Results (from the past 240 hour(s))  Gram stain     Status: None   Collection Time: 02/29/16  2:44 PM  Result Value Ref Range Status   Specimen Description FLUID RIGHT PLEURAL COLLECTED BY DOCTOR  Final   Special Requests NONE  Final   Gram Stain   Final    MODERATE WBC PRESENT,BOTH PMN AND MONONUCLEAR NO ORGANISMS SEEN Performed at [facility]    Report Status 02/29/2016 FINAL  Final  Culture, body fluid-bottle     Status: None (Preliminary result)   Collection Time: 02/29/16  2:44 PM  Result Value Ref Range Status   Specimen Description FLUID RIGHT PLEURAL COLLECTED BY DOCTOR  Final   Special Requests BOTTLES DRAWN AEROBIC AND ANAEROBIC 10CC  Final   Culture NO GROWTH 3 DAYS  Final   Report Status PENDING  Incomplete   Studies/Results: No results found. Medications:  I have reviewed the patient's current medications Scheduled Meds: . sodium chloride   Intravenous Once  . albuterol  2.5 mg Nebulization TID  . insulin aspart  0-20 Units Subcutaneous TID AC & HS  . insulin glargine  40 Units Subcutaneous Daily  . levofloxacin  500 mg Oral Daily  . LORazepam  1 mg Oral BID  . losartan  50 mg Oral Daily  . mirtazapine  15 mg Oral QHS  . pantoprazole  40 mg Oral BID AC  . predniSONE  20 mg Oral Q breakfast  . pregabalin  150 mg Oral BID  . sodium chloride flush  3 mL Intravenous Q12H  . traZODone  100 mg Oral QHS   Continuous Infusions: . sodium chloride 75 mL/hr at 02/18/16 1425   PRN Meds:.albuterol, alum & mag hydroxide-simeth, HYDROcodone-acetaminophen, morphine injection, ondansetron **OR** ondansetron (ZOFRAN) IV, senna-docusate   Assessment/Plan: #1. Sepsis/pneumonia. Continue Levaquin. White count 12.4. #2. Hypokalemia. Resolved. Potassium 4.0. #3. Volume overload. Improved. Discontinue Lasix and potassium. #4. Upper GI bleed. Upper endoscopy pending. Pantoprazole has been modified to 40 mg by mouth twice a day. #5. Anemia. Hemoglobin 8.9. #6. Diabetes. Continue Lantus 40 units daily. Glucose 120 this morning. #7. Right pleural effusion. Status post thoracentesis. #8. Generalized weakness. Continue physical therapy. SNF following discharge. Principal Problem:   Cavitary pneumonia Active Problems:   Hemoptysis   Morbid obesity ([place])   Pneumatosis intestinalis   Pressure injury of skin   Type II  diabetes mellitus, uncontrolled ([place])  Sepsis ([place])   Elevated troponin level   Acute respiratory failure with hypoxia ([facility])   Encephalopathy, metabolic   Acute upper GI bleeding   Acute blood loss anemia   Protein-calorie malnutrition ([place])     LOS: 15 days   Weda Baumgarner 03/04/2016, 7:48 AM

## 2016-03-05 ENCOUNTER — Inpatient Hospital Stay (HOSPITAL_COMMUNITY): Payer: Medicare Other

## 2016-03-05 DIAGNOSIS — J984 Other disorders of lung: Secondary | ICD-10-CM

## 2016-03-05 DIAGNOSIS — K922 Gastrointestinal hemorrhage, unspecified: Secondary | ICD-10-CM

## 2016-03-05 DIAGNOSIS — D62 Acute posthemorrhagic anemia: Secondary | ICD-10-CM

## 2016-03-05 DIAGNOSIS — J189 Pneumonia, unspecified organism: Secondary | ICD-10-CM

## 2016-03-05 LAB — GLUCOSE, CAPILLARY
GLUCOSE-CAPILLARY: 222 mg/dL — AB (ref 65–99)
Glucose-Capillary: 165 mg/dL — ABNORMAL HIGH (ref 65–99)
Glucose-Capillary: 255 mg/dL — ABNORMAL HIGH (ref 65–99)

## 2016-03-05 LAB — COMPREHENSIVE METABOLIC PANEL
ALK PHOS: 95 U/L (ref 38–126)
ALT: 19 U/L (ref 17–63)
ANION GAP: 6 (ref 5–15)
AST: 16 U/L (ref 15–41)
Albumin: 2.3 g/dL — ABNORMAL LOW (ref 3.5–5.0)
BILIRUBIN TOTAL: 1.2 mg/dL (ref 0.3–1.2)
BUN: 13 mg/dL (ref 6–20)
CALCIUM: 7.8 mg/dL — AB (ref 8.9–10.3)
CO2: 29 mmol/L (ref 22–32)
Chloride: 97 mmol/L — ABNORMAL LOW (ref 101–111)
Creatinine, Ser: 0.78 mg/dL (ref 0.61–1.24)
Glucose, Bld: 250 mg/dL — ABNORMAL HIGH (ref 65–99)
Potassium: 4.4 mmol/L (ref 3.5–5.1)
Sodium: 132 mmol/L — ABNORMAL LOW (ref 135–145)
TOTAL PROTEIN: 5.9 g/dL — AB (ref 6.5–8.1)

## 2016-03-05 LAB — CULTURE, BODY FLUID W GRAM STAIN -BOTTLE

## 2016-03-05 LAB — CBC
HEMATOCRIT: 31.4 % — AB (ref 39.0–52.0)
HEMOGLOBIN: 9.9 g/dL — AB (ref 13.0–17.0)
MCH: 29.7 pg (ref 26.0–34.0)
MCHC: 31.5 g/dL (ref 30.0–36.0)
MCV: 94.3 fL (ref 78.0–100.0)
Platelets: 350 10*3/uL (ref 150–400)
RBC: 3.33 MIL/uL — ABNORMAL LOW (ref 4.22–5.81)
RDW: 16.3 % — ABNORMAL HIGH (ref 11.5–15.5)
WBC: 14.3 10*3/uL — ABNORMAL HIGH (ref 4.0–10.5)

## 2016-03-05 LAB — TROPONIN I
TROPONIN I: 0.11 ng/mL — AB (ref <0.03)
Troponin I: 0.13 ng/mL (ref <0.03)

## 2016-03-05 LAB — TSH: TSH: 0.673 u[IU]/mL (ref 0.350–4.500)

## 2016-03-05 LAB — CULTURE, BODY FLUID-BOTTLE: CULTURE: NO GROWTH

## 2016-03-05 LAB — D-DIMER, QUANTITATIVE: D-Dimer, Quant: 2.35 ug/mL-FEU — ABNORMAL HIGH (ref 0.00–0.50)

## 2016-03-05 LAB — LACTIC ACID, PLASMA: Lactic Acid, Venous: 1.4 mmol/L (ref 0.5–1.9)

## 2016-03-05 MED ORDER — NYSTATIN 100000 UNIT/GM EX CREA
TOPICAL_CREAM | CUTANEOUS | Status: AC
  Filled 2016-03-05: qty 15

## 2016-03-05 MED ORDER — ALTEPLASE 2 MG IJ SOLR
2.0000 mg | Freq: Once | INTRAMUSCULAR | Status: AC
  Administered 2016-03-05: 2 mg
  Filled 2016-03-05: qty 2

## 2016-03-05 MED ORDER — NYSTATIN 100000 UNIT/GM EX CREA
TOPICAL_CREAM | Freq: Three times a day (TID) | CUTANEOUS | Status: DC
  Administered 2016-03-05 (×2): via TOPICAL
  Administered 2016-03-06: 1 via TOPICAL
  Administered 2016-03-06: 09:00:00 via TOPICAL
  Filled 2016-03-05: qty 15

## 2016-03-05 MED ORDER — IOPAMIDOL (ISOVUE-370) INJECTION 76%
125.0000 mL | Freq: Once | INTRAVENOUS | Status: AC | PRN
  Administered 2016-03-05: 125 mL via INTRAVENOUS

## 2016-03-05 MED ORDER — METOPROLOL TARTRATE 25 MG PO TABS
12.5000 mg | ORAL_TABLET | Freq: Once | ORAL | Status: AC
  Administered 2016-03-05: 12.5 mg via ORAL
  Filled 2016-03-05: qty 1

## 2016-03-05 MED ORDER — PREDNISONE 10 MG PO TABS
15.0000 mg | ORAL_TABLET | Freq: Every day | ORAL | Status: DC
  Administered 2016-03-06: 15 mg via ORAL
  Filled 2016-03-05: qty 2

## 2016-03-05 MED ORDER — SODIUM CHLORIDE 0.9 % IV BOLUS (SEPSIS)
250.0000 mL | Freq: Once | INTRAVENOUS | Status: AC
  Administered 2016-03-05: 250 mL via INTRAVENOUS

## 2016-03-05 NOTE — Progress Notes (Signed)
Notified CT of order for CT angio of chest. Donavan Foil, RN

## 2016-03-05 NOTE — Progress Notes (Signed)
Pt elevated troponin 0.11. Pt HR 110. BP stable. Kim MD paged. Verbal order for Echo and Metoprolol 12.5 PO once. Oswald Hillock, RN

## 2016-03-05 NOTE — Progress Notes (Signed)
   03/05/16 1148  Vitals  Temp 98.5 F (36.9 C)  Temp Source Oral  BP 126/64  BP Location Right Arm  BP Method Automatic  Patient Position (if appropriate) Lying  Pulse Rate (!) 115  Pulse Rate Source Dinamap  Resp 16  Pt. BP improved from this morning when it was 86/42.

## 2016-03-05 NOTE — Progress Notes (Signed)
Notified Dr. Maudie Mercury of elevated d-dimer 2.35, chest x-ray results, elevated WBC 14.3, troponin 0.13, lactic acid 1.4. Notified BP 126/64 after receiving NS 250 ml bolus as ordered. MD aware pt remains lethargic but arousable. CT angio order placed as ordered for elevated d-dimer. Pt to remain on this unit at this time. Nursing to monitor and notify him of results. Donavan Foil, RN

## 2016-03-05 NOTE — Progress Notes (Signed)
Pt transferred to SDU from 300; NAD; VSS; no new orders received.

## 2016-03-05 NOTE — Progress Notes (Signed)
Pt transferred down to stepdown unit per order of Dr. Maudie Mercury. Report given to [place], Therapist, sports.

## 2016-03-05 NOTE — Progress Notes (Signed)
Notified Dr. Maudie Mercury of CT angio chest results. Stated will discuss with Dr. Luan Pulling in the am. No new orders at this time. Donavan Foil, RN

## 2016-03-05 NOTE — Consult Note (Signed)
PICC line reported to being having difficulty to flush and draw. All 3 lumens the caps were changed and checked for blood return and flushing. All lumens work well and MD and bedside RN are aware. Dressing is clean, dry intact. PICC site WNL.

## 2016-03-05 NOTE — Progress Notes (Signed)
PICC line site assessed by Vascular nurse. All 3 ports flushed and patent.

## 2016-03-05 NOTE — Progress Notes (Signed)
Patient BP low this am. Order to receive NS 250 ml bolus x 1. PICC line to left upper arm will not flush, no blood return to all 3 lumens. Pt lethargic, but easily arousable. Notified nursing supervisor. Cathflo ordered per PICC maintenance protocol. EKG results called to Dr. Maudie Mercury. Notified of PICC line maintenance and attempting to gain peripheral IV access for bolus. Order received to add lactic acid to labs. Nursing to monitor. Donavan Foil, RN

## 2016-03-05 NOTE — Progress Notes (Signed)
  Subjective:  Patient complains of being stuck too many times. He denies chest pain shortness of breath. He has not experienced hemoptysis today. He denies hematemesis. States she's not had a bowel movement today. He states he ate all of his breakfast. He feels he has a good appetite.  Objective: Blood pressure 126/64, pulse (!) 115, temperature 98.5 F (36.9 C), temperature source Oral, resp. rate 16, height 5\' 11"  (1.803 m), weight (!) 304 lb 0.2 oz (137.9 kg), SpO2 97 %. Patient is awake and responds appropriately to questions. He appears comfortable in bed. He appears pale. Abdomen is obese but soft and nontender without organomegaly or masses. No LE edema or clubbing noted.  Labs/studies Results:   Recent Labs  03/03/16 0355 03/04/16 0408 03/05/16 1101  WBC 14.4* 12.4* 14.3*  HGB 9.1* 8.9* 9.9*  HCT 28.2* 28.0* 31.4*  PLT 295 321 350    BMET   Recent Labs  03/03/16 0355 03/04/16 0408  NA 135 136  K 3.3* 4.0  CL 95* 101  CO2 35* 31  GLUCOSE 153* 120*  BUN 11 10  CREATININE 0.55* 0.50*  CALCIUM 7.6* 7.6*     Assessment:  #1. Anemia. Anemia appears to be multifactorial i.e. secondary to acute illness. He was noted to have melena and his stool was heme positive. He is at risk for peptic ulcer disease due to acute illness or NSAID use. Has received 2 units of PRBCs during this admission and his H&H is coming up. He is on PPI. He will undergo EGD as we early next week. #2. Cavitary pneumonia with respiratory failure requiring intubation for 3 days. He is now on Levaquin by mouth. #3. Transient hypotension this morning. He responded to IV normal saline bolus. Troponin levels are checked.   Recommendations:  Continue pantoprazole at current dose of 40 mg twice a day. EGD under monitored anesthesia care prior to discharge.

## 2016-03-05 NOTE — Progress Notes (Signed)
Patient ID: Richard Fields, male   DOB: 1940/10/30, 75 y.o.   MRN: [id number]                                                                PROGRESS NOTE                                                                                                                                                                                                             Patient Demographics:    Richard Fields, is a 75 y.o. male, DOB - 05/06/1940, [id number]  Admit date - 02/18/2016   Admitting Physician Erline Hau, MD  Outpatient Primary MD for the patient is Asencion Noble, MD  LOS - 16  Outpatient Specialists:  Chief Complaint  Patient presents with  . Hematemesis       Brief Narrative   75 y.o. male with a past medical history significant for DM, HLD, HTN, and peripheral neuropathy, who presents by ambulance with complaints of blackish/brownish hemoptysis that onset 1 month ago. Very poor historian. H/o mainly obtained by caregiver and wife at bedside. Per wife he has associated right shoulder pain and profuse diaphoresis overnight. He denies any fever or chills. While in the ED, he was started on vancomycin and admitted for further evaluation of cavitary pneumonia.   ED Course: Glucose 292, Troponin 0.11, WBC 19.9, D-dimer 0.92, CXR shows right lung base atelectatic versus infiltrate. CT Chest: RLL ATX surrounding a non-enhancing cavitary lesion, likely a cavitary PNA. CTA Abdomen: pneumatosis of the transverse colon and a segment of the descending colon. No free intraperitoneal air.    Subjective:    Richard Fields today denies fever, chills, cp, palp, sob.  Pt is on o2 [place] and doing well.  Pt eating this am, and sitting up in bed.    Assessment  & Plan :    Principal Problem:   Cavitary pneumonia Active Problems:   Hemoptysis   Morbid obesity ([place])   Pneumatosis intestinalis   Pressure injury of skin   Type II diabetes mellitus, uncontrolled (HCC)   Sepsis (HCC)    Elevated troponin level   Acute respiratory failure with hypoxia (HCC)   Encephalopathy, metabolic   Acute upper GI bleeding   Acute blood loss anemia   Protein-calorie malnutrition ([place])  Abdominal pain, right lateral   Aspirin long-term use   Hypoxia   Pleural effusion on right   S/P thoracentesis   SOB (shortness of breath)   1.  Cavitary pneumonia/ sepsis Cont levaquin  2. R pleural effusion, s/p thoracentesis 10/30 Culture negative  3. Anemia No  Cbc results for this am, pt appears hemodynamically stable Cbc in am Appreciate GI input.   4. UGI bleeding Cont protonix Gi on board  5. Dm2 Cont lantus, and ssi  6. Copd Decrease prednisone to 15mg  po qday  7. Deconditioning PT to evaluate and tx, thanks  D/c Foley   Code Status : FULL CODE  Family Communication  : w patient  Disposition Plan  :  SNF  Barriers For Discharge :   Consults  :  Pulmonary, PT  Procedures  : thoracentesis 10/30  DVT Prophylaxis  :  Lovenox - Heparin - SCDs   Lab Results  Component Value Date   PLT 321 03/04/2016    Antibiotics  :   Anti-infectives    Start     Dose/Rate Route Frequency Ordered Stop   03/01/16 1000  levofloxacin (LEVAQUIN) tablet 500 mg     500 mg Oral Daily 03/01/16 0908     02/21/16 1600  vancomycin (VANCOCIN) 1,250 mg in sodium chloride 0.9 % 250 mL IVPB  Status:  Discontinued     1,250 mg 166.7 mL/hr over 90 Minutes Intravenous Every 8 hours 02/21/16 0855 02/21/16 0857   02/21/16 0900  vancomycin (VANCOCIN) 1,250 mg in sodium chloride 0.9 % 250 mL IVPB  Status:  Discontinued     1,250 mg 166.7 mL/hr over 90 Minutes Intravenous Every 8 hours 02/21/16 0857 03/01/16 0907   02/19/16 1600  vancomycin (VANCOCIN) IVPB 1000 mg/200 mL premix  Status:  Discontinued     1,000 mg 200 mL/hr over 60 Minutes Intravenous Every 8 hours 02/19/16 0758 02/21/16 0855   02/19/16 0800  vancomycin (VANCOCIN) 1,500 mg in sodium chloride 0.9 % 500 mL IVPB     1,500  mg 250 mL/hr over 120 Minutes Intravenous  Once 02/19/16 0758 02/19/16 1127   02/18/16 1700  vancomycin (VANCOCIN) IVPB 1000 mg/200 mL premix  Status:  Discontinued     1,000 mg 200 mL/hr over 60 Minutes Intravenous Every 8 hours 02/18/16 1129 02/18/16 1328   02/18/16 1600  imipenem-cilastatin (PRIMAXIN) 500 mg in sodium chloride 0.9 % 100 mL IVPB  Status:  Discontinued     500 mg 200 mL/hr over 30 Minutes Intravenous Every 6 hours 02/18/16 1358 03/01/16 0907   02/18/16 0900  vancomycin (VANCOCIN) 1,500 mg in sodium chloride 0.9 % 500 mL IVPB     1,500 mg 250 mL/hr over 120 Minutes Intravenous  Once 02/18/16 0816 02/18/16 1250   02/18/16 0700  levofloxacin (LEVAQUIN) IVPB 750 mg     750 mg 100 mL/hr over 90 Minutes Intravenous  Once 02/18/16 0651 02/18/16 0957        Objective:   Vitals:   03/04/16 1920 03/04/16 2034 03/05/16 0638 03/05/16 0755  BP:  135/75 134/63   Pulse:  (!) 102 (!) 116   Resp:  20 20   Temp:  98.2 F (36.8 C) 97.6 F (36.4 C)   TempSrc:  Oral Oral   SpO2: 92% 93% 94% (!) 87%  Weight:      Height:        Wt Readings from Last 3 Encounters:  03/04/16 (!) 137.9 kg (304 lb 0.2 oz)  12/02/15  129.3 kg (285 lb)  10/12/15 127 kg (280 lb)     Intake/Output Summary (Last 24 hours) at 03/05/16 0837 Last data filed at 03/05/16 [id number]  Gross per 24 hour  Intake                9 ml  Output              900 ml  Net             -891 ml     Physical Exam  Awake Alert, Oriented X 3, No new F.N deficits, Normal affect [place].AT,PERRAL Supple Neck,No JVD, No cervical lymphadenopathy appriciated.  Symmetrical Chest wall movement, Good air movement bilaterally, decrease in BS right lung base  No crackle, no wheeze RRR,No Gallops,Rubs or new Murmurs, No Parasternal Heave +ve B.Sounds, Abd Soft, No tenderness, No organomegaly appriciated, No rebound - guarding or rigidity. No Cyanosis, Clubbing or edema, No new Rash or bruise  Foley catheter in place    Data Review:     CBC  Recent Labs Lab 02/28/16 0349 02/29/16 0400 02/29/16 1557 03/01/16 0357 03/02/16 0357 03/03/16 0355 03/04/16 0408  WBC 17.2* 15.3*  --  13.8* 12.6* 14.4* 12.4*  HGB 6.4* 8.5* 8.6* 8.1* 8.0* 9.1* 8.9*  HCT 19.9* 25.5* 26.5* 25.2* 24.9* 28.2* 28.0*  PLT 212 241  --  240 251 295 321  MCV 94.8 90.4  --  92.3 92.9 93.4 94.6  MCH 30.5 30.1  --  29.7 29.9 30.1 30.1  MCHC 32.2 33.3  --  32.1 32.1 32.3 31.8  RDW 13.7 15.7*  --  15.8* 15.9* 16.3* 16.6*  LYMPHSABS 0.8 0.8  --  0.9 1.8 1.9  --   MONOABS 0.9 0.8  --  0.7 1.0 1.2*  --   EOSABS 0.0 0.0  --  0.0 0.0 0.1  --   BASOSABS 0.0 0.0  --  0.0 0.0 0.0  --     Chemistries   Recent Labs Lab 02/28/16 0349 02/29/16 0400 03/01/16 0357 03/02/16 0357 03/03/16 0355 03/04/16 0408  NA 141 137 136 138 135 136  K 3.6 3.5 4.0 3.2* 3.3* 4.0  CL 104 97* 98* 97* 95* 101  CO2 34* 34* 35* 36* 35* 31  GLUCOSE 230* 247* 254* 165* 153* 120*  BUN 19 16 15 11 11 10   CREATININE 0.62 0.59* 0.51* 0.51* 0.55* 0.50*  CALCIUM 7.2* 7.2* 7.2* 7.5* 7.6* 7.6*  AST 21  --   --   --   --   --   ALT 19  --   --   --   --   --   ALKPHOS 72  --   --   --   --   --   BILITOT 0.5  --   --   --   --   --    ------------------------------------------------------------------------------------------------------------------ No results for input(s): CHOL, HDL, LDLCALC, TRIG, CHOLHDL, LDLDIRECT in the last 72 hours.  Lab Results  Component Value Date   HGBA1C 8.7 (H) 02/18/2016   ------------------------------------------------------------------------------------------------------------------ No results for input(s): TSH, T4TOTAL, T3FREE, THYROIDAB in the last 72 hours.  Invalid input(s): FREET3 ------------------------------------------------------------------------------------------------------------------ No results for input(s): VITAMINB12, FOLATE, FERRITIN, TIBC, IRON, RETICCTPCT in the last 72 hours.  Coagulation profile No results for  input(s): INR, PROTIME in the last 168 hours.  No results for input(s): DDIMER in the last 72 hours.  Cardiac Enzymes No results for input(s): CKMB, TROPONINI, MYOGLOBIN in the last 168 hours.  Invalid  input(s): CK ------------------------------------------------------------------------------------------------------------------    Component Value Date/Time   BNP 50.0 02/18/2016 0517    Inpatient Medications  Scheduled Meds: . sodium chloride   Intravenous Once  . albuterol  2.5 mg Nebulization TID  . insulin aspart  0-20 Units Subcutaneous TID AC & HS  . insulin glargine  40 Units Subcutaneous Daily  . levofloxacin  500 mg Oral Daily  . LORazepam  1 mg Oral BID  . losartan  50 mg Oral Daily  . mirtazapine  15 mg Oral QHS  . pantoprazole  40 mg Oral BID AC  . predniSONE  20 mg Oral Q breakfast  . pregabalin  150 mg Oral BID  . sodium chloride flush  3 mL Intravenous Q12H  . traZODone  100 mg Oral QHS   Continuous Infusions: . sodium chloride 10 mL/hr at 03/04/16 0849   PRN Meds:.albuterol, alum & mag hydroxide-simeth, HYDROcodone-acetaminophen, morphine injection, ondansetron **OR** ondansetron (ZOFRAN) IV, senna-docusate  Micro Results Recent Results (from the past 240 hour(s))  Gram stain     Status: None   Collection Time: 02/29/16  2:44 PM  Result Value Ref Range Status   Specimen Description FLUID RIGHT PLEURAL COLLECTED BY DOCTOR  Final   Special Requests NONE  Final   Gram Stain   Final    MODERATE WBC PRESENT,BOTH PMN AND MONONUCLEAR NO ORGANISMS SEEN Performed at [facility]    Report Status 02/29/2016 FINAL  Final  Culture, body fluid-bottle     Status: None (Preliminary result)   Collection Time: 02/29/16  2:44 PM  Result Value Ref Range Status   Specimen Description FLUID RIGHT PLEURAL COLLECTED BY DOCTOR  Final   Special Requests BOTTLES DRAWN AEROBIC AND ANAEROBIC 10CC  Final   Culture NO GROWTH 4 DAYS  Final   Report Status PENDING   Incomplete    Radiology Reports Dg Chest 1 View  Result Date: 02/19/2016 CLINICAL DATA:  Shortness of breath for several weeks EXAM: CHEST 1 VIEW COMPARISON:  02/18/2016 FINDINGS: Cardiac shadow is enlarged. Right-sided pleural effusion is increasing from the prior exam. No focal infiltrate is seen in the left lung. No bony abnormality is noted. IMPRESSION: Increasing right-sided pleural effusion. Electronically Signed   By: Inez Catalina M.D.   On: 02/19/2016 09:28   Ct Chest W Contrast  Result Date: 02/29/2016 CLINICAL DATA:  Pneumonia, intubated EXAM: CT CHEST WITH CONTRAST TECHNIQUE: Multidetector CT imaging of the chest was performed during intravenous contrast administration. CONTRAST:  75mL ISOVUE-300 IOPAMIDOL (ISOVUE-300) INJECTION 61% COMPARISON:  02/17/2016 FINDINGS: Cardiovascular: No significant vascular findings. Normal heart size. No pericardial effusion. Coronary artery atherosclerosis in the LAD and circumflex. Mediastinum/Nodes: No enlarged mediastinal, hilar, or axillary lymph nodes. Thyroid gland, trachea, and esophagus demonstrate no significant findings. Lungs/Pleura: Large right pleural effusion is severe compressive atelectasis with only a small area of lung at the apex. No left pleural effusion. No pneumothorax. Upper Abdomen: No acute upper abdominal abnormality. Musculoskeletal: No acute osseous abnormality. No lytic or sclerotic osseous lesion. IMPRESSION: 1. Large right pleural effusion with severe compressive atelectasis with only a small area of aerated lung at the apex. 2. Coronary artery atherosclerosis. Electronically Signed   By: Kathreen Devoid   On: 02/29/2016 13:12   Ct Angio Chest Pe W/cm &/or Wo Cm  Result Date: 02/18/2016 CLINICAL DATA:  Hemoptysis for 2-3 days.  Back pain. EXAM: CT ANGIOGRAPHY CHEST WITH CONTRAST TECHNIQUE: Multidetector CT imaging of the chest was performed using the standard protocol during bolus  administration of intravenous contrast.  Multiplanar CT image reconstructions and MIPs were obtained to evaluate the vascular anatomy. CONTRAST:  100 cc Isovue 370 intravenous COMPARISON:  None. FINDINGS: Cardiovascular: CTA of the pulmonary arteries is limited by motion and suboptimal opacification. Apparent filling defect in a subsegmental left upper lobe branch, 6:121, is an area of motion and favored artifactual. The low-density is also not at a branch point. No filling defect is identified. No cardiomegaly or pericardial effusion. No acute aortic finding. Diffuse atherosclerotic calcification of the aorta and coronaries. Mediastinum/Nodes: Prominent right paratracheal lymph node of 11 mm short axis, incidental in isolation. Lungs/Pleura: There is atelectasis at the right base, multi segment, with a ~ 3 cm nonenhancing area in the lower lobe that has central lucency. There is small right pleural effusion that is small, with mild posterior chest loculation. No edema. Upper Abdomen: Hepatic steatosis Musculoskeletal: Degenerative changes without acute or aggressive finding. Review of the MIP images confirms the above findings. IMPRESSION: 1. Limited CTA with no definitive pulmonary embolism. There is an apparent filling defect within a subsegmental left upper lobe branch, but favored artifactual. Consider lower extremity Dopplers. 2. Right lower lobe atelectasis surrounding a nonenhancing cavitary focus, likely a cavitary pneumonia. Right pleural effusion is small but has areas of early loculation. 3. After completed treatment, recommend CT follow-up. Electronically Signed   By: Monte Fantasia M.D.   On: 02/18/2016 07:44   [place] Chest  Result Date: 02/19/2016 CLINICAL DATA:  Abnormal chest radiograph, RIGHT pleural effusion EXAM: CHEST ULTRASOUND COMPARISON:  Chest radiograph 02/19/2016 FINDINGS: Only a minimal amount of RIGHT pleural fluid is identified. Consolidated lower RIGHT lung is seen. Volume of fluid visualized is insufficient for  thoracentesis. IMPRESSION: RIGHT lower lobe consolidation. Minimal RIGHT pleural effusion. Discussed with Dr. Luan Pulling. Electronically Signed   By: Lavonia Dana M.D.   On: 02/19/2016 14:07   Dg Chest Port 1 View  Result Date: 02/29/2016 CLINICAL DATA:  RIGHT pleural effusion post thoracentesis EXAM: PORTABLE CHEST 1 VIEW COMPARISON:  Portable exam 1521 hours compared to 02/27/2016 chest radiograph and CT chest of 02/29/2016 FINDINGS: Enlargement of cardiac silhouette with vascular congestion. Persistent moderate-sized RIGHT pleural effusion despite thoracentesis. No pneumothorax. Compressive atelectasis of RIGHT middle and RIGHT lower lobes. LEFT lung grossly clear. Osseous structures unremarkable. IMPRESSION: No pneumothorax following RIGHT thoracentesis. Persistent RIGHT pleural effusion with significant atelectasis of the lower RIGHT lung. Enlargement of cardiac silhouette with pulmonary vascular congestion. Electronically Signed   By: Lavonia Dana M.D.   On: 02/29/2016 15:38   Dg Chest Port 1 View  Result Date: 02/27/2016 CLINICAL DATA:  Pneumonia. EXAM: PORTABLE CHEST 1 VIEW COMPARISON:  02/26/2016. FINDINGS: Stable enlarged cardiac silhouette no significant change in a moderate to large-sized right pleural effusion. The left lung remains clear. The left PICC tip remains in the region of the origin of the superior vena cava. Unremarkable bones. IMPRESSION: 1. Stable moderate to large-sized right pleural effusion and cardiomegaly. 2. Left PICC tip at the origin of the superior vena cava. Electronically Signed   By: Claudie Revering M.D.   On: 02/27/2016 10:17   Dg Chest Port 1 View  Result Date: 02/26/2016 CLINICAL DATA:  Pt couldn't verbally explain reason for exam. Tech and nurse techs assisted to try and stable pt upright to get sufficient AP view of chest. Reason for exam is pneumonia. EXAM: PORTABLE CHEST 1 VIEW COMPARISON:  02/22/2016 FINDINGS: Opacity at the right lung base has increased  consistent with enlargement of the  right pleural effusion, now moderate to large in size. No convincing left pleural effusion. No pneumothorax. No evidence of pulmonary edema.  No definite pneumonia. Cardiac silhouette is enlarged but stable. Left PICC has its tip at the left brachiocephalic vein superior vena cava confluence. IMPRESSION: 1. Increased right pleural effusion, now moderate to large. 2. Convincing pneumonia.  No pulmonary edema. Electronically Signed   By: Lajean Manes M.D.   On: 02/26/2016 10:04   Dg Chest Port 1 View  Result Date: 02/22/2016 CLINICAL DATA:  Respiratory failure. EXAM: PORTABLE CHEST 1 VIEW COMPARISON:  Radiographs of February 21, 2016. FINDINGS: Stable cardiomediastinal silhouette. Endotracheal and nasogastric tubes are in grossly good position. No pneumothorax is noted. Stable moderate right pleural effusion is noted with probable underlying atelectasis or infiltrate. Stable central pulmonary vascular congestion. Stable position of left subclavian catheter with distal tip in expected position of the SVC. Bony thorax is unremarkable. Stable minimal left basilar subsegmental atelectasis. IMPRESSION: Stable minimal left basilar subsegmental atelectasis. Stable moderate right pleural effusion with probable underlying atelectasis or infiltrate. Support apparatus in grossly good position. Electronically Signed   By: Marijo Conception, M.D.   On: 02/22/2016 10:09   Dg Chest Port 1 View  Result Date: 02/21/2016 CLINICAL DATA:  Respiratory failure EXAM: PORTABLE CHEST 1 VIEW COMPARISON:  02/20/2016 FINDINGS: Cardiomegaly again noted. Stable NG tube and endotracheal tube position. Central vascular congestion. Persistent right pleural effusion with atelectasis or infiltrate in right lower lobe. Small left pleural effusion with left basilar atelectasis. No convincing pulmonary edema. Left arm PICC line is unchanged in position. IMPRESSION: Stable NG tube and endotracheal tube position.  Central vascular congestion. Persistent right pleural effusion with atelectasis or infiltrate in right lower lobe. Small left pleural effusion with left basilar atelectasis. No convincing pulmonary edema. Electronically Signed   By: Lahoma Crocker M.D.   On: 02/21/2016 09:33   Dg Chest Port 1 View  Result Date: 02/20/2016 CLINICAL DATA:  Respiratory failure.  Evaluate endotracheal tube. EXAM: PORTABLE CHEST 1 VIEW COMPARISON:  02/19/2016. FINDINGS: Endotracheal tube is 5.6 cm above the carina and appropriately positioned. A nasogastric tube extends into the abdomen. Lower chest is not completely visualized. Patient rotated towards the right. Left lung appears clear. There continues to be right pleural fluid which is probably loculated. Persistent densities at the right chest base could represent atelectasis. Heart size is grossly stable. IMPRESSION: Endotracheal tube is appropriately positioned above the carina. Persistent pleural-parenchymal disease in the right lung with volume loss. Electronically Signed   By: Markus Daft M.D.   On: 02/20/2016 11:11   Dg Chest Port 1 View  Result Date: 02/18/2016 CLINICAL DATA:  75 year old male with shortness of breath and hemoptysis. EXAM: PORTABLE CHEST 1 VIEW COMPARISON:  Chest radiograph dated 08/20/2014 FINDINGS: There is shallow inspiration. Mild diffuse interstitial prominence and crowding as well as mild bronchiectatic changes. Right lung base hazy density likely atelectatic changes. Developing infiltrate is not excluded. Clinical correlation is recommended. There is no pleural effusion or pneumothorax. Stable mild cardiomegaly. No acute osseous pathology. IMPRESSION: Right lung base atelectatic changes versus less likely infiltrate. Clinical correlation is recommended. Electronically Signed   By: Anner Crete M.D.   On: 02/18/2016 06:23   Dg Chest Port 1v Same Day  Result Date: 02/20/2016 CLINICAL DATA:  Status post PICC line placement. EXAM: PORTABLE  CHEST 1 VIEW COMPARISON:  02/20/2016 at 1050 hours FINDINGS: Endotracheal tube is 5 cm above the carina. Nasogastric tube extends to the abdomen  but the tip is beyond the image. Again noted is right pleural fluid extending up the lateral aspect of the right chest. There continues to be consolidation and volume loss in the right lower lung. Stable enlargement of the cardiac silhouette. A left arm PICC line has been placed. Catheter tip has a horizontal orientation and probably at the junction of the upper SVC and left innominate vein. IMPRESSION: PICC line tip near the junction of the left innominate vein and SVC. Persistent pleural and parenchymal disease in the right chest. Minimal change from the recent comparison examination. Electronically Signed   By: Markus Daft M.D.   On: 02/20/2016 12:55   Dg Shoulder Right Portable  Result Date: 02/18/2016 CLINICAL DATA:  Initial evaluation for acute right shoulder pain. No injury. EXAM: PORTABLE RIGHT SHOULDER COMPARISON:  None. FINDINGS: No acute fracture or dislocation. Humeral head in normal alignment with the glenoid. AC joint approximated. Degenerative osteoarthritic changes present about the [facility] joint. No periarticular calcification. Osseous mineralization normal. No soft tissue abnormality. IMPRESSION: 1. No acute osseous abnormality about the right shoulder. 2. 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 Electronically Signed   By: Jeannine Boga M.D.   On: 02/18/2016 06:24   Ct Renal Stone Study  Result Date: 02/18/2016 CLINICAL DATA:  Acute right flank pain. EXAM: CT ABDOMEN AND PELVIS WITHOUT CONTRAST TECHNIQUE: Multidetector CT imaging of the abdomen and pelvis was performed following the standard protocol without IV contrast. However, exam is somewhat limited due to respiratory motion artifact. COMPARISON:  None. FINDINGS: Lower chest: Right posterior basilar atelectasis or inflammation is noted with possible  associated pleural effusion. Left lung base is clear. Hepatobiliary: No gallstones are noted. Possible mild fatty infiltration is noted of the liver. Pancreas: Normal. Spleen: Normal. Adrenals/Urinary Tract: Adrenal glands and kidneys are unremarkable. No hydronephrosis or renal obstruction is noted. No renal or ureteral calculi are noted. Urinary bladder appears normal. Stomach/Bowel: There is no evidence of bowel obstruction. However, there does appear to be pneumatosis involving a portion of the transverse colon as well as a portion of the proximal descending colon. This potentially may represent benign pneumatosis, but ischemic bowel must be considered. Vascular/Lymphatic: Atherosclerosis of abdominal aorta is noted without aneurysm formation. No significant adenopathy is noted. Reproductive: Normal prostate gland. Other: No abnormal fluid collection is noted. 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 Possible pars defects is seen at L5. Well-defined lucencies are noted in the L3 and L4 vertebral bodies which may represent hemangiomas, but metastatic disease cannot be excluded. IMPRESSION: No hydronephrosis or renal obstruction is noted. No renal or ureteral calculi are noted. Right posterior basilar atelectasis or inflammation is noted with possible associated pleural effusion. Probable mild fatty infiltration of the liver. Aortic atherosclerosis. Well-defined lucencies are noted in the L3 and L4 vertebral bodies which may represent hemangiomas, but MRI with and without gadolinium is recommended of the lumbar spine to rule out malignancy or metastatic disease. Pneumatosis is noted in the transverse colon as well as proximal descending colon. While this may represent benign pneumatosis, ischemic bowel cannot be excluded and clinical correlation and surgical consultation is recommended. Critical Value/emergent results were called by telephone at the time of  interpretation on 02/18/2016 at 7:24 am to Dr. Rolland Porter , who verbally acknowledged these results. Electronically Signed   By: Marijo Conception, M.D.   On: 02/18/2016 07:25   Ct Angio Abd/pel W And/or Wo Contrast  Addendum Date:  02/18/2016   ADDENDUM REPORT: 02/18/2016 12:41 ADDENDUM: Small right pleural effusion with compressive atelectasis at the right lung base. There is a small focus of low-density and gas within this collapsed right lower lobe. This is seen on sequence 5, image 9. This area measures roughly 2.2 cm. This could represent a small focus of lung necrosis or cavitary pneumonia. This was seen on the recent chest CT from 02/18/2016. Electronically Signed   By: Markus Daft M.D.   On: 02/18/2016 12:41   Result Date: 02/18/2016 CLINICAL DATA:  Follow-up colonic pneumatosis. EXAM: CT ANGIOGRAPHY ABDOMEN AND PELVIS WITH CONTRAST AND WITHOUT CONTRAST TECHNIQUE: Multidetector CT imaging of the abdomen and pelvis was performed using the standard protocol during bolus administration of intravenous contrast. Multiplanar reconstructed images and MIPs were obtained and reviewed to evaluate the vascular anatomy. CONTRAST:  130mL ISOVUE-300 IOPAMIDOL (ISOVUE-300) INJECTION 61% COMPARISON:  CT 02/18/2016 FINDINGS: VASCULAR Aorta: Poor opacification of the arterial structures due to technical difficulties with this examination. Normal caliber of the abdominal aorta with atherosclerotic disease. No evidence for an aortic dissection. Celiac: Celiac trunk is patent without significant stent stenosis at the origin. The main branch vessels are patent. SMA: The SMA is patent with some atherosclerotic plaque at the origin. There does not appear to be a critical stenosis but limited evaluation. Renals: Bilateral renal arteries are patent with calcified plaque at the origin. There does not appear to be critical stenosis but limited evaluation. IMA: Proximal IMA appears to be patent. Inflow: Iliac arteries are calcified  without significant aneurysm. Iliac arteries and proximal femoral arteries appear to be patent. Veins: IVC, renal veins and portal venous system are patent. Review of the MIP images confirms the above findings. NON-VASCULAR Lower chest: Again noted is atelectasis and/or scarring at the left lung base. Small right pleural effusion with compressive atelectasis in the right lower lobe. Coronary artery calcifications. Hepatobiliary: Normal appearance of the liver, gallbladder and portal venous system. Pancreas: Normal appearance of the pancreas without inflammation or duct dilatation. Spleen: Normal appearance of spleen without enlargement. Adrenals/Urinary Tract: Normal adrenal glands. Probable cyst in the left kidney interpolar region without hydronephrosis. Normal appearance of the right kidney without hydronephrosis. Moderate distention of the urinary bladder without gross abnormality. Stomach/Bowel: Again noted is pneumatosis involving the transverse colon and a segment of the descending colon. There is no significant colonic wall thickening. No evidence for small bowel dilatation or obstruction. Cecum is located in the right upper abdomen and difficult to exclude some pneumatosis involving the cecum. Lymphatic: No significant lymphadenopathy in the abdomen or pelvis. Reproductive: Prostate is unremarkable. Other: No evidence for abdominal or pelvic ascites. There is no free intraperitoneal air. No evidence for portal gas. 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 There is a large lucency involving the L4 vertebral body which may be related to a Schmorl's node. There is also large low-density structure in the posterior aspect of the L3 vertebral body which could be related to a large Schmorl's node. IMPRESSION: VASCULAR Limited evaluation of the aorta and visceral arteries due to poor contrast opacification and technical issues with this examination. No gross abnormality  to the aorta such as an aneurysm or dissection. Main visceral arteries appear to be patent without significant stenosis. NON-VASCULAR Stable colonic pneumatosis. The pneumatosis is involving the transverse colon and a segment of the descending colon. No significant change from the previous examination and etiology is unknown. No focal bowel wall thickening. No free intraperitoneal  air. Again noted are lucent lesions within the L3 and L4 vertebral bodies. These could be related to very large Schmorl's nodes but indeterminate. Electronically Signed: By: Markus Daft M.D. On: 02/18/2016 12:22   US Thoracentesis Asp Pleural Space W/img Guide  Result Date: 02/29/2016 INDICATION: RIGHT pleural effusion EXAM: ULTRASOUND GUIDED RIGHT THORACENTESIS MEDICATIONS: None. COMPLICATIONS: None immediate. PROCEDURE: Procedure, benefits, and risks of procedure were discussed with patient. Written informed consent for procedure was obtained. Time out protocol followed. Pleural effusion localized by ultrasound at the posterior RIGHT hemithorax. Skin prepped and draped in usual sterile fashion. Skin and soft tissues anesthetized with 10 mL of 1% lidocaine. 8 French thoracentesis catheter placed into the RIGHT pleural space. 800 mL of dark old bloody RIGHT pleural aspirated by syringe pump. Procedure tolerated well by patient without immediate complication. FINDINGS: A total of approximately 800 ml of RIGHT pleural fluid was removed. Samples were sent to the laboratory as requested by the clinical team. IMPRESSION: Successful ultrasound guided RIGHT thoracentesis yielding 800 mL of pleural fluid. Electronically Signed   By: Lavonia Dana M.D.   On: 02/29/2016 15:18    Time Spent in minutes  30   Jani Gravel M.D on 03/05/2016 at 8:37 AM  Between 7am to 7am- Pager - [phone]

## 2016-03-05 NOTE — Progress Notes (Signed)
CRITICAL VALUE ALERT  Critical value received:  Troponin 0.11  Date of notification:  03/05/16  Time of notification:  [id number]  Critical value read back:Yes.    Nurse who received alert:  Theola Sequin RN  MD notified (1st page):  Maudie Mercury MD  Time of first page:  [phone]  Responding MD:  Maudie Mercury MD   Time MD responded:  [phone]

## 2016-03-06 ENCOUNTER — Inpatient Hospital Stay (HOSPITAL_COMMUNITY): Payer: Medicare Other

## 2016-03-06 DIAGNOSIS — J869 Pyothorax without fistula: Secondary | ICD-10-CM

## 2016-03-06 DIAGNOSIS — I10 Essential (primary) hypertension: Secondary | ICD-10-CM

## 2016-03-06 LAB — ECHOCARDIOGRAM COMPLETE
Height: 71 in
WEIGHTICAEL: 4786.63 [oz_av]

## 2016-03-06 LAB — GLUCOSE, CAPILLARY
GLUCOSE-CAPILLARY: 154 mg/dL — AB (ref 65–99)
GLUCOSE-CAPILLARY: 152 mg/dL — AB (ref 65–99)
GLUCOSE-CAPILLARY: 184 mg/dL — AB (ref 65–99)
GLUCOSE-CAPILLARY: 147 mg/dL — AB (ref 65–99)

## 2016-03-06 LAB — COMPREHENSIVE METABOLIC PANEL
ALT: 15 U/L — ABNORMAL LOW (ref 17–63)
AST: 13 U/L — ABNORMAL LOW (ref 15–41)
Albumin: 1.9 g/dL — ABNORMAL LOW (ref 3.5–5.0)
Alkaline Phosphatase: 78 U/L (ref 38–126)
Anion gap: 4 — ABNORMAL LOW (ref 5–15)
BUN: 11 mg/dL (ref 6–20)
CHLORIDE: 100 mmol/L — AB (ref 101–111)
CO2: 30 mmol/L (ref 22–32)
Calcium: 7.7 mg/dL — ABNORMAL LOW (ref 8.9–10.3)
Creatinine, Ser: 0.55 mg/dL — ABNORMAL LOW (ref 0.61–1.24)
Glucose, Bld: 162 mg/dL — ABNORMAL HIGH (ref 65–99)
POTASSIUM: 4 mmol/L (ref 3.5–5.1)
Sodium: 134 mmol/L — ABNORMAL LOW (ref 135–145)
TOTAL PROTEIN: 5 g/dL — AB (ref 6.5–8.1)
Total Bilirubin: 0.8 mg/dL (ref 0.3–1.2)

## 2016-03-06 LAB — PREPARE RBC (CROSSMATCH)

## 2016-03-06 LAB — CBC
HCT: 27.7 % — ABNORMAL LOW (ref 39.0–52.0)
Hemoglobin: 8.7 g/dL — ABNORMAL LOW (ref 13.0–17.0)
MCH: 29.9 pg (ref 26.0–34.0)
MCHC: 31.4 g/dL (ref 30.0–36.0)
MCV: 95.2 fL (ref 78.0–100.0)
PLATELETS: 327 10*3/uL (ref 150–400)
RBC: 2.91 MIL/uL — ABNORMAL LOW (ref 4.22–5.81)
RDW: 16.3 % — AB (ref 11.5–15.5)
WBC: 11.3 10*3/uL — AB (ref 4.0–10.5)

## 2016-03-06 LAB — PROTIME-INR
INR: 1.17
Prothrombin Time: 15 seconds (ref 11.4–15.2)

## 2016-03-06 LAB — APTT: APTT: 33 s (ref 24–36)

## 2016-03-06 LAB — TROPONIN I: TROPONIN I: 0.12 ng/mL — AB (ref <0.03)

## 2016-03-06 LAB — ABO/RH: ABO/RH(D): O POS

## 2016-03-06 MED ORDER — VANCOMYCIN HCL IN DEXTROSE 1-5 GM/200ML-% IV SOLN: 1000.0000 mg | Freq: Two times a day (BID) | INTRAVENOUS | Status: DC

## 2016-03-06 MED ORDER — SODIUM CHLORIDE 0.9 % IV SOLN
Freq: Once | INTRAVENOUS | Status: AC
  Administered 2016-03-06: 16:00:00 via INTRAVENOUS

## 2016-03-06 MED ORDER — INSULIN GLARGINE 100 UNIT/ML ~~LOC~~ SOLN
20.0000 [IU] | Freq: Every day | SUBCUTANEOUS | Status: DC
  Administered 2016-03-08 – 2016-03-15 (×8): 20 [IU] via SUBCUTANEOUS
  Filled 2016-03-06 (×9): qty 0.2

## 2016-03-06 MED ORDER — VANCOMYCIN HCL 10 G IV SOLR
1250.0000 mg | Freq: Three times a day (TID) | INTRAVENOUS | Status: DC
  Filled 2016-03-06: qty 1250

## 2016-03-06 MED ORDER — VANCOMYCIN HCL 10 G IV SOLR
1250.0000 mg | Freq: Two times a day (BID) | INTRAVENOUS | Status: DC
  Filled 2016-03-06: qty 1250

## 2016-03-06 MED ORDER — LIDOCAINE HCL (PF) 2 % IJ SOLN
INTRAMUSCULAR | Status: AC
  Filled 2016-03-06: qty 10

## 2016-03-06 MED ORDER — VANCOMYCIN HCL 10 G IV SOLR
1250.0000 mg | Freq: Three times a day (TID) | INTRAVENOUS | Status: DC
  Administered 2016-03-06 – 2016-03-09 (×8): 1250 mg via INTRAVENOUS
  Filled 2016-03-06 (×11): qty 1250

## 2016-03-06 MED ORDER — PIPERACILLIN-TAZOBACTAM 3.375 G IVPB 30 MIN: 3.3750 g | Freq: Three times a day (TID) | INTRAVENOUS | Status: DC

## 2016-03-06 MED ORDER — DEXTROSE 5 % IV SOLN
2.0000 g | Freq: Two times a day (BID) | INTRAVENOUS | Status: DC
  Administered 2016-03-06 – 2016-03-08 (×5): 2 g via INTRAVENOUS
  Filled 2016-03-06 (×8): qty 2

## 2016-03-06 MED ORDER — ATORVASTATIN CALCIUM 10 MG PO TABS
10.0000 mg | ORAL_TABLET | Freq: Every day | ORAL | Status: DC
  Administered 2016-03-06: 10 mg via ORAL
  Filled 2016-03-06: qty 1

## 2016-03-06 MED ORDER — SODIUM CHLORIDE 0.9% FLUSH: 10.0000 mL | INTRAVENOUS | Status: DC | PRN

## 2016-03-06 MED ORDER — PERFLUTREN LIPID MICROSPHERE
1.0000 mL | INTRAVENOUS | Status: DC | PRN
  Administered 2016-03-06: 1 mL via INTRAVENOUS
  Filled 2016-03-06: qty 10

## 2016-03-06 MED ORDER — SODIUM CHLORIDE 0.9% FLUSH
10.0000 mL | Freq: Two times a day (BID) | INTRAVENOUS | Status: DC
  Administered 2016-03-06: 30 mL
  Administered 2016-03-06 – 2016-03-10 (×7): 10 mL
  Administered 2016-03-10: 20 mL
  Administered 2016-03-11: 10 mL

## 2016-03-06 MED ORDER — HEPARIN SODIUM (PORCINE) 5000 UNIT/ML IJ SOLN
5000.0000 [IU] | Freq: Three times a day (TID) | INTRAMUSCULAR | Status: DC
  Administered 2016-03-06 – 2016-03-07 (×3): 5000 [IU] via SUBCUTANEOUS
  Filled 2016-03-06 (×3): qty 1

## 2016-03-06 NOTE — Consult Note (Signed)
Name: Richard Fields MRN: [id number] DOB: 02-Dec-1940    ADMISSION DATE:  02/18/2016 CONSULTATION DATE:  03/06/16  REFERRING MD :  TRH  CHIEF COMPLAINT:  Dyspnea with pleural effusion  BRIEF PATIENT DESCRIPTION:  75 yo remote smoker with DM with severe peripheral neuropathy, hypertension and hyperlipidemia who was admitted to [facility] 02/18/2016 with RLL cavitary pneumonia. Patient is unreliable historian but family and friend deny known baseline lung disease. Thoracentesis 10/30- bloody fluid, culture neg. CT 110/30- large R effusion significantly compressing R lung. Plan is for bronch/ VATS or thoracotomy by TSGY 11/6.  SIGNIFICANT EVENTS    STUDIES:     HISTORY OF PRESENT ILLNESS: 75 yo remote smoker with DM with severe peripheral neuropathy, hypertension and hyperlipidemia who was admitted to [facility] 02/18/2016 with RLL cavitary pneumonia. Patient is unreliable historian but family and friend deny known baseline lung disease. Thoracentesis 10/30- bloody fluid, culture neg. CT 110/30- large R effusion significantly compressing R lung. Plan is for bronch/ VATS or thoracotomy by TSGY 11/6. Wife and friend with minister available for furter detail. They deny known lung disease or significant hs of reflux/ choking to suggest aspiration.He has been weaker, less mobile and sometimes confused. Patient answered "no" to all questions asked and is not considered a reliable historian.  PAST MEDICAL HISTORY :   has a past medical history of Cervical disc disease; Chronic constipation; Diabetes mellitus without complication ([place]); Hyperlipidemia; Hypertension; and Peripheral neuropathy ([place]).  has a past surgical history that includes Tonsillectomy; Hernia repair; Colonoscopy (2003); and Colonoscopy (05/30/2012).   Prior to Admission medications   Medication Sig Start Date End Date Taking? Authorizing Provider  AMITIZA 24 MCG capsule Take 2 capsules by mouth daily. 08/14/13  Yes  Historical Provider, MD  Aspirin-Salicylamide-Caffeine (BC HEADACHE POWDER PO) Take 1 Package by mouth daily as needed (pain).   Yes Historical Provider, MD  atorvastatin (LIPITOR) 10 MG tablet Take 10 mg by mouth daily.  05/10/12  Yes Historical Provider, MD  benzonatate (TESSALON) 200 MG capsule Take 200 mg by mouth 3 (three) times daily as needed for cough.   Yes Historical Provider, MD  furosemide (LASIX) 40 MG tablet Take 1 tablet (40 mg total) by mouth 2 (two) times daily. Patient taking differently: Take 40 mg by mouth daily.  10/12/15  Yes Davonna Belling, MD  HYDROcodone-acetaminophen (NORCO) 7.5-325 MG tablet Take 1 tablet by mouth every 6 (six) hours as needed for moderate pain (Must last 30 days.Do not drive or operate machinery while taking this medicine.). 01/14/16  Yes Sanjuana Kava, MD  LORazepam (ATIVAN) 2 MG tablet Take 2 mg by mouth at bedtime.   Yes Historical Provider, MD  losartan (COZAAR) 50 MG tablet Take 50 mg by mouth daily.   Yes Historical Provider, MD  metFORMIN (GLUCOPHAGE) 500 MG tablet Take 1 tablet by mouth 2 (two) times daily. 02/08/16  Yes Historical Provider, MD  mirtazapine (REMERON) 15 MG tablet Take 15 mg by mouth at bedtime.  08/18/14  Yes Historical Provider, MD  potassium chloride SA (K-DUR,KLOR-CON) 20 MEQ tablet Take 20 mEq by mouth daily.  08/26/13  Yes Historical Provider, MD  pregabalin (LYRICA) 150 MG capsule Take 150 mg by mouth 2 (two) times daily.   Yes Historical Provider, MD  traZODone (DESYREL) 50 MG tablet Take 2 tablets by mouth at bedtime.  07/28/14  Yes Historical Provider, MD   Allergies  Allergen Reactions  . Penicillins Other (See Comments)    Unknown-reaction is unknown  .  Tegretol [Carbamazepine] Itching    FAMILY HISTORY:  family history includes Cancer in his father; Colon cancer in his paternal uncle; Diabetes in his brother. SOCIAL HISTORY:  reports that he has been smoking.  He has smoked for the past 40.00 years. He has quit  using smokeless tobacco. His smokeless tobacco use included Chew. He reports that he does not drink alcohol or use drugs.  REVIEW OF SYSTEMS:  + = pos Constitutional: Negative for fever, chills, weight loss, malaise/fatigue and diaphoresis.  HENT: Negative for hearing loss, ear pain, nosebleeds, congestion, sore throat, neck pain, tinnitus and ear discharge.   Eyes: Negative for blurred vision, double vision, photophobia, pain, discharge and redness.  Respiratory: + cough, +hemoptysis, sputum production, +shortness of breath, wheezing and stridor.   Cardiovascular: Negative for chest pain, palpitations, orthopnea, claudication, leg swelling and PND.  Gastrointestinal: Negative for heartburn, nausea, vomiting, abdominal pain, diarrhea, constipation, blood in stool and melena.  Genitourinary: Negative for dysuria, urgency, frequency, hematuria and flank pain.  Musculoskeletal: Negative for myalgias, back pain, joint pain and falls.  Skin: Negative for itching and rash.  Neurological: Negative for dizziness, tingling, tremors, +sensory change, speech change, focal weakness, seizures, loss of consciousness, weakness and headaches.  Endo/Heme/Allergies: Negative for environmental allergies and polydipsia. Does not bruise/bleed easily.  SUBJECTIVE:   VITAL SIGNS: Temp:  [97.2 F (36.2 C)-99 F (37.2 C)] 98.5 F (36.9 C) (11/05 1636) Pulse Rate:  [73-108] 85 (11/05 1636) Resp:  [17-27] 24 (11/05 1636) BP: (65-115)/(19-75) 98/54 (11/05 1636) SpO2:  [94 %-98 %] 95 % (11/05 1636) Weight:  [135.7 kg (299 lb 2.6 oz)] 135.7 kg (299 lb 2.6 oz) (11/05 0500)  PHYSICAL EXAMINATION: General:  Obese male, lying with eyes closed but verbally responsive, NAD. Nurse in room Neuro:  Moves extremities, not focal HEENT:  No stridor, speech clear Cardiovascular:  RRR, no m/g/r Lungs: dull and quiet R chest, no rub. No cough or wheeze, unlabored on O2 Abdomen:  Obese, non-tender, BS+ Musculoskeletal:  Normal  muscle bulk Skin:  Small bruise R forearm   Recent Labs Lab 03/04/16 0408 03/05/16 1101 03/06/16 0456  NA 136 132* 134*  K 4.0 4.4 4.0  CL 101 97* 100*  CO2 31 29 30   BUN 10 13 11   CREATININE 0.50* 0.78 0.55*  GLUCOSE 120* 250* 162*    Recent Labs Lab 03/04/16 0408 03/05/16 1101 03/06/16 0456  HGB 8.9* 9.9* 8.7*  HCT 28.0* 31.4* 27.7*  WBC 12.4* 14.3* 11.3*  PLT 321 350 327   Dg Chest 1 View  Result Date: 03/05/2016 CLINICAL DATA:  Tachycardia. EXAM: CHEST 1 VIEW COMPARISON:  02/29/2016 and CT from 02/29/2016 FINDINGS: Again noted is a large right pleural effusion with compressive atelectasis. Only a small portion of the right lung is aerated. Right pleural fluid has increased since the prior chest radiograph. Left lung remains clear. Cardiac silhouette is grossly stable in size but partially obscured by the right chest densities. Negative for pneumothorax. Patient is slightly rotated towards the right. IMPRESSION: Large right pleural effusion with compressive atelectasis. Only a small portion of the right lung is aerated. The right pleural fluid has slightly enlarged since 02/29/2016. Electronically Signed   By: Markus Daft M.D.   On: 03/05/2016 11:28   Ct Angio Chest Pe W Or Wo Contrast  Result Date: 03/05/2016 CLINICAL DATA:  Elevated D-dimer with shortness of breath. EXAM: CT ANGIOGRAPHY CHEST WITH CONTRAST TECHNIQUE: Multidetector CT imaging of the chest was performed using the standard protocol  during bolus administration of intravenous contrast. Multiplanar CT image reconstructions and MIPs were obtained to evaluate the vascular anatomy. CONTRAST:  125 mL Isovue 370 COMPARISON:  Chest CT 02/29/2016 FINDINGS: Cardiovascular: No evidence for a pulmonary embolism. Normal caliber of the thoracic aorta and no evidence for aortic dissection. There is flow in the great vessels. Coronary artery calcifications. Mediastinum/Nodes: There is no significant chest lymphadenopathy. However,  there is low-density material along the right anterior mediastinum. This was present on the previous examination but not present on the exam from 02/18/2016. This may represent loculated fluid.Esophagus is unremarkable. No significant axillary lymphadenopathy. Lungs/Pleura: There is a massive right pleural effusion occupying most of the right chest. There is complete collapse of the right middle lobe and right lower lobe. Small portion of the right upper lobe is still aerated. Left lung is clear. There is mild atelectasis at the left lung base. Upper Abdomen: No acute abnormality. Musculoskeletal: No acute bone abnormality. Review of the MIP images confirms the above findings. IMPRESSION: No evidence for pulmonary embolism. No evidence for aortic dissection. Large right pleural effusion causing collapse of the right middle lobe and right lower lobe. Only a small portion of the right upper lobe is aerated. In addition, there appears to be loculated fluid extending into the right side of the mediastinum. Electronically Signed   By: Markus Daft M.D.   On: 03/05/2016 14:42   CT images reviewed- agree large R effusion. Can't r/o underlying lesion  ASSESSMENT / PLAN:  Large R pleural effusion- hydro/hemothorax. - reportedly began as a cavitary pneumonia. Can't exclude aspiration pneumonia or cavitary Ca. Agree with Dr Vivi Martens assessment and plan. PCCM will be available if needed.   CD Annamaria Boots, MD [facility] [place] Pager: [phone]  03/06/2016, 4:42 PM

## 2016-03-06 NOTE — Progress Notes (Addendum)
Patient ID: Richard Fields, male   DOB: 28-Jan-1941, 75 y.o.   MRN: [id number]                                                               PROGRESS NOTE                                                                                                                                                                                                             Patient Demographics:    Richard Fields, is a 75 y.o. male, DOB - May 24, 1940, [id number]  Admit date - 02/18/2016   Admitting Physician Erline Hau, MD  Outpatient Primary MD for the patient is Asencion Noble, MD  LOS - [phone]  Outpatient Specialists:  Sinda Du  Chief Complaint  Patient presents with  . Hematemesis       Brief Narrative  75 y.o.malewith a past medical history significant for DM, HLD, HTN, and peripheral neuropathy, who presents by ambulance with complaints of blackish/brownish hemoptysis that onset 1 monthago. Very poor historian. H/o mainly obtained by caregiver and wife at bedside. Per wife he hasassociated right shoulder pain and profusediaphoresis overnight. He denies any fever or chills. While in the ED, he was started on vancomycin and admitted for further evaluation of cavitary pneumonia.   ED Course:Glucose 292, Troponin 0.11, WBC 19.9, D-dimer 0.92, CXR shows right lung base atelectatic versus infiltrate. CT Chest: RLL ATX surrounding a non-enhancing cavitary lesion, likely a cavitary PNA. CTA Abdomen: pneumatosis of the transverse colon and a segment of the descending colon. No free intraperitoneal air.   Subjective:    Richard Fields today states that his breathing is ok.  Pt denies fever, chills, cp, palp, sob, n/v, diarrhea, brbpr. He responds to his name but appears tired.     Pt yesterday was slightly tachycardic, and hypotensive yesterday which responded to 250 mL normal saline bolus.  Pt appeared to have slight increase in work of breathing.  CXR 10/5 showed large pleural  effusion , CTA chest negative for PE,  Some loculated fluid.  Pulmonary consulted and recommended transfer to [facility].  Dr. Luan Pulling has already arranged for patient to be accepted by hospitalist service and recommended that CT surgery see the patient as well.    Assessment  & Plan :  Principal Problem:   Cavitary pneumonia Active Problems:   Hemoptysis   Morbid obesity ([place])   Pneumatosis intestinalis   Pressure injury of skin   Type II diabetes mellitus, uncontrolled (HCC)   Sepsis (HCC)   Elevated troponin level   Acute respiratory failure with hypoxia (HCC)   Encephalopathy, metabolic   Acute upper GI bleeding   Acute blood loss anemia   Protein-calorie malnutrition (HCC)   Abdominal pain, right lateral   Aspirin long-term use   Hypoxia   Pleural effusion on right   S/P thoracentesis   SOB (shortness of breath)   1. Recurrent Large Right pleural effusion  S/p thoracentesis on 10/30 Pulmonary has been following  [facility] per [facility] Luan Pulling.  He has d/w hospitalist service there.  Pt will need CT surgery evaluation Dr. Cyndia Bent has been notified by Dr. Luan Pulling, appreciate his input.   2.  Cavitary pneumonia/ sepsis Cont levaquin  3. Tachycardia, + trop (pt had + trop on admission) Probably demand ischemia Heart rate improved, bp improved w ns 288mL bolus yesterday.  Echo pending Current sbp 127  4. Anemia hemodynamically stable Cbc in am Appreciate GI input.   5. UGI bleeding s/p 2 units prbc Cont protonix GI had been following at [facility] prior to discharge once more stable  5. Dm2 Cont lantus, and ssi  6. Copd Decrease prednisone to 15mg  po qday  7. Deconditioning PT  8 Severe Protein calorie malnutrition prostat  9. Tinea cruris Nystatin      Code Status : FULL CODE  Family Communication  : d/w wife this am about his transfer to [facility]  Disposition Plan  :  SNF   Barriers For Discharge :   Consults  :   Pulmonary, GI,  CT surgery  Procedures  : thoracentesis 10/30  DVT Prophylaxis  :    Lab Results  Component Value Date   PLT 327 03/06/2016    Antibiotics  :   vanco 10/19=> 10/31 primaxin 10/19 levaquin 10/19=>   Anti-infectives    Start     Dose/Rate Route Frequency Ordered Stop   03/01/16 1000  levofloxacin (LEVAQUIN) tablet 500 mg     500 mg Oral Daily 03/01/16 0908     02/21/16 1600  vancomycin (VANCOCIN) 1,250 mg in sodium chloride 0.9 % 250 mL IVPB  Status:  Discontinued     1,250 mg 166.7 mL/hr over 90 Minutes Intravenous Every 8 hours 02/21/16 0855 02/21/16 0857   02/21/16 0900  vancomycin (VANCOCIN) 1,250 mg in sodium chloride 0.9 % 250 mL IVPB  Status:  Discontinued     1,250 mg 166.7 mL/hr over 90 Minutes Intravenous Every 8 hours 02/21/16 0857 03/01/16 0907   02/19/16 1600  vancomycin (VANCOCIN) IVPB 1000 mg/200 mL premix  Status:  Discontinued     1,000 mg 200 mL/hr over 60 Minutes Intravenous Every 8 hours 02/19/16 0758 02/21/16 0855   02/19/16 0800  vancomycin (VANCOCIN) 1,500 mg in sodium chloride 0.9 % 500 mL IVPB     1,500 mg 250 mL/hr over 120 Minutes Intravenous  Once 02/19/16 0758 02/19/16 1127   02/18/16 1700  vancomycin (VANCOCIN) IVPB 1000 mg/200 mL premix  Status:  Discontinued     1,000 mg 200 mL/hr over 60 Minutes Intravenous Every 8 hours 02/18/16 1129 02/18/16 1328   02/18/16 1600  imipenem-cilastatin (PRIMAXIN) 500 mg in sodium chloride 0.9 % 100 mL IVPB  Status:  Discontinued     500 mg 200  mL/hr over 30 Minutes Intravenous Every 6 hours 02/18/16 1358 03/01/16 0907   02/18/16 0900  vancomycin (VANCOCIN) 1,500 mg in sodium chloride 0.9 % 500 mL IVPB     1,500 mg 250 mL/hr over 120 Minutes Intravenous  Once 02/18/16 0816 02/18/16 1250   02/18/16 0700  levofloxacin (LEVAQUIN) IVPB 750 mg     750 mg 100 mL/hr over 90 Minutes Intravenous  Once 02/18/16 0651 02/18/16 0957        Objective:   Vitals:   03/06/16 0400 03/06/16 0500 03/06/16  0600 03/06/16 0808  BP:      Pulse: 78 75 98   Resp: 17 18 20    Temp:  98.5 F (36.9 C)    TempSrc:  Oral    SpO2: 96% 96% 96% 97%  Weight:  135.7 kg (299 lb 2.6 oz)    Height:        Wt Readings from Last 3 Encounters:  03/06/16 135.7 kg (299 lb 2.6 oz)  12/02/15 129.3 kg (285 lb)  10/12/15 127 kg (280 lb)     Intake/Output Summary (Last 24 hours) at 03/06/16 0838 Last data filed at 03/06/16 0558  Gross per 24 hour  Intake                0 ml  Output             1650 ml  Net            -1650 ml     Physical Exam  Awake Alert, Oriented X 3, No new F.N deficits, Normal affect [place].AT,PERRAL Supple Neck,No JVD, No cervical lymphadenopathy appriciated.  Symmetrical Chest wall movement, Good air movement bilaterally, decrease in bs about 1/2 up right side, no wheeze, no crackle.  RRR,No Gallops,Rubs or new Murmurs, No Parasternal Heave +ve B.Sounds, Abd Soft, No tenderness, No organomegaly appriciated, No rebound - guarding or rigidity. No Cyanosis, Clubbing or edema Slight redness in the bilateral groin folds   Data Review:    CBC  Recent Labs Lab 02/29/16 0400  03/01/16 0357 03/02/16 0357 03/03/16 0355 03/04/16 0408 03/05/16 1101 03/06/16 0456  WBC 15.3*  --  13.8* 12.6* 14.4* 12.4* 14.3* 11.3*  HGB 8.5*  < > 8.1* 8.0* 9.1* 8.9* 9.9* 8.7*  HCT 25.5*  < > 25.2* 24.9* 28.2* 28.0* 31.4* 27.7*  PLT 241  --  240 251 295 321 350 327  MCV 90.4  --  92.3 92.9 93.4 94.6 94.3 95.2  MCH 30.1  --  29.7 29.9 30.1 30.1 29.7 29.9  MCHC 33.3  --  32.1 32.1 32.3 31.8 31.5 31.4  RDW 15.7*  --  15.8* 15.9* 16.3* 16.6* 16.3* 16.3*  LYMPHSABS 0.8  --  0.9 1.8 1.9  --   --   --   MONOABS 0.8  --  0.7 1.0 1.2*  --   --   --   EOSABS 0.0  --  0.0 0.0 0.1  --   --   --   BASOSABS 0.0  --  0.0 0.0 0.0  --   --   --   < > = values in this interval not displayed.  Chemistries   Recent Labs Lab 03/02/16 0357 03/03/16 0355 03/04/16 0408 03/05/16 1101 03/06/16 0456  NA 138  135 136 132* 134*  K 3.2* 3.3* 4.0 4.4 4.0  CL 97* 95* 101 97* 100*  CO2 36* 35* 31 29 30   GLUCOSE 165* 153* 120* 250* 162*  BUN 11 11  10 13 11   CREATININE 0.51* 0.55* 0.50* 0.78 0.55*  CALCIUM 7.5* 7.6* 7.6* 7.8* 7.7*  AST  --   --   --  16 13*  ALT  --   --   --  19 15*  ALKPHOS  --   --   --  95 78  BILITOT  --   --   --  1.2 0.8   ------------------------------------------------------------------------------------------------------------------ No results for input(s): CHOL, HDL, LDLCALC, TRIG, CHOLHDL, LDLDIRECT in the last 72 hours.  Lab Results  Component Value Date   HGBA1C 8.7 (H) 02/18/2016   ------------------------------------------------------------------------------------------------------------------  Recent Labs  03/05/16 1101  TSH 0.673   ------------------------------------------------------------------------------------------------------------------ No results for input(s): VITAMINB12, FOLATE, FERRITIN, TIBC, IRON, RETICCTPCT in the last 72 hours.  Coagulation profile No results for input(s): INR, PROTIME in the last 168 hours.   Recent Labs  03/05/16 1101  DDIMER 2.35*    Cardiac Enzymes  Recent Labs Lab 03/05/16 1101 03/05/16 1536 03/05/16 2250  TROPONINI 0.13* 0.11* 0.12*   ------------------------------------------------------------------------------------------------------------------    Component Value Date/Time   BNP 50.0 02/18/2016 0517    Inpatient Medications  Scheduled Meds: . sodium chloride   Intravenous Once  . albuterol  2.5 mg Nebulization TID  . insulin aspart  0-20 Units Subcutaneous TID AC & HS  . insulin glargine  40 Units Subcutaneous Daily  . levofloxacin  500 mg Oral Daily  . LORazepam  1 mg Oral BID  . losartan  50 mg Oral Daily  . mirtazapine  15 mg Oral QHS  . nystatin cream   Topical TID  . pantoprazole  40 mg Oral BID AC  . predniSONE  15 mg Oral Q breakfast  . pregabalin  150 mg Oral BID  . sodium  chloride flush  3 mL Intravenous Q12H  . traZODone  100 mg Oral QHS   Continuous Infusions: . sodium chloride 10 mL/hr at 03/04/16 0849   PRN Meds:.albuterol, alum & mag hydroxide-simeth, HYDROcodone-acetaminophen, morphine injection, ondansetron **OR** ondansetron (ZOFRAN) IV, senna-docusate  Micro Results Recent Results (from the past 240 hour(s))  Gram stain     Status: None   Collection Time: 02/29/16  2:44 PM  Result Value Ref Range Status   Specimen Description FLUID RIGHT PLEURAL COLLECTED BY DOCTOR  Final   Special Requests NONE  Final   Gram Stain   Final    MODERATE WBC PRESENT,BOTH PMN AND MONONUCLEAR NO ORGANISMS SEEN Performed at [facility]    Report Status 02/29/2016 FINAL  Final  Culture, body fluid-bottle     Status: None   Collection Time: 02/29/16  2:44 PM  Result Value Ref Range Status   Specimen Description FLUID RIGHT PLEURAL COLLECTED BY DOCTOR  Final   Special Requests BOTTLES DRAWN AEROBIC AND ANAEROBIC 10CC  Final   Culture NO GROWTH 5 DAYS  Final   Report Status 03/05/2016 FINAL  Final    Radiology Reports Dg Chest 1 View  Result Date: 03/05/2016 CLINICAL DATA:  Tachycardia. EXAM: CHEST 1 VIEW COMPARISON:  02/29/2016 and CT from 02/29/2016 FINDINGS: Again noted is a large right pleural effusion with compressive atelectasis. Only a small portion of the right lung is aerated. Right pleural fluid has increased since the prior chest radiograph. Left lung remains clear. Cardiac silhouette is grossly stable in size but partially obscured by the right chest densities. Negative for pneumothorax. Patient is slightly rotated towards the right. IMPRESSION: Large right pleural effusion with compressive atelectasis. Only a small portion of the  right lung is aerated. The right pleural fluid has slightly enlarged since 02/29/2016. Electronically Signed   By: Markus Daft M.D.   On: 03/05/2016 11:28   Dg Chest 1 View  Result Date: 02/19/2016 CLINICAL DATA:   Shortness of breath for several weeks EXAM: CHEST 1 VIEW COMPARISON:  02/18/2016 FINDINGS: Cardiac shadow is enlarged. Right-sided pleural effusion is increasing from the prior exam. No focal infiltrate is seen in the left lung. No bony abnormality is noted. IMPRESSION: Increasing right-sided pleural effusion. Electronically Signed   By: Inez Catalina M.D.   On: 02/19/2016 09:28   Ct Chest W Contrast  Result Date: 02/29/2016 CLINICAL DATA:  Pneumonia, intubated EXAM: CT CHEST WITH CONTRAST TECHNIQUE: Multidetector CT imaging of the chest was performed during intravenous contrast administration. CONTRAST:  4mL ISOVUE-300 IOPAMIDOL (ISOVUE-300) INJECTION 61% COMPARISON:  02/17/2016 FINDINGS: Cardiovascular: No significant vascular findings. Normal heart size. No pericardial effusion. Coronary artery atherosclerosis in the LAD and circumflex. Mediastinum/Nodes: No enlarged mediastinal, hilar, or axillary lymph nodes. Thyroid gland, trachea, and esophagus demonstrate no significant findings. Lungs/Pleura: Large right pleural effusion is severe compressive atelectasis with only a small area of lung at the apex. No left pleural effusion. No pneumothorax. Upper Abdomen: No acute upper abdominal abnormality. Musculoskeletal: No acute osseous abnormality. No lytic or sclerotic osseous lesion. IMPRESSION: 1. Large right pleural effusion with severe compressive atelectasis with only a small area of aerated lung at the apex. 2. Coronary artery atherosclerosis. Electronically Signed   By: Kathreen Devoid   On: 02/29/2016 13:12   Ct Angio Chest Pe W Or Wo Contrast  Result Date: 03/05/2016 CLINICAL DATA:  Elevated D-dimer with shortness of breath. EXAM: CT ANGIOGRAPHY CHEST WITH CONTRAST TECHNIQUE: Multidetector CT imaging of the chest was performed using the standard protocol during bolus administration of intravenous contrast. Multiplanar CT image reconstructions and MIPs were obtained to evaluate the vascular anatomy.  CONTRAST:  125 mL Isovue 370 COMPARISON:  Chest CT 02/29/2016 FINDINGS: Cardiovascular: No evidence for a pulmonary embolism. Normal caliber of the thoracic aorta and no evidence for aortic dissection. There is flow in the great vessels. Coronary artery calcifications. Mediastinum/Nodes: There is no significant chest lymphadenopathy. However, there is low-density material along the right anterior mediastinum. This was present on the previous examination but not present on the exam from 02/18/2016. This may represent loculated fluid.Esophagus is unremarkable. No significant axillary lymphadenopathy. Lungs/Pleura: There is a massive right pleural effusion occupying most of the right chest. There is complete collapse of the right middle lobe and right lower lobe. Small portion of the right upper lobe is still aerated. Left lung is clear. There is mild atelectasis at the left lung base. Upper Abdomen: No acute abnormality. Musculoskeletal: No acute bone abnormality. Review of the MIP images confirms the above findings. IMPRESSION: No evidence for pulmonary embolism. No evidence for aortic dissection. Large right pleural effusion causing collapse of the right middle lobe and right lower lobe. Only a small portion of the right upper lobe is aerated. In addition, there appears to be loculated fluid extending into the right side of the mediastinum. Electronically Signed   By: Markus Daft M.D.   On: 03/05/2016 14:42   Ct Angio Chest Pe W/cm &/or Wo Cm  Result Date: 02/18/2016 CLINICAL DATA:  Hemoptysis for 2-3 days.  Back pain. EXAM: CT ANGIOGRAPHY CHEST WITH CONTRAST TECHNIQUE: Multidetector CT imaging of the chest was performed using the standard protocol during bolus administration of intravenous contrast. Multiplanar CT image reconstructions and  MIPs were obtained to evaluate the vascular anatomy. CONTRAST:  100 cc Isovue 370 intravenous COMPARISON:  None. FINDINGS: Cardiovascular: CTA of the pulmonary arteries is  limited by motion and suboptimal opacification. Apparent filling defect in a subsegmental left upper lobe branch, 6:121, is an area of motion and favored artifactual. The low-density is also not at a branch point. No filling defect is identified. No cardiomegaly or pericardial effusion. No acute aortic finding. Diffuse atherosclerotic calcification of the aorta and coronaries. Mediastinum/Nodes: Prominent right paratracheal lymph node of 11 mm short axis, incidental in isolation. Lungs/Pleura: There is atelectasis at the right base, multi segment, with a ~ 3 cm nonenhancing area in the lower lobe that has central lucency. There is small right pleural effusion that is small, with mild posterior chest loculation. No edema. Upper Abdomen: Hepatic steatosis Musculoskeletal: Degenerative changes without acute or aggressive finding. Review of the MIP images confirms the above findings. IMPRESSION: 1. Limited CTA with no definitive pulmonary embolism. There is an apparent filling defect within a subsegmental left upper lobe branch, but favored artifactual. Consider lower extremity Dopplers. 2. Right lower lobe atelectasis surrounding a nonenhancing cavitary focus, likely a cavitary pneumonia. Right pleural effusion is small but has areas of early loculation. 3. After completed treatment, recommend CT follow-up. Electronically Signed   By: Monte Fantasia M.D.   On: 02/18/2016 07:44   [place] Chest  Result Date: 02/19/2016 CLINICAL DATA:  Abnormal chest radiograph, RIGHT pleural effusion EXAM: CHEST ULTRASOUND COMPARISON:  Chest radiograph 02/19/2016 FINDINGS: Only a minimal amount of RIGHT pleural fluid is identified. Consolidated lower RIGHT lung is seen. Volume of fluid visualized is insufficient for thoracentesis. IMPRESSION: RIGHT lower lobe consolidation. Minimal RIGHT pleural effusion. Discussed with Dr. Luan Pulling. Electronically Signed   By: Lavonia Dana M.D.   On: 02/19/2016 14:07   Dg Chest Port 1 View  Result  Date: 02/29/2016 CLINICAL DATA:  RIGHT pleural effusion post thoracentesis EXAM: PORTABLE CHEST 1 VIEW COMPARISON:  Portable exam 1521 hours compared to 02/27/2016 chest radiograph and CT chest of 02/29/2016 FINDINGS: Enlargement of cardiac silhouette with vascular congestion. Persistent moderate-sized RIGHT pleural effusion despite thoracentesis. No pneumothorax. Compressive atelectasis of RIGHT middle and RIGHT lower lobes. LEFT lung grossly clear. Osseous structures unremarkable. IMPRESSION: No pneumothorax following RIGHT thoracentesis. Persistent RIGHT pleural effusion with significant atelectasis of the lower RIGHT lung. Enlargement of cardiac silhouette with pulmonary vascular congestion. Electronically Signed   By: Lavonia Dana M.D.   On: 02/29/2016 15:38   Dg Chest Port 1 View  Result Date: 02/27/2016 CLINICAL DATA:  Pneumonia. EXAM: PORTABLE CHEST 1 VIEW COMPARISON:  02/26/2016. FINDINGS: Stable enlarged cardiac silhouette no significant change in a moderate to large-sized right pleural effusion. The left lung remains clear. The left PICC tip remains in the region of the origin of the superior vena cava. Unremarkable bones. IMPRESSION: 1. Stable moderate to large-sized right pleural effusion and cardiomegaly. 2. Left PICC tip at the origin of the superior vena cava. Electronically Signed   By: Claudie Revering M.D.   On: 02/27/2016 10:17   Dg Chest Port 1 View  Result Date: 02/26/2016 CLINICAL DATA:  Pt couldn't verbally explain reason for exam. Tech and nurse techs assisted to try and stable pt upright to get sufficient AP view of chest. Reason for exam is pneumonia. EXAM: PORTABLE CHEST 1 VIEW COMPARISON:  02/22/2016 FINDINGS: Opacity at the right lung base has increased consistent with enlargement of the right pleural effusion, now moderate to large in size. No  convincing left pleural effusion. No pneumothorax. No evidence of pulmonary edema.  No definite pneumonia. Cardiac silhouette is enlarged  but stable. Left PICC has its tip at the left brachiocephalic vein superior vena cava confluence. IMPRESSION: 1. Increased right pleural effusion, now moderate to large. 2. Convincing pneumonia.  No pulmonary edema. Electronically Signed   By: Lajean Manes M.D.   On: 02/26/2016 10:04   Dg Chest Port 1 View  Result Date: 02/22/2016 CLINICAL DATA:  Respiratory failure. EXAM: PORTABLE CHEST 1 VIEW COMPARISON:  Radiographs of February 21, 2016. FINDINGS: Stable cardiomediastinal silhouette. Endotracheal and nasogastric tubes are in grossly good position. No pneumothorax is noted. Stable moderate right pleural effusion is noted with probable underlying atelectasis or infiltrate. Stable central pulmonary vascular congestion. Stable position of left subclavian catheter with distal tip in expected position of the SVC. Bony thorax is unremarkable. Stable minimal left basilar subsegmental atelectasis. IMPRESSION: Stable minimal left basilar subsegmental atelectasis. Stable moderate right pleural effusion with probable underlying atelectasis or infiltrate. Support apparatus in grossly good position. Electronically Signed   By: Marijo Conception, M.D.   On: 02/22/2016 10:09   Dg Chest Port 1 View  Result Date: 02/21/2016 CLINICAL DATA:  Respiratory failure EXAM: PORTABLE CHEST 1 VIEW COMPARISON:  02/20/2016 FINDINGS: Cardiomegaly again noted. Stable NG tube and endotracheal tube position. Central vascular congestion. Persistent right pleural effusion with atelectasis or infiltrate in right lower lobe. Small left pleural effusion with left basilar atelectasis. No convincing pulmonary edema. Left arm PICC line is unchanged in position. IMPRESSION: Stable NG tube and endotracheal tube position. Central vascular congestion. Persistent right pleural effusion with atelectasis or infiltrate in right lower lobe. Small left pleural effusion with left basilar atelectasis. No convincing pulmonary edema. Electronically Signed   By:  Lahoma Crocker M.D.   On: 02/21/2016 09:33   Dg Chest Port 1 View  Result Date: 02/20/2016 CLINICAL DATA:  Respiratory failure.  Evaluate endotracheal tube. EXAM: PORTABLE CHEST 1 VIEW COMPARISON:  02/19/2016. FINDINGS: Endotracheal tube is 5.6 cm above the carina and appropriately positioned. A nasogastric tube extends into the abdomen. Lower chest is not completely visualized. Patient rotated towards the right. Left lung appears clear. There continues to be right pleural fluid which is probably loculated. Persistent densities at the right chest base could represent atelectasis. Heart size is grossly stable. IMPRESSION: Endotracheal tube is appropriately positioned above the carina. Persistent pleural-parenchymal disease in the right lung with volume loss. Electronically Signed   By: Markus Daft M.D.   On: 02/20/2016 11:11   Dg Chest Port 1 View  Result Date: 02/18/2016 CLINICAL DATA:  75 year old male with shortness of breath and hemoptysis. EXAM: PORTABLE CHEST 1 VIEW COMPARISON:  Chest radiograph dated 08/20/2014 FINDINGS: There is shallow inspiration. Mild diffuse interstitial prominence and crowding as well as mild bronchiectatic changes. Right lung base hazy density likely atelectatic changes. Developing infiltrate is not excluded. Clinical correlation is recommended. There is no pleural effusion or pneumothorax. Stable mild cardiomegaly. No acute osseous pathology. IMPRESSION: Right lung base atelectatic changes versus less likely infiltrate. Clinical correlation is recommended. Electronically Signed   By: Anner Crete M.D.   On: 02/18/2016 06:23   Dg Chest Port 1v Same Day  Result Date: 02/20/2016 CLINICAL DATA:  Status post PICC line placement. EXAM: PORTABLE CHEST 1 VIEW COMPARISON:  02/20/2016 at 1050 hours FINDINGS: Endotracheal tube is 5 cm above the carina. Nasogastric tube extends to the abdomen but the tip is beyond the image. Again noted is  right pleural fluid extending up the lateral  aspect of the right chest. There continues to be consolidation and volume loss in the right lower lung. Stable enlargement of the cardiac silhouette. A left arm PICC line has been placed. Catheter tip has a horizontal orientation and probably at the junction of the upper SVC and left innominate vein. IMPRESSION: PICC line tip near the junction of the left innominate vein and SVC. Persistent pleural and parenchymal disease in the right chest. Minimal change from the recent comparison examination. Electronically Signed   By: Markus Daft M.D.   On: 02/20/2016 12:55   Dg Shoulder Right Portable  Result Date: 02/18/2016 CLINICAL DATA:  Initial evaluation for acute right shoulder pain. No injury. EXAM: PORTABLE RIGHT SHOULDER COMPARISON:  None. FINDINGS: No acute fracture or dislocation. Humeral head in normal alignment with the glenoid. AC joint approximated. Degenerative osteoarthritic changes present about the [facility] joint. No periarticular calcification. Osseous mineralization normal. No soft tissue abnormality. IMPRESSION: 1. No acute osseous abnormality about the right shoulder. 2. 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 Electronically Signed   By: Jeannine Boga M.D.   On: 02/18/2016 06:24   Ct Renal Stone Study  Result Date: 02/18/2016 CLINICAL DATA:  Acute right flank pain. EXAM: CT ABDOMEN AND PELVIS WITHOUT CONTRAST TECHNIQUE: Multidetector CT imaging of the abdomen and pelvis was performed following the standard protocol without IV contrast. However, exam is somewhat limited due to respiratory motion artifact. COMPARISON:  None. FINDINGS: Lower chest: Right posterior basilar atelectasis or inflammation is noted with possible associated pleural effusion. Left lung base is clear. Hepatobiliary: No gallstones are noted. Possible mild fatty infiltration is noted of the liver. Pancreas: Normal. Spleen: Normal. Adrenals/Urinary Tract: Adrenal glands and kidneys are  unremarkable. No hydronephrosis or renal obstruction is noted. No renal or ureteral calculi are noted. Urinary bladder appears normal. Stomach/Bowel: There is no evidence of bowel obstruction. However, there does appear to be pneumatosis involving a portion of the transverse colon as well as a portion of the proximal descending colon. This potentially may represent benign pneumatosis, but ischemic bowel must be considered. Vascular/Lymphatic: Atherosclerosis of abdominal aorta is noted without aneurysm formation. No significant adenopathy is noted. Reproductive: Normal prostate gland. Other: No abnormal fluid collection is noted. 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 Possible pars defects is seen at L5. Well-defined lucencies are noted in the L3 and L4 vertebral bodies which may represent hemangiomas, but metastatic disease cannot be excluded. IMPRESSION: No hydronephrosis or renal obstruction is noted. No renal or ureteral calculi are noted. Right posterior basilar atelectasis or inflammation is noted with possible associated pleural effusion. Probable mild fatty infiltration of the liver. Aortic atherosclerosis. Well-defined lucencies are noted in the L3 and L4 vertebral bodies which may represent hemangiomas, but MRI with and without gadolinium is recommended of the lumbar spine to rule out malignancy or metastatic disease. Pneumatosis is noted in the transverse colon as well as proximal descending colon. While this may represent benign pneumatosis, ischemic bowel cannot be excluded and clinical correlation and surgical consultation is recommended. Critical Value/emergent results were called by telephone at the time of interpretation on 02/18/2016 at 7:24 am to Dr. Rolland Porter , who verbally acknowledged these results. Electronically Signed   By: Marijo Conception, M.D.   On: 02/18/2016 07:25   Ct Angio Abd/pel W And/or Wo Contrast  Addendum Date: 02/18/2016    ADDENDUM REPORT: 02/18/2016 12:41 ADDENDUM: Small  right pleural effusion with compressive atelectasis at the right lung base. There is a small focus of low-density and gas within this collapsed right lower lobe. This is seen on sequence 5, image 9. This area measures roughly 2.2 cm. This could represent a small focus of lung necrosis or cavitary pneumonia. This was seen on the recent chest CT from 02/18/2016. Electronically Signed   By: Markus Daft M.D.   On: 02/18/2016 12:41   Result Date: 02/18/2016 CLINICAL DATA:  Follow-up colonic pneumatosis. EXAM: CT ANGIOGRAPHY ABDOMEN AND PELVIS WITH CONTRAST AND WITHOUT CONTRAST TECHNIQUE: Multidetector CT imaging of the abdomen and pelvis was performed using the standard protocol during bolus administration of intravenous contrast. Multiplanar reconstructed images and MIPs were obtained and reviewed to evaluate the vascular anatomy. CONTRAST:  111mL ISOVUE-300 IOPAMIDOL (ISOVUE-300) INJECTION 61% COMPARISON:  CT 02/18/2016 FINDINGS: VASCULAR Aorta: Poor opacification of the arterial structures due to technical difficulties with this examination. Normal caliber of the abdominal aorta with atherosclerotic disease. No evidence for an aortic dissection. Celiac: Celiac trunk is patent without significant stent stenosis at the origin. The main branch vessels are patent. SMA: The SMA is patent with some atherosclerotic plaque at the origin. There does not appear to be a critical stenosis but limited evaluation. Renals: Bilateral renal arteries are patent with calcified plaque at the origin. There does not appear to be critical stenosis but limited evaluation. IMA: Proximal IMA appears to be patent. Inflow: Iliac arteries are calcified without significant aneurysm. Iliac arteries and proximal femoral arteries appear to be patent. Veins: IVC, renal veins and portal venous system are patent. Review of the MIP images confirms the above findings. NON-VASCULAR Lower chest: Again  noted is atelectasis and/or scarring at the left lung base. Small right pleural effusion with compressive atelectasis in the right lower lobe. Coronary artery calcifications. Hepatobiliary: Normal appearance of the liver, gallbladder and portal venous system. Pancreas: Normal appearance of the pancreas without inflammation or duct dilatation. Spleen: Normal appearance of spleen without enlargement. Adrenals/Urinary Tract: Normal adrenal glands. Probable cyst in the left kidney interpolar region without hydronephrosis. Normal appearance of the right kidney without hydronephrosis. Moderate distention of the urinary bladder without gross abnormality. Stomach/Bowel: Again noted is pneumatosis involving the transverse colon and a segment of the descending colon. There is no significant colonic wall thickening. No evidence for small bowel dilatation or obstruction. Cecum is located in the right upper abdomen and difficult to exclude some pneumatosis involving the cecum. Lymphatic: No significant lymphadenopathy in the abdomen or pelvis. Reproductive: Prostate is unremarkable. Other: No evidence for abdominal or pelvic ascites. There is no free intraperitoneal air. No evidence for portal gas. 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 There is a large lucency involving the L4 vertebral body which may be related to a Schmorl's node. There is also large low-density structure in the posterior aspect of the L3 vertebral body which could be related to a large Schmorl's node. IMPRESSION: VASCULAR Limited evaluation of the aorta and visceral arteries due to poor contrast opacification and technical issues with this examination. No gross abnormality to the aorta such as an aneurysm or dissection. Main visceral arteries appear to be patent without significant stenosis. NON-VASCULAR Stable colonic pneumatosis. The pneumatosis is involving the transverse colon and a segment of the descending  colon. No significant change from the previous examination and etiology is unknown. No focal bowel wall thickening. No free intraperitoneal air. Again noted are lucent lesions within the L3  and L4 vertebral bodies. These could be related to very large Schmorl's nodes but indeterminate. Electronically Signed: By: Markus Daft M.D. On: 02/18/2016 12:22   US Thoracentesis Asp Pleural Space W/img Guide  Result Date: 02/29/2016 INDICATION: RIGHT pleural effusion EXAM: ULTRASOUND GUIDED RIGHT THORACENTESIS MEDICATIONS: None. COMPLICATIONS: None immediate. PROCEDURE: Procedure, benefits, and risks of procedure were discussed with patient. Written informed consent for procedure was obtained. Time out protocol followed. Pleural effusion localized by ultrasound at the posterior RIGHT hemithorax. Skin prepped and draped in usual sterile fashion. Skin and soft tissues anesthetized with 10 mL of 1% lidocaine. 8 French thoracentesis catheter placed into the RIGHT pleural space. 800 mL of dark old bloody RIGHT pleural aspirated by syringe pump. Procedure tolerated well by patient without immediate complication. FINDINGS: A total of approximately 800 ml of RIGHT pleural fluid was removed. Samples were sent to the laboratory as requested by the clinical team. IMPRESSION: Successful ultrasound guided RIGHT thoracentesis yielding 800 mL of pleural fluid. Electronically Signed   By: Lavonia Dana M.D.   On: 02/29/2016 15:18    Time Spent in minutes  30   Jani Gravel M.D on 03/06/2016 at 8:38 AM  Between 7am to 7am- Pager - [phone]

## 2016-03-06 NOTE — Consult Note (Signed)
[place] Nurse wound consult note Reason for Consult: Intertrigious dermatitis in the sub pannicular, bilateral inguinal and scrotal areas. Body habitus is preventing adequate turning and repositioning in the standard hospital bed, will provide a bariatric bed with low air loss feature. Bilateral heel boot with pressure redistribution feature for patient at high risk for skin breakdown. Wound type: Moisture  Pressure Ulcer POA: No Measurement: Diffuse erythema and skin peeling consistent with moisture associated skin damage and fungal overgrowth in the perineal area, bilateral inguinal and sub pannicular areas.. Wound bed: Pinpoint scattered partial thickness open areas, pink moist Drainage (amount, consistency, odor) Scant serous Periwound: Intact dry Dressing procedure/placement/frequency: I will today provide a bariatric bed to facilitate adequate turning and repositioning as well as to manage the moist microclimate, discontinue the nystatin cream in favor of using the antimicrobial textile to wick moisture and provide pressure redistribution heel boot to prevent pressure injury.  Suggest a one-time dosing with a systemic antifungal (e.g., Diflucan) unless contraindicated.  If you agree, please order for expedited resolution of the fungal overgrowth. [place] nursing team will not follow, but will remain available to this patient, the nursing and medical teams.  Please re-consult if needed. Thanks, Maudie Flakes, MSN, RN, [place], [facility]  Pager# [phone]

## 2016-03-06 NOTE — Progress Notes (Signed)
Discussed with Dr. Earlene Plater of cardiovascular surgery. He agrees he needs VATS or thoracotomy. He will be transferred [facility] to hospitalist service today

## 2016-03-06 NOTE — Progress Notes (Addendum)
Pharmacy Antibiotic Note  Richard Fields is a 75 y.o. male admitted on 02/18/2016 with empyema.  Pharmacy has been consulted for vancomycin and cefepime dosing. Originally orders in for Zosyn but patient has penicillin allergy. Spoke to patient who was unable to clarify information regarding allergy. Will use cefepime for now, but may be able to follow up with family when they visit to clarify nature of allergy. Of note, patient had therapeutic vancomycin trough on Vancomycin 1250mg  every 8 hours so will continue this same dose.  Plan: Vancomycin 1250 IV every 8 hours.  Goal trough 15-20 mcg/mL. Cefepime 2 grams every 12 hours  Follow renal function closely, and plans for LOT  Height: 5\' 11"  (180.3 cm) Weight: 299 lb 2.6 oz (135.7 kg) IBW/kg (Calculated) : 75.3  Temp (24hrs), Avg:98.4 F (36.9 C), Min:97.2 F (36.2 C), Max:99 F (37.2 C)   Recent Labs Lab 03/01/16 0812 03/02/16 0357 03/03/16 0355 03/04/16 0408 03/05/16 1101 03/06/16 0456  WBC  --  12.6* 14.4* 12.4* 14.3* 11.3*  CREATININE  --  0.51* 0.55* 0.50* 0.78 0.55*  LATICACIDVEN  --   --   --   --  1.4  --   VANCOTROUGH 18  --   --   --   --   --     Estimated Creatinine Clearance: 108.8 mL/min (by C-G formula based on SCr of 0.55 mg/dL (L)).    Allergies  Allergen Reactions  . Penicillins Other (See Comments)    Unknown-reaction is unknown  . Tegretol [Carbamazepine] Itching    Antimicrobials this admission: vanco 10/19 > 10/31 >>> 11/5 >  primaxin 10/19 > 10/31 levaquin 10/19 > 11/5 Cefepime 11/5 >   Dose adjustments this admission: NA  Microbiology results: 11/5 Pleural fluid: pending  Thank you for allowing pharmacy to be a part of this patient's care.  Melburn Popper, PharmD Clinical Pharmacy Resident Pager: [phone] 03/06/16 11:36 AM

## 2016-03-06 NOTE — H&P (Addendum)
[facility] History and Physical  Richard Fields [id number] DOB: 02/11/1941 DOA: 02/18/2016  Referring physician: Dr Luan Pulling, Cheri Guppy PCP: Asencion Noble, MD   Chief Complaint: empyema  HPI: Richard Fields is a 75 y.o. male significant past medical history diabetes type hypertension, hyperlipidemia, peripheral neuropathy, and dementia presented to outside hospital [facility]) on 02/18/2016 for pneumonia, found to have a cavitary pneumonia with hemoptysis, started on broad-spectrum antibiotics.  Patient was found to be tachypneic on 10/21 and transferred to the ICU for further evaluation.  He was intubated on 02/20/2016 and treated for sepsis.  His steroids were increased as well.  Extubated 02/23/2016.  GI was consulted on 02/28/2016 for dark black tarry stools with no signs of hematemesis or melena hematochezia. Hemoccult was positive at the time.  GI recommend patient received packed red blood cell transfusion recommended PPI IV therapy as well as the time.   No endoscopy recommended at time. GI recommended EGD prior to discharge.   Patient was status post ultrasound-guided right thoracentesis on 02/29/2016, with 100 mL of dark old bloody fluid drained.  IV antibiotics and IV steroids were discontinued and transitioned to by mouth Levaquin on 03/01/2016, by mouth prednisone was added.  Yesterday patient developed low blood pressures and improved with normal saline 250 mL bolus. CT chest was ordered, which shows more fluid, concerning for a loculated effusion/empyema.  The pulmonologist, Dr. Luan Pulling, discussed with thoracic surgeon, Dr. Cyndia Bent, who recommended transfer to our faciliyt for a VATS procedure.   Of note, on arrival to our hospital, RN mentioned that wife was concerned about pt's mental status, given hx of dementia, she is concerned about VATS procedure, and considering Palliative care.  Pt c/o of hunger to me on exam, as well as pain in his legs/feet. Poor historian.  All history is obtained by prior notes and discussion with physicians.   Review of Systems:  Per above, limited due to patient's dementia.  Past Medical History:  Diagnosis Date  . Cervical disc disease   . Chronic constipation   . Diabetes mellitus without complication ([place])    Borderline diabetic-diet controlled  . Hyperlipidemia   . Hypertension   . Peripheral neuropathy [facility])    Past Surgical History:  Procedure Laterality Date  . COLONOSCOPY  2003   RMR: internal hemorrhoids, otherwise normal  . COLONOSCOPY  05/30/2012   Procedure: COLONOSCOPY;  Surgeon: Daneil Dolin, MD;  Location: [facility];  Service: Endoscopy;  Laterality: N/A;  12:00  . HERNIA REPAIR    . TONSILLECTOMY     Social History:  reports that he has been smoking.  He has smoked for the past 40.00 years. He has quit using smokeless tobacco. His smokeless tobacco use included Chew. He reports that he does not drink alcohol or use drugs.  Allergies  Allergen Reactions  . Penicillins Other (See Comments)    Unknown-reaction is unknown  . Tegretol [Carbamazepine] Itching    Family History  Problem Relation Age of Onset  . Cancer Father   . Colon cancer Paternal Uncle   . Diabetes Brother     Prior to Admission medications   Medication Sig Start Date End Date Taking? Authorizing Provider  AMITIZA 24 MCG capsule Take 2 capsules by mouth daily. 08/14/13  Yes Historical Provider, MD  Aspirin-Salicylamide-Caffeine (BC HEADACHE POWDER PO) Take 1 Package by mouth daily as needed (pain).   Yes Historical Provider, MD  atorvastatin (LIPITOR) 10 MG tablet Take 10 mg by mouth daily.  05/10/12  Yes Historical Provider, MD  benzonatate (TESSALON) 200 MG capsule Take 200 mg by mouth 3 (three) times daily as needed for cough.   Yes Historical Provider, MD  furosemide (LASIX) 40 MG tablet Take 1 tablet (40 mg total) by mouth 2 (two) times daily. Patient taking differently: Take 40 mg by mouth daily.  10/12/15  Yes  Davonna Belling, MD  HYDROcodone-acetaminophen (NORCO) 7.5-325 MG tablet Take 1 tablet by mouth every 6 (six) hours as needed for moderate pain (Must last 30 days.Do not drive or operate machinery while taking this medicine.). 01/14/16  Yes Sanjuana Kava, MD  LORazepam (ATIVAN) 2 MG tablet Take 2 mg by mouth at bedtime.   Yes Historical Provider, MD  losartan (COZAAR) 50 MG tablet Take 50 mg by mouth daily.   Yes Historical Provider, MD  metFORMIN (GLUCOPHAGE) 500 MG tablet Take 1 tablet by mouth 2 (two) times daily. 02/08/16  Yes Historical Provider, MD  mirtazapine (REMERON) 15 MG tablet Take 15 mg by mouth at bedtime.  08/18/14  Yes Historical Provider, MD  potassium chloride SA (K-DUR,KLOR-CON) 20 MEQ tablet Take 20 mEq by mouth daily.  08/26/13  Yes Historical Provider, MD  pregabalin (LYRICA) 150 MG capsule Take 150 mg by mouth 2 (two) times daily.   Yes Historical Provider, MD  traZODone (DESYREL) 50 MG tablet Take 2 tablets by mouth at bedtime.  07/28/14  Yes Historical Provider, MD   Physical Exam: Vitals:   03/06/16 0600 03/06/16 0808 03/06/16 0900 03/06/16 0940  BP:   (!) 115/53   Pulse: 98  (!) 107   Resp: 20  (!) 23   Temp:    97.2 F (36.2 C)  TempSrc:    Oral  SpO2: 96% 97% 95%   Weight:      Height:        Wt Readings from Last 3 Encounters:  03/06/16 135.7 kg (299 lb 2.6 oz)  12/02/15 129.3 kg (285 lb)  10/12/15 127 kg (280 lb)    General:  Appears calm and comfortable, pleasant, NAD, AAO self, not to place. Morbid obese. Eyes: PERRL, normal lids, irises & conjunctiva ENT: grossly normal hearing, lips & tongue, mmm Neck: no LAD, masses or thyromegaly Cardiovascular: RRR, no m/r/g. No LE edema. Telemetry: SR, no arrhythmias  Respiratory: diminished bs, dull at right base.  no w/r/r. Normal respiratory effort. Abdomen: soft, ntnd, morbid obese Skin: at least stage 2 decubitus ulcer, +tinia cruris.   Musculoskeletal: grossly normal tone BUE/BLE Psychiatric: grossly  normal mood and affect, speech fluent and appropriate Neurologic: grossly non-focal. + foley          Labs on Admission:  Basic Metabolic Panel:  Recent Labs Lab 03/02/16 0357 03/03/16 0355 03/04/16 0408 03/05/16 1101 03/06/16 0456  NA 138 135 136 132* 134*  K 3.2* 3.3* 4.0 4.4 4.0  CL 97* 95* 101 97* 100*  CO2 36* 35* 31 29 30   GLUCOSE 165* 153* 120* 250* 162*  BUN 11 11 10 13 11   CREATININE 0.51* 0.55* 0.50* 0.78 0.55*  CALCIUM 7.5* 7.6* 7.6* 7.8* 7.7*   Liver Function Tests:  Recent Labs Lab 03/05/16 1101 03/06/16 0456  AST 16 13*  ALT 19 15*  ALKPHOS 95 78  BILITOT 1.2 0.8  PROT 5.9* 5.0*  ALBUMIN 2.3* 1.9*   No results for input(s): LIPASE, AMYLASE in the last 168 hours. No results for input(s): AMMONIA in the last 168 hours. CBC:  Recent Labs Lab 02/29/16 0400  03/01/16 0357  03/02/16 0357 03/03/16 0355 03/04/16 0408 03/05/16 1101 03/06/16 0456  WBC 15.3*  --  13.8* 12.6* 14.4* 12.4* 14.3* 11.3*  NEUTROABS 13.7*  --  12.2* 9.8* 11.3*  --   --   --   HGB 8.5*  < > 8.1* 8.0* 9.1* 8.9* 9.9* 8.7*  HCT 25.5*  < > 25.2* 24.9* 28.2* 28.0* 31.4* 27.7*  MCV 90.4  --  92.3 92.9 93.4 94.6 94.3 95.2  PLT 241  --  240 251 295 321 350 327  < > = values in this interval not displayed. Cardiac Enzymes:  Recent Labs Lab 03/05/16 1101 03/05/16 1536 03/05/16 2250  TROPONINI 0.13* 0.11* 0.12*    BNP (last 3 results)  Recent Labs  02/18/16 0517  BNP 50.0    ProBNP (last 3 results) No results for input(s): PROBNP in the last 8760 hours.  CBG:  Recent Labs Lab 03/04/16 2036 03/05/16 0731 03/05/16 1120 03/05/16 1652 03/06/16 0800  GLUCAP 156* 165* 255* 222* 154*    Radiological Exams on Admission: Dg Chest 1 View  Result Date: 03/05/2016 CLINICAL DATA:  Tachycardia. EXAM: CHEST 1 VIEW COMPARISON:  02/29/2016 and CT from 02/29/2016 FINDINGS: Again noted is a large right pleural effusion with compressive atelectasis. Only a small portion of the  right lung is aerated. Right pleural fluid has increased since the prior chest radiograph. Left lung remains clear. Cardiac silhouette is grossly stable in size but partially obscured by the right chest densities. Negative for pneumothorax. Patient is slightly rotated towards the right. IMPRESSION: Large right pleural effusion with compressive atelectasis. Only a small portion of the right lung is aerated. The right pleural fluid has slightly enlarged since 02/29/2016. Electronically Signed   By: Markus Daft M.D.   On: 03/05/2016 11:28   Ct Angio Chest Pe W Or Wo Contrast  Result Date: 03/05/2016 CLINICAL DATA:  Elevated D-dimer with shortness of breath. EXAM: CT ANGIOGRAPHY CHEST WITH CONTRAST TECHNIQUE: Multidetector CT imaging of the chest was performed using the standard protocol during bolus administration of intravenous contrast. Multiplanar CT image reconstructions and MIPs were obtained to evaluate the vascular anatomy. CONTRAST:  125 mL Isovue 370 COMPARISON:  Chest CT 02/29/2016 FINDINGS: Cardiovascular: No evidence for a pulmonary embolism. Normal caliber of the thoracic aorta and no evidence for aortic dissection. There is flow in the great vessels. Coronary artery calcifications. Mediastinum/Nodes: There is no significant chest lymphadenopathy. However, there is low-density material along the right anterior mediastinum. This was present on the previous examination but not present on the exam from 02/18/2016. This may represent loculated fluid.Esophagus is unremarkable. No significant axillary lymphadenopathy. Lungs/Pleura: There is a massive right pleural effusion occupying most of the right chest. There is complete collapse of the right middle lobe and right lower lobe. Small portion of the right upper lobe is still aerated. Left lung is clear. There is mild atelectasis at the left lung base. Upper Abdomen: No acute abnormality. Musculoskeletal: No acute bone abnormality. Review of the MIP images  confirms the above findings. IMPRESSION: No evidence for pulmonary embolism. No evidence for aortic dissection. Large right pleural effusion causing collapse of the right middle lobe and right lower lobe. Only a small portion of the right upper lobe is aerated. In addition, there appears to be loculated fluid extending into the right side of the mediastinum. Electronically Signed   By: Markus Daft M.D.   On: 03/05/2016 14:42    EKG: Independently reviewed.   EKG Interpretation  Date/Time:  Thursday  February 18 2016 05:15:32 EDT Ventricular Rate:  113 PR Interval:    QRS Duration: 86 QT Interval:  367 QTC Calculation: 504 R Axis:   44 Text Interpretation:  Sinus or ectopic atrial tachycardia Borderline T wave abnormalities Prolonged QT interval No old tracing to compare Confirmed by KNAPP  MD-I, IVA ([id number]) on 02/18/2016 5:18:53 AM        Assessment/Plan Principal Problem:   Cavitary pneumonia Active Problems:   Hemoptysis   Morbid obesity (HCC)   Pneumatosis intestinalis   Pressure injury of skin   Type II diabetes mellitus, uncontrolled (HCC)   Sepsis (HCC)   Elevated troponin level   Acute respiratory failure with hypoxia (HCC)   Encephalopathy, metabolic   Acute upper GI bleeding   Acute blood loss anemia   Protein-calorie malnutrition (HCC)   Abdominal pain, right lateral   Aspirin long-term use   Hypoxia   Pleural effusion on right   S/P thoracentesis   SOB (shortness of breath)   1. Cavitary pneumonia/sepsis with new concerns for empyema on the right lung - Admit to stepdown unit - For surgical evaluation with cardiothoracic surgery, Dr. Cyndia Bent to see today for anticipated VATS procedures/thoracotomy tomorrow.   Appreciate all help, will f/u w/ recds. - Pulm/CC Dr Annamaria Boots also consulted, Appreciate all help, will f/u w/ recds. - npo at [facility] - broad-spectrum abx started.  Patient has a penicillin allergy; not clear what type of allergy, so we transitioned to  vancomycin/cefepime IV for now.  Pharmacy to assist w/ dosing for vancomycin and cefepime. - There is a question of palliative care.  Our nurse mentioned that wife had mentioned to the other doctors in the outside facility about palliative care due to concerns of dementia. There is no note of it in any notes that I reviewed. I attempted to call the wife, but there was no answer; left my pager number for her to call me back.   2. Right empyema/loculated effusion - pulm/cc cs as well, Dr Annamaria Boots cs as well, appreciate assitance  3. Anemia with history of possible black tarry stool / upper GI bleed during this hospitalization, status post transfusions, in setting of acute illness -  Remains hemodynamically stable.  - GI evaluated the patient. Recommend PPI and recommend EGD prior to discharge  4. Diabetes type 2 - Patient was on Lantus 40 units. We'll half the dose to 20 units secondary to npo status for OR tomorrow  - continue sliding scale insulin.   5. COPD - Continue prednisone 50 mg by mouth daily for now; defer to pulmonary  6. htn - stable, on home regimen losartan  7. hld - on statin  8.  tenea cruris - nystatin powder  9. Stage 2 decubitus - frequent turns - wound care c/s  Deconditioning  - will need physical therapy evaluation after surgery.  CS: I personally dicussed with Dr Cyndia Bent, CTS, and Dr Annamaria Boots, Pulm/cc and they are aware of patient's arrival. Appreciate all assistance.  Code Status: Full DVT Prophylaxis: hep [place] tid, hold at [facility] for surg; SCDS Family Communication: patient, attempted to call wife (only phn [facility] is home - no answer, attempted to leave vm not certain if successful) Disposition Plan:  When clinically improved  Time spent: 4mins  Maren Reamer MD., MBA/MHA [facility] Pager [phone]

## 2016-03-06 NOTE — Progress Notes (Signed)
I was asked to reassess him after his CT came back. This shows much more fluid. Some of this appears to be loculated. I will discuss with thoracic surgery. He is back in stepdown which I think is appropriate.

## 2016-03-06 NOTE — Consult Note (Signed)
[address]       [place] [address]             [phone]      Cardiothoracic Surgery Consultation  Reason for Consult: Pneumonia with right empyema Referring Physician: Dr. Velvet Bathe  Richard Fields is an 75 y.o. male.  HPI:   The patient is a 75 year old gentleman with DM with severe peripheral neuropathy, hypertension and hyperlipidemia who was admitted to [facility] 02/18/2016 with RLL cavitary pneumonia. He is currently not able to answer questions due to his illness but his wife and her friend are here. His wife does have some memory disturbance so her good friend tries to be there for both of them. He had been marginal for a long time according to them with some confusion at times, generalized weakness and limited mobility due to neuropathy. He was admitted with a several week history of mild hemoptysis and right back pain. His CT on admission showed a small right effusion. A follow up scan on 02/29/2016 showed a large right pleural effusion with severe compressive atelectasis of the right lung. He had a thoracentesis on 10/30 removing 800 cc of old dark bloody fluid. He has continued to have respiratory difficulty and a follow up CT yesterday showed no PE. It did show a persistent/recurrent large loculated right pleural effusion with near complete collapse of the right lung. He was transferred here this afternoon from [facility] for further evaluation.  Past Medical History:  Diagnosis Date  . Cervical disc disease   . Chronic constipation   . Diabetes mellitus without complication ([place])    Borderline diabetic-diet controlled  . Hyperlipidemia   . Hypertension   . Peripheral neuropathy [facility])     Past Surgical History:  Procedure Laterality Date  . COLONOSCOPY  2003   RMR: internal hemorrhoids, otherwise normal  . COLONOSCOPY  05/30/2012   Procedure: COLONOSCOPY;  Surgeon: Daneil Dolin, MD;  Location: [facility]O SUITE;  Service: Endoscopy;   Laterality: N/A;  12:00  . HERNIA REPAIR    . TONSILLECTOMY      Family History  Problem Relation Age of Onset  . Cancer Father   . Colon cancer Paternal Uncle   . Diabetes Brother     Social History:  reports that he has been smoking.  He has smoked for the past 40.00 years. He has quit using smokeless tobacco. His smokeless tobacco use included Chew. He reports that he does not drink alcohol or use drugs.  Allergies:  Allergies  Allergen Reactions  . Penicillins Other (See Comments)    Unknown-reaction is unknown  . Tegretol [Carbamazepine] Itching    Medications:  I have reviewed the patient's current medications. Prior to Admission:  Prescriptions Prior to Admission  Medication Sig Dispense Refill Last Dose  . AMITIZA 24 MCG capsule Take 2 capsules by mouth daily.   02/17/2016 at Unknown time  . Aspirin-Salicylamide-Caffeine (BC HEADACHE POWDER PO) Take 1 Package by mouth daily as needed (pain).   02/17/2016 at Unknown time  . atorvastatin (LIPITOR) 10 MG tablet Take 10 mg by mouth daily.    02/17/2016 at Unknown time  . benzonatate (TESSALON) 200 MG capsule Take 200 mg by mouth 3 (three) times daily as needed for cough.   unknown  . furosemide (LASIX) 40 MG tablet Take 1 tablet (40 mg total) by mouth 2 (two) times daily. (Patient taking differently: Take 40 mg by mouth daily. )  4 tablet 0 02/17/2016 at Unknown time  . HYDROcodone-acetaminophen (NORCO) 7.5-325 MG tablet Take 1 tablet by mouth every 6 (six) hours as needed for moderate pain (Must last 30 days.Do not drive or operate machinery while taking this medicine.). 180 tablet 0 unknown  . LORazepam (ATIVAN) 2 MG tablet Take 2 mg by mouth at bedtime.   02/17/2016 at Unknown time  . losartan (COZAAR) 50 MG tablet Take 50 mg by mouth daily.   02/17/2016 at Unknown time  . metFORMIN (GLUCOPHAGE) 500 MG tablet Take 1 tablet by mouth 2 (two) times daily.   02/17/2016 at Unknown time  . mirtazapine (REMERON) 15 MG tablet Take  15 mg by mouth at bedtime.    02/17/2016 at Unknown time  . potassium chloride SA (K-DUR,KLOR-CON) 20 MEQ tablet Take 20 mEq by mouth daily.    02/17/2016 at Unknown time  . pregabalin (LYRICA) 150 MG capsule Take 150 mg by mouth 2 (two) times daily.   02/17/2016 at Unknown time  . traZODone (DESYREL) 50 MG tablet Take 2 tablets by mouth at bedtime.    02/17/2016 at Unknown time   Scheduled: . sodium chloride   Intravenous Once  . albuterol  2.5 mg Nebulization TID  . atorvastatin  10 mg Oral q1800  . ceFEPime (MAXIPIME) IV  2 g Intravenous Q12H  . heparin subcutaneous  5,000 Units Subcutaneous Q8H  . insulin aspart  0-20 Units Subcutaneous TID AC & HS  . [START ON 03/07/2016] insulin glargine  20 Units Subcutaneous Daily  . lidocaine      . LORazepam  1 mg Oral BID  . losartan  50 mg Oral Daily  . mirtazapine  15 mg Oral QHS  . nystatin cream   Topical TID  . pantoprazole  40 mg Oral BID AC  . predniSONE  15 mg Oral Q breakfast  . pregabalin  150 mg Oral BID  . sodium chloride flush  10-40 mL Intracatheter Q12H  . sodium chloride flush  3 mL Intravenous Q12H  . traZODone  100 mg Oral QHS  . vancomycin  1,250 mg Intravenous Q8H   Continuous: . sodium chloride 10 mL/hr at 03/04/16 0849   JJK:KXFGHWEXH, alum & mag hydroxide-simeth, HYDROcodone-acetaminophen, morphine injection, ondansetron **OR** ondansetron (ZOFRAN) IV, senna-docusate, sodium chloride flush Anti-infectives    Start     Dose/Rate Route Frequency Ordered Stop   03/06/16 1915  vancomycin (VANCOCIN) 1,250 mg in sodium chloride 0.9 % 250 mL IVPB  Status:  Discontinued     1,250 mg 166.7 mL/hr over 90 Minutes Intravenous Every 8 hours 03/06/16 1139 03/06/16 1142   03/06/16 1400  piperacillin-tazobactam (ZOSYN) IVPB 3.375 g  Status:  Discontinued     3.375 g 100 mL/hr over 30 Minutes Intravenous Every 8 hours 03/06/16 1107 03/06/16 1115   03/06/16 1200  vancomycin (VANCOCIN) 1,250 mg in sodium chloride 0.9 % 250 mL IVPB      1,250 mg 166.7 mL/hr over 90 Minutes Intravenous Every 8 hours 03/06/16 1142     03/06/16 1130  ceFEPIme (MAXIPIME) 2 g in dextrose 5 % 50 mL IVPB     2 g 100 mL/hr over 30 Minutes Intravenous Every 12 hours 03/06/16 1129     03/06/16 1115  vancomycin (VANCOCIN) IVPB 1000 mg/200 mL premix  Status:  Discontinued     1,000 mg 200 mL/hr over 60 Minutes Intravenous Every 12 hours 03/06/16 1107 03/06/16 1113   03/06/16 1115  vancomycin (VANCOCIN) 1,250 mg in sodium chloride 0.9 %  250 mL IVPB  Status:  Discontinued     1,250 mg 166.7 mL/hr over 90 Minutes Intravenous Every 12 hours 03/06/16 1113 03/06/16 1139   03/01/16 1000  levofloxacin (LEVAQUIN) tablet 500 mg  Status:  Discontinued     500 mg Oral Daily 03/01/16 0908 03/06/16 1129   02/21/16 1600  vancomycin (VANCOCIN) 1,250 mg in sodium chloride 0.9 % 250 mL IVPB  Status:  Discontinued     1,250 mg 166.7 mL/hr over 90 Minutes Intravenous Every 8 hours 02/21/16 0855 02/21/16 0857   02/21/16 0900  vancomycin (VANCOCIN) 1,250 mg in sodium chloride 0.9 % 250 mL IVPB  Status:  Discontinued     1,250 mg 166.7 mL/hr over 90 Minutes Intravenous Every 8 hours 02/21/16 0857 03/01/16 0907   02/19/16 1600  vancomycin (VANCOCIN) IVPB 1000 mg/200 mL premix  Status:  Discontinued     1,000 mg 200 mL/hr over 60 Minutes Intravenous Every 8 hours 02/19/16 0758 02/21/16 0855   02/19/16 0800  vancomycin (VANCOCIN) 1,500 mg in sodium chloride 0.9 % 500 mL IVPB     1,500 mg 250 mL/hr over 120 Minutes Intravenous  Once 02/19/16 0758 02/19/16 1127   02/18/16 1700  vancomycin (VANCOCIN) IVPB 1000 mg/200 mL premix  Status:  Discontinued     1,000 mg 200 mL/hr over 60 Minutes Intravenous Every 8 hours 02/18/16 1129 02/18/16 1328   02/18/16 1600  imipenem-cilastatin (PRIMAXIN) 500 mg in sodium chloride 0.9 % 100 mL IVPB  Status:  Discontinued     500 mg 200 mL/hr over 30 Minutes Intravenous Every 6 hours 02/18/16 1358 03/01/16 0907   02/18/16 0900  vancomycin  (VANCOCIN) 1,500 mg in sodium chloride 0.9 % 500 mL IVPB     1,500 mg 250 mL/hr over 120 Minutes Intravenous  Once 02/18/16 0816 02/18/16 1250   02/18/16 0700  levofloxacin (LEVAQUIN) IVPB 750 mg     750 mg 100 mL/hr over 90 Minutes Intravenous  Once 02/18/16 0651 02/18/16 0957      Results for orders placed or performed during the hospital encounter of 02/18/16 (from the past 48 hour(s))  Glucose, capillary     Status: Abnormal   Collection Time: 03/04/16  4:32 PM  Result Value Ref Range   Glucose-Capillary 155 (H) 65 - 99 mg/dL   Comment 1 Notify RN   Glucose, capillary     Status: Abnormal   Collection Time: 03/04/16  8:36 PM  Result Value Ref Range   Glucose-Capillary 156 (H) 65 - 99 mg/dL   Comment 1 Notify RN    Comment 2 Document in Chart   Glucose, capillary     Status: Abnormal   Collection Time: 03/05/16  7:31 AM  Result Value Ref Range   Glucose-Capillary 165 (H) 65 - 99 mg/dL   Comment 1 Notify RN   CBC     Status: Abnormal   Collection Time: 03/05/16 11:01 AM  Result Value Ref Range   WBC 14.3 (H) 4.0 - 10.5 K/uL   RBC 3.33 (L) 4.22 - 5.81 MIL/uL   Hemoglobin 9.9 (L) 13.0 - 17.0 g/dL   HCT 31.4 (L) 39.0 - 52.0 %   MCV 94.3 78.0 - 100.0 fL   MCH 29.7 26.0 - 34.0 pg   MCHC 31.5 30.0 - 36.0 g/dL   RDW 16.3 (H) 11.5 - 15.5 %   Platelets 350 150 - 400 K/uL  Comprehensive metabolic panel     Status: Abnormal   Collection Time: 03/05/16 11:01 AM  Result Value Ref Range   Sodium 132 (L) 135 - 145 mmol/L   Potassium 4.4 3.5 - 5.1 mmol/L   Chloride 97 (L) 101 - 111 mmol/L   CO2 29 22 - 32 mmol/L   Glucose, Bld 250 (H) 65 - 99 mg/dL   BUN 13 6 - 20 mg/dL   Creatinine, Ser 0.78 0.61 - 1.24 mg/dL   Calcium 7.8 (L) 8.9 - 10.3 mg/dL   Total Protein 5.9 (L) 6.5 - 8.1 g/dL   Albumin 2.3 (L) 3.5 - 5.0 g/dL   AST 16 15 - 41 U/L   ALT 19 17 - 63 U/L   Alkaline Phosphatase 95 38 - 126 U/L   Total Bilirubin 1.2 0.3 - 1.2 mg/dL   GFR calc non Af Amer >60 >60 mL/min   GFR  calc Af Amer >60 >60 mL/min    Comment: (NOTE) The eGFR has been calculated using the CKD EPI equation. This calculation has not been validated in all clinical situations. eGFR's persistently <60 mL/min signify possible Chronic Kidney Disease.    Anion gap 6 5 - 15  Troponin I (q 6hr x 3)     Status: Abnormal   Collection Time: 03/05/16 11:01 AM  Result Value Ref Range   Troponin I 0.13 (HH) <0.03 ng/mL    Comment: CRITICAL RESULT CALLED TO, READ BACK BY AND VERIFIED WITH: [email or web address] AT 1219 ON 03/05/2016 BY WOODS.M   TSH     Status: None   Collection Time: 03/05/16 11:01 AM  Result Value Ref Range   TSH 0.673 0.350 - 4.500 uIU/mL    Comment: Performed by a 3rd Generation assay with a functional sensitivity of <=0.01 uIU/mL.  D-dimer, quantitative (not at [facility])     Status: Abnormal   Collection Time: 03/05/16 11:01 AM  Result Value Ref Range   D-Dimer, Quant 2.35 (H) 0.00 - 0.50 ug/mL-FEU    Comment: (NOTE) At the manufacturer cut-off of 0.50 ug/mL FEU, this assay has been documented to exclude PE with a sensitivity and negative predictive value of 97 to 99%.  At this time, this assay has not been approved by the FDA to exclude DVT/VTE. Results should be correlated with clinical presentation.   Lactic acid, plasma     Status: None   Collection Time: 03/05/16 11:01 AM  Result Value Ref Range   Lactic Acid, Venous 1.4 0.5 - 1.9 mmol/L  Glucose, capillary     Status: Abnormal   Collection Time: 03/05/16 11:20 AM  Result Value Ref Range   Glucose-Capillary 255 (H) 65 - 99 mg/dL   Comment 1 Notify RN   Troponin I (q 6hr x 3)     Status: Abnormal   Collection Time: 03/05/16  3:36 PM  Result Value Ref Range   Troponin I 0.11 (HH) <0.03 ng/mL    Comment: CRITICAL RESULT CALLED TO, READ BACK BY AND VERIFIED WITH: EVERETTE. R AT 4235 ON 03/05/2016 BY WOODS.M   Glucose, capillary     Status: Abnormal   Collection Time: 03/05/16  4:52 PM  Result Value Ref Range   Glucose-Capillary  222 (H) 65 - 99 mg/dL  Troponin I (q 6hr x 3)     Status: Abnormal   Collection Time: 03/05/16 10:50 PM  Result Value Ref Range   Troponin I 0.12 (HH) <0.03 ng/mL    Comment: CRITICAL VALUE NOTED.  VALUE IS CONSISTENT WITH PREVIOUSLY REPORTED AND CALLED VALUE.  CBC     Status: Abnormal   Collection Time:  03/06/16  4:56 AM  Result Value Ref Range   WBC 11.3 (H) 4.0 - 10.5 K/uL   RBC 2.91 (L) 4.22 - 5.81 MIL/uL   Hemoglobin 8.7 (L) 13.0 - 17.0 g/dL   HCT 27.7 (L) 39.0 - 52.0 %   MCV 95.2 78.0 - 100.0 fL   MCH 29.9 26.0 - 34.0 pg   MCHC 31.4 30.0 - 36.0 g/dL   RDW 16.3 (H) 11.5 - 15.5 %   Platelets 327 150 - 400 K/uL  Comprehensive metabolic panel     Status: Abnormal   Collection Time: 03/06/16  4:56 AM  Result Value Ref Range   Sodium 134 (L) 135 - 145 mmol/L   Potassium 4.0 3.5 - 5.1 mmol/L   Chloride 100 (L) 101 - 111 mmol/L   CO2 30 22 - 32 mmol/L   Glucose, Bld 162 (H) 65 - 99 mg/dL   BUN 11 6 - 20 mg/dL   Creatinine, Ser 0.55 (L) 0.61 - 1.24 mg/dL   Calcium 7.7 (L) 8.9 - 10.3 mg/dL   Total Protein 5.0 (L) 6.5 - 8.1 g/dL   Albumin 1.9 (L) 3.5 - 5.0 g/dL   AST 13 (L) 15 - 41 U/L   ALT 15 (L) 17 - 63 U/L   Alkaline Phosphatase 78 38 - 126 U/L   Total Bilirubin 0.8 0.3 - 1.2 mg/dL   GFR calc non Af Amer >60 >60 mL/min   GFR calc Af Amer >60 >60 mL/min    Comment: (NOTE) The eGFR has been calculated using the CKD EPI equation. This calculation has not been validated in all clinical situations. eGFR's persistently <60 mL/min signify possible Chronic Kidney Disease.    Anion gap 4 (L) 5 - 15  Glucose, capillary     Status: Abnormal   Collection Time: 03/06/16  8:00 AM  Result Value Ref Range   Glucose-Capillary 154 (H) 65 - 99 mg/dL  Glucose, capillary     Status: Abnormal   Collection Time: 03/06/16 11:42 AM  Result Value Ref Range   Glucose-Capillary 152 (H) 65 - 99 mg/dL   Comment 1 Capillary Specimen     Dg Chest 1 View  Result Date: 03/05/2016 CLINICAL DATA:   Tachycardia. EXAM: CHEST 1 VIEW COMPARISON:  02/29/2016 and CT from 02/29/2016 FINDINGS: Again noted is a large right pleural effusion with compressive atelectasis. Only a small portion of the right lung is aerated. Right pleural fluid has increased since the prior chest radiograph. Left lung remains clear. Cardiac silhouette is grossly stable in size but partially obscured by the right chest densities. Negative for pneumothorax. Patient is slightly rotated towards the right. IMPRESSION: Large right pleural effusion with compressive atelectasis. Only a small portion of the right lung is aerated. The right pleural fluid has slightly enlarged since 02/29/2016. Electronically Signed   By: Markus Daft M.D.   On: 03/05/2016 11:28   Ct Angio Chest Pe W Or Wo Contrast  Result Date: 03/05/2016 CLINICAL DATA:  Elevated D-dimer with shortness of breath. EXAM: CT ANGIOGRAPHY CHEST WITH CONTRAST TECHNIQUE: Multidetector CT imaging of the chest was performed using the standard protocol during bolus administration of intravenous contrast. Multiplanar CT image reconstructions and MIPs were obtained to evaluate the vascular anatomy. CONTRAST:  125 mL Isovue 370 COMPARISON:  Chest CT 02/29/2016 FINDINGS: Cardiovascular: No evidence for a pulmonary embolism. Normal caliber of the thoracic aorta and no evidence for aortic dissection. There is flow in the great vessels. Coronary artery calcifications. Mediastinum/Nodes: There is  no significant chest lymphadenopathy. However, there is low-density material along the right anterior mediastinum. This was present on the previous examination but not present on the exam from 02/18/2016. This may represent loculated fluid.Esophagus is unremarkable. No significant axillary lymphadenopathy. Lungs/Pleura: There is a massive right pleural effusion occupying most of the right chest. There is complete collapse of the right middle lobe and right lower lobe. Small portion of the right upper lobe  is still aerated. Left lung is clear. There is mild atelectasis at the left lung base. Upper Abdomen: No acute abnormality. Musculoskeletal: No acute bone abnormality. Review of the MIP images confirms the above findings. IMPRESSION: No evidence for pulmonary embolism. No evidence for aortic dissection. Large right pleural effusion causing collapse of the right middle lobe and right lower lobe. Only a small portion of the right upper lobe is aerated. In addition, there appears to be loculated fluid extending into the right side of the mediastinum. Electronically Signed   By: Markus Daft M.D.   On: 03/05/2016 14:42    Review of Systems  Constitutional: Positive for malaise/fatigue. Negative for chills, fever and weight loss.  HENT: Negative.   Eyes: Negative.   Respiratory: Positive for cough, hemoptysis, sputum production and shortness of breath.   Cardiovascular: Positive for leg swelling.  Gastrointestinal: Negative.   Genitourinary: Negative.   Musculoskeletal: Positive for back pain.  Skin: Negative.   Neurological:       Severe peripheral neuropathy in legs. Walks a little with walker  Endo/Heme/Allergies: Negative.   Psychiatric/Behavioral: Positive for memory loss.       And confusion at times    Blood pressure 103/63, pulse (!) 103, temperature 97.2 F (36.2 C), temperature source Oral, resp. rate 20, height _0  (1.803 m), weight 135.7 kg (299 lb 2.6 oz), SpO2 96 %. Physical Exam  Constitutional:  Lethargic and chronically ill-appearing  Cardiovascular: Normal rate, regular rhythm and normal heart sounds.   No murmur heard. Respiratory: Effort normal. No respiratory distress.  Decreased breath sounds on the right  GI: Soft. Bowel sounds are normal. He exhibits no distension. There is no tenderness.  Musculoskeletal: He exhibits edema.  Dressing on lower legs over ulcers. Chronic brownish discoloration of lower legs  Neurological:  Lethargic.    CLINICAL DATA:  Elevated  D-dimer with shortness of breath.  EXAM: CT ANGIOGRAPHY CHEST WITH CONTRAST  TECHNIQUE: Multidetector CT imaging of the chest was performed using the standard protocol during bolus administration of intravenous contrast. Multiplanar CT image reconstructions and MIPs were obtained to evaluate the vascular anatomy.  CONTRAST:  125 mL Isovue 370  COMPARISON:  Chest CT 02/29/2016  FINDINGS: Cardiovascular: No evidence for a pulmonary embolism. Normal caliber of the thoracic aorta and no evidence for aortic dissection. There is flow in the great vessels. Coronary artery calcifications.  Mediastinum/Nodes: There is no significant chest lymphadenopathy. However, there is low-density material along the right anterior mediastinum. This was present on the previous examination but not present on the exam from 02/18/2016. This may represent loculated fluid.Esophagus is unremarkable. No significant axillary lymphadenopathy.  Lungs/Pleura: There is a massive right pleural effusion occupying most of the right chest. There is complete collapse of the right middle lobe and right lower lobe. Small portion of the right upper lobe is still aerated. Left lung is clear. There is mild atelectasis at the left lung base.  Upper Abdomen: No acute abnormality.  Musculoskeletal: No acute bone abnormality.  Review of the MIP images confirms the above  findings.  IMPRESSION: No evidence for pulmonary embolism. No evidence for aortic dissection.  Large right pleural effusion causing collapse of the right middle lobe and right lower lobe. Only a small portion of the right upper lobe is aerated. In addition, there appears to be loculated fluid extending into the right side of the mediastinum.   Electronically Signed   By: Markus Daft M.D.   On: 03/05/2016 14:42   Assessment/Plan:  This 75 year old in general poor health presented with a several week history of scant brownish-black  hemoptysis and RLL cavitary pneumonia on CT. Despite treatment he has developed a large complex right pleural effusion with near complete collapse of the right lung. I think the only option for trying to get him better is to drain the effusion in the OR with VATS or thoracotomy if needed. This will allow expansion of the right lung and hopefully resolution of the pneumonia. He was apparently marginal at home before all this started and has been in the hospital for the past three weeks so I think he will have a very hard time recovering and is at high risk for complications including infection, respiratory failure, other organ dysfunction and death. I discussed the operative procedure with the patient's wife and friend including alternatives, benefits and risks; including but not limited to bleeding, blood transfusion, infection, stroke, myocardial infarction, organ dysfunction, and death.  She understands and agrees to proceed.  We will schedule surgery for tomorrow morning.  Fernande Boyden Emmylou Bieker 03/06/2016, 2:22 PM

## 2016-03-06 NOTE — Progress Notes (Signed)
Patient c/o lower ABD pressure, bladder scan revealed 562mL, bed wet x 1 very little at this time. MD on call (Dr.Kim) notified and order given to insert foley catheter for acute urinary retention. Foley inserted and 448mL golden yellow urine noted in drainage bag. Patient reported feeling some relief.

## 2016-03-06 NOTE — Progress Notes (Signed)
ECHO DONE WITH DEFINITY.

## 2016-03-07 ENCOUNTER — Inpatient Hospital Stay (HOSPITAL_COMMUNITY): Payer: Medicare Other | Admitting: Anesthesiology

## 2016-03-07 ENCOUNTER — Inpatient Hospital Stay (HOSPITAL_COMMUNITY): Payer: Medicare Other

## 2016-03-07 ENCOUNTER — Encounter (HOSPITAL_COMMUNITY)
Admission: EM | Disposition: A | Discharge status: Skilled Nursing Facility | Payer: Self-pay | Source: Home / Self Care | Attending: Internal Medicine

## 2016-03-07 ENCOUNTER — Encounter (HOSPITAL_COMMUNITY): Payer: Self-pay | Admitting: Certified Registered Nurse Anesthetist

## 2016-03-07 DIAGNOSIS — J869 Pyothorax without fistula: Secondary | ICD-10-CM | POA: Diagnosis present

## 2016-03-07 HISTORY — PX: VIDEO ASSISTED THORACOSCOPY (VATS)/THOROCOTOMY: SHX6173

## 2016-03-07 HISTORY — PX: FLEXIBLE BRONCHOSCOPY: SHX5094

## 2016-03-07 LAB — CBC WITH DIFFERENTIAL/PLATELET
BASOS PCT: 0 %
Basophils Absolute: 0 10*3/uL (ref 0.0–0.1)
EOS PCT: 4 %
Eosinophils Absolute: 0.4 10*3/uL (ref 0.0–0.7)
HEMATOCRIT: 26.7 % — AB (ref 39.0–52.0)
Hemoglobin: 8.2 g/dL — ABNORMAL LOW (ref 13.0–17.0)
Lymphocytes Relative: 9 %
Lymphs Abs: 0.9 10*3/uL (ref 0.7–4.0)
MCH: 28.9 pg (ref 26.0–34.0)
MCHC: 30.7 g/dL (ref 30.0–36.0)
MCV: 94 fL (ref 78.0–100.0)
MONO ABS: 0.8 10*3/uL (ref 0.1–1.0)
MONOS PCT: 8 %
NEUTROS ABS: 8.1 10*3/uL — AB (ref 1.7–7.7)
Neutrophils Relative %: 79 %
PLATELETS: 322 10*3/uL (ref 150–400)
RBC: 2.84 MIL/uL — ABNORMAL LOW (ref 4.22–5.81)
RDW: 16.2 % — AB (ref 11.5–15.5)
WBC: 10.2 10*3/uL (ref 4.0–10.5)

## 2016-03-07 LAB — POCT I-STAT 3, ART BLOOD GAS (G3+)
ACID-BASE EXCESS: 1 mmol/L (ref 0.0–2.0)
ACID-BASE EXCESS: 1 mmol/L (ref 0.0–2.0)
BICARBONATE: 25.7 mmol/L (ref 20.0–28.0)
Bicarbonate: 25.9 mmol/L (ref 20.0–28.0)
O2 SAT: 87 %
O2 SAT: 95 %
PCO2 ART: 38.6 mmHg (ref 32.0–48.0)
PH ART: 7.43 (ref 7.350–7.450)
PO2 ART: 49 mmHg — AB (ref 83.0–108.0)
Patient temperature: 97.4
TCO2: 27 mmol/L (ref 0–100)
TCO2: 27 mmol/L (ref 0–100)
pCO2 arterial: 38.6 mmHg (ref 32.0–48.0)
pH, Arterial: 7.431 (ref 7.350–7.450)
pO2, Arterial: 73 mmHg — ABNORMAL LOW (ref 83.0–108.0)

## 2016-03-07 LAB — COMPREHENSIVE METABOLIC PANEL
ALBUMIN: 1.8 g/dL — AB (ref 3.5–5.0)
ALT: 18 U/L (ref 17–63)
ANION GAP: 6 (ref 5–15)
AST: 18 U/L (ref 15–41)
Alkaline Phosphatase: 69 U/L (ref 38–126)
BILIRUBIN TOTAL: 0.8 mg/dL (ref 0.3–1.2)
BUN: 9 mg/dL (ref 6–20)
CHLORIDE: 101 mmol/L (ref 101–111)
CO2: 29 mmol/L (ref 22–32)
Calcium: 7.7 mg/dL — ABNORMAL LOW (ref 8.9–10.3)
Creatinine, Ser: 0.65 mg/dL (ref 0.61–1.24)
GFR calc Af Amer: >60 mL/min (ref >60)
Glucose, Bld: 162 mg/dL — ABNORMAL HIGH (ref 65–99)
POTASSIUM: 4 mmol/L (ref 3.5–5.1)
Sodium: 136 mmol/L (ref 135–145)
TOTAL PROTEIN: 5 g/dL — AB (ref 6.5–8.1)

## 2016-03-07 LAB — PREPARE RBC (CROSSMATCH)

## 2016-03-07 LAB — BLOOD GAS, ARTERIAL
Acid-Base Excess: 1 mmol/L (ref 0.0–2.0)
BICARBONATE: 24.7 mmol/L (ref 20.0–28.0)
Drawn by: 244861
FIO2: 50
MECHVT: 600 mL
O2 Saturation: 88.8 %
PATIENT TEMPERATURE: 98.6
PEEP/CPAP: 5 cmH2O
PO2 ART: 54.7 mmHg — AB (ref 83.0–108.0)
RATE: 12 resp/min
pCO2 arterial: 36.9 mmHg (ref 32.0–48.0)
pH, Arterial: 7.442 (ref 7.350–7.450)

## 2016-03-07 LAB — GLUCOSE, CAPILLARY
GLUCOSE-CAPILLARY: 156 mg/dL — AB (ref 65–99)
Glucose-Capillary: 173 mg/dL — ABNORMAL HIGH (ref 65–99)
Glucose-Capillary: 147 mg/dL — ABNORMAL HIGH (ref 65–99)

## 2016-03-07 LAB — GRAM STAIN

## 2016-03-07 LAB — SURGICAL PCR SCREEN
MRSA, PCR: POSITIVE — AB
Staphylococcus aureus: POSITIVE — AB

## 2016-03-07 SURGERY — BRONCHOSCOPY, FLEXIBLE
Anesthesia: General | Site: Chest | Laterality: Right

## 2016-03-07 MED ORDER — SODIUM CHLORIDE 0.9 % IV SOLN
INTRAVENOUS | Status: DC
  Administered 2016-03-08 – 2016-03-10 (×4): via INTRAVENOUS

## 2016-03-07 MED ORDER — SUGAMMADEX SODIUM 200 MG/2ML IV SOLN
INTRAVENOUS | Status: AC
  Filled 2016-03-07: qty 2

## 2016-03-07 MED ORDER — SUGAMMADEX SODIUM 500 MG/5ML IV SOLN
INTRAVENOUS | Status: AC
  Filled 2016-03-07: qty 5

## 2016-03-07 MED ORDER — FENTANYL CITRATE (PF) 100 MCG/2ML IJ SOLN
50.0000 ug | INTRAMUSCULAR | Status: DC | PRN
  Administered 2016-03-07: 50 ug via INTRAVENOUS
  Filled 2016-03-07 (×2): qty 2

## 2016-03-07 MED ORDER — DEXAMETHASONE SODIUM PHOSPHATE 10 MG/ML IJ SOLN
INTRAMUSCULAR | Status: AC
  Filled 2016-03-07: qty 1

## 2016-03-07 MED ORDER — ONDANSETRON HCL 4 MG/2ML IJ SOLN: 4.0000 mg | Freq: Four times a day (QID) | INTRAMUSCULAR | Status: DC | PRN

## 2016-03-07 MED ORDER — PHENYLEPHRINE HCL 10 MG/ML IJ SOLN
INTRAMUSCULAR | Status: DC | PRN
  Administered 2016-03-07: 40 ug/min via INTRAVENOUS

## 2016-03-07 MED ORDER — CHLORHEXIDINE GLUCONATE CLOTH 2 % EX PADS
6.0000 | MEDICATED_PAD | Freq: Every day | CUTANEOUS | Status: DC
  Administered 2016-03-08 – 2016-03-11 (×4): 6 via TOPICAL

## 2016-03-07 MED ORDER — FENTANYL CITRATE (PF) 100 MCG/2ML IJ SOLN
INTRAMUSCULAR | Status: DC | PRN
  Administered 2016-03-07 (×2): 100 ug via INTRAVENOUS
  Administered 2016-03-07: 25 ug via INTRAVENOUS
  Administered 2016-03-07 (×2): 50 ug via INTRAVENOUS
  Administered 2016-03-07: 25 ug via INTRAVENOUS

## 2016-03-07 MED ORDER — INSULIN ASPART 100 UNIT/ML ~~LOC~~ SOLN
0.0000 [IU] | SUBCUTANEOUS | Status: DC
  Administered 2016-03-07: 8 [IU] via SUBCUTANEOUS
  Administered 2016-03-07: 2 [IU] via SUBCUTANEOUS
  Administered 2016-03-07: 4 [IU] via SUBCUTANEOUS
  Administered 2016-03-08: 8 [IU] via SUBCUTANEOUS
  Administered 2016-03-08 (×2): 4 [IU] via SUBCUTANEOUS
  Administered 2016-03-08: 2 [IU] via SUBCUTANEOUS
  Administered 2016-03-08: 8 [IU] via SUBCUTANEOUS
  Administered 2016-03-09 – 2016-03-11 (×9): 2 [IU] via SUBCUTANEOUS

## 2016-03-07 MED ORDER — TRAMADOL HCL 50 MG PO TABS
50.0000 mg | ORAL_TABLET | Freq: Four times a day (QID) | ORAL | Status: DC | PRN
  Administered 2016-03-12 – 2016-03-15 (×3): 100 mg via ORAL
  Filled 2016-03-07 (×3): qty 2

## 2016-03-07 MED ORDER — DEXMEDETOMIDINE HCL IN NACL 200 MCG/50ML IV SOLN: 0.4000 ug/kg/h | INTRAVENOUS | Status: DC

## 2016-03-07 MED ORDER — FENTANYL CITRATE (PF) 100 MCG/2ML IJ SOLN: 50.0000 ug | INTRAMUSCULAR | Status: DC | PRN

## 2016-03-07 MED ORDER — INSULIN ASPART 100 UNIT/ML ~~LOC~~ SOLN: 0.0000 [IU] | SUBCUTANEOUS | Status: DC

## 2016-03-07 MED ORDER — 0.9 % SODIUM CHLORIDE (POUR BTL) OPTIME
TOPICAL | Status: DC | PRN
  Administered 2016-03-07: 2000 mL

## 2016-03-07 MED ORDER — LACTATED RINGERS IV SOLN
INTRAVENOUS | Status: DC | PRN
  Administered 2016-03-07: 07:00:00 via INTRAVENOUS

## 2016-03-07 MED ORDER — MIDAZOLAM HCL 2 MG/2ML IJ SOLN
INTRAMUSCULAR | Status: AC
  Filled 2016-03-07: qty 2

## 2016-03-07 MED ORDER — FENTANYL CITRATE (PF) 100 MCG/2ML IJ SOLN
25.0000 ug | INTRAMUSCULAR | Status: DC | PRN
  Administered 2016-03-08 – 2016-03-13 (×5): 50 ug via INTRAVENOUS
  Filled 2016-03-07 (×4): qty 2

## 2016-03-07 MED ORDER — PHENYLEPHRINE HCL 10 MG/ML IJ SOLN: 0.0000 ug/min | INTRAVENOUS | Status: DC

## 2016-03-07 MED ORDER — BISACODYL 5 MG PO TBEC
10.0000 mg | DELAYED_RELEASE_TABLET | Freq: Every day | ORAL | Status: DC
  Administered 2016-03-09 – 2016-03-15 (×5): 10 mg via ORAL
  Filled 2016-03-07 (×5): qty 2

## 2016-03-07 MED ORDER — LEVALBUTEROL HCL 0.63 MG/3ML IN NEBU: 0.6300 mg | INHALATION_SOLUTION | Freq: Four times a day (QID) | RESPIRATORY_TRACT | Status: DC

## 2016-03-07 MED ORDER — FLUCONAZOLE IN SODIUM CHLORIDE 200-0.9 MG/100ML-% IV SOLN
200.0000 mg | Freq: Once | INTRAVENOUS | Status: AC
  Administered 2016-03-07: 200 mg via INTRAVENOUS
  Filled 2016-03-07: qty 100

## 2016-03-07 MED ORDER — CHLORHEXIDINE GLUCONATE 0.12% ORAL RINSE (MEDLINE KIT)
15.0000 mL | Freq: Two times a day (BID) | OROMUCOSAL | Status: DC
Start: 2016-03-07 — End: 2016-03-15
  Administered 2016-03-07 – 2016-03-15 (×13): 15 mL via OROMUCOSAL

## 2016-03-07 MED ORDER — MUPIROCIN 2 % EX OINT
1.0000 "application " | TOPICAL_OINTMENT | Freq: Two times a day (BID) | CUTANEOUS | Status: AC
  Administered 2016-03-07 – 2016-03-11 (×10): 1 via NASAL
  Filled 2016-03-07 (×4): qty 22

## 2016-03-07 MED ORDER — PHENYLEPHRINE HCL 10 MG/ML IJ SOLN
INTRAMUSCULAR | Status: AC
  Filled 2016-03-07: qty 1

## 2016-03-07 MED ORDER — PHENYLEPHRINE HCL 10 MG/ML IJ SOLN
0.0000 ug/min | INTRAVENOUS | Status: DC
  Filled 2016-03-07: qty 1

## 2016-03-07 MED ORDER — POTASSIUM CHLORIDE 10 MEQ/50ML IV SOLN
10.0000 meq | Freq: Every day | INTRAVENOUS | Status: DC | PRN
  Administered 2016-03-11 (×3): 10 meq via INTRAVENOUS
  Filled 2016-03-07 (×4): qty 50

## 2016-03-07 MED ORDER — DEXMEDETOMIDINE HCL IN NACL 200 MCG/50ML IV SOLN
0.4000 ug/kg/h | INTRAVENOUS | Status: DC
  Administered 2016-03-07 – 2016-03-08 (×5): 0.5 ug/kg/h via INTRAVENOUS
  Filled 2016-03-07 (×5): qty 50

## 2016-03-07 MED ORDER — ALBUMIN HUMAN 5 % IV SOLN
INTRAVENOUS | Status: DC | PRN
  Administered 2016-03-07: 11:00:00 via INTRAVENOUS

## 2016-03-07 MED ORDER — FENTANYL CITRATE (PF) 250 MCG/5ML IJ SOLN
INTRAMUSCULAR | Status: AC
  Filled 2016-03-07: qty 5

## 2016-03-07 MED ORDER — IPRATROPIUM-ALBUTEROL 0.5-2.5 (3) MG/3ML IN SOLN
3.0000 mL | Freq: Four times a day (QID) | RESPIRATORY_TRACT | Status: DC
  Administered 2016-03-07 – 2016-03-08 (×3): 3 mL via RESPIRATORY_TRACT
  Filled 2016-03-07 (×3): qty 3

## 2016-03-07 MED ORDER — ATORVASTATIN CALCIUM 10 MG PO TABS
10.0000 mg | ORAL_TABLET | Freq: Every day | ORAL | Status: DC
  Administered 2016-03-08 – 2016-03-10 (×3): 10 mg
  Filled 2016-03-07 (×2): qty 1

## 2016-03-07 MED ORDER — OXYCODONE HCL 5 MG PO TABS
5.0000 mg | ORAL_TABLET | ORAL | Status: DC | PRN
  Administered 2016-03-09 – 2016-03-10 (×2): 10 mg via ORAL
  Filled 2016-03-07 (×2): qty 2

## 2016-03-07 MED ORDER — PHENYLEPHRINE HCL 10 MG/ML IJ SOLN
INTRAMUSCULAR | Status: DC | PRN
  Administered 2016-03-07: 120 ug via INTRAVENOUS
  Administered 2016-03-07 (×4): 80 ug via INTRAVENOUS

## 2016-03-07 MED ORDER — ACETAMINOPHEN 160 MG/5ML PO SOLN
1000.0000 mg | Freq: Four times a day (QID) | ORAL | Status: AC
  Administered 2016-03-08 (×2): 1000 mg via ORAL
  Filled 2016-03-07 (×2): qty 40.6

## 2016-03-07 MED ORDER — PHENYLEPHRINE 40 MCG/ML (10ML) SYRINGE FOR IV PUSH (FOR BLOOD PRESSURE SUPPORT)
PREFILLED_SYRINGE | INTRAVENOUS | Status: AC
  Filled 2016-03-07: qty 20

## 2016-03-07 MED ORDER — ONDANSETRON HCL 4 MG/2ML IJ SOLN
INTRAMUSCULAR | Status: AC
  Filled 2016-03-07: qty 2

## 2016-03-07 MED ORDER — ROCURONIUM BROMIDE 10 MG/ML (PF) SYRINGE
PREFILLED_SYRINGE | INTRAVENOUS | Status: AC
  Filled 2016-03-07: qty 10

## 2016-03-07 MED ORDER — PHENYLEPHRINE HCL 10 MG/ML IJ SOLN
0.0000 ug/min | INTRAVENOUS | Status: DC
  Administered 2016-03-07: 30 ug/min via INTRAVENOUS

## 2016-03-07 MED ORDER — SUCCINYLCHOLINE CHLORIDE 200 MG/10ML IV SOSY
PREFILLED_SYRINGE | INTRAVENOUS | Status: AC
  Filled 2016-03-07: qty 10

## 2016-03-07 MED ORDER — PROPOFOL 10 MG/ML IV BOLUS
INTRAVENOUS | Status: DC | PRN
  Administered 2016-03-07: 100 mg via INTRAVENOUS
  Administered 2016-03-07: 30 mg via INTRAVENOUS

## 2016-03-07 MED ORDER — METRONIDAZOLE IN NACL 5-0.79 MG/ML-% IV SOLN
500.0000 mg | Freq: Three times a day (TID) | INTRAVENOUS | Status: DC
  Administered 2016-03-07 – 2016-03-09 (×6): 500 mg via INTRAVENOUS
  Filled 2016-03-07 (×6): qty 100

## 2016-03-07 MED ORDER — SENNOSIDES-DOCUSATE SODIUM 8.6-50 MG PO TABS
1.0000 | ORAL_TABLET | Freq: Every day | ORAL | Status: DC
  Administered 2016-03-10 – 2016-03-14 (×4): 1 via ORAL
  Filled 2016-03-07 (×4): qty 1

## 2016-03-07 MED ORDER — SUGAMMADEX SODIUM 200 MG/2ML IV SOLN
INTRAVENOUS | Status: DC | PRN
  Administered 2016-03-07: 700 mg via INTRAVENOUS

## 2016-03-07 MED ORDER — INSULIN ASPART 100 UNIT/ML ~~LOC~~ SOLN
SUBCUTANEOUS | Status: AC
  Filled 2016-03-07: qty 1

## 2016-03-07 MED ORDER — HYDROMORPHONE HCL 1 MG/ML IJ SOLN: 0.2500 mg | INTRAMUSCULAR | Status: DC | PRN

## 2016-03-07 MED ORDER — DEXMEDETOMIDINE HCL IN NACL 400 MCG/100ML IV SOLN
0.4000 ug/kg/h | INTRAVENOUS | Status: AC
  Administered 2016-03-07: 0.7 ug/h via INTRAVENOUS
  Filled 2016-03-07 (×2): qty 100

## 2016-03-07 MED ORDER — ACETAMINOPHEN 500 MG PO TABS
1000.0000 mg | ORAL_TABLET | Freq: Four times a day (QID) | ORAL | Status: AC
  Administered 2016-03-08 – 2016-03-12 (×12): 1000 mg via ORAL
  Filled 2016-03-07 (×12): qty 2

## 2016-03-07 MED ORDER — ROCURONIUM BROMIDE 100 MG/10ML IV SOLN
INTRAVENOUS | Status: DC | PRN
  Administered 2016-03-07: 20 mg via INTRAVENOUS
  Administered 2016-03-07: 50 mg via INTRAVENOUS
  Administered 2016-03-07: 30 mg via INTRAVENOUS

## 2016-03-07 MED ORDER — SODIUM CHLORIDE 0.9 % IV SOLN: Freq: Once | INTRAVENOUS | Status: DC

## 2016-03-07 MED ORDER — LACTATED RINGERS IV SOLN
INTRAVENOUS | Status: DC | PRN
  Administered 2016-03-07: 08:00:00 via INTRAVENOUS

## 2016-03-07 MED ORDER — ONDANSETRON HCL 4 MG/2ML IJ SOLN
INTRAMUSCULAR | Status: DC | PRN
  Administered 2016-03-07: 4 mg via INTRAVENOUS

## 2016-03-07 MED ORDER — PHENYLEPHRINE 40 MCG/ML (10ML) SYRINGE FOR IV PUSH (FOR BLOOD PRESSURE SUPPORT)
PREFILLED_SYRINGE | INTRAVENOUS | Status: AC
  Filled 2016-03-07: qty 10

## 2016-03-07 MED ORDER — ORAL CARE MOUTH RINSE
15.0000 mL | Freq: Four times a day (QID) | OROMUCOSAL | Status: DC
  Administered 2016-03-07 – 2016-03-15 (×23): 15 mL via OROMUCOSAL

## 2016-03-07 MED ORDER — DEXMEDETOMIDINE HCL IN NACL 400 MCG/100ML IV SOLN
0.4000 ug/kg/h | INTRAVENOUS | Status: DC
  Administered 2016-03-07: 0.722 ug/kg/h via INTRAVENOUS

## 2016-03-07 MED ORDER — DEXTROSE 5 % IV SOLN
0.0000 ug/min | INTRAVENOUS | Status: DC
  Administered 2016-03-07: 35 ug/min via INTRAVENOUS
  Administered 2016-03-08: 16 ug/min via INTRAVENOUS
  Administered 2016-03-08: 20 ug/min via INTRAVENOUS
  Filled 2016-03-07 (×3): qty 4

## 2016-03-07 MED ORDER — MIDAZOLAM HCL 5 MG/5ML IJ SOLN
INTRAMUSCULAR | Status: DC | PRN
  Administered 2016-03-07 (×2): 1 mg via INTRAVENOUS

## 2016-03-07 MED ORDER — PROPOFOL 10 MG/ML IV BOLUS
INTRAVENOUS | Status: AC
  Filled 2016-03-07: qty 40

## 2016-03-07 SURGICAL SUPPLY — 79 items
APPLICATOR TIP EXT COSEAL (VASCULAR PRODUCTS) IMPLANT
BRUSH CYTOL CELLEBRITY 1.5X140 (MISCELLANEOUS) IMPLANT
CANISTER SUCTION 2500CC (MISCELLANEOUS) ×8 IMPLANT
CATH KIT ON Q 5IN SLV (PAIN MANAGEMENT) IMPLANT
CATH THORACIC 28FR (CATHETERS) ×4 IMPLANT
CATH THORACIC 28FR RT ANG (CATHETERS) ×4 IMPLANT
CATH THORACIC 36FR (CATHETERS) IMPLANT
CATH THORACIC 36FR RT ANG (CATHETERS) IMPLANT
CLIP TI MEDIUM 6 (CLIP) IMPLANT
CONN ST 1/4X3/8  BEN (MISCELLANEOUS) ×2
CONN ST 1/4X3/8 BEN (MISCELLANEOUS) ×2 IMPLANT
CONT SPEC 4OZ CLIKSEAL STRL BL (MISCELLANEOUS) ×12 IMPLANT
COTTONBALL LRG STERILE PKG (GAUZE/BANDAGES/DRESSINGS) IMPLANT
COVER SURGICAL LIGHT HANDLE (MISCELLANEOUS) IMPLANT
COVER TABLE BACK 60X90 (DRAPES) IMPLANT
DERMABOND ADVANCED (GAUZE/BANDAGES/DRESSINGS)
DERMABOND ADVANCED .7 DNX12 (GAUZE/BANDAGES/DRESSINGS) IMPLANT
DRAPE HALF SHEET 40X57 (DRAPES) ×4 IMPLANT
DRAPE LAPAROSCOPIC ABDOMINAL (DRAPES) ×4 IMPLANT
DRAPE SLUSH/WARMER DISC (DRAPES) ×4 IMPLANT
DRAPE WARM FLUID 44X44 (DRAPE) IMPLANT
ELECT BLADE 6.5 EXT (BLADE) ×4 IMPLANT
ELECT REM PT RETURN 9FT ADLT (ELECTROSURGICAL) ×4
ELECTRODE REM PT RTRN 9FT ADLT (ELECTROSURGICAL) ×2 IMPLANT
FORCEPS BIOP RJ4 1.8 (CUTTING FORCEPS) IMPLANT
GAUZE SPONGE 4X4 12PLY STRL (GAUZE/BANDAGES/DRESSINGS) ×4 IMPLANT
GLOVE BIOGEL PI IND STRL 6 (GLOVE) ×2 IMPLANT
GLOVE BIOGEL PI INDICATOR 6 (GLOVE) ×2
GLOVE EUDERMIC 7 POWDERFREE (GLOVE) ×8 IMPLANT
GOWN STRL REUS W/ TWL LRG LVL3 (GOWN DISPOSABLE) ×6 IMPLANT
GOWN STRL REUS W/ TWL XL LVL3 (GOWN DISPOSABLE) ×2 IMPLANT
GOWN STRL REUS W/TWL LRG LVL3 (GOWN DISPOSABLE) ×6
GOWN STRL REUS W/TWL XL LVL3 (GOWN DISPOSABLE) ×2
HEMOSTAT SURGICEL 2X14 (HEMOSTASIS) IMPLANT
KIT BASIN OR (CUSTOM PROCEDURE TRAY) ×4 IMPLANT
KIT ROOM TURNOVER OR (KITS) ×4 IMPLANT
KIT SUCTION CATH 14FR (SUCTIONS) ×4 IMPLANT
MARKER SKIN DUAL TIP RULER LAB (MISCELLANEOUS) IMPLANT
NEEDLE 22X1 1/2 (OR ONLY) (NEEDLE) IMPLANT
NEEDLE BIOPSY TRANSBRONCH 21G (NEEDLE) IMPLANT
NS IRRIG 1000ML POUR BTL (IV SOLUTION) ×12 IMPLANT
OIL SILICONE PENTAX (PARTS (SERVICE/REPAIRS)) ×4 IMPLANT
PACK CHEST (CUSTOM PROCEDURE TRAY) ×4 IMPLANT
PAD ARMBOARD 7.5X6 YLW CONV (MISCELLANEOUS) ×24 IMPLANT
SEALANT SURG COSEAL 4ML (VASCULAR PRODUCTS) IMPLANT
SEALANT SURG COSEAL 8ML (VASCULAR PRODUCTS) IMPLANT
SOLUTION ANTI FOG 6CC (MISCELLANEOUS) ×8 IMPLANT
SUT PROLENE 3 0 SH DA (SUTURE) IMPLANT
SUT PROLENE 4 0 RB 1 (SUTURE)
SUT PROLENE 4-0 RB1 .5 CRCL 36 (SUTURE) IMPLANT
SUT SILK  1 MH (SUTURE) ×4
SUT SILK 1 MH (SUTURE) ×4 IMPLANT
SUT SILK 1 TIES 10X30 (SUTURE) IMPLANT
SUT SILK 2 0 SH (SUTURE) IMPLANT
SUT SILK 2 0SH CR/8 30 (SUTURE) IMPLANT
SUT VIC AB 1 CTX 18 (SUTURE) IMPLANT
SUT VIC AB 1 CTX 36 (SUTURE)
SUT VIC AB 1 CTX36XBRD ANBCTR (SUTURE) IMPLANT
SUT VIC AB 2-0 CTX 36 (SUTURE) IMPLANT
SUT VIC AB 2-0 UR6 27 (SUTURE) ×4 IMPLANT
SUT VIC AB 3-0 MH 27 (SUTURE) IMPLANT
SUT VIC AB 3-0 X1 27 (SUTURE) ×4 IMPLANT
SUT VICRYL 2 TP 1 (SUTURE) IMPLANT
SWAB COLLECTION DEVICE MRSA (MISCELLANEOUS) IMPLANT
SYR 20ML ECCENTRIC (SYRINGE) IMPLANT
SYR 5ML LUER SLIP (SYRINGE) IMPLANT
SYR CONTROL 10ML LL (SYRINGE) IMPLANT
SYSTEM SAHARA CHEST DRAIN RE-I (WOUND CARE) ×4 IMPLANT
TAPE CLOTH 4X10 WHT NS (GAUZE/BANDAGES/DRESSINGS) ×4 IMPLANT
TAPE CLOTH SURG 4X10 WHT LF (GAUZE/BANDAGES/DRESSINGS) ×4 IMPLANT
TIP APPLICATOR SPRAY EXTEND 16 (VASCULAR PRODUCTS) IMPLANT
TOWEL OR 17X24 6PK STRL BLUE (TOWEL DISPOSABLE) ×4 IMPLANT
TOWEL OR 17X26 10 PK STRL BLUE (TOWEL DISPOSABLE) ×4 IMPLANT
TRAP SPECIMEN MUCOUS 40CC (MISCELLANEOUS) ×12 IMPLANT
TRAY FOLEY CATH 14FRSI W/METER (CATHETERS) IMPLANT
TUBE ANAEROBIC SPECIMEN COL (MISCELLANEOUS) IMPLANT
TUBE CONNECTING 12'X1/4 (SUCTIONS) ×1
TUBE CONNECTING 12X1/4 (SUCTIONS) ×3 IMPLANT
WATER STERILE IRR 1000ML POUR (IV SOLUTION) ×4 IMPLANT

## 2016-03-07 NOTE — Transfer of Care (Signed)
Immediate Anesthesia Transfer of Care Note  Patient: Richard Fields  Procedure(s) Performed: Procedure(s): FLEXIBLE VIDEO BRONCHOSCOPY (N/A) VIDEO ASSISTED THORACOSCOPY (VATS) (Right)  Patient Location: PACU  Anesthesia Type:General  Level of Consciousness: Patient remains intubated per anesthesia plan  Airway & Oxygen Therapy: Patient remains intubated per anesthesia plan and Patient placed on Ventilator (see vital sign flow sheet for setting)  Post-op Assessment: Report given to RN and Post -op Vital signs reviewed and stable  Post vital signs: Reviewed and stable  Last Vitals:  Vitals:   03/07/16 0600 03/07/16 1122  BP: 117/63 102/74  Pulse: 87 88  Resp: (!) 21 16  Temp:  36.4 C    Last Pain:  Vitals:   03/07/16 1122  TempSrc:   PainSc: 0-No pain      Patients Stated Pain Goal: 2 ([id number])  Complications: No apparent anesthesia complications

## 2016-03-07 NOTE — Progress Notes (Signed)
Pt stable remains on vent vs stable resting comfortably precidex remains at .7 mcg and neo drip at 40

## 2016-03-07 NOTE — Progress Notes (Signed)
03/07/2016 1630 ELink paged and made aware of bradycardia since initiation of precedex gtt in PACU. Orders received. Will continue to closely monitor patient.  Leovardo Thoman, [place] Lime

## 2016-03-07 NOTE — Progress Notes (Signed)
Dr fitzgerald at bedside orders obtained and carried out I stat done results given to dr fitzgerald [id number] covered with ss insulin

## 2016-03-07 NOTE — Progress Notes (Signed)
Pt arrived to pacu with ETT asssited ventilations with ambu bag by crna connected to ventilator by RT with settings of TV=600 IMV=14 PEEP=5 FIO2=50 O2 SATS=94

## 2016-03-07 NOTE — Progress Notes (Signed)
OT Cancellation Note  Patient Details Name: Richard Fields MRN: [id number] DOB: 30-Nov-1940   Cancelled Treatment:    Reason Eval/Treat Not Completed: Patient at procedure or test/ unavailable.  Pt currently undergoing bronchoscopy.  Will check back.  [place], OTR/L [id number]   Lucille Passy M 03/07/2016, 10:40 AM

## 2016-03-07 NOTE — Progress Notes (Signed)
03/07/2016 6:43 PM Received call from Dr. Titus Mould, order received to repeat ABG. Orders enacted. Results called to Dr. Oletta Darter with [facility]. Orders received and enacted. Will continue to closely monitor patient.  Garvey Richard Fields, Ar[place] Lime

## 2016-03-07 NOTE — Anesthesia Postprocedure Evaluation (Signed)
Anesthesia Post Note  Patient: Richard Fields  Procedure(s) Performed: Procedure(s) (LRB): FLEXIBLE VIDEO BRONCHOSCOPY (N/A) VIDEO ASSISTED THORACOSCOPY (VATS) (Right)  Patient location during evaluation: SICU Anesthesia Type: General Level of consciousness: sedated Pain management: pain level controlled Vital Signs Assessment: post-procedure vital signs reviewed and stable Respiratory status: patient remains intubated per anesthesia plan Cardiovascular status: stable Anesthetic complications: no    Last Vitals:  Vitals:   03/07/16 1334 03/07/16 1349  BP: (!) 123/50 102/66  Pulse: 64 64  Resp: 12 18  Temp:      Last Pain:  Vitals:   03/07/16 1145  TempSrc:   PainSc: 0-No pain                 Hiep Ollis,W. EDMOND

## 2016-03-07 NOTE — Anesthesia Procedure Notes (Addendum)
Central Venous Catheter Insertion Performed by: anesthesiologist Patient location: Pre-op. Preanesthetic checklist: patient identified, IV checked, site marked, risks and benefits discussed, surgical consent, monitors and equipment checked, pre-op evaluation, timeout performed and anesthesia consent Position: Trendelenburg Lidocaine 1% used for infiltration Landmarks identified and Seldinger technique used Catheter size: 8 Fr Central line was placed.Double lumen Procedure performed using ultrasound guided technique. Attempts: 1 Following insertion, dressing applied, line sutured and Biopatch. Post procedure assessment: blood return through all ports, free fluid flow and no air. Patient tolerated the procedure well with no immediate complications.

## 2016-03-07 NOTE — Progress Notes (Signed)
Dr fitzgerald at bedside ok to send to [facility] on vent critical care service contacted by dr fitzgerald to manage care in [facility]

## 2016-03-07 NOTE — Consult Note (Signed)
Name: Richard Fields MRN: [id number] DOB: May 25, 1940    ADMISSION DATE:  02/18/2016 CONSULTATION DATE:  03/06/16  REFERRING MD :  TRH  CHIEF COMPLAINT:  Dyspnea with pleural effusion  BRIEF PATIENT DESCRIPTION:  75 yo remote smoker with DM with severe peripheral neuropathy, hypertension and hyperlipidemia who was admitted to [facility] 02/18/2016 with RLL cavitary pneumonia. Patient is unreliable historian but family and friend deny known baseline lung disease. Thoracentesis 10/30- bloody fluid, culture neg. CT 110/30- large R effusion significantly compressing R lung.  SIGNIFICANT EVENTS  11/5 PCCM evaluated  11/6 right chest VATS  STUDIES:   Cultures Pleural fluid 11/6>>> Pleural tissue 11/6: no orgs>>>  abx Cefepime 11/5>>> vanc 11/5>>> Flagyl 11/5>>> Levaquin 10/31>>>11/5 Fluconazole 11/5>>>11/5  SUBJECTIVE:  Sedated on vent  VITAL SIGNS: Temp:  [97.5 F (36.4 C)-98.7 F (37.1 C)] 98.7 F (37.1 C) (11/06 1332) Pulse Rate:  [64-111] 64 (11/06 1349) Resp:  [11-28] 18 (11/06 1349) BP: (85-127)/(48-95) 102/66 (11/06 1349) SpO2:  [90 %-97 %] 95 % (11/06 1349) Arterial Line BP: (70-109)/(40-64) 93/40 (11/06 1349) FiO2 (%):  [50 %] 50 % (11/06 1137) Vent Mode: PRVC FiO2 (%):  [50 %] 50 % Set Rate:  [14 bmp] 14 bmp Vt Set:  [600 mL] 600 mL PEEP:  [5 cmH20] 5 cmH20 Plateau Pressure:  [20 cmH20-21 cmH20] 20 cmH20 . sodium chloride 45 mL/hr at 03/07/16 1135  . dexmedetomidine 0.722 mcg/kg/hr (03/07/16 1130)  . phenylephrine (NEO-SYNEPHRINE) Adult infusion 30 mcg/min (03/07/16 1135)    Intake/Output Summary (Last 24 hours) at 03/07/16 1429 Last data filed at 03/07/16 1332  Gross per 24 hour  Intake             3135 ml  Output             3590 ml  Net             -455 ml   PHYSICAL EXAMINATION: General:  Obese male, lying in bed post-op. Sedated Neuro: sedated currently  HEENT:  Orally intubated. No JVD. Right IJ CVL Cardiovascular:  RRR, no m/g/r Lungs:  equal chest rise. Decreased on right. Chest tube w/ serous bloody drainage. No airleak Abdomen:  Obese, non-tender, BS+ Musculoskeletal:  Normal muscle bulk Skin:  Small bruise R forearm  CBC Recent Labs     03/05/16  1101  03/06/16  0456  03/07/16  0539  WBC  14.3*  11.3*  10.2  HGB  9.9*  8.7*  8.2*  HCT  31.4*  27.7*  26.7*  PLT  350  327  322    Coag's Recent Labs     03/06/16  1345  APTT  33  INR  1.17    BMET Recent Labs     03/05/16  1101  03/06/16  0456  03/07/16  0539  NA  132*  134*  136  K  4.4  4.0  4.0  CL  97*  100*  101  CO2  29  30  29   BUN  13  11  9   CREATININE  0.78  0.55*  0.65  GLUCOSE  250*  162*  162*    Electrolytes Recent Labs     03/05/16  1101  03/06/16  0456  03/07/16  0539  CALCIUM  7.8*  7.7*  7.7*    Sepsis Markers No results for input(s): PROCALCITON, O2SATVEN in the last 72 hours.  Invalid input(s): LACTICACIDVEN  ABG Recent Labs     03/07/16  1255  PHART  7.442  PCO2ART  36.9  PO2ART  54.7*    Liver Enzymes Recent Labs     03/05/16  1101  03/06/16  0456  03/07/16  0539  AST  16  13*  18  ALT  19  15*  18  ALKPHOS  95  78  69  BILITOT  1.2  0.8  0.8  ALBUMIN  2.3*  1.9*  1.8*    Cardiac Enzymes Recent Labs     03/05/16  1101  03/05/16  1536  03/05/16  2250  TROPONINI  0.13*  0.11*  0.12*    Glucose Recent Labs     03/05/16  1652  03/06/16  0800  03/06/16  1142  03/06/16  1634  03/06/16  2147  03/07/16  0633  GLUCAP  222*  154*  152*  184*  147*  156*    Imaging Dg Chest Port 1 View  Result Date: 03/07/2016 CLINICAL DATA:  Postop VATS. EXAM: PORTABLE CHEST 1 VIEW COMPARISON:  03/05/2016 and CT 03/05/2016 FINDINGS: Patient is rotated to the right. Endotracheal tube has tip 4.3 cm above the carina. Right IJ central venous catheter has tip over the SVC. There is a left-sided PICC line unchanged with tip obliquely warranted at the junction of the left brachiocephalic vein to SVC. Right  upper and right basilar chest tubes in place. Significant interval improvement in the previously seen large right pleural effusion. Persistent hazy opacification over the right mid to lower lung likely residual fluid/ atelectasis. Cannot exclude small loculated pneumothorax over the right base. Stable cardiomegaly. Remainder of the exam is unchanged. IMPRESSION: Moderate interval improvement in right-sided pleural effusion with residual opacification in the right middle to lower lung likely residual fluid/atelectasis. Cannot completely exclude small loculated lateral right basilar pneumothorax. Tubes and lines as described. Electronically Signed   By: Marin Olp M.D.   On: 03/07/2016 12:11      CT images reviewed- agree large R effusion. Can't r/o underlying lesion  ASSESSMENT / PLAN:  Pulmonary: A: Cavitary Pneumonia w/ associated Large R pleural effusion- hydro/hemothorax.  ->now s/p Right VATS and flex bronch  Acute Hypoxic respiratory failure.  ->failed weaning attempt in PACU.  COPD Possible OSA/OHS P: Cont full vent support PAD goal 0 to -1 Re-assess for SBT when mental status allows F/u cytology from pleural fluid See ID section  Dc steroids Cont BDs May need to extubate to BIPAP  Cardiovascular  A: Hypotension. Likely medication related.  P: Assess CVP Cont neo for MAP goal >65 Cont IVFs  Renal A: No acute issues P: Trend I&O Avoid hypotension  F/u am labs  Infection: A: Complicated right pleural effusion/empyema  Cavitary PNA P Cont vanc and cefepime  Add flagyl Dc fluconazole   GI A:  Morbid obesity  P: PPI NPO Hold off from tube feeds for now  Neuro: A: Severe deconditioning  Sedation ->still w/ residual anesthesia on board P: PAD protocol  PT consult  Endocrine A: DM w/ hyperglycemia  P SSI   A: Anemia P: Trend CBC [place] heparin   Discussion: MO white male w/ sig h/o MO and DM. Admitted w/ cavitary PNA and complicated  pleural effusion. Now s/p VATS. Unable to extubate in PACU d/t hypoventilation and low Vts. Will cont full vent support, titrate sedating gtts per PAD protocol and cont empiric abx. Will add flagyl to current regimen. Given his body habitus we may need to have BIPAP after extubation.   Erick Colace  ACNP-BC [place] Pager # [phone] OR # [phone] if no answer   03/07/2016, 1:54 PM   STAFF NOTE: I, Merrie Roof, MD FACP have personally reviewed patient's available data, including medical history, events of note, physical examination and test results as part of my evaluation. I have discussed with resident/NP and other care providers such as pharmacist, RN and RRT. In addition, I personally evaluated patient and elicited key findings of:  Evaluated post op in pacu, ronchi bilateral anterior and bases, obese, edema, pcxr reviewed and prior CT, drained now post op VATS, hypoxia noted, agree needed to remain intubated, appears as empyema, will follow cultures, maintain cefepime / vanc, add flagyl for anerobic fo rnow, abg now on 8 cc/kg, may need peep 8-10 and recruitment, pcxr in am , move to my service if OKay by cvts, in am likley SBT cpap 5 ps5, goal 2 hours if peep reduced, no family noted The patient is critically ill with multiple organ systems failure and requires high complexity decision making for assessment and support, frequent evaluation and titration of therapies, application of advanced monitoring technologies and extensive interpretation of multiple databases.   Critical Care Time devoted to patient care services described in this note is 35 Minutes. This time reflects time of care of this signee: Merrie Roof, MD FACP. This critical care time does not reflect procedure time, or teaching time or supervisory time of PA/NP/Med student/Med Resident etc but could involve care discussion time. Rest per NP/medical resident whose note is outlined above and that I  agree with   Lavon Paganini. Titus Mould, MD, [place] Pgr: [facility] 03/07/2016 2:56 PM

## 2016-03-07 NOTE — Progress Notes (Signed)
[name] Patient Name: Richard Fields DOB: 07-Aug-1940 MRN: [id number]   Date of Service  03/07/2016  HPI/Events of Note  Hypoxia - ABG on 60%/PRVC 14/TV 600/P 5 = 7.42/39.8/51/26.  eICU Interventions  Will order: 1. Increase PEEP to 8. 2. Follow O2 sat by Oximetry.     Intervention Category Major Interventions: Hypoxemia - evaluation and management  Andrw Mcguirt Eugene 03/07/2016, 5:27 PM

## 2016-03-07 NOTE — Procedures (Deleted)
Arterial Catheter Insertion Procedure Note Richard Fields [id number] [birth year]  Procedure: Insertion of Arterial Catheter  Indications: Blood pressure monitoring and Frequent blood sampling  Procedure Details Consent: Unable to obtain consent because of patient on ventilator. Time Out: Verified patient identification, verified procedure, site/side was marked, verified correct patient position, special equipment/implants available, medications/allergies/relevent history reviewed, required imaging and test results available.  Performed  Maximum sterile technique was used including antiseptics, cap, gloves, gown, hand hygiene, mask and sheet. Skin prep: Chlorhexidine; local anesthetic administered 20 gauge catheter was inserted into left radial artery using the Seldinger technique.  Evaluation Blood flow good; BP tracing good. Complications: No apparent complications.   Richard Fields 03/07/2016

## 2016-03-07 NOTE — Brief Op Note (Signed)
02/18/2016 - 03/07/2016  10:30 AM  PATIENT:  Richard Fields  75 y.o. male  PRE-OPERATIVE DIAGNOSIS:  Pleural Effusion  POST-OPERATIVE DIAGNOSIS:  Pleural Effusion  PROCEDURE:  Procedure(s): FLEXIBLE VIDEO BRONCHOSCOPY (N/A) VIDEO ASSISTED THORACOSCOPY (VATS) (Right)  Drainage of empyema  SURGEON:  Surgeon(s) and Role:    * Gaye Pollack, MD - Primary  PHYSICIAN ASSISTANT: Jadene Pierini, PA-C  Findings: 2250 cc of serosanguinous fluid drained. RLL consolidation with abscess adherent to chest wall unroofed.   Bronchoscopy normal.  ANESTHESIA:   general  EBL:  Total I/O In: 1835 [I.V.:1500; Blood:335] Out: [id number] [Urine:150; Other:2200]  BLOOD ADMINISTERED:one unit  PRBC  DRAINS: 2 87F Chest Tube(s) in the right pleual space   LOCAL MEDICATIONS USED:  NONE  SPECIMEN:  Source of Specimen:  right pleural fluid for culture and cytology.  DISPOSITION OF SPECIMEN:  micro  COUNTS:  YES  TOURNIQUET:  * No tourniquets in log *  DICTATION: .Note written in [place]: Admit to inpatient   PATIENT DISPOSITION:  PACU - hemodynamically stable.   Delay start of Pharmacological VTE agent (>24hrs) due to surgical blood loss or risk of bleeding: yes

## 2016-03-07 NOTE — Anesthesia Procedure Notes (Signed)
Procedure Name: Intubation Date/Time: 03/07/2016 8:35 AM Performed by: Ollen Bowl Pre-anesthesia Checklist: Patient identified, Emergency Drugs available, Suction available, Patient being monitored and Timeout performed Oxygen Delivery Method: Circle system utilized and Simple face mask Preoxygenation: Pre-oxygenation with 100% oxygen Intubation Type: Inhalational induction with existing ETT Laryngoscope Size: Miller and 3 Grade View: Grade I Endobronchial tube: Left, Double lumen EBT, EBT position confirmed by auscultation and EBT position confirmed by fiberoptic bronchoscope and 41 Fr Number of attempts: 1 Airway Equipment and Method: Patient positioned with wedge pillow and Stylet Placement Confirmation: ETT inserted through vocal cords under direct vision,  positive ETCO2 and breath sounds checked- equal and bilateral Tube secured with: Tape Dental Injury: Teeth and Oropharynx as per pre-operative assessment

## 2016-03-07 NOTE — Progress Notes (Signed)
eLink Physician-Brief Progress Note Patient Name: Richard Fields DOB: [birth year] MRN: [id number]   Date of Service  03/07/2016  HPI/Events of Note  Hypotension and Bradycardia - BP = 99/48 with MAP = 60 and HR = 60. Currently on Precedex IV infusion for sedation.   eICU Interventions  Will order: 1. Monitor CVP.   If CVP < 10, will bolus with 0.9 NaCl 1 liter IV over 1 hour now. Attempt to wean Precedex to improve HR and BP. If CVP > 10, will need to change to another sedative agent such as Fentanyl or Versed.      Intervention Category Major Interventions: Hypotension - evaluation and management;Arrhythmia - evaluation and management  Herbert Aguinaldo Eugene 03/07/2016, 4:55 PM

## 2016-03-07 NOTE — Progress Notes (Signed)
03/07/2016 1700 ABG results called to [place]. Will await further orders.  Florabelle Cardin, Arville Lime

## 2016-03-07 NOTE — Progress Notes (Signed)
Patient ID: Richard Fields, male   DOB: Jul 27, 1940, 75 y.o.   MRN: [id number] EVENING ROUNDS NOTE :     [address]       [place] [address]             [phone]                 Day of Surgery Procedure(s) (LRB): FLEXIBLE VIDEO BRONCHOSCOPY (N/A) VIDEO ASSISTED THORACOSCOPY (VATS) (Right)  Total Length of Stay:  LOS: 18 days  BP (!) 132/59   Pulse 63   Temp 97.4 F (36.3 C) (Axillary)   Resp 14   Ht 5\' 11"  (1.803 m)   Wt 299 lb 2.6 oz (135.7 kg)   SpO2 96%   BMI 41.72 kg/m   .Intake/Output      11/05 0701 - 11/06 0700 11/06 0701 - 11/07 0700   I.V. (mL/kg)  2341 (17.3)   Blood  335   IV Piggyback 550 350   Total Intake(mL/kg) 550 (4.1) 3026 (22.3)   Urine (mL/kg/hr) 1325 (0.4) 365 (0.2)   Other  2200 (1.4)   Stool 0 (0)    Blood  100 (0.1)   Chest Tube  210 (0.1)   Total Output 1325 2875   Net -775 +151        Stool Occurrence 1 x      . sodium chloride 100 mL/hr at 03/07/16 1500  . dexmedetomidine 0.4 mcg/kg/hr (03/07/16 1700)  . phenylephrine (NEO-SYNEPHRINE) Adult infusion 45 mcg/min (03/07/16 1700)     Lab Results  Component Value Date   WBC 10.2 03/07/2016   HGB 8.2 (L) 03/07/2016   HCT 26.7 (L) 03/07/2016   PLT 322 03/07/2016   GLUCOSE 162 (H) 03/07/2016   ALT 18 03/07/2016   AST 18 03/07/2016   NA 136 03/07/2016   K 4.0 03/07/2016   CL 101 03/07/2016   CREATININE 0.65 03/07/2016   BUN 9 03/07/2016   CO2 29 03/07/2016   TSH 0.673 03/05/2016   INR 1.17 03/06/2016   HGBA1C 8.7 (H) 02/18/2016   Had to be reintubated in RR Now on vent , fio2 100 $% and peep of 8  sats 95 % CCM seeing patient and managing critical are /vent  210 from chest tube since surgery  Grace Isaac MD  Beeper [phone] Office [phone] 03/07/2016 6:31 PM

## 2016-03-07 NOTE — Anesthesia Preprocedure Evaluation (Addendum)
Anesthesia Evaluation  Patient identified by MRN, date of birth, ID band Patient awake    Reviewed: Allergy & Precautions, H&P , NPO status , Patient's Chart, lab work & pertinent test results  Airway Mallampati: II  TM Distance: >3 FB Neck ROM: Full    Dental no notable dental hx. (+) Partial Upper, Partial Lower, Dental Advisory Given   Pulmonary pneumonia, Current Smoker,     + decreased breath sounds      Cardiovascular hypertension, Pt. on medications  Rhythm:Regular Rate:Normal     Neuro/Psych negative neurological ROS  negative psych ROS   GI/Hepatic negative GI ROS, Neg liver ROS,   Endo/Other  diabetes, Type 2, Oral Hypoglycemic AgentsMorbid obesity  Renal/GU negative Renal ROS  negative genitourinary   Musculoskeletal   Abdominal   Peds  Hematology negative hematology ROS (+) anemia ,   Anesthesia Other Findings   Reproductive/Obstetrics negative OB ROS                            Anesthesia Physical Anesthesia Plan  ASA: III  Anesthesia Plan: General   Post-op Pain Management:    Induction: Intravenous  Airway Management Planned: Double Lumen EBT  Additional Equipment: Arterial line and CVP  Intra-op Plan:   Post-operative Plan: Extubation in OR and Possible Post-op intubation/ventilation  Informed Consent: I have reviewed the patients History and Physical, chart, labs and discussed the procedure including the risks, benefits and alternatives for the proposed anesthesia with the patient or authorized representative who has indicated his/her understanding and acceptance.   Dental advisory given  Plan Discussed with: CRNA  Anesthesia Plan Comments:         Anesthesia Quick Evaluation

## 2016-03-07 NOTE — Anesthesia Procedure Notes (Signed)
Procedure Name: Intubation Date/Time: 03/07/2016 10:52 AM Performed by: Ollen Bowl Pre-anesthesia Checklist: Patient identified, Emergency Drugs available, Suction available, Patient being monitored and Timeout performed Patient Re-evaluated:Patient Re-evaluated prior to inductionOxygen Delivery Method: Circle system utilized Preoxygenation: Pre-oxygenation with 100% oxygen Intubation Type: Inhalational induction with existing ETT Laryngoscope Size: Miller and 3 Grade View: Grade I Tube type: Subglottic suction tube Tube size: 8.0 mm Number of attempts: 1 Airway Equipment and Method: Patient positioned with wedge pillow and Stylet Placement Confirmation: ETT inserted through vocal cords under direct vision,  positive ETCO2 and breath sounds checked- equal and bilateral Secured at: 22 cm Tube secured with: Tape Dental Injury: Teeth and Oropharynx as per pre-operative assessment

## 2016-03-07 NOTE — Anesthesia Procedure Notes (Signed)
Procedure Name: Intubation Date/Time: 03/07/2016 8:18 AM Performed by: Ollen Bowl Pre-anesthesia Checklist: Patient identified, Emergency Drugs available, Suction available, Patient being monitored and Timeout performed Patient Re-evaluated:Patient Re-evaluated prior to inductionOxygen Delivery Method: Circle system utilized and Simple face mask Preoxygenation: Pre-oxygenation with 100% oxygen Intubation Type: IV induction Ventilation: Mask ventilation without difficulty Laryngoscope Size: Miller and 3 Grade View: Grade I Tube type: Oral Tube size: 9.0 mm Number of attempts: 1 Airway Equipment and Method: Patient positioned with wedge pillow and Stylet Placement Confirmation: ETT inserted through vocal cords under direct vision,  positive ETCO2 and breath sounds checked- equal and bilateral Secured at: 22 cm Tube secured with: Tape Dental Injury: Teeth and Oropharynx as per pre-operative assessment

## 2016-03-08 ENCOUNTER — Encounter (HOSPITAL_COMMUNITY): Payer: Self-pay | Admitting: Surgery

## 2016-03-08 ENCOUNTER — Inpatient Hospital Stay (HOSPITAL_COMMUNITY): Payer: Medicare Other

## 2016-03-08 LAB — POCT I-STAT 3, ART BLOOD GAS (G3+)
Acid-Base Excess: 2 mmol/L (ref 0.0–2.0)
Acid-Base Excess: 2 mmol/L (ref 0.0–2.0)
Bicarbonate: 27 mmol/L (ref 20.0–28.0)
Bicarbonate: 27.1 mmol/L (ref 20.0–28.0)
O2 SAT: 97 %
O2 Saturation: 100 %
PCO2 ART: 42.9 mmHg (ref 32.0–48.0)
PCO2 ART: 41.1 mmHg (ref 32.0–48.0)
PH ART: 7.408 (ref 7.350–7.450)
Patient temperature: 99.2
TCO2: 28 mmol/L (ref 0–100)
TCO2: 28 mmol/L (ref 0–100)
pH, Arterial: 7.426 (ref 7.350–7.450)
pO2, Arterial: 220 mmHg — ABNORMAL HIGH (ref 83.0–108.0)
pO2, Arterial: 88 mmHg (ref 83.0–108.0)

## 2016-03-08 LAB — COMPREHENSIVE METABOLIC PANEL
ALT: 16 U/L — AB (ref 17–63)
ANION GAP: 8 (ref 5–15)
AST: 16 U/L (ref 15–41)
Albumin: 1.8 g/dL — ABNORMAL LOW (ref 3.5–5.0)
Alkaline Phosphatase: 70 U/L (ref 38–126)
BUN: 8 mg/dL (ref 6–20)
CALCIUM: 7.6 mg/dL — AB (ref 8.9–10.3)
CHLORIDE: 103 mmol/L (ref 101–111)
CO2: 26 mmol/L (ref 22–32)
CREATININE: 0.63 mg/dL (ref 0.61–1.24)
Glucose, Bld: 181 mg/dL — ABNORMAL HIGH (ref 65–99)
Potassium: 4.1 mmol/L (ref 3.5–5.1)
Sodium: 137 mmol/L (ref 135–145)
Total Bilirubin: 0.9 mg/dL (ref 0.3–1.2)
Total Protein: 4.6 g/dL — ABNORMAL LOW (ref 6.5–8.1)

## 2016-03-08 LAB — GLUCOSE, CAPILLARY
GLUCOSE-CAPILLARY: 141 mg/dL — AB (ref 65–99)
Glucose-Capillary: 109 mg/dL — ABNORMAL HIGH (ref 65–99)
Glucose-Capillary: 116 mg/dL — ABNORMAL HIGH (ref 65–99)
Glucose-Capillary: 174 mg/dL — ABNORMAL HIGH (ref 65–99)
Glucose-Capillary: 175 mg/dL — ABNORMAL HIGH (ref 65–99)
Glucose-Capillary: 200 mg/dL — ABNORMAL HIGH (ref 65–99)
Glucose-Capillary: 205 mg/dL — ABNORMAL HIGH (ref 65–99)

## 2016-03-08 LAB — CBC
HCT: 27.3 % — ABNORMAL LOW (ref 39.0–52.0)
Hemoglobin: 8.6 g/dL — ABNORMAL LOW (ref 13.0–17.0)
MCH: 29.2 pg (ref 26.0–34.0)
MCHC: 31.5 g/dL (ref 30.0–36.0)
MCV: 92.5 fL (ref 78.0–100.0)
PLATELETS: 283 10*3/uL (ref 150–400)
RBC: 2.95 MIL/uL — AB (ref 4.22–5.81)
RDW: 15.9 % — ABNORMAL HIGH (ref 11.5–15.5)
WBC: 11.9 10*3/uL — ABNORMAL HIGH (ref 4.0–10.5)

## 2016-03-08 LAB — POCT I-STAT 4, (NA,K, GLUC, HGB,HCT)
GLUCOSE: 239 mg/dL — AB (ref 65–99)
HEMATOCRIT: 28 % — AB (ref 39.0–52.0)
Hemoglobin: 9.5 g/dL — ABNORMAL LOW (ref 13.0–17.0)
POTASSIUM: 4.4 mmol/L (ref 3.5–5.1)
SODIUM: 136 mmol/L (ref 135–145)

## 2016-03-08 MED ORDER — IPRATROPIUM-ALBUTEROL 0.5-2.5 (3) MG/3ML IN SOLN: 3.0000 mL | RESPIRATORY_TRACT | Status: DC | PRN

## 2016-03-08 NOTE — Progress Notes (Signed)
1 Day Post-Op Procedure(s) (LRB): FLEXIBLE VIDEO BRONCHOSCOPY (N/A) VIDEO ASSISTED THORACOSCOPY (VATS) (Right) Subjective:  Extubated this afternoon. No complaints  Objective: Vital signs in last 24 hours: Temp:  [97.4 F (36.3 C)-99.2 F (37.3 C)] 97.4 F (36.3 C) (11/07 1600) Pulse Rate:  [51-94] 65 (11/07 1900) Cardiac Rhythm: Normal sinus rhythm (11/07 1538) Resp:  [11-26] 21 (11/07 1900) BP: (87-175)/(47-79) 110/55 (11/07 1900) SpO2:  [93 %-100 %] 100 % (11/07 1900) Arterial Line BP: (62-287)/(33-73) 107/50 (11/07 1900) FiO2 (%):  [40 %-100 %] 50 % (11/07 1300)  Hemodynamic parameters for last 24 hours: CVP:  [12 mmHg-23 mmHg] 16 mmHg  Intake/Output from previous day: 11/06 0701 - 11/07 0700 In: 5381.3 [I.V.:3956.3; Blood:335; NG/GT:90; IV Piggyback:1000] Out: 3610 [Urine:970; Emesis/NG output:100; Blood:100; Chest Tube:240] Intake/Output this shift: No intake/output data recorded.  Heart: regular rate and rhythm, S1, S2 normal, no murmur, click, rub or gallop Lungs: diminished breath sounds RLL  Lab Results:  Recent Labs  03/07/16 0539 03/07/16 1155 03/08/16 0441  WBC 10.2  --  11.9*  HGB 8.2* 9.5* 8.6*  HCT 26.7* 28.0* 27.3*  PLT 322  --  283   BMET:  Recent Labs  03/07/16 0539 03/07/16 1155 03/08/16 0441  NA 136 136 137  K 4.0 4.4 4.1  CL 101  --  103  CO2 29  --  26  GLUCOSE 162* 239* 181*  BUN 9  --  8  CREATININE 0.65  --  0.63  CALCIUM 7.7*  --  7.6*    PT/INR:  Recent Labs  03/06/16 1345  LABPROT 15.0  INR 1.17   ABG    Component Value Date/Time   PHART 7.426 03/08/2016 1233   HCO3 27.1 03/08/2016 1233   TCO2 28 03/08/2016 1233   O2SAT 97.0 03/08/2016 1233   CBG (last 3)   Recent Labs  03/08/16 0842 03/08/16 1212 03/08/16 1604  GLUCAP 200* 205* 141*   CLINICAL DATA:  Hypoxia  EXAM: PORTABLE CHEST 1 VIEW  COMPARISON:  March 07, 2016  FINDINGS: Endotracheal tube tip is 6.3 cm above the carina. There are  chest tubes on the right, unchanged in position. Central catheter tip is in the superior vena cava. Nasogastric tube tip and side port are below the diaphragm. No pneumothorax. There is airspace consolidation in the right mid and lower lung zones with small right pleural effusion, stable. Left lung is clear. Heart is upper normal in size with pulmonary vascularity within normal limits. No adenopathy. No evident bone lesions.  IMPRESSION: Tube and catheter positions as described without pneumothorax. Airspace consolidation right mid and lower lung zones, stable. No new opacity. Stable cardiac silhouette.   Electronically Signed   By: Lowella Grip III M.D.   On: 03/08/2016 09:20  Assessment/Plan: S/P Procedure(s) (LRB): FLEXIBLE VIDEO BRONCHOSCOPY (N/A) VIDEO ASSISTED THORACOSCOPY (VATS) (Right)  He is stable today. CT output low. CXR showed some persistent consolidation of the RML and RLL. Keep chest tubes to suction. Follow up on cultures and cytology. General care per CCM.   LOS: 19 days    Gaye Pollack 03/08/2016

## 2016-03-08 NOTE — Procedures (Signed)
Extubation Procedure Note  Patient Details:   Name: Richard Fields DOB: 1941-03-29 MRN: [id number]   Airway Documentation:     Evaluation  O2 sats: stable throughout Complications: No apparent complications Patient did tolerate procedure well. Bilateral Breath Sounds: Diminished   Yes   Patient extubated to 3L nasal cannula.  Positive cuff leak noted.  Patient able to speak post extubation.  sats currently 98%.  Vitals are stable.  No evidence of stridor.  No complications noted.  Alphia Moh N 03/08/2016, 1:33 PM

## 2016-03-08 NOTE — Progress Notes (Signed)
Nutrition Follow up  DOCUMENTATION CODES:   Morbid obesity  INTERVENTION:    If TF started, recommend Vital High Protein formula at goal rate of 55 ml/h (1320 ml per day) and Prostat 60 ml TID to provide 1920 kcals, 205 gm protein, 1104 ml free water daily.  NUTRITION DIAGNOSIS:   Inadequate oral intake related to inability to eat as evidenced by NPO status, ongoing  GOAL:   Provide needs based on ASPEN/SCCM guidelines, currently unmet  MONITOR:   Vent status, Labs, Weight trends, Skin, I & O's  ASSESSMENT:  75 yo Male remote smoker with DM with severe peripheral neuropathy, hypertension and hyperlipidemia who was admitted to [facility] 02/18/2016 with RLL cavitary pneumonia. Patient is unreliable historian but family and friend deny known baseline lung disease. Thoracentesis 10/30- bloody fluid, culture neg. CT 110/30- large R effusion significantly compressing R lung.  Patient s/p procedures 11/6: FLEXIBLE VIDEO BRONCHOSCOPY  VIDEO ASSISTED THORACOSCOPY  DRAINAGE OF EMPYEMA  Patient is currently intubated on ventilator support >> OGT in place MV: 8.3 L/min Temp (24hrs), Avg:98.6 F (37 C), Min:97.4 F (36.3 C), Max:99.2 F (37.3 C)  Prior to surgery, pt was briefly on a Heart Healthy/Carbohydrate Modified diet. PO intake was 100% per flowsheet records. Now with acute hypoxic respiratory failure post-op. Labs and medications reviewed. CBG's 175-174-200.  Diet Order:  Diet NPO time specified  Skin: Stage II left buttock  Last BM: 11/2   Height:   Ht Readings from Last 1 Encounters:  02/23/16 5\' 11"  (1.803 m)    Weight:   Wt Readings from Last 1 Encounters:  03/06/16 299 lb 2.6 oz (135.7 kg)  Admit wt         285 lb (129.3 kg)  Ideal Body Weight:  78 kg  BMI:  Body mass index is 41.72 kg/m.  Re-estimated Nutritional Needs:   Kcal:  [id number]   Protein:  >/= 195 gm  Fluid:  per MD  EDUCATION NEEDS:   No education needs identified at this time    Arthur Holms, RD, LDN Pager #: [phone] After-Hours Pager #: [phone]

## 2016-03-08 NOTE — Progress Notes (Signed)
PULMONARY / CRITICAL CARE MEDICINE   Name: Richard Fields MRN: [id number] DOB: 13-Nov-1940    ADMISSION DATE:  02/18/2016 CONSULTATION DATE:  03/06/16  REFERRING MD :  TRH  CHIEF COMPLAINT:  Dyspnea with pleural effusion  BRIEF PATIENT DESCRIPTION:  75 yo remote smoker with DM with severe peripheral neuropathy, hypertension and hyperlipidemia who was admitted to [facility] 02/18/2016 with RLL cavitary pneumonia. Patient is unreliable historian but family and friend deny known baseline lung disease. Thoracentesis 10/30- bloody fluid, culture neg. CT 110/30- large R effusion significantly compressing R lung.  SUBJECTIVE:  Intubated. Opens eyes to verbal, responds to commands. Per nursing has baseline dementia.   VITAL SIGNS: BP 117/66   Pulse (!) 57   Temp 98.4 F (36.9 C) (Axillary)   Resp 14   Ht 5\' 11"  (1.803 m)   Wt 135.7 kg (299 lb 2.6 oz)   SpO2 100%   BMI 41.72 kg/m   HEMODYNAMICS: CVP:  [13 mmHg-18 mmHg] 17 mmHg  VENTILATOR SETTINGS: Vent Mode: PRVC FiO2 (%):  [40 %-100 %] 40 % Set Rate:  [14 bmp] 14 bmp Vt Set:  [600 mL] 600 mL PEEP:  [5 cmH20-10 cmH20] 10 cmH20 Plateau Pressure:  [20 [id number] cmH20] 24 cmH20  INTAKE / OUTPUT: I/O last 3 completed shifts: In: 5931.3 [I.V.:3956.3; Blood:335; NG/GT:90; IV Piggyback:1550] Out: [id number] [Urine:1895; Emesis/NG output:100; Other:2200; Blood:100; Chest Tube:240]  PHYSICAL EXAMINATION: General:  Intubated, sedated Neuro:  Opens eyes to verbal, responds to commands approproiatly HEENT:  PEERL, [place]/AT Cardiovascular:  RRR no mrg, mild LE edema Lungs:  Symmetric chest wall motion,  Decreased sounds on right. Chest tube on right Abdomen:  Soft, non tender, non distended, bowel sounds present Musculoskeletal:  Moves all 4 extremities Skin:  Warm, dry  LABS:  BMET  Recent Labs Lab 03/06/16 0456 03/07/16 0539 03/07/16 1155 03/08/16 0441  NA 134* 136 136 137  K 4.0 4.0 4.4 4.1  CL 100* 101  --  103  CO2 30  29  --  26  BUN 11 9  --  8  CREATININE 0.55* 0.65  --  0.63  GLUCOSE 162* 162* 239* 181*    Electrolytes  Recent Labs Lab 03/06/16 0456 03/07/16 0539 03/08/16 0441  CALCIUM 7.7* 7.7* 7.6*    CBC  Recent Labs Lab 03/06/16 0456 03/07/16 0539 03/07/16 1155 03/08/16 0441  WBC 11.3* 10.2  --  11.9*  HGB 8.7* 8.2* 9.5* 8.6*  HCT 27.7* 26.7* 28.0* 27.3*  PLT 327 322  --  283    Coag's  Recent Labs Lab 03/06/16 1345  APTT 33  INR 1.17    Sepsis Markers  Recent Labs Lab 03/05/16 1101  LATICACIDVEN 1.4    ABG  Recent Labs Lab 03/07/16 1709 03/07/16 1834 03/08/16 0409  PHART 7.431 7.430 7.408  PCO2ART 38.6 38.6 42.9  PO2ART 49.0* 73.0* 220.0*    Liver Enzymes  Recent Labs Lab 03/06/16 0456 03/07/16 0539 03/08/16 0441  AST 13* 18 16  ALT 15* 18 16*  ALKPHOS 78 69 70  BILITOT 0.8 0.8 0.9  ALBUMIN 1.9* 1.8* 1.8*    Cardiac Enzymes  Recent Labs Lab 03/05/16 1101 03/05/16 1536 03/05/16 2250  TROPONINI 0.13* 0.11* 0.12*    Glucose  Recent Labs Lab 03/07/16 0633 03/07/16 1625 03/07/16 1931 03/08/16 0003 03/08/16 0336 03/08/16 0842  GLUCAP 156* 173* 147* 175* 174* 200*    Imaging Dg Chest Port 1 View  Result Date: 03/07/2016 CLINICAL DATA:  Initial evaluation for acute  hypoxia. EXAM: PORTABLE CHEST 1 VIEW COMPARISON:  Prior radiograph from earlier the same day. FINDINGS: Patient remains intubated with an endotracheal tube in place. Tip positioned approximately 5.3 cm above the carina. Enteric tube courses in the the abdomen. Right IJ approach since venous catheter in stable position with tip overlying the distal SVC. Additional left-sided PICC catheter also in stable position with tip overlying the confluence of the brachiocephalic vein and SVC. Cardiac and mediastinal silhouettes are unchanged. Lungs are mildly hypoinflated. Right-sided pleural effusion appears worsened from prior study. Slightly worsened opacity within the right mid  and lower lung, likely atelectasis and/or fluid. No overt pulmonary edema. Right-sided chest tube remains in place with tip at the right lung apex. No appreciable pneumothorax. Minimal left basilar atelectasis. Osseous structures unchanged. IMPRESSION: 1. Interval worsening in right pleural effusion with increased opacity within the right mid and lower lung, likely atelectasis and/or effusion. 2. Right-sided chest tube in place with tip overlying the right lung apex. No appreciable pneumothorax. 3. Remaining support apparatus in satisfactory stable position. Electronically Signed   By: Jeannine Boga M.D.   On: 03/07/2016 18:08   Dg Chest Port 1 View  Result Date: 03/07/2016 CLINICAL DATA:  Postop VATS. EXAM: PORTABLE CHEST 1 VIEW COMPARISON:  03/05/2016 and CT 03/05/2016 FINDINGS: Patient is rotated to the right. Endotracheal tube has tip 4.3 cm above the carina. Right IJ central venous catheter has tip over the SVC. There is a left-sided PICC line unchanged with tip obliquely warranted at the junction of the left brachiocephalic vein to SVC. Right upper and right basilar chest tubes in place. Significant interval improvement in the previously seen large right pleural effusion. Persistent hazy opacification over the right mid to lower lung likely residual fluid/ atelectasis. Cannot exclude small loculated pneumothorax over the right base. Stable cardiomegaly. Remainder of the exam is unchanged. IMPRESSION: Moderate interval improvement in right-sided pleural effusion with residual opacification in the right middle to lower lung likely residual fluid/atelectasis. Cannot completely exclude small loculated lateral right basilar pneumothorax. Tubes and lines as described. Electronically Signed   By: Marin Olp M.D.   On: 03/07/2016 12:11     STUDIES:    Cultures Pleural fluid 11/6>>> Pleural tissue 11/6: no orgs>>>  ANTIBIOTICS Cefepime 11/5>>> vanc 11/5>>> Flagyl 11/5>>> Levaquin  10/31>>>11/5 Fluconazole 11/5>>>11/5  SIGNIFICANT EVENTS  11/5 PCCM evaluated  11/6 right chest VATS, remained hypoxic / vented  LINES/TUBES: ETT 11/6 >> Central Line 11/6 >> Art line 11/6 >> NG 11/6 >> Chest tube right 11/6 >>  Discussion: White male w/ sig h/o MO and DM. Admitted w/ cavitary PNA and complicated pleural effusion. Now s/p VATS. Unable to extubate in PACU d/t hypoventilation and low Vts. Will cont full vent support, titrate sedating gtts per PAD protocol and cont empiric abx. Will add flagyl to current regimen. Given his body habitus we may need to have BIPAP after extubation.   ASSESSMENT / PLAN:  Pulmonary: A: Cavitary Pneumonia w/ associated Large R pleural effusion- hydro/hemothorax. s/p Right VATS and flex bronch  Acute Hypoxic respiratory failure. ->failed weaning attempt in PACU.  COPD Possible OSA/OHS P: Cont full vent support SBT today if peep improved, likley F/u cytology from pleural fluid Cont BDs May need to extubate to BIPAP nocturnal  Cardiovascular  A: Hypotension. Likely medication related. Resolving P: CVP 12-15 Use cuff Tritiate neo to off to map 60 Cont IVFs  Renal A: No acute issues P: Trend I&O Avoid hypotension  F/u am labs kvo  Infection: A: Complicated right pleural effusion/empyema  Cavitary PNA P Cont vanc, cefepime and flagyl Follow surgical path to guide abx changes  GI A:  Morbid obesity  P: PPI NPO Hold off from tube feeds for now unless extubated Will use volume based feeding  Neuro: A: Severe deconditioning  Sedation P: PAD protocol  PT consult On precedex, responds to commands WUA  Endocrine A: DM w/ hyperglycemia  P SSI   A: Anemia P: Trend CBC [place] heparin   FAMILY  - Updates: No family at bedside  - Inter-disciplinary family meet or Palliative Care meeting due by:    Quin Hoop, [place] 4 Attending Dr Titus Mould  Pulmonary and [place] Pager: [phone]  03/08/2016, 9:17 AM  STAFF NOTE: I, Merrie Roof, MD FACP have personally reviewed patient's available data, including medical history, events of note, physical examination and test results as part of my evaluation. I have discussed with resident/NP and other care providers such as pharmacist, RN and RRT. In addition, I personally evaluated patient and elicited key findings of: sedated rass -1, FC, ronchi improved rt, output noted chest tube with some improvement in aeration pcxr, ABG reviewed pao2 is 220, will have goal to get to peep 5 then sbt planned cpap 5 ps 5, goa,l 30 min , assess rsbi and abg, keep MV same, his A line is NOT accurate and on low dose neo, neo causing mild reflex brady, may need to change to levophed, hope can dc pressors today, will feed with volume based feeding if not extubated today, he is benefiting from precedex well in setting dementia, will tolerate in setting neo needs for now, may need to use fent int, pcxr in am, goal is extubation today if able, keep same coverage abx realziing no anearobic coverage with cefepime The patient is critically ill with multiple organ systems failure and requires high complexity decision making for assessment and support, frequent evaluation and titration of therapies, application of advanced monitoring technologies and extensive interpretation of multiple databases.   Critical Care Time devoted to patient care services described in this note is 35 Minutes. This time reflects time of care of this signee: Merrie Roof, MD FACP. This critical care time does not reflect procedure time, or teaching time or supervisory time of PA/NP/Med student/Med Resident etc but could involve care discussion time. Rest per NP/medical resident whose note is outlined above and that I agree with   Lavon Paganini. Titus Mould, MD, [place] Pgr: [facility] 03/08/2016 10:33 AM

## 2016-03-09 ENCOUNTER — Inpatient Hospital Stay (HOSPITAL_COMMUNITY): Payer: Medicare Other

## 2016-03-09 DIAGNOSIS — M25511 Pain in right shoulder: Secondary | ICD-10-CM

## 2016-03-09 LAB — COMPREHENSIVE METABOLIC PANEL
ALT: 16 U/L — ABNORMAL LOW (ref 17–63)
AST: 17 U/L (ref 15–41)
Albumin: 1.6 g/dL — ABNORMAL LOW (ref 3.5–5.0)
Alkaline Phosphatase: 65 U/L (ref 38–126)
Anion gap: 7 (ref 5–15)
BUN: 8 mg/dL (ref 6–20)
CHLORIDE: 107 mmol/L (ref 101–111)
CO2: 24 mmol/L (ref 22–32)
Calcium: 7.4 mg/dL — ABNORMAL LOW (ref 8.9–10.3)
Creatinine, Ser: 0.61 mg/dL (ref 0.61–1.24)
GFR calc Af Amer: >60 mL/min (ref >60)
Glucose, Bld: 124 mg/dL — ABNORMAL HIGH (ref 65–99)
POTASSIUM: 3.3 mmol/L — AB (ref 3.5–5.1)
SODIUM: 138 mmol/L (ref 135–145)
Total Bilirubin: 1.1 mg/dL (ref 0.3–1.2)
Total Protein: 4.4 g/dL — ABNORMAL LOW (ref 6.5–8.1)

## 2016-03-09 LAB — CBC
HCT: 26.3 % — ABNORMAL LOW (ref 39.0–52.0)
Hemoglobin: 8.1 g/dL — ABNORMAL LOW (ref 13.0–17.0)
MCH: 28.9 pg (ref 26.0–34.0)
MCHC: 30.8 g/dL (ref 30.0–36.0)
MCV: 93.9 fL (ref 78.0–100.0)
PLATELETS: 252 10*3/uL (ref 150–400)
RBC: 2.8 MIL/uL — ABNORMAL LOW (ref 4.22–5.81)
RDW: 16 % — AB (ref 11.5–15.5)
WBC: 12.8 10*3/uL — AB (ref 4.0–10.5)

## 2016-03-09 LAB — GLUCOSE, CAPILLARY
GLUCOSE-CAPILLARY: 125 mg/dL — AB (ref 65–99)
GLUCOSE-CAPILLARY: 152 mg/dL — AB (ref 65–99)
GLUCOSE-CAPILLARY: 119 mg/dL — AB (ref 65–99)
Glucose-Capillary: 103 mg/dL — ABNORMAL HIGH (ref 65–99)
Glucose-Capillary: 105 mg/dL — ABNORMAL HIGH (ref 65–99)
Glucose-Capillary: 133 mg/dL — ABNORMAL HIGH (ref 65–99)

## 2016-03-09 MED ORDER — POTASSIUM CHLORIDE 10 MEQ/50ML IV SOLN
10.0000 meq | INTRAVENOUS | Status: AC
  Administered 2016-03-09 (×3): 10 meq via INTRAVENOUS
  Filled 2016-03-09 (×2): qty 50

## 2016-03-09 MED ORDER — LABETALOL HCL 5 MG/ML IV SOLN
10.0000 mg | INTRAVENOUS | Status: DC | PRN
  Administered 2016-03-09 – 2016-03-10 (×4): 10 mg via INTRAVENOUS
  Filled 2016-03-09 (×2): qty 4

## 2016-03-09 MED ORDER — LEVOFLOXACIN IN D5W 750 MG/150ML IV SOLN
750.0000 mg | INTRAVENOUS | Status: AC
  Administered 2016-03-09 – 2016-03-12 (×4): 750 mg via INTRAVENOUS
  Filled 2016-03-09 (×5): qty 150

## 2016-03-09 NOTE — Progress Notes (Signed)
Assessments remain unchanged from previously, Dr Titus Mould has made rounds and updated. Family at bedside have been updated. No distress noted, vs wnl. Lines patent and intact.

## 2016-03-09 NOTE — Progress Notes (Signed)
      [address] HeightsSuite [address]       [place] [address]             [phone]      Sleeping at present  BP (!) 150/65 (BP Location: Right Arm)   Pulse (!) 111   Temp 99.6 F (37.6 C)   Resp (!) 32   Ht 5\' 11"  (1.803 m)   Wt 299 lb 2.6 oz (135.7 kg)   SpO2 98%   BMI 41.72 kg/m    Intake/Output Summary (Last 24 hours) at 03/09/16 1721 Last data filed at 03/09/16 1700  Gross per 24 hour  Intake           3378.4 ml  Output             2125 ml  Net           1253.4 ml    No new issues  Remo Lipps C. Roxan Hockey, MD [facility] [phone]

## 2016-03-09 NOTE — Progress Notes (Signed)
PULMONARY / CRITICAL CARE MEDICINE   Name: Richard Fields MRN: [id number] DOB: 08/11/1940    ADMISSION DATE:  02/18/2016 CONSULTATION DATE:03/06/16  REFERRING MD:TRH  CHIEF COMPLAINT:Dyspnea with pleural effusion  BRIEF PATIENT DESCRIPTION: 75 yo remote smoker with DM with severe peripheral neuropathy, hypertension and hyperlipidemia who was admitted to [facility] 02/18/2016 with RLL cavitary pneumonia. Patient is unreliable historian but family and friend deny known baseline lung disease. Thoracentesis 10/30- bloody fluid, culture neg. CT 110/30- large R effusion significantly compressing R lung.  SUBJECTIVE:  Awake in bed, few episodes of agitation over night, likely due to dementia at baseline. Became tachy around 9pm and has remained since. No complaints from pt. Feels thirsty.  VITAL SIGNS: BP (!) 154/71   Pulse (!) 117   Temp 98.4 F (36.9 C) (Oral)   Resp (!) 28   Ht 5\' 11"  (1.803 m)   Wt 135.7 kg (299 lb 2.6 oz)   SpO2 98%   BMI 41.72 kg/m   HEMODYNAMICS: CVP:  [12 mmHg-23 mmHg] 13 mmHg  VENTILATOR SETTINGS: Vent Mode: PSV;CPAP FiO2 (%):  [40 %-50 %] 50 % PEEP:  [5 cmH20-8 cmH20] 5 cmH20 Pressure Support:  [5 cmH20] 5 cmH20  INTAKE / OUTPUT: I/O last 3 completed shifts: In: 5803.3 [P.O.:90; I.V.:3793.3; NG/GT:120; IV Piggyback:1800] Out: 2080 [Urine:1750; Emesis/NG output:200; Chest Tube:130]  PHYSICAL EXAMINATION: General:  NAD Neuro:  Awake, alert HEENT:  PEERL, [place]/AT Cardiovascular:  Tachycardic, regular, nor mrg, mild peripheral edema Lungs: diminished on the right, rhonchi at bases Abdomen:  Soft, nontender, non distended, bowel sounds present Musculoskeletal:  Moves all 4 extremities Skin:  Warm, dry, intact  LABS:  BMET  Recent Labs Lab 03/07/16 0539 03/07/16 1155 03/08/16 0441 03/09/16 0415  NA 136 136 137 138  K 4.0 4.4 4.1 3.3*  CL 101  --  103 107  CO2 29  --  26 24  BUN 9  --  8 8  CREATININE 0.65  --  0.63 0.61   GLUCOSE 162* 239* 181* 124*    Electrolytes  Recent Labs Lab 03/07/16 0539 03/08/16 0441 03/09/16 0415  CALCIUM 7.7* 7.6* 7.4*    CBC  Recent Labs Lab 03/07/16 0539 03/07/16 1155 03/08/16 0441 03/09/16 0415  WBC 10.2  --  11.9* 12.8*  HGB 8.2* 9.5* 8.6* 8.1*  HCT 26.7* 28.0* 27.3* 26.3*  PLT 322  --  283 252    Coag's  Recent Labs Lab 03/06/16 1345  APTT 33  INR 1.17    Sepsis Markers  Recent Labs Lab 03/05/16 1101  LATICACIDVEN 1.4    ABG  Recent Labs Lab 03/07/16 1834 03/08/16 0409 03/08/16 1233  PHART 7.430 7.408 7.426  PCO2ART 38.6 42.9 41.1  PO2ART 73.0* 220.0* 88.0    Liver Enzymes  Recent Labs Lab 03/07/16 0539 03/08/16 0441 03/09/16 0415  AST 18 16 17   ALT 18 16* 16*  ALKPHOS 69 70 65  BILITOT 0.8 0.9 1.1  ALBUMIN 1.8* 1.8* 1.6*    Cardiac Enzymes  Recent Labs Lab 03/05/16 1101 03/05/16 1536 03/05/16 2250  TROPONINI 0.13* 0.11* 0.12*    Glucose  Recent Labs Lab 03/08/16 0842 03/08/16 1212 03/08/16 1604 03/08/16 1928 03/08/16 2348 03/09/16 0352  GLUCAP 200* 205* 141* 109* 116* 125*    Imaging No results found.   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 Admitted w/ cavitary PNA and complicated pleural effusion. Now s/p VATS. Unable to extubate in PACU d/t hypoventilation and low Vts. Extubated. cont empiric abx vanc, cefepime, flagyl.  ASSESSMENT / PLAN:  Pulmonary: A: Cavitary Pneumonia w/ associated Large R pleural effusion-hydro/hemothorax. s/p Right VATS and flex bronch  Acute  Hypoxic respiratory failure. ->failed weaning attempt in PACU.  COPD Possible OSA/OHS P: Extubated F/u cytology from pleural fluid IS as able  Cardiovascular  A: Hypotension. Likely medication related. Resolving Tachycardia, not hTN P: CVP 12-15 Use cuff Tritiate neo to off to map 60 Cont IVFs at 50  Add labetolol  Renal A: Hypokalemia P: Replace K Trend I&O Avoid hypotension  F/u am labs kvo  Infection: A: Complicated right pleural effusion/empyema  Cavitary PNA WBC up today P Cont vanc, cefepime and flagyl Follow surgical path to guide abx changes  GI A:  Morbid obesity  P: PPI NPO Swallow study today then give diet  Neuro: A: Awake and alert Severe deconditioning  P: PT consult  Endocrine A: DM w/ hyperglycemia  P SSI   A: Anemia P: Trend CBC [place] heparin   FAMILY  - Updates: No family at bedside  - Inter-disciplinary family meet or Palliative Care meeting due by:    Quin Hoop, [place] 4 Attending Dr Titus Mould  Pulmonary and [place] Pager: [phone]  03/09/2016, 8:44 AM   STAFF NOTE: I, Merrie Roof, MD FACP have personally reviewed patient's available data, including medical history, events of note, physical examination and test results as part of my evaluation. I have discussed with resident/NP and other care providers such as pharmacist, RN and RRT. In addition, I personally evaluated patient and elicited key findings of: extubated, slight tachy, awake, coughing well, pcxr is improved, NO distress, tachy, tsh wnl, not on BB at home, HTn now, add labetolol, he is 4 liters pos last 36 hours, do not think this is hypovolemia, get ecg r/o fib, k supp, bmet in am, mobilize as able, does not appear to be empyema, narrow off abx to levofloxacin and treat for total abx 8 days, pcxr in am , cvts managing Chest tubes, I will d/w wife reintubation and code status wishes, tio be aggressive would  be futile, to sdu, [facility]   Lavon Paganini. Titus Mould, MD, [place] Pgr: [place] [facility] 03/09/2016 10:42 AM  \

## 2016-03-09 NOTE — Progress Notes (Signed)
Pharmacy Antibiotic Note  Richard Fields is a 75 y.o. male admitted on 02/18/2016 with empyema.  Pharmacy has been consulted for Levaquin dosing for HCAP.  Changing from Vancomycin, Cefepime and Metronidazole.   Plan:  Levaquin 750 mg IV q24hrs.  Will follow renal function, final culture data, length of therapy.  Height: 5\' 11"  (180.3 cm) Weight: 299 lb 2.6 oz (135.7 kg) IBW/kg (Calculated) : 75.3  Temp (24hrs), Avg:98.6 F (37 C), Min:97.4 F (36.3 C), Max:100.3 F (37.9 C)   Recent Labs Lab 03/05/16 1101 03/06/16 0456 03/07/16 0539 03/08/16 0441 03/09/16 0415  WBC 14.3* 11.3* 10.2 11.9* 12.8*  CREATININE 0.78 0.55* 0.65 0.63 0.61  LATICACIDVEN 1.4  --   --   --   --     Estimated Creatinine Clearance: 108.8 mL/min (by C-G formula based on SCr of 0.61 mg/dL).    Allergies  Allergen Reactions  . Penicillins Other (See Comments)    Unknown-reaction is unknown  . Tegretol [Carbamazepine] Itching    Antimicrobials this admission: vanco 10/19 >10/31; 11/5 >>11/8 primaxin 10/19 > 10/31 levaquin 10/19 x 1; 10/31 >11/5; resume 11/8>> Cefepime 11/5 >>11/8 Flagyl 11/5>> 11/8 CHG/Bactroban 11/6>>(11/10)  Dose adjustments this admission:  10/21 Vanc torugh 14 mcg/ml Vancomycin increased 1gm to 1250mg  IV q8h  10/24 Vanc trough 17 mc/gml  - at goal  10/31 Vanc trough 18 mc/gml - at goal  Microbiology results: 10/19: blood x 2 - negative 10/20: MRSA PCR - positive 10/30: right pleural fluid - negative 11/5 surgical MRSA PCR - positive 11/6 tissue R pleural - no growth x 2 days so far 11/6 fluid R pleural - no growth x 2 days so far  Thank you for allowing pharmacy to be a part of this patient's care.  Arty Baumgartner, [place] Pager: [phone] 03/09/2016 11:16 AM

## 2016-03-09 NOTE — Progress Notes (Signed)
2 Days Post-Op Procedure(s) (LRB): FLEXIBLE VIDEO BRONCHOSCOPY (N/A) VIDEO ASSISTED THORACOSCOPY (VATS) (Right) Subjective: No complaints  Objective: Vital signs in last 24 hours: Temp:  [98.2 F (36.8 C)-100.3 F (37.9 C)] 98.7 F (37.1 C) (11/08 1600) Pulse Rate:  [58-123] 111 (11/08 1600) Cardiac Rhythm: Sinus tachycardia (11/08 1600) Resp:  [16-32] 32 (11/08 1600) BP: (110-189)/(55-82) 150/65 (11/08 1600) SpO2:  [92 %-100 %] 98 % (11/08 1600) Arterial Line BP: (79-167)/(50-67) 111/67 (11/08 0400)  Hemodynamic parameters for last 24 hours: CVP:  [13 mmHg-23 mmHg] 15 mmHg  Intake/Output from previous day: 11/07 0701 - 11/08 0700 In: 3713.4 [P.O.:90; I.V.:2443.4; NG/GT:30; IV Piggyback:1150] Out: 1410 [Urine:1210; Emesis/NG output:100; Chest Tube:100] Intake/Output this shift: Total I/O In: 1500 [I.V.:1100; IV Piggyback:400] Out: 1400 [Urine:1350; Chest Tube:50]  General appearance: slowed mentation Lungs: diminished breath sounds RLL  Lab Results:  Recent Labs  03/08/16 0441 03/09/16 0415  WBC 11.9* 12.8*  HGB 8.6* 8.1*  HCT 27.3* 26.3*  PLT 283 252   BMET:  Recent Labs  03/08/16 0441 03/09/16 0415  NA 137 138  K 4.1 3.3*  CL 103 107  CO2 26 24  GLUCOSE 181* 124*  BUN 8 8  CREATININE 0.63 0.61  CALCIUM 7.6* 7.4*    PT/INR: No results for input(s): LABPROT, INR in the last 72 hours. ABG    Component Value Date/Time   PHART 7.426 03/08/2016 1233   HCO3 27.1 03/08/2016 1233   TCO2 28 03/08/2016 1233   O2SAT 97.0 03/08/2016 1233   CBG (last 3)   Recent Labs  03/09/16 0352 03/09/16 0911 03/09/16 1231  GLUCAP 125* 133* 152*    Assessment/Plan: S/P Procedure(s) (LRB): FLEXIBLE VIDEO BRONCHOSCOPY (N/A) VIDEO ASSISTED THORACOSCOPY (VATS) (Right)  CXR is stable  Culture of fluid negative so far and no organisms on gram stain Cytology negative for malignancy. Suspect this is parapneumonic effusion that had not turned into infected empyema  yet. CT output is low. Will probably remove one tube tomorrow and the remaining tube the following day.   LOS: 20 days    Gaye Pollack 03/09/2016

## 2016-03-10 DIAGNOSIS — I4891 Unspecified atrial fibrillation: Secondary | ICD-10-CM

## 2016-03-10 DIAGNOSIS — E118 Type 2 diabetes mellitus with unspecified complications: Secondary | ICD-10-CM

## 2016-03-10 DIAGNOSIS — IMO0002 Reserved for concepts with insufficient information to code with codable children: Secondary | ICD-10-CM

## 2016-03-10 DIAGNOSIS — E1165 Type 2 diabetes mellitus with hyperglycemia: Secondary | ICD-10-CM

## 2016-03-10 DIAGNOSIS — J869 Pyothorax without fistula: Secondary | ICD-10-CM

## 2016-03-10 LAB — TYPE AND SCREEN
ABO/RH(D): O POS
Antibody Screen: NEGATIVE
Unit division: 0
Unit division: 0

## 2016-03-10 LAB — COMPREHENSIVE METABOLIC PANEL
ALT: 17 U/L (ref 17–63)
AST: 16 U/L (ref 15–41)
Albumin: 1.5 g/dL — ABNORMAL LOW (ref 3.5–5.0)
Alkaline Phosphatase: 74 U/L (ref 38–126)
Anion gap: 4 — ABNORMAL LOW (ref 5–15)
BUN: 6 mg/dL (ref 6–20)
CHLORIDE: 107 mmol/L (ref 101–111)
CO2: 26 mmol/L (ref 22–32)
CREATININE: 0.57 mg/dL — AB (ref 0.61–1.24)
Calcium: 7.4 mg/dL — ABNORMAL LOW (ref 8.9–10.3)
GFR calc Af Amer: >60 mL/min (ref >60)
GLUCOSE: 127 mg/dL — AB (ref 65–99)
Potassium: 3.6 mmol/L (ref 3.5–5.1)
Sodium: 137 mmol/L (ref 135–145)
Total Bilirubin: 0.6 mg/dL (ref 0.3–1.2)
Total Protein: 4.5 g/dL — ABNORMAL LOW (ref 6.5–8.1)

## 2016-03-10 LAB — GLUCOSE, CAPILLARY
GLUCOSE-CAPILLARY: 118 mg/dL — AB (ref 65–99)
GLUCOSE-CAPILLARY: 130 mg/dL — AB (ref 65–99)
GLUCOSE-CAPILLARY: 102 mg/dL — AB (ref 65–99)
GLUCOSE-CAPILLARY: 126 mg/dL — AB (ref 65–99)
Glucose-Capillary: 129 mg/dL — ABNORMAL HIGH (ref 65–99)
Glucose-Capillary: 117 mg/dL — ABNORMAL HIGH (ref 65–99)

## 2016-03-10 LAB — CBC
HEMATOCRIT: 27 % — AB (ref 39.0–52.0)
Hemoglobin: 8.3 g/dL — ABNORMAL LOW (ref 13.0–17.0)
MCH: 29.2 pg (ref 26.0–34.0)
MCHC: 30.7 g/dL (ref 30.0–36.0)
MCV: 95.1 fL (ref 78.0–100.0)
PLATELETS: 275 10*3/uL (ref 150–400)
RBC: 2.84 MIL/uL — AB (ref 4.22–5.81)
RDW: 16.2 % — ABNORMAL HIGH (ref 11.5–15.5)
WBC: 13.6 10*3/uL — AB (ref 4.0–10.5)

## 2016-03-10 MED ORDER — DEXTROSE-NACL 5-0.9 % IV SOLN
INTRAVENOUS | Status: DC
  Administered 2016-03-10 (×2): via INTRAVENOUS

## 2016-03-10 MED ORDER — IPRATROPIUM-ALBUTEROL 0.5-2.5 (3) MG/3ML IN SOLN
3.0000 mL | Freq: Three times a day (TID) | RESPIRATORY_TRACT | Status: DC
  Administered 2016-03-10: 3 mL via RESPIRATORY_TRACT
  Filled 2016-03-10: qty 3

## 2016-03-10 MED ORDER — HEPARIN SODIUM (PORCINE) 5000 UNIT/ML IJ SOLN
5000.0000 [IU] | Freq: Three times a day (TID) | INTRAMUSCULAR | Status: DC
  Administered 2016-03-10 – 2016-03-11 (×2): 5000 [IU] via SUBCUTANEOUS
  Filled 2016-03-10 (×2): qty 1

## 2016-03-10 MED ORDER — METOPROLOL TARTRATE 5 MG/5ML IV SOLN
2.5000 mg | Freq: Four times a day (QID) | INTRAVENOUS | Status: DC | PRN
  Administered 2016-03-13: 2.5 mg via INTRAVENOUS
  Filled 2016-03-10: qty 5

## 2016-03-10 NOTE — Clinical Social Work Note (Signed)
CSW continues to follow for discharge needs.  Riyanna Crutchley, CSW [phone]  

## 2016-03-10 NOTE — Progress Notes (Addendum)
Pt anterior CT d/c per MD order, pt tol well, pt turn Cough and Deep Bx, pt VSS  Pt txiong 3S per MD order, Report called to receiving RN all questions answered, pt needs reinforcement of tx  MD called to clarify foley and VTE plan, currently has not returned call, will tx pt and update RN.  1145-Md called and updated, per MD/[place] cont foley, will address VTE, stat EKG for pos Afib/ HR-106

## 2016-03-10 NOTE — Op Note (Signed)
CARDIOTHORACIC SURGERY OPERATIVE NOTE:  ZAIVION GELINAS [id number] 03/07/2016   Preoperative Dx: Right empyema  Postoperative Dx: Right empyema   Procedure:   1. Flexible video bronchoscopy  2. Right video-assisted thoracoscopy, drainage of empyema.  Surgeon: Dr. Gaye Pollack   Assistant: Jadene Pierini, PAD  Anesthesia: GET   Clinical History:   The patient is a 75 year old gentleman with DM with severe peripheral neuropathy, hypertension and hyperlipidemia who was admitted to [facility] 02/18/2016 with RLL cavitary pneumonia. He is currently not able to answer questions due to his illness but his wife and her friend are here. His wife does have some memory disturbance so her good friend tries to be there for both of them. He had been marginal for a long time according to them with some confusion at times, generalized weakness and limited mobility due to neuropathy. He was admitted with a several week history of mild hemoptysis and right back pain. His CT on admission showed a small right effusion. A follow up scan on 02/29/2016 showed a large right pleural effusion with severe compressive atelectasis of the right lung. He had a thoracentesis on 10/30 removing 800 cc of old dark bloody fluid. He has continued to have respiratory difficulty and a follow up CT yesterday showed no PE. It did show a persistent/recurrent large loculated right pleural effusion with near complete collapse of the right lung. He was transferred here from [facility] for further evaluation.  Operative procedure:   The patient was seen in the preoperative holding area. The proper patient, proper operative side, proper operation were confirmed after reviewing his history and chest x-ray. The right side of the chest was signed by me. Preoperative intravenous antibiotics were given. He was taken back to the operating room and placed on table in the supine position. After induction of general endotracheal anesthesia  using a single-lumen tube, a Foley catheter was placed in the bladder using sterile technique. Lower extremity sequential compression devices were used. Then bronchoscopy was performed.  Flexible Bronchoscopy:  The video bronchoscope was passed down the endotracheal tube. The distal trachea was normal. The carina was sharp. The left bronchial tree had normal segmental anatomy with no endobronchial lesions or extrinsic compression. The right bronchial tree had normal segmental anatomy with no endobronchial lesions or extrinsic compression.  Right thoracoscopy:  The patient was positioned in the left lateral decubitus position with the right side up. The right side of the chest was prepped with Betadine soap and solution and draped in the usual sterile manner. A timeout was taken and the proper patient, proper operation, and proper operative side were confirmed with nursing and anesthesia staff. A 1 cm incision was made in the midaxillary line at about the eighth intercostal space. The left pleural space was entered bluntly with a hemostat and an 8 mm trocar was inserted. Immediately 2250 cc of serosanguinous fluid was removed. Samples were sent for culture and cytology. The 30 thoracoscope was inserted and the pleural space inspected. There were some fibrinous adhesions resulting in a multiloculated effusion. A second incision was made in the anterior axillary line at the 6th ICS. The loculations were easily disrupted with the suction wand. The lower lobe was adherent to the chest wall in one spot inferiorly and after this was separated from the chest wall it was apparent that this was the location of a small superficial lung abscess. It was unroofed. The upper lobe was firmly adherent to the chest wall anteriorly  and this was not separated.   Then two 28 French chest tubes were placed. One was positioned through the midaxillary incision and advanced posteriorly up to the apex. The second chest tube was  inserted through a small incision posterior to the first and advanced inferiorly to the costophrenic sulcus. These were fixed to the skin with silk sutures. The lung was reinflated under direct vision and I did not see any significant air leak. Hemostasis appeared adequate. The anterior axillary incision was then closed in layers using a 2-0 Vicryl subcutaneous suture and 3-0 Vicryl subcuticular skin closure. The chest tubes were connected to Pleur-evac suction. The sponge needle and instrument counts were correct according to the scrub nurse. The patient was then turned into the supine position, extubated, and transported to the post anesthesia care unit in satisfactory and stable condition.

## 2016-03-10 NOTE — Progress Notes (Signed)
PROGRESS NOTE    Richard Fields  GOT:[id number] DOB: 04-07-1941 DOA: 02/18/2016 PCP: Asencion Noble, MD   Brief Narrative:  75 y.o. WM PMHx PMHx DM Type 2 uncontrolled with complications, severe peripheral neuropathy, HLD, HTN,   Who presents by ambulance with complaints of blackish/brownish hemoptysis that onset 1 month ago. Very poor historian. H/o mainly obtained by caregiver and wife at bedside. Per wife he has associated right shoulder pain and profuse diaphoresis overnight. He denies any fever or chills. While in the ED, he was started on vancomycin and admitted for further evaluation of cavitary pneumonia.    Subjective: 11/9 sleepy but arousable answers questions appropriately, but appears to be somewhat confused.   Assessment & Plan:   Principal Problem:   Cavitary pneumonia Active Problems:   Hemoptysis   Morbid obesity ([place])   Pneumatosis intestinalis   Pressure injury of skin   Type II diabetes mellitus, uncontrolled (HCC)   Sepsis (HCC)   Elevated troponin level   Acute respiratory failure with hypoxia (HCC)   Encephalopathy, metabolic   Acute upper GI bleeding   Acute blood loss anemia   Protein-calorie malnutrition (HCC)   Abdominal pain, right lateral   Aspirin long-term use   Hypoxia   Pleural effusion on right   S/P thoracentesis   SOB (shortness of breath)   Empyema (HCC)   Acute pain of right shoulder   Cavitary Pneumonia w/ associated Large R pleural effusion/Hydro/Hemothorax. - 11/6 right chest VATS and flex bronch, posterior chest tube still in place -Continue current antibiotics  -To date right pleural fluid/tissue negative  Complicated right pleural effusion/empyema  -See cavitary pneumonia -Obtain ID consult on 11/10, leukocytosis reactive? Stop antibiotics?  Acute Hypoxic respiratory failure.  -Resolved.  -Titrate O2 to maintain SPO2> 93%  COPD -DuoNeb TID   OSA/OHS -CPAP/BiPAP per respiratory  Atrial fibrillation -Most  likely secondary to stress, currently not in RVR -Metoprolol IV  PRN 2.5 mg for HR > 110  Hypotension.  -Resolved  Hypokalemia -Resolved  Diabetes type 2 uncontrolled with, complications   -59/74 Hemoglobin A1c= -8.7  -Lantus 20 units daily -Custom SSI     DVT prophylaxis: Subcutaneous heparin  Code Status: Full Family Communication: None Disposition Plan: ??   Consultants:  Dr.Daniel Lily Kocher [place] [facility]    Procedures/Significant Events:  11/5 PCCM evaluated  11/5 Echocardiogram:Left ventricle: moderate concentric hypertrophy. - 65% to 70%.  -(grade 1 diastolic dysfunction). 11/6 right chest VATS, remained hypoxic / vented 11/9 repeat Right Flexible video bronchoscopy Right video-assisted thoracoscopy, drainage of empyema.   Cultures 10/19 blood right hand/left AC negative 10/20 MRSA by PCR positive 10/30 right pleural fluid negative  11/5 MRSA by PCR positive 11/6 right pleural fluid NGTD 11/6 right pleural tissue NGTD   Antimicrobials: Cefepime 11/5>>>11/8 Primaxin 10/19>> 10/31 Zosyn 11/51 dose vanc 10/22 >> 10/31: 11/5>>>11/8 Flagyl 11/5>>>11/8 Ceftriaxone 11/8>>> Levaquin 10/31>>>11/5: 11/8>> Fluconazole 11/5>>>11/5   Devices    LINES / TUBES:  ETT 11/6 >> Central Line 11/6 >> Art line 11/6 >> NG 11/6 >> Chest tube right 11/6 >>    Continuous Infusions: . sodium chloride 100 mL/hr at 03/10/16 0240  . phenylephrine (NEO-SYNEPHRINE) Adult infusion Stopped (03/08/16 2230)     Objective: Vitals:   03/10/16 0500 03/10/16 0600 03/10/16 0700 03/10/16 0735  BP: 102/75 (!) 110/54 113/69   Pulse: 84 83 81   Resp: (!) 26 (!) 28 (!) 23   Temp:    98.7 F (37.1 C)  TempSrc:    Oral  SpO2: 96% 98% 97%   Weight:      Height:        Intake/Output Summary (Last 24 hours) at 03/10/16 6599 Last data filed at 03/10/16 0700  Gross per 24 hour  Intake             2700 ml  Output              2385 ml  Net              315 ml   Filed Weights   03/03/16 0600 03/04/16 0314 03/06/16 0500  Weight: (!) 140.5 kg (309 lb 11.9 oz) (!) 137.9 kg (304 lb 0.2 oz) 135.7 kg (299 lb 2.6 oz)    Examination:  General: sleepy but arousable answers questions appropriately,, No acute respiratory distress Eyes: negative scleral hemorrhage, negative anisocoria, negative icterus ENT: Negative Runny nose, negative gingival bleeding, Neck:  Negative scars, masses, torticollis, lymphadenopathy, JVD, right IJ CVL covered and clean negative sign of infection Lungs: absent/poor breath sounds all right lung fields, left lung field clear to auscultation, without wheezes or crackles Cardiovascular: Regular rate and rhythm without murmur gallop or rub normal S1 and S2 Abdomen: Morbidly obese, negative abdominal pain, nondistended, positive soft, bowel sounds, no rebound, no ascites, no appreciable mass Extremities: No significant cyanosis, clubbing, or edema bilateral lower extremities Skin: Negative rashes, lesions, ulcers Psychiatric:  Negative depression, negative anxiety, negative fatigue, negative mania  Central nervous system:  Cranial nerves II through XII intact, tongue/uvula midline, all extremities muscle strength 5/5, sensation intact throughout, negative dysarthria, negative expressive aphasia, negative receptive aphasia.  .     Data Reviewed: Care during the described time interval was provided by me .  I have reviewed this patient's available data, including medical history, events of note, physical examination, and all test results as part of my evaluation. I have personally reviewed and interpreted all radiology studies.  CBC:  Recent Labs Lab 03/06/16 0456 03/07/16 0539 03/07/16 1155 03/08/16 0441 03/09/16 0415 03/10/16 0537  WBC 11.3* 10.2  --  11.9* 12.8* 13.6*  NEUTROABS  --  8.1*  --   --   --   --   HGB 8.7* 8.2* 9.5* 8.6* 8.1* 8.3*  HCT 27.7* 26.7* 28.0* 27.3* 26.3* 27.0*    MCV 95.2 94.0  --  92.5 93.9 95.1  PLT 327 322  --  283 252 357   Basic Metabolic Panel:  Recent Labs Lab 03/06/16 0456 03/07/16 0539 03/07/16 1155 03/08/16 0441 03/09/16 0415 03/10/16 0537  NA 134* 136 136 137 138 137  K 4.0 4.0 4.4 4.1 3.3* 3.6  CL 100* 101  --  103 107 107  CO2 30 29  --  '26 24 26  '$ GLUCOSE 162* 162* 239* 181* 124* 127*  BUN 11 9  --  '8 8 6  '$ CREATININE 0.55* 0.65  --  0.63 0.61 0.57*  CALCIUM 7.7* 7.7*  --  7.6* 7.4* 7.4*   GFR: Estimated Creatinine Clearance: 108.8 mL/min (by C-G formula based on SCr of 0.57 mg/dL (L)). Liver Function Tests:  Recent Labs Lab 03/06/16 0456 03/07/16 0539 03/08/16 0441 03/09/16 0415 03/10/16 0537  AST 13* '18 16 17 16  '$ ALT 15* 18 16* 16* 17  ALKPHOS 78 69 70 65 74  BILITOT 0.8 0.8 0.9 1.1 0.6  PROT 5.0* 5.0* 4.6* 4.4* 4.5*  ALBUMIN 1.9* 1.8* 1.8* 1.6* 1.5*   No results for input(s): LIPASE, AMYLASE in  the last 168 hours. No results for input(s): AMMONIA in the last 168 hours. Coagulation Profile:  Recent Labs Lab 03/06/16 1345  INR 1.17   Cardiac Enzymes:  Recent Labs Lab 03/05/16 1101 03/05/16 1536 03/05/16 2250  TROPONINI 0.13* 0.11* 0.12*   BNP (last 3 results) No results for input(s): PROBNP in the last 8760 hours. HbA1C: No results for input(s): HGBA1C in the last 72 hours. CBG:  Recent Labs Lab 03/09/16 1636 03/09/16 1945 03/09/16 2356 03/10/16 0356 03/10/16 0728  GLUCAP 119* 105* 103* 118* 130*   Lipid Profile: No results for input(s): CHOL, HDL, LDLCALC, TRIG, CHOLHDL, LDLDIRECT in the last 72 hours. Thyroid Function Tests: No results for input(s): TSH, T4TOTAL, FREET4, T3FREE, THYROIDAB in the last 72 hours. Anemia Panel: No results for input(s): VITAMINB12, FOLATE, FERRITIN, TIBC, IRON, RETICCTPCT in the last 72 hours. Urine analysis:    Component Value Date/Time   COLORURINE YELLOW 02/18/2016 1129   APPEARANCEUR CLEAR 02/18/2016 1129   LABSPEC 1.015 02/18/2016 1129    PHURINE 6.0 02/18/2016 1129   GLUCOSEU 500 (A) 02/18/2016 1129   HGBUR NEGATIVE 02/18/2016 1129   BILIRUBINUR NEGATIVE 02/18/2016 1129   KETONESUR 15 (A) 02/18/2016 1129   PROTEINUR TRACE (A) 02/18/2016 1129   UROBILINOGEN 0.2 08/20/2014 1257   NITRITE NEGATIVE 02/18/2016 1129   LEUKOCYTESUR NEGATIVE 02/18/2016 1129   Sepsis Labs: '@LABRCNTIP'$ (procalcitonin:4,lacticidven:4)  ) Recent Results (from the past 240 hour(s))  Gram stain     Status: None   Collection Time: 02/29/16  2:44 PM  Result Value Ref Range Status   Specimen Description FLUID RIGHT PLEURAL COLLECTED BY DOCTOR  Final   Special Requests NONE  Final   Gram Stain   Final    MODERATE WBC PRESENT,BOTH PMN AND MONONUCLEAR NO ORGANISMS SEEN Performed at [facility]    Report Status 02/29/2016 FINAL  Final  Culture, body fluid-bottle     Status: None   Collection Time: 02/29/16  2:44 PM  Result Value Ref Range Status   Specimen Description FLUID RIGHT PLEURAL COLLECTED BY DOCTOR  Final   Special Requests BOTTLES DRAWN AEROBIC AND ANAEROBIC 10CC  Final   Culture NO GROWTH 5 DAYS  Final   Report Status 03/05/2016 FINAL  Final  Surgical PCR screen     Status: Abnormal   Collection Time: 03/06/16 10:17 PM  Result Value Ref Range Status   MRSA, PCR POSITIVE (A) NEGATIVE Final    Comment: RESULT CALLED TO, READ BACK BY AND VERIFIED WITH: STOWE,S RN 0310 03/07/16 MITCHELL,L    Staphylococcus aureus POSITIVE (A) NEGATIVE Final    Comment:        The Xpert SA Assay (FDA approved for NASAL specimens in patients over 77 years of age), is one component of a comprehensive surveillance program.  Test performance has been validated by [facility] for patients greater than or equal to 35 year old. It is not intended to diagnose infection nor to guide or monitor treatment.   Culture, body fluid-bottle     Status: None (Preliminary result)   Collection Time: 03/07/16  9:55 AM  Result Value Ref Range Status    Specimen Description FLUID RIGHT PLEURAL  Final   Special Requests PATIENT ON FOLLOWING  VANCOMYCIN  Final   Culture NO GROWTH 2 DAYS  Final   Report Status PENDING  Incomplete  Gram stain     Status: None   Collection Time: 03/07/16  9:55 AM  Result Value Ref Range Status   Specimen Description  FLUID RIGHT PLEURAL  Final   Special Requests PATIENT ON FOLLOWING  VANCOMYCIN  Final   Gram Stain   Final    ABUNDANT WBC PRESENT,BOTH PMN AND MONONUCLEAR NO ORGANISMS SEEN    Report Status 03/07/2016 FINAL  Final  Aerobic/Anaerobic Culture (surgical/deep wound)     Status: None (Preliminary result)   Collection Time: 03/07/16 10:04 AM  Result Value Ref Range Status   Specimen Description TISSUE RIGHT PLEURAL  Final   Special Requests   Final    RIGHT PLEURAL PEEL SPECIMEN C PATIENT ON FOLLOWING  VANCOMYCIN   Gram Stain   Final    MODERATE WBC PRESENT,BOTH PMN AND MONONUCLEAR NO ORGANISMS SEEN    Culture NO GROWTH 2 DAYS  Final   Report Status PENDING  Incomplete         Radiology Studies: Dg Chest Port 1 View  Result Date: 03/09/2016 CLINICAL DATA:  75 year old male with acute respiratory failure and shortness of breath EXAM: PORTABLE CHEST 1 VIEW COMPARISON:  Prior chest x-ray 03/08/2016 FINDINGS: Patient has been extubated. Gastric tube is been removed. Stable position of right IJ central venous catheter with the tip overlying the mid SVC. Two right-sided chest tubes remain in place. The patient is significantly rotated toward the right. This distorts the cardiac and mediastinal contours. Overall, the degree of cardiomegaly remains unchanged. Persistent patchy airspace opacity throughout the right mid and lower lung. Blunting of the costophrenic angle again noted. There is some elevation the right hemidiaphragm. Mild atelectasis in the left lung base is slightly increased compared to prior. IMPRESSION: 1. Slightly increased left lower lobe subsegmental atelectasis. 2. Interval  extubation and removal of gastric tube. Other support apparatus remain in unchanged position. 3. Similar changes in the right lung with small residual pleural effusion versus chronic pleural thickening and atelectasis/infiltrate in the right mid and lower lung. Electronically Signed   By: Jacqulynn Cadet M.D.   On: 03/09/2016 09:09        Scheduled Meds: . acetaminophen  1,000 mg Oral Q6H   Or  . acetaminophen (TYLENOL) oral liquid 160 mg/5 mL  1,000 mg Oral Q6H  . atorvastatin  10 mg Per Tube q1800  . bisacodyl  10 mg Oral Daily  . chlorhexidine gluconate (MEDLINE KIT)  15 mL Mouth Rinse BID  . Chlorhexidine Gluconate Cloth  6 each Topical Daily  . insulin aspart  0-24 Units Subcutaneous Q4H  . insulin glargine  20 Units Subcutaneous Daily  . levofloxacin (LEVAQUIN) IV  750 mg Intravenous Q24H  . mouth rinse  15 mL Mouth Rinse QID  . mupirocin ointment  1 application Nasal BID  . pregabalin  150 mg Oral BID  . senna-docusate  1 tablet Oral QHS  . sodium chloride flush  10-40 mL Intracatheter Q12H   Continuous Infusions: . sodium chloride 100 mL/hr at 03/10/16 0240  . phenylephrine (NEO-SYNEPHRINE) Adult infusion Stopped (03/08/16 2230)     LOS: 21 days    Time spent: 40 minutes    WOODS, Geraldo Docker, MD [facility] Pager [phone]   If 7PM-7AM, please contact night-coverage [email or web address] Password [email or web address] 03/10/2016, 8:12 AM

## 2016-03-10 NOTE — Progress Notes (Signed)
Patient is poor historian, but is adamant that he does not use nocturnal CPAP/BiPAP at home and has never had a sleep study. He does not wish to use a nocturnal mask at this time either. Education provided. RT will continue to follow.

## 2016-03-10 NOTE — Evaluation (Signed)
Clinical/Bedside Swallow Evaluation Patient Details  Name: Richard Fields MRN: [id number] Date of Birth: 1940-07-24  Today's Date: 03/10/2016 Time: SLP Start Time (ACUTE ONLY): 0919 SLP Stop Time (ACUTE ONLY): 0934 SLP Time Calculation (min) (ACUTE ONLY): 15 min  Past Medical History:  Past Medical History:  Diagnosis Date  . Cervical disc disease   . Chronic constipation   . Diabetes mellitus without complication ([place])    Borderline diabetic-diet controlled  . Hyperlipidemia   . Hypertension   . Peripheral neuropathy [place])    Past Surgical History:  Past Surgical History:  Procedure Laterality Date  . COLONOSCOPY  2003   RMR: internal hemorrhoids, otherwise normal  . COLONOSCOPY  05/30/2012   Procedure: COLONOSCOPY;  Surgeon: Daneil Dolin, MD;  [facility];  Service: Endoscopy;  Laterality: N/A;  12:00  . FLEXIBLE BRONCHOSCOPY N/A 03/07/2016   Procedure: FLEXIBLE VIDEO BRONCHOSCOPY;  Surgeon: Gaye Pollack, MD;  Location: [place];  Service: Thoracic;  Laterality: N/A;  . HERNIA REPAIR    . TONSILLECTOMY    . VIDEO ASSISTED THORACOSCOPY (VATS)/THOROCOTOMY Right 03/07/2016   Procedure: VIDEO ASSISTED THORACOSCOPY (VATS);  Surgeon: Gaye Pollack, MD;  Location: [facility];  Service: Thoracic;  Laterality: Right;   HPI:  75 yo remote smoker with DM with severe peripheral neuropathy, hypertension and hyperlipidemia who was admitted to [facility] 02/18/2016 with RLL cavitary pneumonia. Patient is unreliable historian but family and friend deny known baseline lung disease. Thoracentesis 10/30- bloody fluid, culture neg. CT 110/30- large R effusion significantly compressing R lung. Pt intubated 11/6 for bronchoscopy and extubated 11/7.   Assessment / Plan / Recommendation Clinical Impression  Pt seen post 1 day intubation. He consumed trials of thin liquids, pureed and regular solids, and whole pills with thin liquid via straw. He had a single event of delayed coughing. His  voice was soft, but remained clear throughout PO intake. Pt had missing dentition and needed total assist for feeding. Given bedside performance and his pleasantly confused mentation, recommend Dys 3 and thin liquids. Will follow for diet tolerance and advancement.    Aspiration Risk  Mild aspiration risk    Diet Recommendation Dysphagia 3 (Mech soft);Thin liquid   Liquid Administration via: Cup;Straw Medication Administration: Whole meds with liquid Supervision: Full supervision/cueing for compensatory strategies;Staff to assist with self feeding Compensations: Minimize environmental distractions;Slow rate;Small sips/bites Postural Changes: Remain upright for at least 30 minutes after po intake;Seated upright at 90 degrees    Other  Recommendations Oral Care Recommendations: Oral care BID   Follow up Recommendations Other (comment) (tba)      Frequency and Duration min 2x/week  2 weeks       Prognosis Prognosis for Safe Diet Advancement: Good      Swallow Study   General HPI: 75 yo remote smoker with DM with severe peripheral neuropathy, hypertension and hyperlipidemia who was admitted to [facility] 02/18/2016 with RLL cavitary pneumonia. Patient is unreliable historian but family and friend deny known baseline lung disease. Thoracentesis 10/30- bloody fluid, culture neg. CT 110/30- large R effusion significantly compressing R lung. Pt intubated 11/6 for bronchoscopy and extubated 11/7. Type of Study: Bedside Swallow Evaluation Previous Swallow Assessment: BSE on 02/24/16 Diet Prior to this Study: NPO Temperature Spikes Noted: No Respiratory Status: Nasal cannula History of Recent Intubation: Yes Length of Intubations (days): 1 days Date extubated: 03/08/16 Behavior/Cognition: Cooperative;Pleasant mood;Confused Oral Care Completed by SLP: No Oral Cavity - Dentition: Missing dentition Self-Feeding Abilities: Total  assist Patient Positioning: Upright in bed Baseline Vocal  Quality: Other (comment) (soft)    Oral/Motor/Sensory Function Overall Oral Motor/Sensory Function: Within functional limits   Ice Chips Ice chips: Not tested   Thin Liquid Thin Liquid: Impaired Presentation: Cup;Straw Pharyngeal  Phase Impairments: Cough - Delayed    Nectar Thick Nectar Thick Liquid: Not tested   Honey Thick Honey Thick Liquid: Not tested   Puree Puree: Within functional limits Presentation: Spoon   Solid   GO   Solid: Within functional limits       Ezekiel Slocumb, Student SLP  Shela Leff 03/10/2016,10:19 AM

## 2016-03-10 NOTE — Progress Notes (Signed)
3 Days Post-Op Procedure(s) (LRB): FLEXIBLE VIDEO BRONCHOSCOPY (N/A) VIDEO ASSISTED THORACOSCOPY (VATS) (Right) Subjective: No complaints  Objective: Vital signs in last 24 hours: Temp:  [98.4 F (36.9 C)-100.3 F (37.9 C)] 98.7 F (37.1 C) (11/09 0735) Pulse Rate:  [79-118] 81 (11/09 0700) Cardiac Rhythm: Normal sinus rhythm (11/09 0400) Resp:  [17-32] 23 (11/09 0700) BP: (102-189)/(54-86) 113/69 (11/09 0700) SpO2:  [90 %-100 %] 97 % (11/09 0700)  Hemodynamic parameters for last 24 hours: CVP:  [4 mmHg-18 mmHg] 4 mmHg  Intake/Output from previous day: 11/08 0701 - 11/09 0700 In: 3000 [I.V.:2600; IV Piggyback:400] Out: 2385 [Urine:2245; Chest Tube:140] Intake/Output this shift: No intake/output data recorded.  General appearance: alert, cooperative and slowed mentation Heart: regular rate and rhythm, S1, S2 normal, no murmur, click, rub or gallop Lungs: diminished breath sounds RLL chest tube output low  Lab Results:  Recent Labs  03/09/16 0415 03/10/16 0537  WBC 12.8* 13.6*  HGB 8.1* 8.3*  HCT 26.3* 27.0*  PLT 252 275   BMET:  Recent Labs  03/09/16 0415 03/10/16 0537  NA 138 137  K 3.3* 3.6  CL 107 107  CO2 24 26  GLUCOSE 124* 127*  BUN 8 6  CREATININE 0.61 0.57*  CALCIUM 7.4* 7.4*    PT/INR: No results for input(s): LABPROT, INR in the last 72 hours. ABG    Component Value Date/Time   PHART 7.426 03/08/2016 1233   HCO3 27.1 03/08/2016 1233   TCO2 28 03/08/2016 1233   O2SAT 97.0 03/08/2016 1233   CBG (last 3)   Recent Labs  03/09/16 2356 03/10/16 0356 03/10/16 0728  GLUCAP 103* 118* 130*    Assessment/Plan: S/P Procedure(s) (LRB): FLEXIBLE VIDEO BRONCHOSCOPY (N/A) VIDEO ASSISTED THORACOSCOPY (VATS) (Right)  Chest tube output is low. Will remove anterior chest tube today and posterior tube tomorrow.   LOS: 21 days    Gaye Pollack 03/10/2016

## 2016-03-11 ENCOUNTER — Inpatient Hospital Stay (HOSPITAL_COMMUNITY): Payer: Medicare Other

## 2016-03-11 LAB — CBC WITH DIFFERENTIAL/PLATELET
BASOS ABS: 0 10*3/uL (ref 0.0–0.1)
Basophils Relative: 0 %
Eosinophils Absolute: 0.5 10*3/uL (ref 0.0–0.7)
Eosinophils Relative: 6 %
HEMATOCRIT: 23.1 % — AB (ref 39.0–52.0)
HEMOGLOBIN: 7.4 g/dL — AB (ref 13.0–17.0)
LYMPHS ABS: 1.1 10*3/uL (ref 0.7–4.0)
LYMPHS PCT: 12 %
MCH: 29.8 pg (ref 26.0–34.0)
MCHC: 32 g/dL (ref 30.0–36.0)
MCV: 93.1 fL (ref 78.0–100.0)
Monocytes Absolute: 0.4 10*3/uL (ref 0.1–1.0)
Monocytes Relative: 5 %
NEUTROS ABS: 7.1 10*3/uL (ref 1.7–7.7)
Neutrophils Relative %: 77 %
PLATELETS: 233 10*3/uL (ref 150–400)
RBC: 2.48 MIL/uL — AB (ref 4.22–5.81)
RDW: 15.9 % — ABNORMAL HIGH (ref 11.5–15.5)
WBC: 9.1 10*3/uL (ref 4.0–10.5)

## 2016-03-11 LAB — CBC
HEMATOCRIT: 24.3 % — AB (ref 39.0–52.0)
HEMOGLOBIN: 7.7 g/dL — AB (ref 13.0–17.0)
MCH: 29.3 pg (ref 26.0–34.0)
MCHC: 31.7 g/dL (ref 30.0–36.0)
MCV: 92.4 fL (ref 78.0–100.0)
Platelets: 249 10*3/uL (ref 150–400)
RBC: 2.63 MIL/uL — ABNORMAL LOW (ref 4.22–5.81)
RDW: 15.6 % — AB (ref 11.5–15.5)
WBC: 7.8 10*3/uL (ref 4.0–10.5)

## 2016-03-11 LAB — BASIC METABOLIC PANEL
Anion gap: 4 — ABNORMAL LOW (ref 5–15)
BUN: 6 mg/dL (ref 6–20)
CHLORIDE: 106 mmol/L (ref 101–111)
CO2: 29 mmol/L (ref 22–32)
Calcium: 7.2 mg/dL — ABNORMAL LOW (ref 8.9–10.3)
Creatinine, Ser: 0.55 mg/dL — ABNORMAL LOW (ref 0.61–1.24)
GFR calc non Af Amer: >60 mL/min (ref >60)
Glucose, Bld: 148 mg/dL — ABNORMAL HIGH (ref 65–99)
POTASSIUM: 3 mmol/L — AB (ref 3.5–5.1)
SODIUM: 139 mmol/L (ref 135–145)

## 2016-03-11 LAB — GLUCOSE, CAPILLARY
Glucose-Capillary: 133 mg/dL — ABNORMAL HIGH (ref 65–99)
Glucose-Capillary: 132 mg/dL — ABNORMAL HIGH (ref 65–99)
Glucose-Capillary: 123 mg/dL — ABNORMAL HIGH (ref 65–99)
Glucose-Capillary: 165 mg/dL — ABNORMAL HIGH (ref 65–99)
Glucose-Capillary: 106 mg/dL — ABNORMAL HIGH (ref 65–99)

## 2016-03-11 LAB — OCCULT BLOOD X 1 CARD TO LAB, STOOL: FECAL OCCULT BLD: POSITIVE — AB

## 2016-03-11 LAB — MAGNESIUM: MAGNESIUM: 1.8 mg/dL (ref 1.7–2.4)

## 2016-03-11 MED ORDER — ATORVASTATIN CALCIUM 10 MG PO TABS
10.0000 mg | ORAL_TABLET | Freq: Every day | ORAL | Status: DC
  Administered 2016-03-11 – 2016-03-14 (×4): 10 mg via ORAL
  Filled 2016-03-11 (×4): qty 1

## 2016-03-11 MED ORDER — IPRATROPIUM-ALBUTEROL 0.5-2.5 (3) MG/3ML IN SOLN
3.0000 mL | Freq: Three times a day (TID) | RESPIRATORY_TRACT | Status: DC
  Administered 2016-03-11 – 2016-03-13 (×9): 3 mL via RESPIRATORY_TRACT
  Filled 2016-03-11: qty 30
  Filled 2016-03-11 (×8): qty 3

## 2016-03-11 MED ORDER — OXYCODONE HCL 5 MG PO TABS
5.0000 mg | ORAL_TABLET | ORAL | Status: DC | PRN
  Administered 2016-03-12 – 2016-03-15 (×7): 10 mg via ORAL
  Filled 2016-03-11 (×8): qty 2

## 2016-03-11 MED ORDER — SODIUM CHLORIDE 0.9 % IV SOLN
INTRAVENOUS | Status: DC
  Administered 2016-03-11: 18:00:00 via INTRAVENOUS

## 2016-03-11 MED ORDER — INSULIN ASPART 100 UNIT/ML ~~LOC~~ SOLN
0.0000 [IU] | Freq: Three times a day (TID) | SUBCUTANEOUS | Status: DC
  Administered 2016-03-11: 2 [IU] via SUBCUTANEOUS
  Administered 2016-03-12: 1 [IU] via SUBCUTANEOUS
  Administered 2016-03-12 – 2016-03-13 (×2): 2 [IU] via SUBCUTANEOUS
  Administered 2016-03-13 – 2016-03-14 (×3): 1 [IU] via SUBCUTANEOUS
  Administered 2016-03-14 – 2016-03-15 (×3): 2 [IU] via SUBCUTANEOUS

## 2016-03-11 MED ORDER — INSULIN ASPART 100 UNIT/ML ~~LOC~~ SOLN: 0.0000 [IU] | Freq: Every day | SUBCUTANEOUS | Status: DC

## 2016-03-11 NOTE — Progress Notes (Signed)
[place] TEAM 1 - Stepdown/ICU TEAM  KAID SEEBERGER  LKT:[id number] DOB: 1940-09-22 DOA: 02/18/2016 PCP: Asencion Noble, MD    Brief Narrative:  75 y.o.M Hx DM2, severe peripheral neuropathy, HLD, and HTN who presented by ambulance with complaints of blackish/brownish hemoptysis intermittently for 1 month. Very poor historian. He hadassociated right shoulder pain and profusediaphoresis. In the ED he was diagnosed with a cavitary pneumonia.   Significant Events: 11/5 PCCM evaluated  11/6 right chest VATS, remained hypoxic / vented  Subjective: The patient's nurse reports that he passed a melanotic stool this morning.  There previously was some concern of a GI bleed when patient was at [facility].  He denies abdominal pain chest pain shortness breath fevers chills nausea or vomiting.  Overall he states he is feeling better.  Assessment & Plan:  Cavitary Pneumonia w/ associated Large R pleural effusion - Hydro/Hemothorax -11/6 right chest VATS and flex bronch - care per TCTS - last chest tube to be removed today - to complete 8 days of antibiotic therapy  Acute Hypoxic respiratory failure -Resolved  Melanotic stool Send for testing - follow hemoglobin serial fashion - discontinue subcutaneous heparin  Normocytic anemia  Follow hemoglobin serially  Recent Labs Lab 03/07/16 1155 03/08/16 0441 03/09/16 0415 03/10/16 0537 03/11/16 0440  HGB 9.5* 8.6* 8.1* 8.3* 7.4*    COPD -well compensated at this time  OSA/OHS -pt denies having this hx and has been refusing CPAP/BIPAP  Atrial fibrillation -likely secondary to acute stress of severe infetion - currently in NSR  Hypotension -Resolved  Hypokalemia -replace to goal of 4.0   Diabetes type 2 uncontrolled with, complications   -34/28 J6O 8.7 - CBG currently well controlled   Morbid Obesity - Body mass index is 41.72 kg/m.   MRSA screen +   DVT prophylaxis: SCDs Code Status: FULL CODE Family  Communication: no family present at time of exam  Disposition Plan: SDU  Consultants:  PCCM  TCTS  Antimicrobials:  Cefepime 11/5>11/8 Primaxin 10/19>10/31 Zosyn 11/5 vanc 10/22 >10/31: 11/5>11/8 Flagyl 11/5>11/8 Ceftriaxone 11/8> Levaquin 10/31>11/5: 11/8 > Fluconazole 11/5>11/5   Objective: Blood pressure (!) 147/73, pulse 81, temperature 99.9 F (37.7 C), temperature source Axillary, resp. rate (!) 24, height _0  (1.803 m), weight 135.7 kg (299 lb 2.6 oz), SpO2 94 %.  Intake/Output Summary (Last 24 hours) at 03/11/16 1508 Last data filed at 03/11/16 0700  Gross per 24 hour  Intake             1185 ml  Output             2115 ml  Net             -930 ml   Filed Weights   03/03/16 0600 03/04/16 0314 03/06/16 0500  Weight: (!) 140.5 kg (309 lb 11.9 oz) (!) 137.9 kg (304 lb 0.2 oz) 135.7 kg (299 lb 2.6 oz)    Examination: General: No acute respiratory distress Lungs: Clear to auscultation bilaterally without wheezes or crackles Cardiovascular: Regular rate and rhythm without murmur gallop or rub normal S1 and S2 Abdomen: Nontender, Obese, soft, bowel sounds positive, no rebound, no ascites, no appreciable mass Extremities: No significant cyanosis, clubbing, or edema bilateral lower extremities  CBC:  Recent Labs Lab 03/07/16 0539 03/07/16 1155 03/08/16 0441 03/09/16 0415 03/10/16 0537 03/11/16 0440  WBC 10.2  --  11.9* 12.8* 13.6* 9.1  NEUTROABS 8.1*  --   --   --   --  7.1  HGB 8.2* 9.5* 8.6* 8.1* 8.3* 7.4*  HCT 26.7* 28.0* 27.3* 26.3* 27.0* 23.1*  MCV 94.0  --  92.5 93.9 95.1 93.1  PLT 322  --  283 252 275 537   Basic Metabolic Panel:  Recent Labs Lab 03/07/16 0539 03/07/16 1155 03/08/16 0441 03/09/16 0415 03/10/16 0537 03/11/16 0440  NA 136 136 137 138 137 139  K 4.0 4.4 4.1 3.3* 3.6 3.0*  CL 101  --  103 107 107 106  CO2 29  --  _0 GLUCOSE 162* 239* 181* 124* 127* 148*  BUN 9  --  _1 CREATININE 0.65  --  0.63 0.61 0.57*  0.55*  CALCIUM 7.7*  --  7.6* 7.4* 7.4* 7.2*  MG  --   --   --   --   --  1.8   GFR: Estimated Creatinine Clearance: 108.8 mL/min (by C-G formula based on SCr of 0.55 mg/dL (L)).  Liver Function Tests:  Recent Labs Lab 03/06/16 0456 03/07/16 0539 03/08/16 0441 03/09/16 0415 03/10/16 0537  AST 13* _2 ALT 15* 18 16* 16* 17  ALKPHOS 78 69 70 65 74  BILITOT 0.8 0.8 0.9 1.1 0.6  PROT 5.0* 5.0* 4.6* 4.4* 4.5*  ALBUMIN 1.9* 1.8* 1.8* 1.6* 1.5*    Coagulation Profile:  Recent Labs Lab 03/06/16 1345  INR 1.17    Cardiac Enzymes:  Recent Labs Lab 03/05/16 1101 03/05/16 1536 03/05/16 2250  TROPONINI 0.13* 0.11* 0.12*    HbA1C: Hgb A1c MFr Bld  Date/Time Value Ref Range Status  02/18/2016 05:17 AM 8.7 (H) 4.8 - 5.6 % Final    Comment:    (NOTE)         Pre-diabetes: 5.7 - 6.4         Diabetes: >6.4         Glycemic control for adults with diabetes: <7.0     CBG:  Recent Labs Lab 03/10/16 1912 03/10/16 2325 03/11/16 0315 03/11/16 0729 03/11/16 1250  GLUCAP 102* 126* 133* 132* 123*    Recent Results (from the past 240 hour(s))  Surgical PCR screen     Status: Abnormal   Collection Time: 03/06/16 10:17 PM  Result Value Ref Range Status   MRSA, PCR POSITIVE (A) NEGATIVE Final    Comment: RESULT CALLED TO, READ BACK BY AND VERIFIED WITH: STOWE,S RN 0310 03/07/16 MITCHELL,L    Staphylococcus aureus POSITIVE (A) NEGATIVE Final    Comment:        The Xpert SA Assay (FDA approved for NASAL specimens in patients over 93 years of age), is one component of a comprehensive surveillance program.  Test performance has been validated by [facility] for patients greater than or equal to 70 year old. It is not intended to diagnose infection nor to guide or monitor treatment.   Culture, body fluid-bottle     Status: None (Preliminary result)   Collection Time: 03/07/16  9:55 AM  Result Value Ref Range Status   Specimen Description FLUID RIGHT  PLEURAL  Final   Special Requests PATIENT ON FOLLOWING  VANCOMYCIN  Final   Culture NO GROWTH 4 DAYS  Final   Report Status PENDING  Incomplete  Gram stain     Status: None   Collection Time: 03/07/16  9:55 AM  Result Value Ref Range Status   Specimen Description FLUID RIGHT PLEURAL  Final   Special Requests PATIENT ON FOLLOWING  VANCOMYCIN  Final  Gram Stain   Final    ABUNDANT WBC PRESENT,BOTH PMN AND MONONUCLEAR NO ORGANISMS SEEN    Report Status 03/07/2016 FINAL  Final  Aerobic/Anaerobic Culture (surgical/deep wound)     Status: None (Preliminary result)   Collection Time: 03/07/16 10:04 AM  Result Value Ref Range Status   Specimen Description TISSUE RIGHT PLEURAL  Final   Special Requests   Final    RIGHT PLEURAL PEEL SPECIMEN C PATIENT ON FOLLOWING  VANCOMYCIN   Gram Stain   Final    MODERATE WBC PRESENT,BOTH PMN AND MONONUCLEAR NO ORGANISMS SEEN    Culture NO GROWTH 4 DAYS  Final   Report Status PENDING  Incomplete     Scheduled Meds: . acetaminophen  1,000 mg Oral Q6H   Or  . acetaminophen (TYLENOL) oral liquid 160 mg/5 mL  1,000 mg Oral Q6H  . atorvastatin  10 mg Per Tube q1800  . bisacodyl  10 mg Oral Daily  . chlorhexidine gluconate (MEDLINE KIT)  15 mL Mouth Rinse BID  . Chlorhexidine Gluconate Cloth  6 each Topical Daily  . insulin aspart  0-24 Units Subcutaneous Q4H  . insulin glargine  20 Units Subcutaneous Daily  . ipratropium-albuterol  3 mL Nebulization TID  . levofloxacin (LEVAQUIN) IV  750 mg Intravenous Q24H  . mouth rinse  15 mL Mouth Rinse QID  . mupirocin ointment  1 application Nasal BID  . pregabalin  150 mg Oral BID  . senna-docusate  1 tablet Oral QHS  . sodium chloride flush  10-40 mL Intracatheter Q12H   Continuous Infusions: . dextrose 5 % and 0.9% NaCl 100 mL/hr at 03/10/16 2300  . phenylephrine (NEO-SYNEPHRINE) Adult infusion Stopped (03/08/16 2230)     LOS: 22 days   Cherene Altes, MD [facility] Office   [phone] Pager - Text Page per Amion as per below:  On-Call/Text Page:      [email or web address]      password [email or web address]  If 7PM-7AM, please contact night-coverage [email or web address] Password [email or web address] 03/11/2016, 3:08 PM

## 2016-03-11 NOTE — Discharge Instructions (Signed)
,   Care After °Refer to this sheet in the next few weeks. These instructions provide you with information on caring for yourself after your procedure. Your caregiver may also give you more specific instructions. Your procedure has been planned according to current medical practices, but problems sometimes occur. Call your caregiver if you have any problems or questions after your procedure. °HOME CARE INSTRUCTIONS  °· Only take over-the-counter or prescription medications as directed. °· Only take pain medications (narcotics) as directed. °· Do not drive until your caregiver approves. Driving while taking narcotics or soon after surgery can be dangerous, so discuss the specific timing with your caregiver. °· Avoid activities that use your chest muscles, such as lifting heavy objects, for at least 3-4 weeks.   °· Take deep breaths to expand the lungs and to protect against pneumonia. °· Do breathing exercises as directed by your caregiver. If you were given an incentive spirometer to help with breathing, use it as directed. °· You may resume a normal diet and activities when you feel you are able to or as directed. °· Do not take a bath until your caregiver says it is OK. Use the shower instead.   °· Keep the bandage (dressing) covering the area where the chest tube was inserted (incision site) dry for 48 hours. After 48 hours, remove the dressing unless there is new drainage. °· Remove dressings as directed by your caregiver. °· Change dressings if necessary or as directed. °· Keep all follow-up appointments. It is important for you to see your caregiver after surgery to discuss appropriate follow-up care and surveillance, if it is necessary. °SEEK MEDICAL CARE: °· You feel excessive or increasing pain at an incision site. °· You notice bleeding, skin irritation, drainage, swelling, or redness at an incision site. °· There is a bad smell coming from an incision or dressing. °· It feels like your heart is fluttering  or beating rapidly. °· Your pain medication does not relieve your pain. °SEEK IMMEDIATE MEDICAL CARE IF:  °· You have a fever.   °· You have chest pain.  °· You have a rash. °· You have shortness of breath. °· You have trouble breathing.   °· You feel weak, lightheaded, dizzy, or faint.   °MAKE SURE YOU:  °· Understand these instructions.   °· Will watch your condition.   °· Will get help right away if you are not doing well or get worse. °  °This information is not intended to replace advice given to you by your health care provider. Make sure you discuss any questions you have with your health care provider. °  °Document Released: 08/13/2012 Document Revised: 05/09/2014 Document Reviewed: 08/13/2012 °Elsevier Interactive Patient Education ©2016 [employer] Inc[employer] ° °

## 2016-03-11 NOTE — Progress Notes (Signed)
Speech Language Pathology Treatment: Dysphagia  Patient Details Name: Richard Fields MRN: [id number] DOB: [birth year] Today's Date: 03/11/2016 Time: [id number] SLP Time Calculation (min) (ACUTE ONLY): 12 min  Assessment / Plan / Recommendation Clinical Impression  Pt alert and cooperative during treatment. Pt reports no problems swallowing. He was given several trials of graham cracker and ice water in cup with straw. Pt had no overt signs or symptoms of aspiration. No clearing or coughing. Plan to continue current diet of Dys 3, thin liquids. Pt request to continue with assistance for meals. "He likes to be fed." Will monitor pt for diet tolerance.    HPI HPI: 75 yo remote smoker with DM with severe peripheral neuropathy, hypertension and hyperlipidemia who was admitted to [facility] 02/18/2016 with RLL cavitary pneumonia. Patient is unreliable historian but family and friend deny known baseline lung disease. Thoracentesis 10/30- bloody fluid, culture neg. CT 110/30- large R effusion significantly compressing R lung. Pt intubated 11/6 for bronchoscopy and extubated 11/7.      SLP Plan   Continue to monitor diet tolerance.    Recommendations  Diet recommendations: Dysphagia 3 (mechanical soft) Liquids provided via: Teaspoon;Cup Medication Administration: Whole meds with liquid Supervision: Staff to assist with self feeding Compensations: Minimize environmental distractions;Slow rate;Small sips/bites Postural Changes and/or Swallow Maneuvers: Seated upright 90 degrees;Upright 30-60 min after meal                        GO               Richard Fields Daryn Pisani, MA, CCC-SLP 03/11/2016 3:23 PM

## 2016-03-11 NOTE — Progress Notes (Addendum)
[address]       [place] [address]             [phone]      4 Days Post-Op Procedure(s) (LRB): FLEXIBLE VIDEO BRONCHOSCOPY (N/A) VIDEO ASSISTED THORACOSCOPY (VATS) (Right) Subjective: Feels ok, denies SOB  Objective: Vital signs in last 24 hours: Temp:  [98.6 F (37 C)-100.7 F (38.2 C)] 98.6 F (37 C) (11/10 0700) Pulse Rate:  [81-123] 81 (11/10 0700) Cardiac Rhythm: Sinus tachycardia (11/10 0005) Resp:  [21-29] 24 (11/10 0700) BP: (103-147)/(59-73) 147/73 (11/10 0700) SpO2:  [82 %-99 %] 93 % (11/10 0700)  Hemodynamic parameters for last 24 hours: CVP:  [4 mmHg-11 mmHg] 9 mmHg  Intake/Output from previous day: 11/09 0701 - 11/10 0700 In: 4132 [P.O.:60; I.V.:1275; IV Piggyback:50] Out: 2225 [Urine:2175; Chest Tube:50] Intake/Output this shift: No intake/output data recorded.  PE: Alert, NAD Lungs: dim in lower fields Cor RRR- Tachy Abd: obese, soft, nontender Incis: ok   Lab Results:  Recent Labs  03/10/16 0537 03/11/16 0440  WBC 13.6* 9.1  HGB 8.3* 7.4*  HCT 27.0* 23.1*  PLT 275 233   BMET:  Recent Labs  03/10/16 0537 03/11/16 0440  NA 137 139  K 3.6 3.0*  CL 107 106  CO2 26 29  GLUCOSE 127* 148*  BUN 6 6  CREATININE 0.57* 0.55*  CALCIUM 7.4* 7.2*    PT/INR: No results for input(s): LABPROT, INR in the last 72 hours. ABG    Component Value Date/Time   PHART 7.426 03/08/2016 1233   HCO3 27.1 03/08/2016 1233   TCO2 28 03/08/2016 1233   O2SAT 97.0 03/08/2016 1233   CBG (last 3)   Recent Labs  03/10/16 2325 03/11/16 0315 03/11/16 0729  GLUCAP 126* 133* 132*    Meds Scheduled Meds: . acetaminophen  1,000 mg Oral Q6H   Or  . acetaminophen (TYLENOL) oral liquid 160 mg/5 mL  1,000 mg Oral Q6H  . atorvastatin  10 mg Per Tube q1800  . bisacodyl  10 mg Oral Daily  . chlorhexidine gluconate (MEDLINE KIT)  15 mL Mouth Rinse BID  . Chlorhexidine Gluconate Cloth  6 each Topical Daily  . heparin  subcutaneous  5,000 Units Subcutaneous Q8H  . insulin aspart  0-24 Units Subcutaneous Q4H  . insulin glargine  20 Units Subcutaneous Daily  . ipratropium-albuterol  3 mL Nebulization TID  . levofloxacin (LEVAQUIN) IV  750 mg Intravenous Q24H  . mouth rinse  15 mL Mouth Rinse QID  . mupirocin ointment  1 application Nasal BID  . pregabalin  150 mg Oral BID  . senna-docusate  1 tablet Oral QHS  . sodium chloride flush  10-40 mL Intracatheter Q12H   Continuous Infusions: . dextrose 5 % and 0.9% NaCl 100 mL/hr at 03/10/16 2300  . phenylephrine (NEO-SYNEPHRINE) Adult infusion Stopped (03/08/16 2230)   PRN Meds:.fentaNYL (SUBLIMAZE) injection, fentaNYL (SUBLIMAZE) injection, fentaNYL (SUBLIMAZE) injection, labetalol, metoprolol, ondansetron (ZOFRAN) IV, oxyCODONE, potassium chloride, sodium chloride flush, traMADol  Xrays No results found.  Assessment/Plan: S/P Procedure(s) (LRB): FLEXIBLE VIDEO BRONCHOSCOPY (N/A) VIDEO ASSISTED THORACOSCOPY (VATS) (Right)  1 minimal drainage from CT, no air leak- D/C chest tube 2 medical management per primary   LOS: 22 days    GOLD,WAYNE E 03/11/2016   Chart reviewed, patient examined, agree with above.

## 2016-03-12 LAB — CBC
HCT: 22.2 % — ABNORMAL LOW (ref 39.0–52.0)
HCT: 26.6 % — ABNORMAL LOW (ref 39.0–52.0)
Hemoglobin: 7.1 g/dL — ABNORMAL LOW (ref 13.0–17.0)
Hemoglobin: 8.4 g/dL — ABNORMAL LOW (ref 13.0–17.0)
MCH: 29.8 pg (ref 26.0–34.0)
MCH: 29.2 pg (ref 26.0–34.0)
MCHC: 32 g/dL (ref 30.0–36.0)
MCHC: 31.6 g/dL (ref 30.0–36.0)
MCV: 93.3 fL (ref 78.0–100.0)
MCV: 92.4 fL (ref 78.0–100.0)
PLATELETS: 245 10*3/uL (ref 150–400)
PLATELETS: 244 10*3/uL (ref 150–400)
RBC: 2.38 MIL/uL — AB (ref 4.22–5.81)
RBC: 2.88 MIL/uL — ABNORMAL LOW (ref 4.22–5.81)
RDW: 15.9 % — ABNORMAL HIGH (ref 11.5–15.5)
RDW: 15.5 % (ref 11.5–15.5)
WBC: 7.2 10*3/uL (ref 4.0–10.5)
WBC: 7.4 10*3/uL (ref 4.0–10.5)

## 2016-03-12 LAB — COMPREHENSIVE METABOLIC PANEL
ALBUMIN: 1.5 g/dL — AB (ref 3.5–5.0)
ALT: 15 U/L — AB (ref 17–63)
ANION GAP: 8 (ref 5–15)
AST: 16 U/L (ref 15–41)
Alkaline Phosphatase: 79 U/L (ref 38–126)
BUN: <5 mg/dL — ABNORMAL LOW (ref 6–20)
CHLORIDE: 105 mmol/L (ref 101–111)
CO2: 27 mmol/L (ref 22–32)
CREATININE: 0.52 mg/dL — AB (ref 0.61–1.24)
Calcium: 7.3 mg/dL — ABNORMAL LOW (ref 8.9–10.3)
GFR calc non Af Amer: >60 mL/min (ref >60)
GLUCOSE: 109 mg/dL — AB (ref 65–99)
Potassium: 3 mmol/L — ABNORMAL LOW (ref 3.5–5.1)
SODIUM: 140 mmol/L (ref 135–145)
Total Bilirubin: 0.5 mg/dL (ref 0.3–1.2)
Total Protein: 4.1 g/dL — ABNORMAL LOW (ref 6.5–8.1)

## 2016-03-12 LAB — IRON AND TIBC
IRON: 13 ug/dL — AB (ref 45–182)
SATURATION RATIOS: 10 % — AB (ref 17.9–39.5)
TIBC: 134 ug/dL — ABNORMAL LOW (ref 250–450)
UIBC: 121 ug/dL

## 2016-03-12 LAB — AEROBIC/ANAEROBIC CULTURE W GRAM STAIN (SURGICAL/DEEP WOUND)

## 2016-03-12 LAB — RETICULOCYTES
RBC.: 2.38 MIL/uL — ABNORMAL LOW (ref 4.22–5.81)
Retic Count, Absolute: 97.6 10*3/uL (ref 19.0–186.0)
Retic Ct Pct: 4.1 % — ABNORMAL HIGH (ref 0.4–3.1)

## 2016-03-12 LAB — PREPARE RBC (CROSSMATCH)

## 2016-03-12 LAB — PROTIME-INR
INR: 1.35
Prothrombin Time: 16.8 seconds — ABNORMAL HIGH (ref 11.4–15.2)

## 2016-03-12 LAB — CULTURE, BODY FLUID-BOTTLE: CULTURE: NO GROWTH

## 2016-03-12 LAB — AEROBIC/ANAEROBIC CULTURE (SURGICAL/DEEP WOUND): CULTURE: NO GROWTH

## 2016-03-12 LAB — GLUCOSE, CAPILLARY
GLUCOSE-CAPILLARY: 115 mg/dL — AB (ref 65–99)
GLUCOSE-CAPILLARY: 168 mg/dL — AB (ref 65–99)
Glucose-Capillary: 148 mg/dL — ABNORMAL HIGH (ref 65–99)
Glucose-Capillary: 171 mg/dL — ABNORMAL HIGH (ref 65–99)

## 2016-03-12 LAB — VITAMIN B12: Vitamin B-12: 663 pg/mL (ref 180–914)

## 2016-03-12 LAB — FERRITIN: Ferritin: 159 ng/mL (ref 24–336)

## 2016-03-12 LAB — APTT: APTT: 35 s (ref 24–36)

## 2016-03-12 LAB — FOLATE: FOLATE: 9 ng/mL (ref >5.9)

## 2016-03-12 LAB — CULTURE, BODY FLUID W GRAM STAIN -BOTTLE

## 2016-03-12 MED ORDER — POTASSIUM CHLORIDE CRYS ER 20 MEQ PO TBCR
40.0000 meq | EXTENDED_RELEASE_TABLET | Freq: Once | ORAL | Status: AC
  Administered 2016-03-12: 40 meq via ORAL
  Filled 2016-03-12: qty 2

## 2016-03-12 MED ORDER — SODIUM CHLORIDE 0.9 % IV SOLN
Freq: Once | INTRAVENOUS | Status: AC
  Administered 2016-03-12: 13:00:00 via INTRAVENOUS

## 2016-03-12 NOTE — Progress Notes (Signed)
[place] TEAM 1 - Stepdown/ICU TEAM  Richard Fields  OLM:[id number] DOB: [birth year] DOA: 02/18/2016 PCP: Asencion Noble, MD    Brief Narrative:  75 y.o.M Hx DM2, severe peripheral neuropathy, HLD, and HTN who presented by ambulance with complaints of blackish/brownish hemoptysis intermittently for 1 month. Very poor historian. He hadassociated right shoulder pain and profusediaphoresis. In the ED he was diagnosed with a cavitary pneumonia.   Significant Events: 11/5 PCCM evaluated  11/6 right chest VATS, remained hypoxic / vented  Subjective: The patient had at least 3 episodes of melanotic stools yesterday afternoon/evening.  He is presently tachycardic though his blood pressure is stable.  His hemoglobin has been on a slow downward trend.  He is resting comfortably in bed at present.  He is not aware of his melanotic stools.  He denies chest pain shortness breath fevers chills nausea or vomiting.  Assessment & Plan:  Cavitary Pneumonia w/ associated Large R pleural effusion - Hydro/Hemothorax 11/6 right chest VATS and flex bronch - care per TCTS - d/c abx after dose today    Acute Hypoxic respiratory failure Resolved  Melanotic stool subcutaneous heparin discontinued - this issue was originally noted while the patient was still at [facility] but was self-limited at that time - follow for now but when more stable medically will likely need EGD plus/minus colonoscopy  Normocytic anemia - acute blood loss anemia With hemoglobin drifting down and evidence of active blood loss will transfuse 1 unit to assure hemoglobin remains 7.0 or >  Recent Labs Lab 03/09/16 0415 03/10/16 0537 03/11/16 0440 03/11/16 1826 03/12/16 0600  HGB 8.1* 8.3* 7.4* 7.7* 7.1*    COPD well compensated at this time  OSA/OHS pt denies having this hx and has been refusing CPAP/BIPAP  Atrial fibrillation likely secondary to acute stress of severe infetion - currently in NSR - not a candidate for  anticoagulation due to GI bleeding  Hypotension Resolved  Hypokalemia Cont to replace to goal of 4.0   Diabetes type 2 uncontrolled with, complications   01/00 F1Q 8.7 - CBG controlled   Morbid Obesity - Body mass index is 41.72 kg/m.   MRSA screen +   DVT prophylaxis: SCDs Code Status: FULL CODE Family Communication: no family present at time of exam  Disposition Plan: SDU  Consultants:  PCCM  TCTS  Antimicrobials:  Cefepime 11/5>11/8 Primaxin 10/19>10/31 Zosyn 11/5 vanc 10/22 >10/31: 11/5>11/8 Flagyl 11/5>11/8 Ceftriaxone 11/8> Levaquin 10/31>11/5 + 11/8 > 11/11 Fluconazole 11/5>11/5   Objective: Blood pressure 122/73, pulse (!) 102, temperature 98.4 F (36.9 C), temperature source Oral, resp. rate (!) 26, height _0  (1.803 m), weight 135.7 kg (299 lb 2.6 oz), SpO2 93 %.  Intake/Output Summary (Last 24 hours) at 03/12/16 1044 Last data filed at 03/12/16 0900  Gross per 24 hour  Intake             1585 ml  Output             2450 ml  Net             -865 ml   Filed Weights   03/03/16 0600 03/04/16 0314 03/06/16 0500  Weight: (!) 140.5 kg (309 lb 11.9 oz) (!) 137.9 kg (304 lb 0.2 oz) 135.7 kg (299 lb 2.6 oz)    Examination: General: No acute respiratory distress - alert  Lungs: Clear to auscultation bilaterally without wheezes or crackles - distant breath sounds throughout Cardiovascular: Tachycardic but regular without appreciable murmur - distant heart sounds  Abdomen: Nontender, obese, soft, bowel sounds positive, no rebound, no ascites, no appreciable mass Extremities: 1+ edema bilateral lower extremities  CBC:  Recent Labs Lab 03/07/16 0539  03/09/16 0415 03/10/16 0537 03/11/16 0440 03/11/16 1826 03/12/16 0600  WBC 10.2  < > 12.8* 13.6* 9.1 7.8 7.2  NEUTROABS 8.1*  --   --   --  7.1  --   --   HGB 8.2*  < > 8.1* 8.3* 7.4* 7.7* 7.1*  HCT 26.7*  < > 26.3* 27.0* 23.1* 24.3* 22.2*  MCV 94.0  < > 93.9 95.1 93.1 92.4 93.3  PLT 322  < >  252 275 233 249 245  < > = values in this interval not displayed. Basic Metabolic Panel:  Recent Labs Lab 03/08/16 0441 03/09/16 0415 03/10/16 0537 03/11/16 0440 03/12/16 0600  NA 137 138 137 139 140  K 4.1 3.3* 3.6 3.0* 3.0*  CL 103 107 107 106 105  CO2 _0 GLUCOSE 181* 124* 127* 148* 109*  BUN _1 <5*  CREATININE 0.63 0.61 0.57* 0.55* 0.52*  CALCIUM 7.6* 7.4* 7.4* 7.2* 7.3*  MG  --   --   --  1.8  --    GFR: Estimated Creatinine Clearance: 108.8 mL/min (by C-G formula based on SCr of 0.52 mg/dL (L)).  Liver Function Tests:  Recent Labs Lab 03/07/16 0539 03/08/16 0441 03/09/16 0415 03/10/16 0537 03/12/16 0600  AST _2 ALT 18 16* 16* 17 15*  ALKPHOS 69 70 65 74 79  BILITOT 0.8 0.9 1.1 0.6 0.5  PROT 5.0* 4.6* 4.4* 4.5* 4.1*  ALBUMIN 1.8* 1.8* 1.6* 1.5* 1.5*    Coagulation Profile:  Recent Labs Lab 03/06/16 1345  INR 1.17    Cardiac Enzymes:  Recent Labs Lab 03/05/16 1536 03/05/16 2250  TROPONINI 0.11* 0.12*    HbA1C: Hgb A1c MFr Bld  Date/Time Value Ref Range Status  02/18/2016 05:17 AM 8.7 (H) 4.8 - 5.6 % Final    Comment:    (NOTE)         Pre-diabetes: 5.7 - 6.4         Diabetes: >6.4         Glycemic control for adults with diabetes: <7.0     CBG:  Recent Labs Lab 03/11/16 0729 03/11/16 1250 03/11/16 1511 03/11/16 2130 03/12/16 0839  GLUCAP 132* 123* 165* 106* 115*    Recent Results (from the past 240 hour(s))  Surgical PCR screen     Status: Abnormal   Collection Time: 03/06/16 10:17 PM  Result Value Ref Range Status   MRSA, PCR POSITIVE (A) NEGATIVE Final    Comment: RESULT CALLED TO, READ BACK BY AND VERIFIED WITH: STOWE,S RN 0310 03/07/16 MITCHELL,L    Staphylococcus aureus POSITIVE (A) NEGATIVE Final    Comment:        The Xpert SA Assay (FDA approved for NASAL specimens in patients over 34 years of age), is one component of a comprehensive surveillance program.  Test performance  has been validated by [facility] for patients greater than or equal to 55 year old. It is not intended to diagnose infection nor to guide or monitor treatment.   Culture, body fluid-bottle     Status: None   Collection Time: 03/07/16  9:55 AM  Result Value Ref Range Status   Specimen Description FLUID RIGHT PLEURAL  Final   Special Requests PATIENT ON FOLLOWING  VANCOMYCIN  Final  Culture NO GROWTH 5 DAYS  Final   Report Status 03/12/2016 FINAL  Final  Gram stain     Status: None   Collection Time: 03/07/16  9:55 AM  Result Value Ref Range Status   Specimen Description FLUID RIGHT PLEURAL  Final   Special Requests PATIENT ON FOLLOWING  VANCOMYCIN  Final   Gram Stain   Final    ABUNDANT WBC PRESENT,BOTH PMN AND MONONUCLEAR NO ORGANISMS SEEN    Report Status 03/07/2016 FINAL  Final  Aerobic/Anaerobic Culture (surgical/deep wound)     Status: None   Collection Time: 03/07/16 10:04 AM  Result Value Ref Range Status   Specimen Description TISSUE RIGHT PLEURAL  Final   Special Requests   Final    RIGHT PLEURAL PEEL SPECIMEN C PATIENT ON FOLLOWING  VANCOMYCIN   Gram Stain   Final    MODERATE WBC PRESENT,BOTH PMN AND MONONUCLEAR NO ORGANISMS SEEN    Culture NO GROWTH 5 DAYS  Final   Report Status 03/12/2016 FINAL  Final     Scheduled Meds: . sodium chloride   Intravenous Once  . acetaminophen  1,000 mg Oral Q6H   Or  . acetaminophen (TYLENOL) oral liquid 160 mg/5 mL  1,000 mg Oral Q6H  . atorvastatin  10 mg Oral q1800  . bisacodyl  10 mg Oral Daily  . chlorhexidine gluconate (MEDLINE KIT)  15 mL Mouth Rinse BID  . insulin aspart  0-5 Units Subcutaneous QHS  . insulin aspart  0-9 Units Subcutaneous TID WC  . insulin glargine  20 Units Subcutaneous Daily  . ipratropium-albuterol  3 mL Nebulization TID  . levofloxacin (LEVAQUIN) IV  750 mg Intravenous Q24H  . mouth rinse  15 mL Mouth Rinse QID  . pregabalin  150 mg Oral BID  . senna-docusate  1 tablet Oral QHS    Continuous Infusions: . sodium chloride 20 mL/hr at 03/11/16 1815     LOS: 23 days   Cherene Altes, MD [facility] Office  [phone] Pager - Text Page per Amion as per below:  On-Call/Text Page:      [email or web address]      password [email or web address]  If 7PM-7AM, please contact night-coverage [email or web address] Password [email or web address] 03/12/2016, 10:44 AM

## 2016-03-12 NOTE — Progress Notes (Signed)
Pharmacy Antibiotic Note  Richard Fields is a 75 y.o. male admitted on 02/18/2016 with empyema.  Pharmacy has been consulted for Levaquin dosing for HCAP.  Changing from Vancomycin, Cefepime and Metronidazole.   Plan: -Levaquin 750 mg IV q24hrs. -Will follow renal function, final culture data, length of therapy.  Height: 5\' 11"  (180.3 cm) Weight: 299 lb 2.6 oz (135.7 kg) IBW/kg (Calculated) : 75.3  Temp (24hrs), Avg:99.1 F (37.3 C), Min:98.3 F (36.8 C), Max:100 F (37.8 C)   Recent Labs Lab 03/08/16 0441 03/09/16 0415 03/10/16 0537 03/11/16 0440 03/11/16 1826 03/12/16 0600  WBC 11.9* 12.8* 13.6* 9.1 7.8 7.2  CREATININE 0.63 0.61 0.57* 0.55*  --  0.52*    Estimated Creatinine Clearance: 108.8 mL/min (by C-G formula based on SCr of 0.52 mg/dL (L)).    Allergies  Allergen Reactions  . Penicillins Other (See Comments)    Unknown-reaction is unknown  . Tegretol [Carbamazepine] Itching  . Cefepime Rash    Antimicrobials this admission: vanco 10/19 >10/31; 11/5 >>11/8 primaxin 10/19 > 10/31 levaquin 10/19 x 1; 10/31 >11/5; resume 11/8>> Cefepime 11/5 >>11/8 Flagyl 11/5>> 11/8 CHG/Bactroban 11/6>>(11/10)  Dose adjustments this admission:  10/21 Vanc torugh 14 mcg/ml Vancomycin increased 1gm to 1250mg  IV q8h  10/24 Vanc trough 17 mc/gml  - at goal  10/31 Vanc trough 18 mc/gml - at goal  Microbiology results: 10/19: blood x 2 - negative 10/20: MRSA PCR - positive 10/30: right pleural fluid - negative 11/5 surgical MRSA PCR - positive 11/6 tissue R pleural - no growth x 2 days so far 11/6 fluid R pleural - no growth x 2 days so far  Thank you for allowing pharmacy to be a part of this patient's care.  Myer Peer Grayland Ormond), PharmD  PGY1 Pharmacy Resident Pager: [phone] 03/12/2016 10:10 AM

## 2016-03-12 NOTE — Progress Notes (Addendum)
[address]       [place] [address]             [phone]      5 Days Post-Op Procedure(s) (LRB): FLEXIBLE VIDEO BRONCHOSCOPY (N/A) VIDEO ASSISTED THORACOSCOPY (VATS) (Right) Subjective: Feels ok Literally nothing on his breakfast tray but complex carbohydrates and sugars  Objective: Vital signs in last 24 hours: Temp:  [98.3 F (36.8 C)-100 F (37.8 C)] 98.4 F (36.9 C) (11/11 0649) Pulse Rate:  [102-109] 102 (11/11 0649) Cardiac Rhythm: Normal sinus rhythm (11/10 1930) Resp:  [22-28] 22 (11/11 0649) BP: (120-149)/(62-81) 122/73 (11/11 0649) SpO2:  [90 %-100 %] 100 % (11/11 0830)  Hemodynamic parameters for last 24 hours: CVP:  [11 mmHg] 11 mmHg  Intake/Output from previous day: 11/10 0701 - 11/11 0700 In: 1390 [P.O.:240; I.V.:900; IV Piggyback:250] Out: 2450 [Urine:2450] Intake/Output this shift: No intake/output data recorded.  General appearance: alert, appears stated age and no distress Heart: regular rate and rhythm Lungs: clear anteriorly  Lab Results:  Recent Labs  03/11/16 1826 03/12/16 0600  WBC 7.8 7.2  HGB 7.7* 7.1*  HCT 24.3* 22.2*  PLT 249 245   BMET:  Recent Labs  03/11/16 0440 03/12/16 0600  NA 139 140  K 3.0* 3.0*  CL 106 105  CO2 29 27  GLUCOSE 148* 109*  BUN 6 <5*  CREATININE 0.55* 0.52*  CALCIUM 7.2* 7.3*    PT/INR: No results for input(s): LABPROT, INR in the last 72 hours. ABG    Component Value Date/Time   PHART 7.426 03/08/2016 1233   HCO3 27.1 03/08/2016 1233   TCO2 28 03/08/2016 1233   O2SAT 97.0 03/08/2016 1233   CBG (last 3)   Recent Labs  03/11/16 1511 03/11/16 2130 03/12/16 0839  GLUCAP 165* 106* 115*    Meds Scheduled Meds: . sodium chloride   Intravenous Once  . acetaminophen  1,000 mg Oral Q6H   Or  . acetaminophen (TYLENOL) oral liquid 160 mg/5 mL  1,000 mg Oral Q6H  . atorvastatin  10 mg Oral q1800  . bisacodyl  10 mg Oral Daily  . chlorhexidine gluconate (MEDLINE  KIT)  15 mL Mouth Rinse BID  . insulin aspart  0-5 Units Subcutaneous QHS  . insulin aspart  0-9 Units Subcutaneous TID WC  . insulin glargine  20 Units Subcutaneous Daily  . ipratropium-albuterol  3 mL Nebulization TID  . levofloxacin (LEVAQUIN) IV  750 mg Intravenous Q24H  . mouth rinse  15 mL Mouth Rinse QID  . pregabalin  150 mg Oral BID  . senna-docusate  1 tablet Oral QHS   Continuous Infusions: . sodium chloride 20 mL/hr at 03/11/16 1815   PRN Meds:.fentaNYL (SUBLIMAZE) injection, labetalol, metoprolol, ondansetron (ZOFRAN) IV, oxyCODONE, sodium chloride flush, traMADol  Xrays Dg Chest Port 1 View  Result Date: 03/11/2016 CLINICAL DATA:  Post chest tube removal EXAM: PORTABLE CHEST 1 VIEW COMPARISON:  03/11/2016 FINDINGS: Right central venous catheter with tip over the mid SVC region. Left PICC line with tip over the central mediastinum, likely in the brachiocephalic vein. Interval removal of right chest tube. No significant pneumothorax. Small residual right pleural effusion with basilar atelectasis or consolidation. Infiltration or atelectasis also demonstrated in the left lung base. Mild cardiac enlargement without vascular congestion. Evaluation of mediastinum is limited due to patient rotation. IMPRESSION: Interval removal of right chest tube. No recurrent pneumothorax identified. Residual right pleural effusion and bilateral basilar atelectasis or infiltration. Electronically Signed  By: William  Stevens M.D.   On: 03/11/2016 21:15   Dg Chest Port 1 View  Result Date: 03/11/2016 CLINICAL DATA:  Onset of shortness of breath today. EXAM: PORTABLE CHEST 1 VIEW COMPARISON:  Portable chest x-ray of March 09, 2016 FINDINGS: There has been interval removal of the right upper chest tube. The pulmonary interstitial markings in both lungs remain increased. There is a persistent pleural effusion at the right lung base. The right basilar chest tube is stable in position. There is  persistent increased density in the right lower lung and to a lesser extent at the left lung base. The cardiac silhouette is enlarged and the pulmonary vascularity more engorged today. The right internal jugular venous catheter tip projects over the midportion of the SVC. IMPRESSION: Mild interval worsening in the appearance of the pulmonary interstitium consistent with interstitial edema. Cardiomegaly. Persistent right basilar atelectasis and small to moderate pleural effusion. Mild left basilar subsegmental atelectasis. No pneumothorax following removal of the upper right chest tube. Electronically Signed   By: David  Jordan M.D.   On: 03/11/2016 08:01    Assessment/Plan: S/P Procedure(s) (LRB): FLEXIBLE VIDEO BRONCHOSCOPY (N/A) VIDEO ASSISTED THORACOSCOPY (VATS) (Right)  1 stable from surgical perspective with tubes out 2 primary svc is managing medical issues F/u appt made when stable for discharge   LOS: 23 days    GOLD,WAYNE E 03/12/2016 Patient seen and examined, agree with above Repeat CXR in AM  Steven C. Hendrickson, MD [facility] [phone]  

## 2016-03-13 ENCOUNTER — Inpatient Hospital Stay (HOSPITAL_COMMUNITY): Payer: Medicare Other

## 2016-03-13 LAB — TYPE AND SCREEN
ABO/RH(D): O POS
Antibody Screen: NEGATIVE
Unit division: 0

## 2016-03-13 LAB — BASIC METABOLIC PANEL
Anion gap: 8 (ref 5–15)
CO2: 27 mmol/L (ref 22–32)
Calcium: 7.4 mg/dL — ABNORMAL LOW (ref 8.9–10.3)
Chloride: 103 mmol/L (ref 101–111)
Creatinine, Ser: 0.51 mg/dL — ABNORMAL LOW (ref 0.61–1.24)
GFR calc Af Amer: >60 mL/min (ref >60)
GLUCOSE: 154 mg/dL — AB (ref 65–99)
POTASSIUM: 3.6 mmol/L (ref 3.5–5.1)
Sodium: 138 mmol/L (ref 135–145)

## 2016-03-13 LAB — GLUCOSE, CAPILLARY
GLUCOSE-CAPILLARY: 200 mg/dL — AB (ref 65–99)
Glucose-Capillary: 143 mg/dL — ABNORMAL HIGH (ref 65–99)
Glucose-Capillary: 156 mg/dL — ABNORMAL HIGH (ref 65–99)
Glucose-Capillary: 96 mg/dL (ref 65–99)

## 2016-03-13 LAB — CBC
HCT: 26.9 % — ABNORMAL LOW (ref 39.0–52.0)
Hemoglobin: 8.5 g/dL — ABNORMAL LOW (ref 13.0–17.0)
MCH: 29.1 pg (ref 26.0–34.0)
MCHC: 31.6 g/dL (ref 30.0–36.0)
MCV: 92.1 fL (ref 78.0–100.0)
PLATELETS: 242 10*3/uL (ref 150–400)
RBC: 2.92 MIL/uL — ABNORMAL LOW (ref 4.22–5.81)
RDW: 15.7 % — AB (ref 11.5–15.5)
WBC: 8.1 10*3/uL (ref 4.0–10.5)

## 2016-03-13 MED ORDER — PANTOPRAZOLE SODIUM 40 MG PO TBEC
40.0000 mg | DELAYED_RELEASE_TABLET | Freq: Two times a day (BID) | ORAL | Status: DC
  Administered 2016-03-13 – 2016-03-15 (×4): 40 mg via ORAL
  Filled 2016-03-13 (×4): qty 1

## 2016-03-13 NOTE — Progress Notes (Signed)
[facility] TEAM 1 - Stepdown/ICU TEAM  Richard Fields  VOZ:[id number] DOB: 05-22-1940 DOA: 02/18/2016 PCP: Asencion Noble, MD    Brief Narrative:  75 y.o.M Hx DM2, severe peripheral neuropathy, HLD, and HTN who presented by ambulance with complaints of blackish/brownish hemoptysis intermittently for 1 month. Very poor historian. He hadassociated right shoulder pain and profusediaphoresis. In the ED he was diagnosed with a cavitary pneumonia.   Significant Events: 11/5 PCCM evaluated  11/6 right chest VATS, remained hypoxic / vented  Subjective: No new complaints today.  Denies chest pain shortness breath fevers chills nausea vomiting or abdominal pain.  Is willing to undergo GI evaluation.  Nurse reports no bowel movements thus far today.  Assessment & Plan:  Cavitary Pneumonia w/ associated Large R pleural effusion - Hydro/Hemothorax 11/6 right chest VATS and flex bronch - care per TCTS - now off abx - clinically stable     Acute Hypoxic respiratory failure Resolved  Melanotic stool subcutaneous heparin discontinued - this issue was originally noted while the patient was still at [facility] but was self-limited at that time - Hgb stabilizing w/ 1U PRBC - will need EGD at least - consult GI in AM   Normocytic anemia - acute blood loss anemia With hemoglobin drifting down and evidence of active blood loss transfused 1 unit - Hgb holding steady for now   Recent Labs Lab 03/11/16 0440 03/11/16 1826 03/12/16 0600 03/12/16 1700 03/13/16 0510  HGB 7.4* 7.7* 7.1* 8.4* 8.5*    COPD well compensated at this time  OSA/OHS pt denies having this hx and has been refusing CPAP/BIPAP  Atrial fibrillation likely secondary to acute stress of severe infetion - currently in NSR/sinus tachy - not a candidate for anticoagulation due to GI bleeding  Hypotension Resolved  Hypokalemia replace to goal of 4.0   Diabetes type 2 uncontrolled with, complications   42/59 D6L 8.7 -  CBG controlled   Morbid Obesity - Body mass index is 41.72 kg/m.   MRSA screen +   DVT prophylaxis: SCDs Code Status: FULL CODE Family Communication: no family present at time of exam  Disposition Plan: SDU  Consultants:  PCCM  TCTS  Antimicrobials:  Cefepime 11/5>11/8 Primaxin 10/19>10/31 Zosyn 11/5 vanc 10/22 >10/31: 11/5>11/8 Flagyl 11/5>11/8 Levaquin 10/31>11/5 + 11/8 > 11/11 Fluconazole 11/5>11/5   Objective: Blood pressure (!) 158/86, pulse (!) 103, temperature 98.7 F (37.1 C), temperature source Oral, resp. rate (!) 25, height '5\' 11"'$  (1.803 m), weight 135.7 kg (299 lb 2.6 oz), SpO2 97 %.  Intake/Output Summary (Last 24 hours) at 03/13/16 1032 Last data filed at 03/13/16 0800  Gross per 24 hour  Intake           895.83 ml  Output             2425 ml  Net         -1529.17 ml   Filed Weights   03/03/16 0600 03/04/16 0314 03/06/16 0500  Weight: (!) 140.5 kg (309 lb 11.9 oz) (!) 137.9 kg (304 lb 0.2 oz) 135.7 kg (299 lb 2.6 oz)    Examination: General: No acute respiratory distress - alert and pleasant  Lungs: Clear to auscultation bilaterally without wheezes or crackles  Cardiovascular: RRR - distant heart sounds Abdomen: Nontender, obese, soft, bowel sounds positive, no rebound Extremities: 1+ edema bilateral lower extremities  CBC:  Recent Labs Lab 03/07/16 0539  03/11/16 0440 03/11/16 1826 03/12/16 0600 03/12/16 1700 03/13/16 0510  WBC 10.2  < > 9.1  7.8 7.2 7.4 8.1  NEUTROABS 8.1*  --  7.1  --   --   --   --   HGB 8.2*  < > 7.4* 7.7* 7.1* 8.4* 8.5*  HCT 26.7*  < > 23.1* 24.3* 22.2* 26.6* 26.9*  MCV 94.0  < > 93.1 92.4 93.3 92.4 92.1  PLT 322  < > 233 249 245 244 242  < > = values in this interval not displayed. Basic Metabolic Panel:  Recent Labs Lab 03/09/16 0415 03/10/16 0537 03/11/16 0440 03/12/16 0600 03/13/16 0510  NA 138 137 139 140 138  K 3.3* 3.6 3.0* 3.0* 3.6  CL 107 107 106 105 103  CO2 '24 26 29 27 27  '$ GLUCOSE 124* 127*  148* 109* 154*  BUN '8 6 6 '$ <5* <5*  CREATININE 0.61 0.57* 0.55* 0.52* 0.51*  CALCIUM 7.4* 7.4* 7.2* 7.3* 7.4*  MG  --   --  1.8  --   --    GFR: Estimated Creatinine Clearance: 108.8 mL/min (by C-G formula based on SCr of 0.51 mg/dL (L)).  Liver Function Tests:  Recent Labs Lab 03/07/16 0539 03/08/16 0441 03/09/16 0415 03/10/16 0537 03/12/16 0600  AST '18 16 17 16 16  '$ ALT 18 16* 16* 17 15*  ALKPHOS 69 70 65 74 79  BILITOT 0.8 0.9 1.1 0.6 0.5  PROT 5.0* 4.6* 4.4* 4.5* 4.1*  ALBUMIN 1.8* 1.8* 1.6* 1.5* 1.5*    Coagulation Profile:  Recent Labs Lab 03/06/16 1345 03/12/16 1250  INR 1.17 1.35    HbA1C: Hgb A1c MFr Bld  Date/Time Value Ref Range Status  02/18/2016 05:17 AM 8.7 (H) 4.8 - 5.6 % Final    Comment:    (NOTE)         Pre-diabetes: 5.7 - 6.4         Diabetes: >6.4         Glycemic control for adults with diabetes: <7.0     CBG:  Recent Labs Lab 03/12/16 0839 03/12/16 1329 03/12/16 1621 03/12/16 2118 03/13/16 0719  GLUCAP 115* 148* 168* 171* 143*    Recent Results (from the past 240 hour(s))  Surgical PCR screen     Status: Abnormal   Collection Time: 03/06/16 10:17 PM  Result Value Ref Range Status   MRSA, PCR POSITIVE (A) NEGATIVE Final    Comment: RESULT CALLED TO, READ BACK BY AND VERIFIED WITH: STOWE,S RN 0310 03/07/16 MITCHELL,L    Staphylococcus aureus POSITIVE (A) NEGATIVE Final    Comment:        The Xpert SA Assay (FDA approved for NASAL specimens in patients over 38 years of age), is one component of a comprehensive surveillance program.  Test performance has been validated by [facility] for patients greater than or equal to 22 year old. It is not intended to diagnose infection nor to guide or monitor treatment.   Culture, body fluid-bottle     Status: None   Collection Time: 03/07/16  9:55 AM  Result Value Ref Range Status   Specimen Description FLUID RIGHT PLEURAL  Final   Special Requests PATIENT ON FOLLOWING   VANCOMYCIN  Final   Culture NO GROWTH 5 DAYS  Final   Report Status 03/12/2016 FINAL  Final  Gram stain     Status: None   Collection Time: 03/07/16  9:55 AM  Result Value Ref Range Status   Specimen Description FLUID RIGHT PLEURAL  Final   Special Requests PATIENT ON FOLLOWING  VANCOMYCIN  Final  Gram Stain   Final    ABUNDANT WBC PRESENT,BOTH PMN AND MONONUCLEAR NO ORGANISMS SEEN    Report Status 03/07/2016 FINAL  Final  Aerobic/Anaerobic Culture (surgical/deep wound)     Status: None   Collection Time: 03/07/16 10:04 AM  Result Value Ref Range Status   Specimen Description TISSUE RIGHT PLEURAL  Final   Special Requests   Final    RIGHT PLEURAL PEEL SPECIMEN C PATIENT ON FOLLOWING  VANCOMYCIN   Gram Stain   Final    MODERATE WBC PRESENT,BOTH PMN AND MONONUCLEAR NO ORGANISMS SEEN    Culture NO GROWTH 5 DAYS  Final   Report Status 03/12/2016 FINAL  Final     Scheduled Meds: . atorvastatin  10 mg Oral q1800  . bisacodyl  10 mg Oral Daily  . chlorhexidine gluconate (MEDLINE KIT)  15 mL Mouth Rinse BID  . insulin aspart  0-5 Units Subcutaneous QHS  . insulin aspart  0-9 Units Subcutaneous TID WC  . insulin glargine  20 Units Subcutaneous Daily  . ipratropium-albuterol  3 mL Nebulization TID  . mouth rinse  15 mL Mouth Rinse QID  . pregabalin  150 mg Oral BID  . senna-docusate  1 tablet Oral QHS   Continuous Infusions: . sodium chloride 10 mL/hr at 03/12/16 1247     LOS: 24 days   Cherene Altes, MD [facility] Office  [phone] Pager - Text Page per Shea Evans as per below:  On-Call/Text Page:      [email or web address]      password [email or web address]  If 7PM-7AM, please contact night-coverage [email or web address] Password [facility] 03/13/2016, 10:32 AM

## 2016-03-13 NOTE — Progress Notes (Signed)
6 Days Post-Op Procedure(s) (LRB): FLEXIBLE VIDEO BRONCHOSCOPY (N/A) VIDEO ASSISTED THORACOSCOPY (VATS) (Right) Subjective: Resting comfortably  Objective: Vital signs in last 24 hours: Temp:  [97.7 F (36.5 C)-99.6 F (37.6 C)] 98.7 F (37.1 C) (11/12 0700) Pulse Rate:  [92-115] 103 (11/12 0830) Cardiac Rhythm: Sinus tachycardia (11/12 0830) Resp:  [22-37] 25 (11/12 0830) BP: (112-158)/(54-86) 158/86 (11/12 0700) SpO2:  [87 %-99 %] 97 % (11/12 0830)  Hemodynamic parameters for last 24 hours:    Intake/Output from previous day: 11/11 0701 - 11/12 0700 In: 1010.8 [P.O.:120; I.V.:422.8; Blood:318; IV Piggyback:150] Out: 2425 [Urine:2425] Intake/Output this shift: Total I/O In: 200 [P.O.:60; I.V.:140] Out: -   General appearance: cooperative and no distress Neurologic: no focal motor deficit Lungs: diminished breath sounds bibasilar right > left  Lab Results:  Recent Labs  03/12/16 1700 03/13/16 0510  WBC 7.4 8.1  HGB 8.4* 8.5*  HCT 26.6* 26.9*  PLT 244 242   BMET:  Recent Labs  03/12/16 0600 03/13/16 0510  NA 140 138  K 3.0* 3.6  CL 105 103  CO2 27 27  GLUCOSE 109* 154*  BUN <5* <5*  CREATININE 0.52* 0.51*  CALCIUM 7.3* 7.4*    PT/INR:  Recent Labs  03/12/16 1250  LABPROT 16.8*  INR 1.35   ABG    Component Value Date/Time   PHART 7.426 03/08/2016 1233   HCO3 27.1 03/08/2016 1233   TCO2 28 03/08/2016 1233   O2SAT 97.0 03/08/2016 1233   CBG (last 3)   Recent Labs  03/12/16 1621 03/12/16 2118 03/13/16 0719  GLUCAP 168* 171* 143*    Assessment/Plan: S/P Procedure(s) (LRB): FLEXIBLE VIDEO BRONCHOSCOPY (N/A) VIDEO ASSISTED THORACOSCOPY (VATS) (Right) -  S/p VATS, drainage of para-pneumonic effusion Tubes out CXR stable   LOS: 24 days    Melrose Nakayama 03/13/2016

## 2016-03-14 ENCOUNTER — Encounter (HOSPITAL_COMMUNITY)
Admission: EM | Disposition: A | Discharge status: Skilled Nursing Facility | Payer: Self-pay | Source: Home / Self Care | Attending: Internal Medicine

## 2016-03-14 ENCOUNTER — Encounter (HOSPITAL_COMMUNITY): Payer: Self-pay | Admitting: *Deleted

## 2016-03-14 ENCOUNTER — Inpatient Hospital Stay (HOSPITAL_COMMUNITY): Payer: Medicare Other | Admitting: Certified Registered Nurse Anesthetist

## 2016-03-14 HISTORY — PX: ESOPHAGOGASTRODUODENOSCOPY (EGD) WITH PROPOFOL: SHX5813

## 2016-03-14 LAB — GLUCOSE, CAPILLARY
GLUCOSE-CAPILLARY: 139 mg/dL — AB (ref 65–99)
GLUCOSE-CAPILLARY: 129 mg/dL — AB (ref 65–99)
GLUCOSE-CAPILLARY: 155 mg/dL — AB (ref 65–99)
GLUCOSE-CAPILLARY: 186 mg/dL — AB (ref 65–99)

## 2016-03-14 SURGERY — ESOPHAGOGASTRODUODENOSCOPY (EGD) WITH PROPOFOL
Anesthesia: Monitor Anesthesia Care

## 2016-03-14 MED ORDER — SUCRALFATE 1 GM/10ML PO SUSP
1.0000 g | Freq: Three times a day (TID) | ORAL | Status: DC
  Administered 2016-03-14 – 2016-03-15 (×4): 1 g via ORAL
  Filled 2016-03-14 (×5): qty 10

## 2016-03-14 MED ORDER — BETHANECHOL CHLORIDE 10 MG PO TABS
10.0000 mg | ORAL_TABLET | Freq: Four times a day (QID) | ORAL | Status: DC
  Administered 2016-03-14 – 2016-03-15 (×5): 10 mg via ORAL
  Filled 2016-03-14 (×7): qty 1

## 2016-03-14 MED ORDER — PROPOFOL 10 MG/ML IV BOLUS
INTRAVENOUS | Status: DC | PRN
  Administered 2016-03-14 (×2): 20 mg via INTRAVENOUS
  Administered 2016-03-14: 50 mg via INTRAVENOUS
  Administered 2016-03-14: 20 mg via INTRAVENOUS

## 2016-03-14 MED ORDER — IPRATROPIUM-ALBUTEROL 0.5-2.5 (3) MG/3ML IN SOLN
3.0000 mL | Freq: Two times a day (BID) | RESPIRATORY_TRACT | Status: DC
  Administered 2016-03-14: 3 mL via RESPIRATORY_TRACT
  Filled 2016-03-14 (×3): qty 3

## 2016-03-14 MED ORDER — SODIUM CHLORIDE 0.9 % IV SOLN: INTRAVENOUS | Status: DC

## 2016-03-14 MED ORDER — ENSURE ENLIVE PO LIQD
237.0000 mL | Freq: Two times a day (BID) | ORAL | Status: DC
  Administered 2016-03-15 (×2): 237 mL via ORAL

## 2016-03-14 MED ORDER — LACTATED RINGERS IV SOLN
INTRAVENOUS | Status: DC | PRN
  Administered 2016-03-14: 13:00:00 via INTRAVENOUS

## 2016-03-14 NOTE — Progress Notes (Signed)
Nutrition Follow-up  DOCUMENTATION CODES:   Morbid obesity  INTERVENTION:    Ensure Enlive PO BID, each supplement provides 350 kcal and 20 grams of protein  NUTRITION DIAGNOSIS:   Increased nutrient needs related to acute illness as evidenced by estimated needs.  Ongoing  GOAL:   Patient will meet greater than or equal to 90% of their needs  Progressing  MONITOR:   PO intake, Supplement acceptance, Skin  REASON FOR ASSESSMENT:   Consult Enteral/tube feeding initiation and management  ASSESSMENT:   75 yo Male remote smoker with DM with severe peripheral neuropathy, hypertension and hyperlipidemia who was admitted to [facility] 02/18/2016 with RLL cavitary pneumonia. Patient is unreliable historian but family and friend deny known baseline lung disease. Thoracentesis 10/30- bloody fluid, culture neg. CT 110/30- large R effusion significantly compressing R lung.  Patient was extubated on 11/7. Diet has been advanced to dysphagia 3 with thin liquids. SLP following for dysphagia and diet tolerance. Patient has been consuming 0-100% of meals. S/P upper endoscopy today, showed a moderately large duodenal ulcer. Labs and medications reviewed.  Diet Order:  DIET DYS 3 Room service appropriate? Yes; Fluid consistency: Thin  Skin:  Wound (see comment) (L ankle wound; chest incision)  Last BM:  11/11  Height:   Ht Readings from Last 1 Encounters:  02/23/16 5\' 11"  (1.803 m)    Weight:   Wt Readings from Last 1 Encounters:  03/06/16 299 lb 2.6 oz (135.7 kg)    Ideal Body Weight:  78 kg  BMI:  Body mass index is 41.72 kg/m.  Estimated Nutritional Needs:   Kcal:  2200-2400  Protein:  140-160 gm  Fluid:  2.2-2.4 L  EDUCATION NEEDS:   No education needs identified at this time  Molli Barrows, [place], [place], [place] Pager [phone] After Hours Pager [phone]

## 2016-03-14 NOTE — Consult Note (Signed)
Referring Provider:  Dr. Joette Catching Primary Care Physician:  Asencion Noble, MD Primary Gastroenterologist:  Dr. Milton Ferguson [place], [place])  Reason for Consultation:  Melena  HPI: Richard Fields is a 75 y.o. male with recurrent dark stools and mild drop in hemoglobin while on subcutaneous heparin for the VTE prophylaxis.  The patient is been in the hospital about a month, having been transferred from [facility] in [place], [place], for treatment of cavitary pneumonia, ultimately requiring VATS surgery. He has made a good recovery. His drains are out and he is comfortable breathing, despite morbid obesity and a possible history of sleep apnea, on room air.  The patient denies dyspeptic symptomatology such as reflux, or nausea. He has no prior history of ulcer disease and denies antecedent use of ulcerogenic medications.   Past Medical History:  Diagnosis Date  . Cervical disc disease   . Chronic constipation   . Diabetes mellitus without complication ([place])    Borderline diabetic-diet controlled  . Hyperlipidemia   . Hypertension   . Peripheral neuropathy [facility])     Past Surgical History:  Procedure Laterality Date  . COLONOSCOPY  2003   RMR: internal hemorrhoids, otherwise normal  . COLONOSCOPY  05/30/2012   Procedure: COLONOSCOPY;  Surgeon: Daneil Dolin, MD;  [facility];  Service: Endoscopy;  Laterality: N/A;  12:00  . FLEXIBLE BRONCHOSCOPY N/A 03/07/2016   Procedure: FLEXIBLE VIDEO BRONCHOSCOPY;  Surgeon: Gaye Pollack, MD;  Location: [place];  Service: Thoracic;  Laterality: N/A;  . HERNIA REPAIR    . TONSILLECTOMY    . VIDEO ASSISTED THORACOSCOPY (VATS)/THOROCOTOMY Right 03/07/2016   Procedure: VIDEO ASSISTED THORACOSCOPY (VATS);  Surgeon: Gaye Pollack, MD;  Location: [facility] OR;  Service: Thoracic;  Laterality: Right;    Prior to Admission medications   Medication Sig Start Date End Date Taking? Authorizing Provider  AMITIZA 24 MCG capsule  Take 2 capsules by mouth daily. 08/14/13  Yes Historical Provider, MD  Aspirin-Salicylamide-Caffeine (BC HEADACHE POWDER PO) Take 1 Package by mouth daily as needed (pain).   Yes Historical Provider, MD  atorvastatin (LIPITOR) 10 MG tablet Take 10 mg by mouth daily.  05/10/12  Yes Historical Provider, MD  benzonatate (TESSALON) 200 MG capsule Take 200 mg by mouth 3 (three) times daily as needed for cough.   Yes Historical Provider, MD  furosemide (LASIX) 40 MG tablet Take 1 tablet (40 mg total) by mouth 2 (two) times daily. Patient taking differently: Take 40 mg by mouth daily.  10/12/15  Yes Davonna Belling, MD  HYDROcodone-acetaminophen (NORCO) 7.5-325 MG tablet Take 1 tablet by mouth every 6 (six) hours as needed for moderate pain (Must last 30 days.Do not drive or operate machinery while taking this medicine.). 01/14/16  Yes Sanjuana Kava, MD  LORazepam (ATIVAN) 2 MG tablet Take 2 mg by mouth at bedtime.   Yes Historical Provider, MD  losartan (COZAAR) 50 MG tablet Take 50 mg by mouth daily.   Yes Historical Provider, MD  metFORMIN (GLUCOPHAGE) 500 MG tablet Take 1 tablet by mouth 2 (two) times daily. 02/08/16  Yes Historical Provider, MD  mirtazapine (REMERON) 15 MG tablet Take 15 mg by mouth at bedtime.  08/18/14  Yes Historical Provider, MD  potassium chloride SA (K-DUR,KLOR-CON) 20 MEQ tablet Take 20 mEq by mouth daily.  08/26/13  Yes Historical Provider, MD  pregabalin (LYRICA) 150 MG capsule Take 150 mg by mouth 2 (two) times daily.   Yes Historical Provider, MD  traZODone (  DESYREL) 50 MG tablet Take 2 tablets by mouth at bedtime.  07/28/14  Yes Historical Provider, MD    Current Facility-Administered Medications  Medication Dose Route Frequency Provider Last Rate Last Dose  . 0.9 %  sodium chloride infusion   Intravenous Continuous Lonia Blood, MD 10 mL/hr at 03/12/16 1247    . 0.9 %  sodium chloride infusion   Intravenous Continuous Bernette Redbird, MD      . Mitzi Hansen Hold] atorvastatin  (LIPITOR) tablet 10 mg  10 mg Oral q1800 Lonia Blood, MD   10 mg at 03/13/16 1633  . [MAR Hold] bethanechol (URECHOLINE) tablet 10 mg  10 mg Oral QID Lonia Blood, MD   10 mg at 03/14/16 1209  . [MAR Hold] bisacodyl (DULCOLAX) EC tablet 10 mg  10 mg Oral Daily Alleen Borne, MD   10 mg at 03/14/16 1005  . [MAR Hold] chlorhexidine gluconate (MEDLINE KIT) (PERIDEX) 0.12 % solution 15 mL  15 mL Mouth Rinse BID Simonne Martinet, NP   15 mL at 03/14/16 0803  . [MAR Hold] fentaNYL (SUBLIMAZE) injection 25-50 mcg  25-50 mcg Intravenous Q2H PRN Alleen Borne, MD   50 mcg at 03/13/16 2343  . [MAR Hold] insulin aspart (novoLOG) injection 0-5 Units  0-5 Units Subcutaneous QHS Lonia Blood, MD      . Mitzi Hansen Hold] insulin aspart (novoLOG) injection 0-9 Units  0-9 Units Subcutaneous TID WC Lonia Blood, MD   1 Units at 03/14/16 1209  . [MAR Hold] insulin glargine (LANTUS) injection 20 Units  20 Units Subcutaneous Daily Pete Glatter, MD   20 Units at 03/14/16 1006  . [MAR Hold] ipratropium-albuterol (DUONEB) 0.5-2.5 (3) MG/3ML nebulizer solution 3 mL  3 mL Nebulization BID Lonia Blood, MD   3 mL at 03/14/16 0938  . [facility] Hold] MEDLINE mouth rinse  15 mL Mouth Rinse QID Simonne Martinet, NP   15 mL at 03/13/16 1600  . [MAR Hold] metoprolol (LOPRESSOR) injection 2.5 mg  2.5 mg Intravenous Q6H PRN Drema Dallas, MD   2.5 mg at 03/13/16 0152  . [MAR Hold] ondansetron (ZOFRAN) injection 4 mg  4 mg Intravenous Q6H PRN Alleen Borne, MD      . Mitzi Hansen Hold] oxyCODONE (Oxy IR/ROXICODONE) immediate release tablet 5-10 mg  5-10 mg Oral Q4H PRN Lonia Blood, MD   10 mg at 03/14/16 0801  . [MAR Hold] pantoprazole (PROTONIX) EC tablet 40 mg  40 mg Oral BID AC Lonia Blood, MD   40 mg at 03/14/16 1006  . [MAR Hold] pregabalin (LYRICA) capsule 150 mg  150 mg Oral BID Henderson Cloud, MD   150 mg at 03/14/16 1006  . [MAR Hold] senna-docusate (Senokot-S) tablet 1 tablet  1 tablet Oral  QHS Alleen Borne, MD   1 tablet at 03/13/16 2343  . [MAR Hold] sodium chloride flush (NS) 0.9 % injection 10-40 mL  10-40 mL Intracatheter PRN Carylon Perches, MD      . Mitzi Hansen Hold] traMADol Janean Sark) tablet 50-100 mg  50-100 mg Oral Q6H PRN Alleen Borne, MD   100 mg at 03/13/16 1255    Allergies as of 02/18/2016 - Review Complete 02/18/2016  Allergen Reaction Noted  . Penicillins Other (See Comments) 05/21/2012  . Tegretol [carbamazepine] Itching 05/21/2012    Family History  Problem Relation Age of Onset  . Cancer Father   . Colon cancer Paternal Uncle   .  Diabetes Brother     Social History   Social History  . Marital status: Married    Spouse name: N/A  . Number of children: N/A  . Years of education: N/A   Occupational History  . [employer]     retired   Social History Main Topics  . Smoking status: Current Every Day Smoker    Years: 40.00  . Smokeless tobacco: Former Systems developer    Types: Chew  . Alcohol use No  . Drug use: No  . Sexual activity: Not on file   Other Topics Concern  . Not on file   Social History Narrative  . No narrative on file    Review of Systems: See history of present illness  Physical Exam: Vital signs in last 24 hours: Temp:  [97.8 F (36.6 C)-99.5 F (37.5 C)] 97.8 F (36.6 C) (11/13 1241) Pulse Rate:  [100-118] 118 (11/13 1241) Resp:  [20-28] 22 (11/13 1241) BP: (126-160)/(63-85) 155/84 (11/13 1241) SpO2:  [90 %-97 %] 92 % (11/13 1241) Last BM Date: 03/12/16 Morbidly obese Caucasian male in no acute distress. He is anicteric and without overt pallor. It is difficult to hear breath sounds but I do not hear any wheezing or rales. Heart unremarkable. Abdomen obese but nontender. Lower extremities are in leg squeezers. No overt focal neurologic deficits or cognitive dysfunction.  Intake/Output from previous day: 11/12 0701 - 11/13 0700 In: 300 [P.O.:60; I.V.:240] Out: 2750 [Urine:2750] Intake/Output this shift: Total  I/O In: 60 [P.O.:60] Out: 700 [Urine:700]  Lab Results:  Recent Labs  03/12/16 0600 03/12/16 1700 03/13/16 0510  WBC 7.2 7.4 8.1  HGB 7.1* 8.4* 8.5*  HCT 22.2* 26.6* 26.9*  PLT 245 244 242   BMET  Recent Labs  03/12/16 0600 03/13/16 0510  NA 140 138  K 3.0* 3.6  CL 105 103  CO2 27 27  GLUCOSE 109* 154*  BUN <5* <5*  CREATININE 0.52* 0.51*  CALCIUM 7.3* 7.4*   LFT  Recent Labs  03/12/16 0600  PROT 4.1*  ALBUMIN 1.5*  AST 16  ALT 15*  ALKPHOS 79  BILITOT 0.5   PT/INR  Recent Labs  03/12/16 1250  LABPROT 16.8*  INR 1.35    Studies/Results: Dg Chest Port 1 View  Result Date: 03/13/2016 CLINICAL DATA:  Pneumonia. EXAM: PORTABLE CHEST 1 VIEW COMPARISON:  March 11, 2016 FINDINGS: Stable cardiomegaly. The left PICC line remains. The right central line has been removed. No pneumothorax. Mild edema. More focal infiltrate and probable associated effusion in the right base is similar in the interval. No other changes. IMPRESSION: 1. Opacity and effusion in the right base is stable. 2. Mild edema, unchanged. 3. No other changes. Electronically Signed   By: Dorise Bullion III M.D   On: 03/13/2016 07:57    Impression: 1. Recurrent melena (had also been noted at other hospital prior to transfer) without overt clinical instability while on subcutaneous heparin 2. Posthemorrhagic anemia, acute on chronic, mild  Plan: Endoscopic evaluation today. Petra Kuba, purpose, risks reviewed and patient agreeable.   LOS: 25 days   Pascal Stiggers V  03/14/2016, 12:58 PM   Pager [phone] If no answer or after 5 PM call [phone]

## 2016-03-14 NOTE — Transfer of Care (Signed)
Immediate Anesthesia Transfer of Care Note  Patient: Richard Fields  Procedure(s) Performed: Procedure(s): ESOPHAGOGASTRODUODENOSCOPY (EGD) WITH PROPOFOL (N/A)  Patient Location: PACU Endo  Anesthesia Type:MAC  Level of Consciousness: awake and patient cooperative  Airway & Oxygen Therapy: Patient Spontanous Breathing and Patient connected to nasal cannula oxygen  Post-op Assessment: Report given to RN and Post -op Vital signs reviewed and stable  Post vital signs: Reviewed and stable  Last Vitals:  Vitals:   03/14/16 1139 03/14/16 1241  BP: 140/84 (!) 155/84  Pulse: 100 (!) 118  Resp: (!) 23 (!) 22  Temp: 36.6 C 36.6 C    Last Pain:  Vitals:   03/14/16 1241  TempSrc: Oral  PainSc:       Patients Stated Pain Goal: 3 ([id number])  Complications: No apparent anesthesia complications

## 2016-03-14 NOTE — Progress Notes (Signed)
Pt transferred from [facility] in stable condition. Pt placed on telemetry, CCMD notified. Will continue to monitor.

## 2016-03-14 NOTE — Progress Notes (Signed)
   03/14/16 1050  Clinical Encounter Type  Visited With Patient  Visit Type Initial;Spiritual support  Referral From Chaplain  Consult/Referral To Chaplain  Spiritual Encounters  Spiritual Needs Emotional  Stress Factors  Patient Stress Factors Family relationships;Major life changes  Family Stress Factors Family relationships;Major life changes  Pt. Is in pain, is depressed  Or fearful more so about health issues and family support, an uncertainty about being here in the hospital, not able to be home around own surroundings, dog, and walking, mobility issues.  Ministry of presence, emotional support, spiritual support, recommend a follow-up with patient, just wants to talk to someone and tell his story, no family members present at the moment.  Dena Billet   [phone]

## 2016-03-14 NOTE — Anesthesia Preprocedure Evaluation (Addendum)
Anesthesia Evaluation  Patient identified by MRN, date of birth, ID band Patient awake    Reviewed: Allergy & Precautions, H&P , NPO status , Patient's Chart, lab work & pertinent test results  History of Anesthesia Complications Negative for: history of anesthetic complications  Airway Mallampati: II  TM Distance: >3 FB Neck ROM: Full    Dental no notable dental hx. (+) Partial Upper, Partial Lower, Dental Advisory Given   Pulmonary pneumonia, Current Smoker,     + decreased breath sounds      Cardiovascular hypertension, Pt. on medications  Rhythm:Regular Rate:Normal     Neuro/Psych negative neurological ROS  negative psych ROS   GI/Hepatic negative GI ROS, Neg liver ROS,   Endo/Other  diabetes, Type 2, Oral Hypoglycemic AgentsMorbid obesity  Renal/GU negative Renal ROS  negative genitourinary   Musculoskeletal   Abdominal   Peds  Hematology negative hematology ROS (+) anemia ,   Anesthesia Other Findings   Reproductive/Obstetrics negative OB ROS                            Anesthesia Physical  Anesthesia Plan  ASA: III  Anesthesia Plan: MAC   Post-op Pain Management:    Induction: Intravenous  Airway Management Planned: Natural Airway and Simple Face Mask  Additional Equipment:   Intra-op Plan:   Post-operative Plan:   Informed Consent: I have reviewed the patients History and Physical, chart, labs and discussed the procedure including the risks, benefits and alternatives for the proposed anesthesia with the patient or authorized representative who has indicated his/her understanding and acceptance.   Dental advisory given  Plan Discussed with: CRNA and Anesthesiologist  Anesthesia Plan Comments:         Anesthesia Quick Evaluation

## 2016-03-14 NOTE — Progress Notes (Signed)
Upper endoscopy was well tolerated and shows a moderately large duodenal ulcer. Please see procedure report for more details and recommendations.  Case discussed with Dr. Thereasa Solo.  The patient is already on twice daily PPI therapy, so I have added sucralfate to his regimen.   I have resumed his dysphagia 3 diet.   Cleotis Nipper, M.D. Pager [phone] If no answer or after 5 PM call [phone]

## 2016-03-14 NOTE — Anesthesia Procedure Notes (Signed)
Procedure Name: MAC Date/Time: 03/14/2016 1:25 PM Performed by: Willeen Cass P Pre-anesthesia Checklist: Patient identified, Emergency Drugs available, Suction available, Patient being monitored and Timeout performed Patient Re-evaluated:Patient Re-evaluated prior to inductionOxygen Delivery Method: Nasal cannula Intubation Type: IV induction Placement Confirmation: positive ETCO2 Dental Injury: Teeth and Oropharynx as per pre-operative assessment

## 2016-03-14 NOTE — Op Note (Signed)
[facility] Patient Name: Richard Fields Procedure Date : 03/14/2016 MRN: [id number] Attending MD: Ronald Lobo , MD Date of Birth: Nov 28, 1940 CSN: [id number] Age: 75 Admit Type: Inpatient Procedure:                Upper GI endoscopy Indications:              Melena with mild drop in hgb in a patient who has                            been in the hospital several weeks for cavitary                            pneumonia, status post VATS,and has been receiving                            subcutaneous heparin for DVT prophylaxis. Providers:                Ronald Lobo, MD, Kingsley Plan, RN, Cherylynn Ridges, Technician, Cira Servant, CRNA Referring MD:              Medicines:                Propofol per Anesthesia Complications:            No immediate complications. Estimated Blood Loss:     Estimated blood loss was minimal. Procedure:                Pre-Anesthesia Assessment:                           - Prior to the procedure, a History and Physical                            was performed, and patient medications and                            allergies were reviewed. The patient's tolerance of                            previous anesthesia was also reviewed. The risks                            and benefits of the procedure and the sedation                            options and risks were discussed with the patient.                            All questions were answered, and informed consent                            was obtained. Prior Anticoagulants: The patient has  had SQ heparin, last dose was 1 day prior to                            procedure. ASA Grade Assessment: III - A patient                            with severe systemic disease. After reviewing the                            risks and benefits, the patient was deemed in                            satisfactory condition to undergo the procedure.                           After obtaining informed consent, the endoscope was                            passed under direct vision. Throughout the                            procedure, the patient's blood pressure, pulse, and                            oxygen saturations were monitored continuously. The                            [id number]) scope was introduced through the                            mouth, and advanced to the second part of duodenum.                            The upper GI endoscopy was accomplished without                            difficulty. The patient tolerated the procedure                            well. Scope In: Scope Out: Findings:      The examined esophagus was normal. No varices esophagitis, or       Mallory-Weiss tear noted.      The entire examined stomach was normal apart from some focal erythema.       Biopsies were taken with a cold forceps for histology to check for       Helicobacter pylori infection.      The cardia and gastric fundus were normal on retroflexion.      One non-bleeding cratered duodenal ulcer with no stigmata of bleeding       was found in the duodenal bulb. The lesion was 14 mm in largest       dimension. the margin of the ulcer was slightly friable but no stigmata       of hemorrhage was evident, so no intervention on the ulcer was  undertaken. Impression:               - Normal esophagus.                           - Normal stomach. Biopsied.                           - One non-bleeding duodenal ulcer with no stigmata                            of bleeding. Moderate Sedation:      This patient was sedated with monitored anesthesia care, not moderate       sedation. Recommendation:           - Await pathology results. If Helicobacter pylori                            infection is present, I would recommend treating it                            with an antibiotic regimen.                           - Continue  present medications, except would try to                            remain off subcutaneous heparin for several days,                            if possible, to allow initial healing of the ulcer                            to occur.                           - Use sucralfate tablets 1 gram PO QID for 2 weeks                            to help expedite ulcer healing and possibly prevent                            recurrent bleeding. Continue current twice-daily                            PPI regimen for at least the next month to ensure                            ulcer healing.                           - patient advised to avoid use of BCs or other                            aspirin products if at all possible.                           -  Following 62-month course of twice-daily PPI                            therapy to achieve ulcer healing, would favor                            lifelong once-daily PPI prophylaxis in this                            medically ill patient.                           - Follow-up endoscopy to confirm ulcer healing not                            required, but could be considered, at the                            discretion of the patient's primary                            gastroenterologist.                           - Resume previous diet. Procedure Code(s):        --- Professional ---                           [phone], Esophagogastroduodenoscopy, flexible,                            transoral; with biopsy, single or multiple Diagnosis Code(s):        --- Professional ---                           K26.9, Duodenal ulcer, unspecified as acute or                            chronic, without hemorrhage or perforation                           K92.1, Melena (includes Hematochezia) CPT copyright 2016 [employer]. All rights reserved. The codes documented in this report are preliminary and upon coder review may  be revised to meet current  compliance requirements. Ronald Lobo, MD 03/14/2016 2:09:29 PM This report has been signed electronically. Number of Addenda: 0

## 2016-03-14 NOTE — Progress Notes (Signed)
[place] TEAM 1 - Stepdown/ICU TEAM  COWEN PESQUEIRA  OMV:[id number] DOB: Oct 20, 1940 DOA: 02/18/2016 PCP: Asencion Noble, MD    Brief Narrative:  75 y.o.M Hx DM2, severe peripheral neuropathy, HLD, and HTN who presented by ambulance with complaints of blackish/brownish hemoptysis intermittently for 1 month. Very poor historian. He hadassociated right shoulder pain and profusediaphoresis. In the ED he was diagnosed with a cavitary pneumonia.   Significant Events: 11/5 PCCM evaluated  11/6 right chest VATS, remained hypoxic / vented  Subjective: No new complaints.  Denies cp, sob, n/v, or abdom pain. Highly motivated to be d/c home.   Assessment & Plan:  Cavitary Pneumonia w/ associated Large R pleural effusion - Hydro/Hemothorax 11/6 right chest VATS and flex bronch - care per TCTS - now off abx - clinically stable     Acute Hypoxic respiratory failure Resolved - on RA - mobilize - IS  Melanotic stool subcutaneous heparin discontinued - this issue was originally noted while the patient was still at [facility] but was self-limited at that time - Hgb stabilizing w/ 1U PRBC - GI consulted - hopeful for EGD later today   Normocytic anemia - acute blood loss anemia With hemoglobin drifting down and evidence of active blood loss transfused 1 unit - Hgb holding steady post transfusion   Recent Labs Lab 03/11/16 0440 03/11/16 1826 03/12/16 0600 03/12/16 1700 03/13/16 0510  HGB 7.4* 7.7* 7.1* 8.4* 8.5*    COPD well compensated at this time  OSA/OHS pt denies having this hx and has been refusing CPAP/BIPAP  Atrial fibrillation likely secondary to acute stress of severe infetion - currently in NSR/sinus tachy - not a candidate for anticoagulation due to GI bleeding - follow on tele   Hypotension Resolved  Hypokalemia Cont to replace to goal of 4.0   Diabetes type 2 uncontrolled with, complications   62/83 M6Q 8.7 - CBG controlled at present   Morbid Obesity -  Body mass index is 41.72 kg/m.   MRSA screen +   DVT prophylaxis: SCDs Code Status: FULL CODE Family Communication: no family present at time of exam  Disposition Plan: f/u EGD - if stable post procedure, transfer to tele bed - PT/OT - begin planning for d/c disposition   Consultants:  PCCM  TCTS  Antimicrobials:  Cefepime 11/5>11/8 Primaxin 10/19>10/31 Zosyn 11/5 vanc 10/22 >10/31: 11/5>11/8 Flagyl 11/5>11/8 Levaquin 10/31>11/5 + 11/8 > 11/11 Fluconazole 11/5>11/5   Objective: Blood pressure (!) 159/85, pulse (!) 104, temperature 98.7 F (37.1 C), temperature source Oral, resp. rate 20, height 5' 11" (1.803 m), weight 135.7 kg (299 lb 2.6 oz), SpO2 92 %.  Intake/Output Summary (Last 24 hours) at 03/14/16 1125 Last data filed at 03/14/16 0800  Gross per 24 hour  Intake              160 ml  Output             3050 ml  Net            -2890 ml   Filed Weights   03/03/16 0600 03/04/16 0314 03/06/16 0500  Weight: (!) 140.5 kg (309 lb 11.9 oz) (!) 137.9 kg (304 lb 0.2 oz) 135.7 kg (299 lb 2.6 oz)    Examination: General: No acute respiratory distress - alert and pleasant - flat affect  Lungs: distant BS in all fields - no wheezing  Cardiovascular: RRR - distant heart sounds - no appreciable M  Abdomen: Nontender, morbidly obese, soft, bowel sounds positive, no rebound  Extremities: 1+ edema bilateral lower extremities - no erythema   CBC:  Recent Labs Lab 03/11/16 0440 03/11/16 1826 03/12/16 0600 03/12/16 1700 03/13/16 0510  WBC 9.1 7.8 7.2 7.4 8.1  NEUTROABS 7.1  --   --   --   --   HGB 7.4* 7.7* 7.1* 8.4* 8.5*  HCT 23.1* 24.3* 22.2* 26.6* 26.9*  MCV 93.1 92.4 93.3 92.4 92.1  PLT 233 249 245 244 412   Basic Metabolic Panel:  Recent Labs Lab 03/09/16 0415 03/10/16 0537 03/11/16 0440 03/12/16 0600 03/13/16 0510  NA 138 137 139 140 138  K 3.3* 3.6 3.0* 3.0* 3.6  CL 107 107 106 105 103  CO2 _0 GLUCOSE 124* 127* 148* 109* 154*  BUN _1 <5* <5*  CREATININE 0.61 0.57* 0.55* 0.52* 0.51*  CALCIUM 7.4* 7.4* 7.2* 7.3* 7.4*  MG  --   --  1.8  --   --    GFR: Estimated Creatinine Clearance: 108.8 mL/min (by C-G formula based on SCr of 0.51 mg/dL (L)).  Liver Function Tests:  Recent Labs Lab 03/08/16 0441 03/09/16 0415 03/10/16 0537 03/12/16 0600  AST _2 ALT 16* 16* 17 15*  ALKPHOS 70 65 74 79  BILITOT 0.9 1.1 0.6 0.5  PROT 4.6* 4.4* 4.5* 4.1*  ALBUMIN 1.8* 1.6* 1.5* 1.5*    Coagulation Profile:  Recent Labs Lab 03/12/16 1250  INR 1.35    HbA1C: Hgb A1c MFr Bld  Date/Time Value Ref Range Status  02/18/2016 05:17 AM 8.7 (H) 4.8 - 5.6 % Final    Comment:    (NOTE)         Pre-diabetes: 5.7 - 6.4         Diabetes: >6.4         Glycemic control for adults with diabetes: <7.0     CBG:  Recent Labs Lab 03/13/16 0719 03/13/16 1039 03/13/16 1631 03/13/16 2109 03/14/16 0735  GLUCAP 143* 156* 96 200* 139*    Recent Results (from the past 240 hour(s))  Surgical PCR screen     Status: Abnormal   Collection Time: 03/06/16 10:17 PM  Result Value Ref Range Status   MRSA, PCR POSITIVE (A) NEGATIVE Final    Comment: RESULT CALLED TO, READ BACK BY AND VERIFIED WITH: STOWE,S RN 0310 03/07/16 MITCHELL,L    Staphylococcus aureus POSITIVE (A) NEGATIVE Final    Comment:        The Xpert SA Assay (FDA approved for NASAL specimens in patients over 74 years of age), is one component of a comprehensive surveillance program.  Test performance has been validated by [facility] for patients greater than or equal to 19 year old. It is not intended to diagnose infection nor to guide or monitor treatment.   Culture, body fluid-bottle     Status: None   Collection Time: 03/07/16  9:55 AM  Result Value Ref Range Status   Specimen Description FLUID RIGHT PLEURAL  Final   Special Requests PATIENT ON FOLLOWING  VANCOMYCIN  Final   Culture NO GROWTH 5 DAYS  Final   Report Status 03/12/2016 FINAL   Final  Gram stain     Status: None   Collection Time: 03/07/16  9:55 AM  Result Value Ref Range Status   Specimen Description FLUID RIGHT PLEURAL  Final   Special Requests PATIENT ON FOLLOWING  VANCOMYCIN  Final   Gram Stain   Final    ABUNDANT WBC  PRESENT,BOTH PMN AND MONONUCLEAR NO ORGANISMS SEEN    Report Status 03/07/2016 FINAL  Final  Aerobic/Anaerobic Culture (surgical/deep wound)     Status: None   Collection Time: 03/07/16 10:04 AM  Result Value Ref Range Status   Specimen Description TISSUE RIGHT PLEURAL  Final   Special Requests   Final    RIGHT PLEURAL PEEL SPECIMEN C PATIENT ON FOLLOWING  VANCOMYCIN   Gram Stain   Final    MODERATE WBC PRESENT,BOTH PMN AND MONONUCLEAR NO ORGANISMS SEEN    Culture NO GROWTH 5 DAYS  Final   Report Status 03/12/2016 FINAL  Final     Scheduled Meds: . atorvastatin  10 mg Oral q1800  . bethanechol  10 mg Oral QID  . bisacodyl  10 mg Oral Daily  . chlorhexidine gluconate (MEDLINE KIT)  15 mL Mouth Rinse BID  . insulin aspart  0-5 Units Subcutaneous QHS  . insulin aspart  0-9 Units Subcutaneous TID WC  . insulin glargine  20 Units Subcutaneous Daily  . ipratropium-albuterol  3 mL Nebulization BID  . mouth rinse  15 mL Mouth Rinse QID  . pantoprazole  40 mg Oral BID AC  . pregabalin  150 mg Oral BID  . senna-docusate  1 tablet Oral QHS   Continuous Infusions: . sodium chloride 10 mL/hr at 03/12/16 1247     LOS: 25 days   Cherene Altes, MD [facility] Office  [phone] Pager - Text Page per Shea Evans as per below:  On-Call/Text Page:      [email or web address]      password [email or web address]  If 7PM-7AM, please contact night-coverage [email or web address] Password [email or web address] 03/14/2016, 11:25 AM

## 2016-03-14 NOTE — Progress Notes (Signed)
SLP Cancellation Note  Patient Details Name: Richard Fields MRN: [id number] DOB: 1941/02/15   Cancelled treatment:       Reason Eval/Treat Not Completed: Patient at procedure or test/unavailable   Germain Osgood 03/14/2016, 2:19 PM  Germain Osgood, M.A. CCC-SLP [phone]

## 2016-03-14 NOTE — Clinical Social Work Note (Signed)
CSW continues to follow for discharge needs.  Daisie Haft, CSW [phone]  

## 2016-03-14 NOTE — Progress Notes (Signed)
Patient appears to be in a depressed mood about health and emotional. He states he needs "Hope for the future" I talked with him offering words of encouragement. I also placed spiritual consult, patient agreed he would like that. Patient denies any plans for harm to self he wants to feel better with his health. I will continue to monitor

## 2016-03-14 NOTE — Care Management Important Message (Signed)
Important Message  Patient Details  Name: YAVUZ BLACKLEY MRN: [id number] Date of Birth: 08-Mar-1941   Medicare Important Message Given:  Yes    Zenon Mayo, RN 03/14/2016, 11:17 AMImportant Message  Patient Details  Name: ADRIELL PRONOVOST MRN: [id number] Date of Birth: February 16, 1941   Medicare Important Message Given:  Yes    Zenon Mayo, RN 03/14/2016, 11:17 AM

## 2016-03-14 NOTE — Anesthesia Postprocedure Evaluation (Signed)
Anesthesia Post Note  Patient: Richard Fields  Procedure(s) Performed: Procedure(s) (LRB): ESOPHAGOGASTRODUODENOSCOPY (EGD) WITH PROPOFOL (N/A)  Patient location during evaluation: PACU Anesthesia Type: MAC Level of consciousness: awake and alert Pain management: pain level controlled Vital Signs Assessment: post-procedure vital signs reviewed and stable Respiratory status: spontaneous breathing and respiratory function stable Cardiovascular status: stable Anesthetic complications: no    Last Vitals:  Vitals:   03/14/16 1341 03/14/16 1350  BP: (!) 150/83 109/74  Pulse: (!) 115 (!) 107  Resp: (!) 23 (!) 31  Temp:      Last Pain:  Vitals:   03/14/16 1241  TempSrc: Oral  PainSc:                  Corynn Solberg,Salih DANIEL

## 2016-03-15 ENCOUNTER — Encounter (HOSPITAL_COMMUNITY): Payer: Self-pay | Admitting: Gastroenterology

## 2016-03-15 LAB — GLUCOSE, CAPILLARY
GLUCOSE-CAPILLARY: 159 mg/dL — AB (ref 65–99)
Glucose-Capillary: 198 mg/dL — ABNORMAL HIGH (ref 65–99)

## 2016-03-15 LAB — CBC
HCT: 29.6 % — ABNORMAL LOW (ref 39.0–52.0)
HEMOGLOBIN: 9.3 g/dL — AB (ref 13.0–17.0)
MCH: 29.1 pg (ref 26.0–34.0)
MCHC: 31.4 g/dL (ref 30.0–36.0)
MCV: 92.5 fL (ref 78.0–100.0)
PLATELETS: 246 10*3/uL (ref 150–400)
RBC: 3.2 MIL/uL — ABNORMAL LOW (ref 4.22–5.81)
RDW: 15.7 % — AB (ref 11.5–15.5)
WBC: 7.3 10*3/uL (ref 4.0–10.5)

## 2016-03-15 LAB — BASIC METABOLIC PANEL
Anion gap: 7 (ref 5–15)
CALCIUM: 7.6 mg/dL — AB (ref 8.9–10.3)
CHLORIDE: 104 mmol/L (ref 101–111)
CO2: 27 mmol/L (ref 22–32)
CREATININE: 0.52 mg/dL — AB (ref 0.61–1.24)
GFR calc Af Amer: >60 mL/min (ref >60)
GFR calc non Af Amer: >60 mL/min (ref >60)
Glucose, Bld: 149 mg/dL — ABNORMAL HIGH (ref 65–99)
Potassium: 3.1 mmol/L — ABNORMAL LOW (ref 3.5–5.1)
SODIUM: 138 mmol/L (ref 135–145)

## 2016-03-15 LAB — MAGNESIUM: Magnesium: 1.5 mg/dL — ABNORMAL LOW (ref 1.7–2.4)

## 2016-03-15 MED ORDER — INSULIN ASPART 100 UNIT/ML ~~LOC~~ SOLN: 0.0000 [IU] | Freq: Three times a day (TID) | SUBCUTANEOUS | Status: DC

## 2016-03-15 MED ORDER — PANTOPRAZOLE SODIUM 40 MG PO TBEC: 40.0000 mg | DELAYED_RELEASE_TABLET | Freq: Two times a day (BID) | ORAL | 1 refills | Status: DC

## 2016-03-15 MED ORDER — SUCRALFATE 1 GM/10ML PO SUSP: 1.0000 g | Freq: Three times a day (TID) | ORAL | 0 refills | Status: DC

## 2016-03-15 MED ORDER — POTASSIUM CHLORIDE CRYS ER 20 MEQ PO TBCR
40.0000 meq | EXTENDED_RELEASE_TABLET | ORAL | Status: AC
  Administered 2016-03-15 (×2): 40 meq via ORAL
  Filled 2016-03-15 (×2): qty 2

## 2016-03-15 MED ORDER — MAGNESIUM SULFATE 2 GM/50ML IV SOLN
2.0000 g | Freq: Once | INTRAVENOUS | Status: AC
  Administered 2016-03-15: 2 g via INTRAVENOUS
  Filled 2016-03-15: qty 50

## 2016-03-15 NOTE — Clinical Social Work Note (Signed)
Pt is ready for discharge today and will go to [facility]. Facility has received discharge information and is ready to accept pt. RN to call report and PTAR to provide transportation. Pt's family is aware of discharge plan. CSW is signing off as no further needs identified.   Darden Dates, MSW, LCSW  Clinical Social Worker  [phone]

## 2016-03-15 NOTE — Clinical Social Work Placement (Signed)
   CLINICAL SOCIAL WORK PLACEMENT  NOTE  Date:  03/15/2016  Patient Details  Name: Richard Fields MRN: [id number] Date of Birth: 02-03-1941  Clinical Social Work is seeking post-discharge placement for this patient at the [place] level of care (*CSW will initial, date and re-position this form in  chart as items are completed):  Yes   Patient/family provided with [facility] Work Department's list of facilities offering this level of care within the geographic area requested by the patient (or if unable, by the patient's family).  Yes   Patient/family informed of their freedom to choose among providers that offer the needed level of care, that participate in Medicare, Medicaid or managed care program needed by the patient, have an available bed and are willing to accept the patient.  Yes   Patient/family informed of [facility]'s ownership interest in [facility] and [facility], as well as of the fact that they are under no obligation to receive care at these facilities.  PASRR submitted to EDS on 02/26/16     PASRR number received on 02/26/16     Existing PASRR number confirmed on       FL2 transmitted to all facilities in geographic area requested by pt/family on 02/26/16     FL2 transmitted to all facilities within larger geographic area on       Patient informed that his/her managed care company has contracts with or will negotiate with certain facilities, including the following:        Yes   Patient/family informed of bed offers received.  Patient chooses bed at [place] [facility]     Physician recommends and patient chooses bed at      Patient to be transferred to [facility] [place] on 03/15/16.  Patient to be transferred to facility by PTAR     Patient family notified on 03/15/16 of transfer.  Name of family member notified:  Pt's wife Joann     PHYSICIAN       Additional Comment:     _______________________________________________ Darden Dates, LCSW 03/15/2016, 8:40 PM

## 2016-03-15 NOTE — Care Management Note (Signed)
Case Management Note  Patient Details  Name: Richard Fields MRN: [id number] Date of Birth: 01/19/1941  Subjective/Objective:           Day 19, received from 3S with in last 24 hours. Cavitary PNA, VATS, [place] DC'd today. Plan is for SNF, may DC today, CSW following.          Action/Plan:  DC to SNF likely within next 24 hours.  Expected Discharge Date:                  Expected Discharge Plan:  Skilled Nursing Facility  In-House Referral:  Clinical Social Work  Discharge planning Services  NA  Post Acute Care Choice:  NA Choice offered to:  NA  DME Arranged:    DME Agency:     HH Arranged:    [facility] Agency:     Status of Service:  Completed, signed off  If discussed at [employer], dates discussed:    Additional Comments:  Carles Collet, RN 03/15/2016, 10:09 AM

## 2016-03-15 NOTE — Progress Notes (Signed)
Speech Language Pathology Treatment: Dysphagia  Patient Details Name: Richard Fields MRN: [id number] DOB: 04/14/1941 Today's Date: 03/15/2016 Time: 3735-7897 SLP Time Calculation (min) (ACUTE ONLY): 9 min  Assessment / Plan / Recommendation Clinical Impression  Pt seen for dysphagia followup. Pt managed all PO WFL, but required extra time for mastication of dry solids. Pt reported being satisfied with his current diet texture and will defer upgrade to regular. Rec: Continue with Dys 3 and thin. No further SLP services warranted. Pt is in agreement with POC.   HPI HPI: 75 yo remote smoker with DM with severe peripheral neuropathy, hypertension and hyperlipidemia who was admitted to [facility] 02/18/2016 with RLL cavitary pneumonia. Patient is unreliable historian but family and friend deny known baseline lung disease. Thoracentesis 10/30- bloody fluid, culture neg. CT 110/30- large R effusion significantly compressing R lung. Pt intubated 11/6 for bronchoscopy and extubated 11/7.      SLP Plan  All goals met;Discharge SLP treatment due to (comment)     Recommendations  Diet recommendations: Dysphagia 3 (mechanical soft);Thin liquid Liquids provided via: Cup;Straw Medication Administration: Whole meds with liquid Supervision: Staff to assist with self feeding Compensations: Minimize environmental distractions;Slow rate;Small sips/bites Postural Changes and/or Swallow Maneuvers: Seated upright 90 degrees;Upright 30-60 min after meal                Oral Care Recommendations: Oral care QID Follow up Recommendations: None Plan: All goals met;Discharge SLP treatment due to (comment)       Herrick MA, [place] Pager [phone] 03/15/2016, 10:33 AM

## 2016-03-15 NOTE — Progress Notes (Signed)
Yesterday's endoscopic findings reviewed.  Hemoglobin improved over the past 24 hours.  Patient currently on twice daily PPI, as well as sucralfate which was added yesterday.  Biopsies from the stomach from yesterday's endoscopy are negative for Helicobacter pylori infection.  Impression:   1. Recent bleeding from duodenal ulcer   2. Posthemorrhagic anemia, improving  Recommendation:  1. Please see yesterday's procedure note for full recommendations  2. When patient is discharged, I would arrange follow-up with his primary gastroenterologist in [place], Dr. Milton Ferguson, who could decide on whether follow-up endoscopy to confirm ulcer healing is warranted.  3. I will sign off at this time. Please call me if I can be of further assistance in this patient's care.  Cleotis Nipper, M.D. Pager [phone] If no answer or after 5 PM call [phone]

## 2016-03-15 NOTE — Evaluation (Signed)
Physical Therapy Evaluation Patient Details Name: Richard Fields MRN: [id number] DOB: 1940-06-09 Today's Date: 03/15/2016   History of Present Illness  patient is a 75 y/o male who has been in the hospital about a month, having been transferred from [facility] in [place], [place], for treatment of cavitary pneumonia, ultimately requiring VATS surgery. Pt is now s/p ESOPHAGOGASTRODUODENOSCOPY on 03/14/16  Clinical Impression  Pt presents with impaired strength, balance, transfers, endurance and functional abilities and will benefit from skilled PT services to address deficits and improve functional mobility.    Follow Up Recommendations SNF    Equipment Recommendations  None recommended by PT    Recommendations for Other Services       Precautions / Restrictions Precautions Precautions: Fall Restrictions Weight Bearing Restrictions: No      Mobility  Bed Mobility Overal bed mobility: +2 for physical assistance Bed Mobility: Supine to Sit;Sit to Supine     Supine to sit: +2 for physical assistance Sit to supine: +2 for physical assistance   General bed mobility comments: needs assist for trunk and bilat LEs and for positioning in bed. Pt able to assist with LEs and pull with UEs but able to perform <25%  Transfers Overall transfer level: Needs assistance Equipment used: Rolling walker (2 wheeled) Transfers: Sit to/from Stand Sit to Stand: +2 physical assistance         General transfer comment: attempted sit <> stand x 3 during session. Pt initially unable to stand x 2 attempts despite +2 assist with cues for UE placement. With bed elevated pt able to stand x 5 seconds with +2 total A.   Ambulation/Gait                Stairs            Wheelchair Mobility    Modified Rankin (Stroke Patients Only)       Balance       Sitting balance - Comments: Pt able to sit edge of bed x 10 minutes with supervision with 1 UE support on  bedrail.  Pt HR increases to 140-155bpm during sitting and standing tasks. spO2 92% on room air throughout treatment                                     Pertinent Vitals/Pain Pain Assessment: No/denies pain    Home Living Family/patient expects to be discharged to:: [place]: [place] Rile - 2 wheels      Prior Function Level of Independence: Independent               Hand Dominance        Extremity/Trunk Assessment   Upper Extremity Assessment: Generalized weakness           Lower Extremity Assessment: Generalized weakness      Cervical / Trunk Assessment: Kyphotic  Communication   Communication: No difficulties  Cognition Arousal/Alertness: Awake/alert Behavior During Therapy: WFL for tasks assessed/performed           Following Commands: Follows one step commands consistently            General Comments      Exercises     Assessment/Plan    PT Assessment Patient needs continued PT services  PT Problem List Decreased strength;Decreased balance;Decreased range of motion;Decreased mobility;Decreased activity  tolerance;Decreased safety awareness;Decreased skin integrity;Obesity;Cardiopulmonary status limiting activity;Pain;Decreased knowledge of precautions;Decreased knowledge of use of DME;Decreased cognition          PT Treatment Interventions DME instruction;Gait training;Therapeutic exercise;Cognitive remediation;Wheelchair mobility training;Therapeutic activities;Neuromuscular re-education;Functional mobility training;Balance training;Patient/family education;Modalities    PT Goals (Current goals can be found in the Care Plan section)  Acute Rehab PT Goals Patient Stated Goal: get stronger PT Goal Formulation: With patient Time For Goal Achievement: 03/29/16 Potential to Achieve Goals: Good    Frequency Min 3X/week   Barriers to discharge   pt to d/c to SNF     Co-evaluation               End of Session   Activity Tolerance: Patient limited by fatigue Patient left: in bed;with call bell/phone within reach;with bed alarm set Nurse Communication: Mobility status;Need for lift equipment;Other (comment) (requested bariatric recliner and recommend nursing use maxi move for transfers)         Time: [id number] PT Time Calculation (min) (ACUTE ONLY): 26 min   Charges:   PT Evaluation $PT Eval High Complexity: 1 Procedure PT Treatments $Therapeutic Activity: 8-22 mins   PT G Codes:        Richard Fields 04/09/16, 9:25 AM

## 2016-03-15 NOTE — Progress Notes (Signed)
OT Cancellation Note  Patient Details Name: Richard Fields MRN: [id number] DOB: Jun 02, 1940   Cancelled Treatment:    Reason Eval/Treat Not Completed: Fatigue/lethargy limiting ability to participate. Pt states that he doesn't;t want anymore therapy today  Britt Bottom 03/15/2016, 12:27 PM

## 2016-03-15 NOTE — Discharge Summary (Signed)
Physician Discharge Summary  Richard Fields [id number] DOB: 1941-04-26 DOA: 02/18/2016  PCP: Asencion Noble, MD  Admit date: 02/18/2016 Discharge date: 03/15/2016  Admitted From: Home Disposition:  SNF  Recommendations for Outpatient Follow-up:  1. Follow up with PCP in 1 week 2. Please obtain CBC, BMP, Mg in 1 week  3. Follow up with Cardiothoracic surgery Dr. Cyndia Bent on 03/30/2016. Take chest xray at 11am and appointment time is 11:30am.  4. Follow up with your GI physician in [place], Dr. Gala Romney, who could decide on whether follow-up endoscopy to confirm ulcer healing is warranted.  Home Health: No  Equipment/Devices: None   Discharge Condition: Stable CODE STATUS: Full  Diet recommendation: Regular  Brief/Interim Summary: Richard Gogerty [place]is a 75 y.o.malewith significant past medical history diabetes type hypertension, hyperlipidemia, peripheral neuropathy, anddementia presented to outside hospital ([facility] 02/18/2016 for pneumonia, found to have a cavitary pneumonia with hemoptysis, started on broad-spectrum antibiotics. Patient was found to be tachypneic on 10/21 and transferred to the ICU for further evaluation. He was intubated on 02/20/2016 and treated for sepsis. His steroids were increased as well. Extubated 02/23/2016. GI was consulted on 02/28/2016 for dark black tarry stools with no signs of hematemesis or melena hematochezia. Hemoccult was positive at the time. GI recommend patient received packed red blood cell transfusion recommended PPI IVtherapy as well as the time. No endoscopy recommended at time. GI recommended EGD prior to discharge. Patient was status post ultrasound-guided right thoracentesis on 02/29/2016, with 100 mL of dark old bloody fluid drained. IVantibiotics and IV steroids were discontinued and transitioned to by mouth Levaquin on 03/01/2016, by mouth prednisone was added. On 11/4, patient developed low blood pressures and  improved with normal saline 250 mL bolus. CT chest was ordered, which shows more fluid,concerning for a loculated effusion/empyema. Thepulmonologist, Dr. Luan Pulling, discussed with thoracic surgeon, Dr. Tildon Husky recommended transfer to our faciliytfor a VATS procedure. Patient was transferred to [facility] on 11/5. Her underwent VATS on 11/6 with chest tube placement.  He also developed recurrent GI bleed and underwent EGD on 11/13 which showed duodenal ulcer and placed on BID PPI as well as sucralfate.   Subjective on day of discharge: He is feeling well. Generalized weakness from deconditioning, but no acute complaints. Denies pain, chest pain, shortness of breath, cough, abdominal pain, nausea, vomiting, diarrhea, dizziness, lightheadedness. Anxious to get out of hospital.   Discharge Diagnoses:  Principal Problem:   Cavitary pneumonia Active Problems:   Hemoptysis   Morbid obesity ([place])   Pneumatosis intestinalis   Pressure injury of skin   Type II diabetes mellitus, uncontrolled (HCC)   Sepsis ([place])   Elevated troponin level   Acute respiratory failure with hypoxia (HCC)   Encephalopathy, metabolic   Acute upper GI bleeding   Acute blood loss anemia   Protein-calorie malnutrition (HCC)   Abdominal pain, right lateral   Aspirin long-term use   Hypoxia   Pleural effusion on right   S/P thoracentesis   SOB (shortness of breath)   Empyema (HCC)   Acute pain of right shoulder   Uncontrolled type 2 diabetes mellitus with complication (HCC)   Atrial fibrillation ([place])  Cavitary pneumonia with right empyema, loculated effusion, hydrothorax, hemothorax -S/p right thoracentesis 10/30. Repeat CT revealed large recurrent pleural effusion. Pulm and CTS consulted and underwent VATS 11/6 (RLL consolidation with abscess adherent to chest wall unroofed) with chest tube placement (removed 11/10). Patient has received multiple antibiotics throughout hospitalization, see below. Now off  antibiotics. No growth  from cultures. Sepsis pathology resolved.    UGIB secondary to duodenal ulcer causing blood loss anemia and melena  -Transfused 2u pRBC on 10/29, 1u pRBC 11/11  -S/p EGD 11/13 showed large duodenal ulcer  -GI following  -PPI BID, sucralfate  -Hgb stable. Recheck Hgb as outpatient    Hypokalemia -Check Mg, replace K today. Recheck levels per PCP   Acute hypoxemic respiratory failure -Extubated and now resolved on room air   Elevated troponin -Thought to be secondary to demand ischemia, no evidence of ACS  DM type 2 -Ha1c 8.7  -Lantus, SSI. Start metformin as outpatient   MAT -Secondary to acute illnesses  HLD -Continue statin   Acute metabolic encephalopathy -Resolved   Deconditioning -PT/OT evaluation recommending SNF   Pneumatosis coli -General surgery consulted (10/19). Unclear etiology, although may be associated with recent coronary disease, coughing, vomiting, without evidence to suggest ischemia at this time. Abdominal exam was unremarkable. Gen. surgery has now signed off.   Discharge Instructions  Discharge Instructions    Diet - low sodium heart healthy    Complete by:  As directed    Increase activity slowly    Complete by:  As directed        Medication List    STOP taking these medications   AMITIZA 24 MCG capsule Generic drug:  lubiprostone   BC HEADACHE POWDER PO   furosemide 40 MG tablet Commonly known as:  LASIX   HYDROcodone-acetaminophen 7.5-325 MG tablet Commonly known as:  NORCO   LORazepam 2 MG tablet Commonly known as:  ATIVAN   losartan 50 MG tablet Commonly known as:  COZAAR   potassium chloride SA 20 MEQ tablet Commonly known as:  K-DUR,KLOR-CON     TAKE these medications   atorvastatin 10 MG tablet Commonly known as:  LIPITOR Take 10 mg by mouth daily.   benzonatate 200 MG capsule Commonly known as:  TESSALON Take 200 mg by mouth 3 (three) times daily as needed for cough.    metFORMIN 500 MG tablet Commonly known as:  GLUCOPHAGE Take 1 tablet by mouth 2 (two) times daily.   mirtazapine 15 MG tablet Commonly known as:  REMERON Take 15 mg by mouth at bedtime.   pantoprazole 40 MG tablet Commonly known as:  PROTONIX Take 1 tablet (40 mg total) by mouth 2 (two) times daily before a meal.   pregabalin 150 MG capsule Commonly known as:  LYRICA Take 150 mg by mouth 2 (two) times daily.   sucralfate 1 GM/10ML suspension Commonly known as:  CARAFATE Take 10 mLs (1 g total) by mouth 4 (four) times daily -  with meals and at bedtime.   traZODone 50 MG tablet Commonly known as:  DESYREL Take 2 tablets by mouth at bedtime.       Contact information for follow-up providers    Gaye Pollack, MD Follow up on 03/30/2016.   Specialty:  Cardiothoracic Surgery Why:  PA/LAT CXR to be taken (at [facility] which is in the same building as Dr. Vivi Martens office) on 03/30/2016 at 11:00 am;Appointment time is at 11:30 am Contact information: [address] [place] [address] [phone]        Asencion Noble, MD. Schedule an appointment as soon as possible for a visit in 1 week(s).   Specialty:  Internal Medicine Contact information: [address] [place] [address] [phone]        Manus Rudd, MD. Schedule an appointment as soon as possible for  a visit in 2 week(s).   Specialty:  Gastroenterology Contact information: [address] [place] [address] [phone]            Contact information for after-discharge care    Destination    [facility] Follow up.   Specialty:  Skilled Nursing Facility Contact information: [place] [phone]                 Allergies  Allergen Reactions  . Penicillins Other (See Comments)    Unknown-reaction is unknown  . Tegretol [Carbamazepine] Itching  . Cefepime Rash    Consultants:    Pulmonology/critical care  General surgery   GI   CTS   Procedures:   BiPAP 10/21, subsequently intubated 10/21, extubated 10/24  Right thoracentesis 10/30   Central line placement 11/6   Intubated for VATS 11/6, extubated 11/7  VATS 11/6 with chest tube placement. Chest tube removed 11/10.   Antimicrobials:   Levaquin 10/19 - 10/19   Vancomycin 10/19 - 10/31   Primaxin 10/19 - 10/31   Levaquin 10/31 - 11/5   Zosyn once 11/5   Cefepime 11/5 - 11/8  Vanco 11/5 - 11/8   Diflucan once 11/6   Flagyl 11/6 - 11/8  Levaquin 11/8 - 11/11  Procedures/Studies: Dg Chest 1 View  Result Date: 03/05/2016 CLINICAL DATA:  Tachycardia. EXAM: CHEST 1 VIEW COMPARISON:  02/29/2016 and CT from 02/29/2016 FINDINGS: Again noted is a large right pleural effusion with compressive atelectasis. Only a small portion of the right lung is aerated. Right pleural fluid has increased since the prior chest radiograph. Left lung remains clear. Cardiac silhouette is grossly stable in size but partially obscured by the right chest densities. Negative for pneumothorax. Patient is slightly rotated towards the right. IMPRESSION: Large right pleural effusion with compressive atelectasis. Only a small portion of the right lung is aerated. The right pleural fluid has slightly enlarged since 02/29/2016. Electronically Signed   By: Markus Daft M.D.   On: 03/05/2016 11:28   Dg Chest 1 View  Result Date: 02/19/2016 CLINICAL DATA:  Shortness of breath for several weeks EXAM: CHEST 1 VIEW COMPARISON:  02/18/2016 FINDINGS: Cardiac shadow is enlarged. Right-sided pleural effusion is increasing from the prior exam. No focal infiltrate is seen in the left lung. No bony abnormality is noted. IMPRESSION: Increasing right-sided pleural effusion. Electronically Signed   By: Inez Catalina M.D.   On: 02/19/2016 09:28   Ct Chest W Contrast  Result Date: 02/29/2016 CLINICAL DATA:  Pneumonia, intubated EXAM: CT CHEST  WITH CONTRAST TECHNIQUE: Multidetector CT imaging of the chest was performed during intravenous contrast administration. CONTRAST:  61mL ISOVUE-300 IOPAMIDOL (ISOVUE-300) INJECTION 61% COMPARISON:  02/17/2016 FINDINGS: Cardiovascular: No significant vascular findings. Normal heart size. No pericardial effusion. Coronary artery atherosclerosis in the LAD and circumflex. Mediastinum/Nodes: No enlarged mediastinal, hilar, or axillary lymph nodes. Thyroid gland, trachea, and esophagus demonstrate no significant findings. Lungs/Pleura: Large right pleural effusion is severe compressive atelectasis with only a small area of lung at the apex. No left pleural effusion. No pneumothorax. Upper Abdomen: No acute upper abdominal abnormality. Musculoskeletal: No acute osseous abnormality. No lytic or sclerotic osseous lesion. IMPRESSION: 1. Large right pleural effusion with severe compressive atelectasis with only a small area of aerated lung at the apex. 2. Coronary artery atherosclerosis. Electronically Signed   By: Kathreen Devoid   On: 02/29/2016 13:12   Ct Angio Chest Pe W Or Wo Contrast  Result  Date: 03/05/2016 CLINICAL DATA:  Elevated D-dimer with shortness of breath. EXAM: CT ANGIOGRAPHY CHEST WITH CONTRAST TECHNIQUE: Multidetector CT imaging of the chest was performed using the standard protocol during bolus administration of intravenous contrast. Multiplanar CT image reconstructions and MIPs were obtained to evaluate the vascular anatomy. CONTRAST:  125 mL Isovue 370 COMPARISON:  Chest CT 02/29/2016 FINDINGS: Cardiovascular: No evidence for a pulmonary embolism. Normal caliber of the thoracic aorta and no evidence for aortic dissection. There is flow in the great vessels. Coronary artery calcifications. Mediastinum/Nodes: There is no significant chest lymphadenopathy. However, there is low-density material along the right anterior mediastinum. This was present on the previous examination but not present on the exam  from 02/18/2016. This may represent loculated fluid.Esophagus is unremarkable. No significant axillary lymphadenopathy. Lungs/Pleura: There is a massive right pleural effusion occupying most of the right chest. There is complete collapse of the right middle lobe and right lower lobe. Small portion of the right upper lobe is still aerated. Left lung is clear. There is mild atelectasis at the left lung base. Upper Abdomen: No acute abnormality. Musculoskeletal: No acute bone abnormality. Review of the MIP images confirms the above findings. IMPRESSION: No evidence for pulmonary embolism. No evidence for aortic dissection. Large right pleural effusion causing collapse of the right middle lobe and right lower lobe. Only a small portion of the right upper lobe is aerated. In addition, there appears to be loculated fluid extending into the right side of the mediastinum. Electronically Signed   By: Markus Daft M.D.   On: 03/05/2016 14:42   Ct Angio Chest Pe W/cm &/or Wo Cm  Result Date: 02/18/2016 CLINICAL DATA:  Hemoptysis for 2-3 days.  Back pain. EXAM: CT ANGIOGRAPHY CHEST WITH CONTRAST TECHNIQUE: Multidetector CT imaging of the chest was performed using the standard protocol during bolus administration of intravenous contrast. Multiplanar CT image reconstructions and MIPs were obtained to evaluate the vascular anatomy. CONTRAST:  100 cc Isovue 370 intravenous COMPARISON:  None. FINDINGS: Cardiovascular: CTA of the pulmonary arteries is limited by motion and suboptimal opacification. Apparent filling defect in a subsegmental left upper lobe branch, 6:121, is an area of motion and favored artifactual. The low-density is also not at a branch point. No filling defect is identified. No cardiomegaly or pericardial effusion. No acute aortic finding. Diffuse atherosclerotic calcification of the aorta and coronaries. Mediastinum/Nodes: Prominent right paratracheal lymph node of 11 mm short axis, incidental in isolation.  Lungs/Pleura: There is atelectasis at the right base, multi segment, with a ~ 3 cm nonenhancing area in the lower lobe that has central lucency. There is small right pleural effusion that is small, with mild posterior chest loculation. No edema. Upper Abdomen: Hepatic steatosis Musculoskeletal: Degenerative changes without acute or aggressive finding. Review of the MIP images confirms the above findings. IMPRESSION: 1. Limited CTA with no definitive pulmonary embolism. There is an apparent filling defect within a subsegmental left upper lobe branch, but favored artifactual. Consider lower extremity Dopplers. 2. Right lower lobe atelectasis surrounding a nonenhancing cavitary focus, likely a cavitary pneumonia. Right pleural effusion is small but has areas of early loculation. 3. After completed treatment, recommend CT follow-up. Electronically Signed   By: Monte Fantasia M.D.   On: 02/18/2016 07:44   [place] Chest  Result Date: 02/19/2016 CLINICAL DATA:  Abnormal chest radiograph, RIGHT pleural effusion EXAM: CHEST ULTRASOUND COMPARISON:  Chest radiograph 02/19/2016 FINDINGS: Only a minimal amount of RIGHT pleural fluid is identified. Consolidated lower RIGHT lung is seen.  Volume of fluid visualized is insufficient for thoracentesis. IMPRESSION: RIGHT lower lobe consolidation. Minimal RIGHT pleural effusion. Discussed with Dr. Luan Pulling. Electronically Signed   By: Lavonia Dana M.D.   On: 02/19/2016 14:07   Dg Chest Port 1 View  Result Date: 03/13/2016 CLINICAL DATA:  Pneumonia. EXAM: PORTABLE CHEST 1 VIEW COMPARISON:  March 11, 2016 FINDINGS: Stable cardiomegaly. The left PICC line remains. The right central line has been removed. No pneumothorax. Mild edema. More focal infiltrate and probable associated effusion in the right base is similar in the interval. No other changes. IMPRESSION: 1. Opacity and effusion in the right base is stable. 2. Mild edema, unchanged. 3. No other changes. Electronically Signed    By: Dorise Bullion III M.D   On: 03/13/2016 07:57   Dg Chest Port 1 View  Result Date: 03/11/2016 CLINICAL DATA:  Post chest tube removal EXAM: PORTABLE CHEST 1 VIEW COMPARISON:  03/11/2016 FINDINGS: Right central venous catheter with tip over the mid SVC region. Left PICC line with tip over the central mediastinum, likely in the brachiocephalic vein. Interval removal of right chest tube. No significant pneumothorax. Small residual right pleural effusion with basilar atelectasis or consolidation. Infiltration or atelectasis also demonstrated in the left lung base. Mild cardiac enlargement without vascular congestion. Evaluation of mediastinum is limited due to patient rotation. IMPRESSION: Interval removal of right chest tube. No recurrent pneumothorax identified. Residual right pleural effusion and bilateral basilar atelectasis or infiltration. Electronically Signed   By: Lucienne Capers M.D.   On: 03/11/2016 21:15   Dg Chest Port 1 View  Result Date: 03/11/2016 CLINICAL DATA:  Onset of shortness of breath today. EXAM: PORTABLE CHEST 1 VIEW COMPARISON:  Portable chest x-ray of March 09, 2016 FINDINGS: There has been interval removal of the right upper chest tube. The pulmonary interstitial markings in both lungs remain increased. There is a persistent pleural effusion at the right lung base. The right basilar chest tube is stable in position. There is persistent increased density in the right lower lung and to a lesser extent at the left lung base. The cardiac silhouette is enlarged and the pulmonary vascularity more engorged today. The right internal jugular venous catheter tip projects over the midportion of the SVC. IMPRESSION: Mild interval worsening in the appearance of the pulmonary interstitium consistent with interstitial edema. Cardiomegaly. Persistent right basilar atelectasis and small to moderate pleural effusion. Mild left basilar subsegmental atelectasis. No pneumothorax following  removal of the upper right chest tube. Electronically Signed   By: David  Martinique M.D.   On: 03/11/2016 08:01   Dg Chest Port 1 View  Result Date: 03/09/2016 CLINICAL DATA:  75 year old male with acute respiratory failure and shortness of breath EXAM: PORTABLE CHEST 1 VIEW COMPARISON:  Prior chest x-ray 03/08/2016 FINDINGS: Patient has been extubated. Gastric tube is been removed. Stable position of right IJ central venous catheter with the tip overlying the mid SVC. Two right-sided chest tubes remain in place. The patient is significantly rotated toward the right. This distorts the cardiac and mediastinal contours. Overall, the degree of cardiomegaly remains unchanged. Persistent patchy airspace opacity throughout the right mid and lower lung. Blunting of the costophrenic angle again noted. There is some elevation the right hemidiaphragm. Mild atelectasis in the left lung base is slightly increased compared to prior. IMPRESSION: 1. Slightly increased left lower lobe subsegmental atelectasis. 2. Interval extubation and removal of gastric tube. Other support apparatus remain in unchanged position. 3. Similar changes in the right lung with  small residual pleural effusion versus chronic pleural thickening and atelectasis/infiltrate in the right mid and lower lung. Electronically Signed   By: Jacqulynn Cadet M.D.   On: 03/09/2016 09:09   Dg Chest Port 1 View  Result Date: 03/08/2016 CLINICAL DATA:  Hypoxia EXAM: PORTABLE CHEST 1 VIEW COMPARISON:  March 07, 2016 FINDINGS: Endotracheal tube tip is 6.3 cm above the carina. There are chest tubes on the right, unchanged in position. Central catheter tip is in the superior vena cava. Nasogastric tube tip and side port are below the diaphragm. No pneumothorax. There is airspace consolidation in the right mid and lower lung zones with small right pleural effusion, stable. Left lung is clear. Heart is upper normal in size with pulmonary vascularity within normal  limits. No adenopathy. No evident bone lesions. IMPRESSION: Tube and catheter positions as described without pneumothorax. Airspace consolidation right mid and lower lung zones, stable. No new opacity. Stable cardiac silhouette. Electronically Signed   By: Lowella Grip III M.D.   On: 03/08/2016 09:20   Dg Chest Port 1 View  Result Date: 03/07/2016 CLINICAL DATA:  Initial evaluation for acute hypoxia. EXAM: PORTABLE CHEST 1 VIEW COMPARISON:  Prior radiograph from earlier the same day. FINDINGS: Patient remains intubated with an endotracheal tube in place. Tip positioned approximately 5.3 cm above the carina. Enteric tube courses in the the abdomen. Right IJ approach since venous catheter in stable position with tip overlying the distal SVC. Additional left-sided PICC catheter also in stable position with tip overlying the confluence of the brachiocephalic vein and SVC. Cardiac and mediastinal silhouettes are unchanged. Lungs are mildly hypoinflated. Right-sided pleural effusion appears worsened from prior study. Slightly worsened opacity within the right mid and lower lung, likely atelectasis and/or fluid. No overt pulmonary edema. Right-sided chest tube remains in place with tip at the right lung apex. No appreciable pneumothorax. Minimal left basilar atelectasis. Osseous structures unchanged. IMPRESSION: 1. Interval worsening in right pleural effusion with increased opacity within the right mid and lower lung, likely atelectasis and/or effusion. 2. Right-sided chest tube in place with tip overlying the right lung apex. No appreciable pneumothorax. 3. Remaining support apparatus in satisfactory stable position. Electronically Signed   By: Jeannine Boga M.D.   On: 03/07/2016 18:08   Dg Chest Port 1 View  Result Date: 03/07/2016 CLINICAL DATA:  Postop VATS. EXAM: PORTABLE CHEST 1 VIEW COMPARISON:  03/05/2016 and CT 03/05/2016 FINDINGS: Patient is rotated to the right. Endotracheal tube has tip 4.3  cm above the carina. Right IJ central venous catheter has tip over the SVC. There is a left-sided PICC line unchanged with tip obliquely warranted at the junction of the left brachiocephalic vein to SVC. Right upper and right basilar chest tubes in place. Significant interval improvement in the previously seen large right pleural effusion. Persistent hazy opacification over the right mid to lower lung likely residual fluid/ atelectasis. Cannot exclude small loculated pneumothorax over the right base. Stable cardiomegaly. Remainder of the exam is unchanged. IMPRESSION: Moderate interval improvement in right-sided pleural effusion with residual opacification in the right middle to lower lung likely residual fluid/atelectasis. Cannot completely exclude small loculated lateral right basilar pneumothorax. Tubes and lines as described. Electronically Signed   By: Marin Olp M.D.   On: 03/07/2016 12:11   Dg Chest Port 1 View  Result Date: 02/29/2016 CLINICAL DATA:  RIGHT pleural effusion post thoracentesis EXAM: PORTABLE CHEST 1 VIEW COMPARISON:  Portable exam 1521 hours compared to 02/27/2016 chest radiograph and CT  chest of 02/29/2016 FINDINGS: Enlargement of cardiac silhouette with vascular congestion. Persistent moderate-sized RIGHT pleural effusion despite thoracentesis. No pneumothorax. Compressive atelectasis of RIGHT middle and RIGHT lower lobes. LEFT lung grossly clear. Osseous structures unremarkable. IMPRESSION: No pneumothorax following RIGHT thoracentesis. Persistent RIGHT pleural effusion with significant atelectasis of the lower RIGHT lung. Enlargement of cardiac silhouette with pulmonary vascular congestion. Electronically Signed   By: Lavonia Dana M.D.   On: 02/29/2016 15:38   Dg Chest Port 1 View  Result Date: 02/27/2016 CLINICAL DATA:  Pneumonia. EXAM: PORTABLE CHEST 1 VIEW COMPARISON:  02/26/2016. FINDINGS: Stable enlarged cardiac silhouette no significant change in a moderate to large-sized  right pleural effusion. The left lung remains clear. The left PICC tip remains in the region of the origin of the superior vena cava. Unremarkable bones. IMPRESSION: 1. Stable moderate to large-sized right pleural effusion and cardiomegaly. 2. Left PICC tip at the origin of the superior vena cava. Electronically Signed   By: Claudie Revering M.D.   On: 02/27/2016 10:17   Dg Chest Port 1 View  Result Date: 02/26/2016 CLINICAL DATA:  Pt couldn't verbally explain reason for exam. Tech and nurse techs assisted to try and stable pt upright to get sufficient AP view of chest. Reason for exam is pneumonia. EXAM: PORTABLE CHEST 1 VIEW COMPARISON:  02/22/2016 FINDINGS: Opacity at the right lung base has increased consistent with enlargement of the right pleural effusion, now moderate to large in size. No convincing left pleural effusion. No pneumothorax. No evidence of pulmonary edema.  No definite pneumonia. Cardiac silhouette is enlarged but stable. Left PICC has its tip at the left brachiocephalic vein superior vena cava confluence. IMPRESSION: 1. Increased right pleural effusion, now moderate to large. 2. Convincing pneumonia.  No pulmonary edema. Electronically Signed   By: Lajean Manes M.D.   On: 02/26/2016 10:04   Dg Chest Port 1 View  Result Date: 02/22/2016 CLINICAL DATA:  Respiratory failure. EXAM: PORTABLE CHEST 1 VIEW COMPARISON:  Radiographs of February 21, 2016. FINDINGS: Stable cardiomediastinal silhouette. Endotracheal and nasogastric tubes are in grossly good position. No pneumothorax is noted. Stable moderate right pleural effusion is noted with probable underlying atelectasis or infiltrate. Stable central pulmonary vascular congestion. Stable position of left subclavian catheter with distal tip in expected position of the SVC. Bony thorax is unremarkable. Stable minimal left basilar subsegmental atelectasis. IMPRESSION: Stable minimal left basilar subsegmental atelectasis. Stable moderate right  pleural effusion with probable underlying atelectasis or infiltrate. Support apparatus in grossly good position. Electronically Signed   By: Marijo Conception, M.D.   On: 02/22/2016 10:09   Dg Chest Port 1 View  Result Date: 02/21/2016 CLINICAL DATA:  Respiratory failure EXAM: PORTABLE CHEST 1 VIEW COMPARISON:  02/20/2016 FINDINGS: Cardiomegaly again noted. Stable NG tube and endotracheal tube position. Central vascular congestion. Persistent right pleural effusion with atelectasis or infiltrate in right lower lobe. Small left pleural effusion with left basilar atelectasis. No convincing pulmonary edema. Left arm PICC line is unchanged in position. IMPRESSION: Stable NG tube and endotracheal tube position. Central vascular congestion. Persistent right pleural effusion with atelectasis or infiltrate in right lower lobe. Small left pleural effusion with left basilar atelectasis. No convincing pulmonary edema. Electronically Signed   By: Lahoma Crocker M.D.   On: 02/21/2016 09:33   Dg Chest Port 1 View  Result Date: 02/20/2016 CLINICAL DATA:  Respiratory failure.  Evaluate endotracheal tube. EXAM: PORTABLE CHEST 1 VIEW COMPARISON:  02/19/2016. FINDINGS: Endotracheal tube is 5.6 cm above  the carina and appropriately positioned. A nasogastric tube extends into the abdomen. Lower chest is not completely visualized. Patient rotated towards the right. Left lung appears clear. There continues to be right pleural fluid which is probably loculated. Persistent densities at the right chest base could represent atelectasis. Heart size is grossly stable. IMPRESSION: Endotracheal tube is appropriately positioned above the carina. Persistent pleural-parenchymal disease in the right lung with volume loss. Electronically Signed   By: Markus Daft M.D.   On: 02/20/2016 11:11   Dg Chest Port 1 View  Result Date: 02/18/2016 CLINICAL DATA:  75 year old male with shortness of breath and hemoptysis. EXAM: PORTABLE CHEST 1 VIEW  COMPARISON:  Chest radiograph dated 08/20/2014 FINDINGS: There is shallow inspiration. Mild diffuse interstitial prominence and crowding as well as mild bronchiectatic changes. Right lung base hazy density likely atelectatic changes. Developing infiltrate is not excluded. Clinical correlation is recommended. There is no pleural effusion or pneumothorax. Stable mild cardiomegaly. No acute osseous pathology. IMPRESSION: Right lung base atelectatic changes versus less likely infiltrate. Clinical correlation is recommended. Electronically Signed   By: Anner Crete M.D.   On: 02/18/2016 06:23   Dg Chest Port 1v Same Day  Result Date: 02/20/2016 CLINICAL DATA:  Status post PICC line placement. EXAM: PORTABLE CHEST 1 VIEW COMPARISON:  02/20/2016 at 1050 hours FINDINGS: Endotracheal tube is 5 cm above the carina. Nasogastric tube extends to the abdomen but the tip is beyond the image. Again noted is right pleural fluid extending up the lateral aspect of the right chest. There continues to be consolidation and volume loss in the right lower lung. Stable enlargement of the cardiac silhouette. A left arm PICC line has been placed. Catheter tip has a horizontal orientation and probably at the junction of the upper SVC and left innominate vein. IMPRESSION: PICC line tip near the junction of the left innominate vein and SVC. Persistent pleural and parenchymal disease in the right chest. Minimal change from the recent comparison examination. Electronically Signed   By: Markus Daft M.D.   On: 02/20/2016 12:55   Dg Shoulder Right Portable  Result Date: 02/18/2016 CLINICAL DATA:  Initial evaluation for acute right shoulder pain. No injury. EXAM: PORTABLE RIGHT SHOULDER COMPARISON:  None. FINDINGS: No acute fracture or dislocation. Humeral head in normal alignment with the glenoid. AC joint approximated. Degenerative osteoarthritic changes present about the [facility] joint. No periarticular calcification. Osseous mineralization  normal. No soft tissue abnormality. IMPRESSION: 1. No acute osseous abnormality about the right shoulder. 2. 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 Electronically Signed   By: Jeannine Boga M.D.   On: 02/18/2016 06:24   Ct Renal Stone Study  Result Date: 02/18/2016 CLINICAL DATA:  Acute right flank pain. EXAM: CT ABDOMEN AND PELVIS WITHOUT CONTRAST TECHNIQUE: Multidetector CT imaging of the abdomen and pelvis was performed following the standard protocol without IV contrast. However, exam is somewhat limited due to respiratory motion artifact. COMPARISON:  None. FINDINGS: Lower chest: Right posterior basilar atelectasis or inflammation is noted with possible associated pleural effusion. Left lung base is clear. Hepatobiliary: No gallstones are noted. Possible mild fatty infiltration is noted of the liver. Pancreas: Normal. Spleen: Normal. Adrenals/Urinary Tract: Adrenal glands and kidneys are unremarkable. No hydronephrosis or renal obstruction is noted. No renal or ureteral calculi are noted. Urinary bladder appears normal. Stomach/Bowel: There is no evidence of bowel obstruction. However, there does appear to be pneumatosis involving a portion of the transverse colon as well as a  portion of the proximal descending colon. This potentially may represent benign pneumatosis, but ischemic bowel must be considered. Vascular/Lymphatic: Atherosclerosis of abdominal aorta is noted without aneurysm formation. No significant adenopathy is noted. Reproductive: Normal prostate gland. Other: No abnormal fluid collection is noted. 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 Possible pars defects is seen at L5. Well-defined lucencies are noted in the L3 and L4 vertebral bodies which may represent hemangiomas, but metastatic disease cannot be excluded. IMPRESSION: No hydronephrosis or renal obstruction is noted. No renal or ureteral  calculi are noted. Right posterior basilar atelectasis or inflammation is noted with possible associated pleural effusion. Probable mild fatty infiltration of the liver. Aortic atherosclerosis. Well-defined lucencies are noted in the L3 and L4 vertebral bodies which may represent hemangiomas, but MRI with and without gadolinium is recommended of the lumbar spine to rule out malignancy or metastatic disease. Pneumatosis is noted in the transverse colon as well as proximal descending colon. While this may represent benign pneumatosis, ischemic bowel cannot be excluded and clinical correlation and surgical consultation is recommended. Critical Value/emergent results were called by telephone at the time of interpretation on 02/18/2016 at 7:24 am to Dr. Rolland Porter , who verbally acknowledged these results. Electronically Signed   By: Marijo Conception, M.D.   On: 02/18/2016 07:25   Ct Angio Abd/pel W And/or Wo Contrast  Addendum Date: 02/18/2016   ADDENDUM REPORT: 02/18/2016 12:41 ADDENDUM: Small right pleural effusion with compressive atelectasis at the right lung base. There is a small focus of low-density and gas within this collapsed right lower lobe. This is seen on sequence 5, image 9. This area measures roughly 2.2 cm. This could represent a small focus of lung necrosis or cavitary pneumonia. This was seen on the recent chest CT from 02/18/2016. Electronically Signed   By: Markus Daft M.D.   On: 02/18/2016 12:41   Result Date: 02/18/2016 CLINICAL DATA:  Follow-up colonic pneumatosis. EXAM: CT ANGIOGRAPHY ABDOMEN AND PELVIS WITH CONTRAST AND WITHOUT CONTRAST TECHNIQUE: Multidetector CT imaging of the abdomen and pelvis was performed using the standard protocol during bolus administration of intravenous contrast. Multiplanar reconstructed images and MIPs were obtained and reviewed to evaluate the vascular anatomy. CONTRAST:  150mL ISOVUE-300 IOPAMIDOL (ISOVUE-300) INJECTION 61% COMPARISON:  CT 02/18/2016  FINDINGS: VASCULAR Aorta: Poor opacification of the arterial structures due to technical difficulties with this examination. Normal caliber of the abdominal aorta with atherosclerotic disease. No evidence for an aortic dissection. Celiac: Celiac trunk is patent without significant stent stenosis at the origin. The main branch vessels are patent. SMA: The SMA is patent with some atherosclerotic plaque at the origin. There does not appear to be a critical stenosis but limited evaluation. Renals: Bilateral renal arteries are patent with calcified plaque at the origin. There does not appear to be critical stenosis but limited evaluation. IMA: Proximal IMA appears to be patent. Inflow: Iliac arteries are calcified without significant aneurysm. Iliac arteries and proximal femoral arteries appear to be patent. Veins: IVC, renal veins and portal venous system are patent. Review of the MIP images confirms the above findings. NON-VASCULAR Lower chest: Again noted is atelectasis and/or scarring at the left lung base. Small right pleural effusion with compressive atelectasis in the right lower lobe. Coronary artery calcifications. Hepatobiliary: Normal appearance of the liver, gallbladder and portal venous system. Pancreas: Normal appearance of the pancreas without inflammation or duct dilatation. Spleen: Normal appearance of spleen without enlargement. Adrenals/Urinary Tract: Normal adrenal glands.  Probable cyst in the left kidney interpolar region without hydronephrosis. Normal appearance of the right kidney without hydronephrosis. Moderate distention of the urinary bladder without gross abnormality. Stomach/Bowel: Again noted is pneumatosis involving the transverse colon and a segment of the descending colon. There is no significant colonic wall thickening. No evidence for small bowel dilatation or obstruction. Cecum is located in the right upper abdomen and difficult to exclude some pneumatosis involving the cecum.  Lymphatic: No significant lymphadenopathy in the abdomen or pelvis. Reproductive: Prostate is unremarkable. Other: No evidence for abdominal or pelvic ascites. There is no free intraperitoneal air. No evidence for portal gas. 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 There is a large lucency involving the L4 vertebral body which may be related to a Schmorl's node. There is also large low-density structure in the posterior aspect of the L3 vertebral body which could be related to a large Schmorl's node. IMPRESSION: VASCULAR Limited evaluation of the aorta and visceral arteries due to poor contrast opacification and technical issues with this examination. No gross abnormality to the aorta such as an aneurysm or dissection. Main visceral arteries appear to be patent without significant stenosis. NON-VASCULAR Stable colonic pneumatosis. The pneumatosis is involving the transverse colon and a segment of the descending colon. No significant change from the previous examination and etiology is unknown. No focal bowel wall thickening. No free intraperitoneal air. Again noted are lucent lesions within the L3 and L4 vertebral bodies. These could be related to very large Schmorl's nodes but indeterminate. Electronically Signed: By: Markus Daft M.D. On: 02/18/2016 12:22   US Thoracentesis Asp Pleural Space W/img Guide  Result Date: 02/29/2016 INDICATION: RIGHT pleural effusion EXAM: ULTRASOUND GUIDED RIGHT THORACENTESIS MEDICATIONS: None. COMPLICATIONS: None immediate. PROCEDURE: Procedure, benefits, and risks of procedure were discussed with patient. Written informed consent for procedure was obtained. Time out protocol followed. Pleural effusion localized by ultrasound at the posterior RIGHT hemithorax. Skin prepped and draped in usual sterile fashion. Skin and soft tissues anesthetized with 10 mL of 1% lidocaine. 8 French thoracentesis catheter placed into the RIGHT pleural  space. 800 mL of dark old bloody RIGHT pleural aspirated by syringe pump. Procedure tolerated well by patient without immediate complication. FINDINGS: A total of approximately 800 ml of RIGHT pleural fluid was removed. Samples were sent to the laboratory as requested by the clinical team. IMPRESSION: Successful ultrasound guided RIGHT thoracentesis yielding 800 mL of pleural fluid. Electronically Signed   By: Lavonia Dana M.D.   On: 02/29/2016 15:18   Discharge Exam: Vitals:   03/15/16 0614 03/15/16 0923  BP: (!) 153/91 (!) 149/57  Pulse: (!) 103 (!) 108  Resp: 18 18  Temp: 99.2 F (37.3 C) 98.4 F (36.9 C)   Vitals:   03/14/16 2149 03/15/16 0614 03/15/16 0829 03/15/16 0923  BP: 129/62 (!) 153/91  (!) 149/57  Pulse: (!) 112 (!) 103  (!) 108  Resp: 17 18  18   Temp: 99 F (37.2 C) 99.2 F (37.3 C)  98.4 F (36.9 C)  TempSrc: Oral Oral  Oral  SpO2: 94% 93%  90%  Weight:   (!) 142.4 kg (314 lb)   Height:         General: Pt is obese, alert, awake, not in acute distress Cardiovascular: irregular rhythm, tachycardic, S1/S2 +, no rubs, no gallops Respiratory: CTA bilaterally, no wheezing, no rhonchi, poor inspiratory effort  Abdominal: Soft, NT, ND, bowel sounds + Extremities: no edema, no cyanosis, bilateral  legs in foam boots    The results of significant diagnostics from this hospitalization (including imaging, microbiology, ancillary and laboratory) are listed below for reference.     Microbiology: Recent Results (from the past 240 hour(s))  Surgical PCR screen     Status: Abnormal   Collection Time: 03/06/16 10:17 PM  Result Value Ref Range Status   MRSA, PCR POSITIVE (A) NEGATIVE Final    Comment: RESULT CALLED TO, READ BACK BY AND VERIFIED WITH: STOWE,S RN 0310 03/07/16 MITCHELL,L    Staphylococcus aureus POSITIVE (A) NEGATIVE Final    Comment:        The Xpert SA Assay (FDA approved for NASAL specimens in patients over 35 years of age), is one component of a  comprehensive surveillance program.  Test performance has been validated by [facility] for patients greater than or equal to 81 year old. It is not intended to diagnose infection nor to guide or monitor treatment.   Culture, body fluid-bottle     Status: None   Collection Time: 03/07/16  9:55 AM  Result Value Ref Range Status   Specimen Description FLUID RIGHT PLEURAL  Final   Special Requests PATIENT ON FOLLOWING  VANCOMYCIN  Final   Culture NO GROWTH 5 DAYS  Final   Report Status 03/12/2016 FINAL  Final  Gram stain     Status: None   Collection Time: 03/07/16  9:55 AM  Result Value Ref Range Status   Specimen Description FLUID RIGHT PLEURAL  Final   Special Requests PATIENT ON FOLLOWING  VANCOMYCIN  Final   Gram Stain   Final    ABUNDANT WBC PRESENT,BOTH PMN AND MONONUCLEAR NO ORGANISMS SEEN    Report Status 03/07/2016 FINAL  Final  Aerobic/Anaerobic Culture (surgical/deep wound)     Status: None   Collection Time: 03/07/16 10:04 AM  Result Value Ref Range Status   Specimen Description TISSUE RIGHT PLEURAL  Final   Special Requests   Final    RIGHT PLEURAL PEEL SPECIMEN C PATIENT ON FOLLOWING  VANCOMYCIN   Gram Stain   Final    MODERATE WBC PRESENT,BOTH PMN AND MONONUCLEAR NO ORGANISMS SEEN    Culture NO GROWTH 5 DAYS  Final   Report Status 03/12/2016 FINAL  Final     Labs: BNP (last 3 results)  Recent Labs  02/18/16 0517  BNP [id number]   Basic Metabolic Panel:  Recent Labs Lab 03/10/16 0537 03/11/16 0440 03/12/16 0600 03/13/16 0510 03/15/16 0506  NA 137 139 140 138 138  K 3.6 3.0* 3.0* 3.6 3.1*  CL 107 106 105 103 104  CO2 26 29 27 27 27   GLUCOSE 127* 148* 109* 154* 149*  BUN 6 6 <5* <5* <5*  CREATININE 0.57* 0.55* 0.52* 0.51* 0.52*  CALCIUM 7.4* 7.2* 7.3* 7.4* 7.6*  MG  --  1.8  --   --  1.5*   Liver Function Tests:  Recent Labs Lab 03/09/16 0415 03/10/16 0537 03/12/16 0600  AST 17 16 16   ALT 16* 17 15*  ALKPHOS 65 74 79  BILITOT 1.1 0.6 0.5   PROT 4.4* 4.5* 4.1*  ALBUMIN 1.6* 1.5* 1.5*   No results for input(s): LIPASE, AMYLASE in the last 168 hours. No results for input(s): AMMONIA in the last 168 hours. CBC:  Recent Labs Lab 03/11/16 0440 03/11/16 1826 03/12/16 0600 03/12/16 1700 03/13/16 0510 03/15/16 0506  WBC 9.1 7.8 7.2 7.4 8.1 7.3  NEUTROABS 7.1  --   --   --   --   --  HGB 7.4* 7.7* 7.1* 8.4* 8.5* 9.3*  HCT 23.1* 24.3* 22.2* 26.6* 26.9* 29.6*  MCV 93.1 92.4 93.3 92.4 92.1 92.5  PLT 233 249 245 244 242 246   Cardiac Enzymes: No results for input(s): CKTOTAL, CKMB, CKMBINDEX, TROPONINI in the last 168 hours. BNP: Invalid input(s): POCBNP CBG:  Recent Labs Lab 03/14/16 1136 03/14/16 1714 03/14/16 2147 03/15/16 0810 03/15/16 1203  GLUCAP 129* 155* 186* 159* 198*   D-Dimer No results for input(s): DDIMER in the last 72 hours. Hgb A1c No results for input(s): HGBA1C in the last 72 hours. Lipid Profile No results for input(s): CHOL, HDL, LDLCALC, TRIG, CHOLHDL, LDLDIRECT in the last 72 hours. Thyroid function studies No results for input(s): TSH, T4TOTAL, T3FREE, THYROIDAB in the last 72 hours.  Invalid input(s): FREET3 Anemia work up No results for input(s): VITAMINB12, FOLATE, FERRITIN, TIBC, IRON, RETICCTPCT in the last 72 hours. Urinalysis    Component Value Date/Time   COLORURINE YELLOW 02/18/2016 1129   APPEARANCEUR CLEAR 02/18/2016 1129   LABSPEC 1.015 02/18/2016 1129   PHURINE 6.0 02/18/2016 1129   GLUCOSEU 500 (A) 02/18/2016 1129   HGBUR NEGATIVE 02/18/2016 1129   BILIRUBINUR NEGATIVE 02/18/2016 1129   KETONESUR 15 (A) 02/18/2016 1129   PROTEINUR TRACE (A) 02/18/2016 1129   UROBILINOGEN 0.2 08/20/2014 1257   NITRITE NEGATIVE 02/18/2016 1129   LEUKOCYTESUR NEGATIVE 02/18/2016 1129   Sepsis Labs Invalid input(s): PROCALCITONIN,  WBC,  LACTICIDVEN Microbiology Recent Results (from the past 240 hour(s))  Surgical PCR screen     Status: Abnormal   Collection Time: 03/06/16  10:17 PM  Result Value Ref Range Status   MRSA, PCR POSITIVE (A) NEGATIVE Final    Comment: RESULT CALLED TO, READ BACK BY AND VERIFIED WITH: STOWE,S RN 0310 03/07/16 MITCHELL,L    Staphylococcus aureus POSITIVE (A) NEGATIVE Final    Comment:        The Xpert SA Assay (FDA approved for NASAL specimens in patients over 72 years of age), is one component of a comprehensive surveillance program.  Test performance has been validated by [facility] for patients greater than or equal to 59 year old. It is not intended to diagnose infection nor to guide or monitor treatment.   Culture, body fluid-bottle     Status: None   Collection Time: 03/07/16  9:55 AM  Result Value Ref Range Status   Specimen Description FLUID RIGHT PLEURAL  Final   Special Requests PATIENT ON FOLLOWING  VANCOMYCIN  Final   Culture NO GROWTH 5 DAYS  Final   Report Status 03/12/2016 FINAL  Final  Gram stain     Status: None   Collection Time: 03/07/16  9:55 AM  Result Value Ref Range Status   Specimen Description FLUID RIGHT PLEURAL  Final   Special Requests PATIENT ON FOLLOWING  VANCOMYCIN  Final   Gram Stain   Final    ABUNDANT WBC PRESENT,BOTH PMN AND MONONUCLEAR NO ORGANISMS SEEN    Report Status 03/07/2016 FINAL  Final  Aerobic/Anaerobic Culture (surgical/deep wound)     Status: None   Collection Time: 03/07/16 10:04 AM  Result Value Ref Range Status   Specimen Description TISSUE RIGHT PLEURAL  Final   Special Requests   Final    RIGHT PLEURAL PEEL SPECIMEN C PATIENT ON FOLLOWING  VANCOMYCIN   Gram Stain   Final    MODERATE WBC PRESENT,BOTH PMN AND MONONUCLEAR NO ORGANISMS SEEN    Culture NO GROWTH 5 DAYS  Final   Report Status  03/12/2016 FINAL  Final     Time coordinating discharge: Over 30 minutes  SIGNED:  Dessa Phi, DO [facility] Pager [phone]  If 7PM-7AM, please contact night-coverage [email or web address] Password [facility] 03/15/2016, 12:53 PM

## 2016-03-15 NOTE — Progress Notes (Signed)
MD contacted regards Foley catheter removal, RN instructed to let day shift nurse know, so attending can make appropriate decision.

## 2016-03-30 ENCOUNTER — Ambulatory Visit: Payer: Medicare Other | Admitting: Surgery

## 2016-04-04 ENCOUNTER — Encounter: Payer: Self-pay | Admitting: Gastroenterology

## 2016-04-04 ENCOUNTER — Ambulatory Visit (INDEPENDENT_AMBULATORY_CARE_PROVIDER_SITE_OTHER): Payer: Medicare Other | Admitting: Gastroenterology

## 2016-04-04 DIAGNOSIS — K264 Chronic or unspecified duodenal ulcer with hemorrhage: Secondary | ICD-10-CM | POA: Insufficient documentation

## 2016-04-04 NOTE — Progress Notes (Signed)
Primary Care Physician:  Asencion Noble, MD  Primary Gastroenterologist:  Garfield Cornea, MD   Chief Complaint  Patient presents with  . GI Bleeding    had an EGD at [facility] approx 2 weeks ago    HPI:  Richard Fields is a 75 y.o. male here for hospital follow up. He recently was discharged after nearly one month hospitalization for right sided cavitary PNA requiring intubation. Subsequently extubated but had enlarging right pleural effusion treated initially with thoracentesis. Patient also had dark stools/heme+stools but due to poor respiratory status it was elected to postpone EGD until prior to discharge. he remained clinically stable from that standpoint however he developed increasing right pleural effusion and respiratory demise and was transferred to [facility] for VATS procedure with chest tube placement on 11/6 for large empyema. Patient did develop recurrent UGI bleed and ultimately underwent EGD on 03/14/16 by Dr. Ival Bible which revealed focal erythema in the stomach (benign bx with no H.pylori) and 5mm non-bleeding cratered duodenal ulcer with no stigmata of bleeding. He was treated with PPI and Carafate. Dr. Ival Bible recommended f/u with [facility] for determination if 3 month surveillance EGD recommended.   Patient presents today from SNF where he was discharged due to deconditioning. He has no complaints. No abdominal pain, bowel concerns, heartburn. No melena, brbpr. He is still unable to stand or walk, requiring lift. Appetite good. He is still on carafate and Pantoprazole BID.      Current Outpatient Prescriptions  Medication Sig Dispense Refill  . atorvastatin (LIPITOR) 10 MG tablet Take 10 mg by mouth daily.     . benzonatate (TESSALON) 200 MG capsule Take 200 mg by mouth 3 (three) times daily as needed for cough.    . metFORMIN (GLUCOPHAGE) 500 MG tablet Take 1 tablet by mouth 2 (two) times daily.    . mirtazapine (REMERON) 15 MG tablet Take 15 mg by mouth at bedtime.     . pantoprazole  (PROTONIX) 40 MG tablet Take 1 tablet (40 mg total) by mouth 2 (two) times daily before a meal. 60 tablet 1  . pregabalin (LYRICA) 150 MG capsule Take 150 mg by mouth 2 (two) times daily.    . sucralfate (CARAFATE) 1 GM/10ML suspension Take 10 mLs (1 g total) by mouth 4 (four) times daily -  with meals and at bedtime. 420 mL 0   No current facility-administered medications for this visit.     Allergies as of 04/04/2016 - Review Complete 04/04/2016  Allergen Reaction Noted  . Penicillins Other (See Comments) 05/21/2012  . Tegretol [carbamazepine] Itching 05/21/2012  . Cefepime Rash 03/10/2016    Past Medical History:  Diagnosis Date  . Cervical disc disease   . Chronic constipation   . Diabetes mellitus without complication ([place])    Borderline diabetic-diet controlled  . Duodenal ulcer 03/2016  . Empyema ([place]) 01/2016  . Hyperlipidemia   . Hypertension   . Peripheral neuropathy ([facility])   +  Past Surgical History:  Procedure Laterality Date  . COLONOSCOPY  2003   RMR: internal hemorrhoids, otherwise normal  . COLONOSCOPY  05/30/2012   Procedure: COLONOSCOPY;  Surgeon: Daneil Dolin, MD;  Location: [facility];  Service: Endoscopy;  Laterality: N/A;  12:00  . ESOPHAGOGASTRODUODENOSCOPY (EGD) WITH PROPOFOL N/A 03/14/2016   Procedure: ESOPHAGOGASTRODUODENOSCOPY (EGD) WITH PROPOFOL;  Surgeon: Ronald Lobo, MD;  Location: [facility] ENDOSCOPY;  Service: Endoscopy;  Laterality: N/A;  . FLEXIBLE BRONCHOSCOPY N/A 03/07/2016   Procedure: FLEXIBLE VIDEO BRONCHOSCOPY;  Surgeon: Gaye Pollack,  MD;  Location: [facility] OR;  Service: Thoracic;  Laterality: N/A;  . HERNIA REPAIR    . TONSILLECTOMY    . VIDEO ASSISTED THORACOSCOPY (VATS)/THOROCOTOMY Right 03/07/2016   Procedure: VIDEO ASSISTED THORACOSCOPY (VATS);  Surgeon: Gaye Pollack, MD;  Location: [facility] OR;  Service: Thoracic;  Laterality: Right;    Family History  Problem Relation Age of Onset  . Cancer Father   . Colon cancer Paternal Uncle   .  Diabetes Brother     Social History   Social History  . Marital status: Married    Spouse name: N/A  . Number of children: N/A  . Years of education: N/A   Occupational History  . [employer]     retired   Social History Main Topics  . Smoking status: Former Smoker    Years: 40.00  . Smokeless tobacco: Former Systems developer    Types: Chew  . Alcohol use No  . Drug use: No  . Sexual activity: Not on file   Other Topics Concern  . Not on file   Social History Narrative  . No narrative on file      ROS:  General: Negative for anorexia, weight loss, fever, chills,+ fatigue,+ weakness. Eyes: Negative for vision changes.  ENT: Negative for hoarseness, difficulty swallowing , nasal congestion. CV: Negative for chest pain, angina, palpitations, dyspnea on exertion, peripheral edema.  Respiratory: Negative for dyspnea at rest, dyspnea on exertion,  sputum, wheezing.+cough  GI: See history of present illness. GU:  Negative for dysuria, hematuria, urinary incontinence, urinary frequency, nocturnal urination.  MS: Negative for joint pain, +low back pain.  Derm: Negative for rash or itching.  Neuro: Negative for weakness, abnormal sensation, seizure, frequent headaches, memory loss, confusion.  Psych: Negative for anxiety, depression, suicidal ideation, hallucinations.  Endo: Negative for unusual weight change.  Heme: Negative for bruising or bleeding. Allergy: Negative for rash or hives.    Physical Examination:  BP 125/81   Pulse (!) 121   Temp 97.4 F (36.3 C) (Oral)   Ht 6' (1.829 m)    General: Well-nourished, well-developed in no acute distress. Chronically ill-apearing Head: Normocephalic, atraumatic.   Eyes: Conjunctiva pink, no icterus. Mouth: Oropharyngeal mucosa moist and pink , no lesions erythema or exudate. Neck: Supple without thyromegaly, masses, or lymphadenopathy.  Lungs: Clear to auscultation bilaterally.  Heart: Regular rate and rhythm, no  murmurs rubs or gallops.  Abdomen: Bowel sounds are normal, nontender, nondistended, no hepatosplenomegaly or masses, no abdominal bruits or    hernia , no rebound or guarding.  Examined in wheelchair as patient unable to get on exam table Rectal: not performed Extremities: No lower extremity edema. No clubbing or deformities.  Neuro: Alert and oriented x 4 , grossly normal neurologically.  Skin: Warm and dry, no rash or jaundice.   Psych: Alert and cooperative, normal mood and affect.  Labs: Lab Results  Component Value Date   WBC 7.3 03/15/2016   HGB 9.3 (L) 03/15/2016   HCT 29.6 (L) 03/15/2016   MCV 92.5 03/15/2016   PLT 246 03/15/2016   Lab Results  Component Value Date   CREATININE 0.52 (L) 03/15/2016   BUN <5 (L) 03/15/2016   NA 138 03/15/2016   K 3.1 (L) 03/15/2016   CL 104 03/15/2016   CO2 27 03/15/2016   Lab Results  Component Value Date   ALT 15 (L) 03/12/2016   AST 16 03/12/2016   ALKPHOS 79 03/12/2016   BILITOT 0.5 03/12/2016  Imaging Studies: Dg Chest 2 View  Result Date: 04/06/2016 CLINICAL DATA:  [facility] ET es 03/07/2016.  Empyema. EXAM: CHEST  2 VIEW COMPARISON:  03/13/2016 FINDINGS: Elevation of the right hemidiaphragm. Continued right pleural effusion and right lower lobe atelectasis. Aeration at the right base slightly improved since prior study. No focal opacity on the left. Heart is normal size. IMPRESSION: Continued right pleural effusion and right base atelectasis with some improvement in aeration at the right base since prior study. Electronically Signed   By: Rolm Baptise M.D.   On: 04/06/2016 13:10   Dg Chest Port 1 View  Result Date: 03/13/2016 CLINICAL DATA:  Pneumonia. EXAM: PORTABLE CHEST 1 VIEW COMPARISON:  March 11, 2016 FINDINGS: Stable cardiomegaly. The left PICC line remains. The right central line has been removed. No pneumothorax. Mild edema. More focal infiltrate and probable associated effusion in the right base is similar in the  interval. No other changes. IMPRESSION: 1. Opacity and effusion in the right base is stable. 2. Mild edema, unchanged. 3. No other changes. Electronically Signed   By: Dorise Bullion III M.D   On: 03/13/2016 07:57   Dg Chest Port 1 View  Result Date: 03/11/2016 CLINICAL DATA:  Post chest tube removal EXAM: PORTABLE CHEST 1 VIEW COMPARISON:  03/11/2016 FINDINGS: Right central venous catheter with tip over the mid SVC region. Left PICC line with tip over the central mediastinum, likely in the brachiocephalic vein. Interval removal of right chest tube. No significant pneumothorax. Small residual right pleural effusion with basilar atelectasis or consolidation. Infiltration or atelectasis also demonstrated in the left lung base. Mild cardiac enlargement without vascular congestion. Evaluation of mediastinum is limited due to patient rotation. IMPRESSION: Interval removal of right chest tube. No recurrent pneumothorax identified. Residual right pleural effusion and bilateral basilar atelectasis or infiltration. Electronically Signed   By: Lucienne Capers M.D.   On: 03/11/2016 21:15

## 2016-04-04 NOTE — Patient Instructions (Signed)
1. I will discuss with Dr. Gala Romney regarding if repeat upper endoscopy is needed in two months to document ulcer healing.  2. We will check your hemoglobin (blood count) at the nursing home.

## 2016-04-06 ENCOUNTER — Ambulatory Visit (INDEPENDENT_AMBULATORY_CARE_PROVIDER_SITE_OTHER): Payer: Self-pay | Admitting: Surgery

## 2016-04-06 ENCOUNTER — Encounter: Payer: Self-pay | Admitting: Surgery

## 2016-04-06 ENCOUNTER — Ambulatory Visit: Payer: Medicare Other | Admitting: Surgery

## 2016-04-06 ENCOUNTER — Other Ambulatory Visit: Payer: Self-pay | Admitting: *Deleted

## 2016-04-06 ENCOUNTER — Ambulatory Visit
Admission: RE | Admit: 2016-04-06 | Discharge: 2016-04-06 | Disposition: A | Discharge status: Home / Self Care | Payer: Medicare Other | Source: Ambulatory Visit | Attending: Surgery | Admitting: Surgery

## 2016-04-06 VITALS — BP 124/87 | HR 125 | Resp 18 | Ht 72.0 in

## 2016-04-06 DIAGNOSIS — J869 Pyothorax without fistula: Secondary | ICD-10-CM

## 2016-04-06 DIAGNOSIS — Z09 Encounter for follow-up examination after completed treatment for conditions other than malignant neoplasm: Secondary | ICD-10-CM

## 2016-04-10 ENCOUNTER — Encounter: Payer: Self-pay | Admitting: Surgery

## 2016-04-10 ENCOUNTER — Encounter: Payer: Self-pay | Admitting: Gastroenterology

## 2016-04-10 NOTE — Progress Notes (Signed)
     HPI: Patient returns for routine postoperative follow-up having undergone right VATS for drainage of an empyema on 03/10/2016. He is here with his wife, who has some dementia and memory loss, and their friend who looks after them. The patient's early postoperative recovery while in the hospital was notable for a slow postop course due to his chronically debilitation. Since hospital discharge the patient reports slow improvement. He is in a SNF due to his chronic debilitation and deconditioning from his recent pneumonia and surgery. He is not ambulatory and using a wheelchair.   Current Outpatient Prescriptions  Medication Sig Dispense Refill  . atorvastatin (LIPITOR) 10 MG tablet Take 10 mg by mouth daily.     . benzonatate (TESSALON) 200 MG capsule Take 200 mg by mouth 3 (three) times daily as needed for cough.    . metFORMIN (GLUCOPHAGE) 500 MG tablet Take 1 tablet by mouth 2 (two) times daily.    . mirtazapine (REMERON) 15 MG tablet Take 15 mg by mouth at bedtime.     . pantoprazole (PROTONIX) 40 MG tablet Take 1 tablet (40 mg total) by mouth 2 (two) times daily before a meal. 60 tablet 1  . pregabalin (LYRICA) 150 MG capsule Take 150 mg by mouth 2 (two) times daily.    . sucralfate (CARAFATE) 1 GM/10ML suspension Take 10 mLs (1 g total) by mouth 4 (four) times daily -  with meals and at bedtime. 420 mL 0   No current facility-administered medications for this visit.     Physical Exam: BP 124/87 (BP Location: Left Arm, Patient Position: Sitting, Cuff Size: Large)   Pulse (!) 125   Resp 18   Ht 6' (1.829 m)   SpO2 97% Comment: ON RA He looks chronically ill and weak More alert and conversant than when he was in the hospital Lung exam reveals decreased breath sounds at the right base. The chest incisions are healing well  Diagnostic Tests:  CLINICAL DATA:  [facility] ET es 03/07/2016.  Empyema.  EXAM: CHEST  2 VIEW  COMPARISON:  03/13/2016  FINDINGS: Elevation of the right  hemidiaphragm. Continued right pleural effusion and right lower lobe atelectasis. Aeration at the right base slightly improved since prior study. No focal opacity on the left. Heart is normal size.  IMPRESSION: Continued right pleural effusion and right base atelectasis with some improvement in aeration at the right base since prior study.   Electronically Signed   By: Rolm Baptise M.D.   On: 04/06/2016 13:10   Impression:  He is stable after right VATS for drainage of a complex right pleural effusion. There is some elevation of the right hemidiaphragm and residual right pleural effusion and atelectasis but it looks much better than it did and should continue to improve. He is at increased risk for further problems due to his chronically debilitated state.  Plan:  He will continue to follow up with Dr. Willey Blade and I will be happy to see him back if the need arises.   Gaye Pollack, MD [facility] [phone]

## 2016-04-10 NOTE — Assessment & Plan Note (Signed)
75 y/o male with recent prolonged hospitalization for cavitary PNA requiring VATS and chest tube placement for large empyema who present for follow up. He has UGI bleed and required total of 3 units of prbcs per D/C summary. EGD with large duodenal ulcer, friable margin but no stigmata of bleeding. Gastric bx negative for H.pylori. EGD performed by Dr. Ival Bible. From a GI standpoint, he is doing well. No evidence of overt GI bleeding. We will recheck h/h at this point. I will discuss with Dr. Gala Romney to see if surveillance EGD recommended in this situation, generally it is not. May consider H.pylori serologies given DU. Further recommendations to follow. For now continue PPI BID but can decrease to once daily after one more month and continue once daily forever.

## 2016-04-11 NOTE — Progress Notes (Signed)
CC'D TO PCP °

## 2016-04-13 ENCOUNTER — Ambulatory Visit (INDEPENDENT_AMBULATORY_CARE_PROVIDER_SITE_OTHER): Payer: Medicare Other | Admitting: Orthopaedic Surgery

## 2016-04-13 DIAGNOSIS — M25562 Pain in left knee: Secondary | ICD-10-CM | POA: Diagnosis not present

## 2016-04-13 DIAGNOSIS — G8929 Other chronic pain: Secondary | ICD-10-CM

## 2016-04-13 DIAGNOSIS — M25561 Pain in right knee: Secondary | ICD-10-CM | POA: Diagnosis not present

## 2016-04-13 NOTE — Progress Notes (Signed)
CC: Both of my knees are hurting. I would like an injection in both knees.  The patient has had chronic pain and tenderness of both knees for some time.  Injections help.  There is no locking or giving way of the knee.  There is no new trauma. There is no redness or signs of infections.  The knees have a mild effusion and some crepitus.  There is no redness or signs of recent trauma.  Right knee ROM is 0-90 and left knee ROM is 0-95.  Impression:  Chronic pain of the both knees  Return:  2 months  PROCEDURE NOTE:  The patient requests injections of both knees, verbal consent was obtained.  The left and right knee were individually prepped appropriately after time out was performed.   Sterile technique was observed and injection of 1 cc of Depo-Medrol 40 mg with several cc's of plain xylocaine. Anesthesia was provided by ethyl chloride and a 20-gauge needle was used to inject each knee area. The injections were tolerated well.  A band aid dressing was applied.  The patient was advised to apply ice later today and tomorrow to the injection sight as needed.  Electronically Signed Sanjuana Kava, MD 12/13/20171:43 PM

## 2016-05-18 NOTE — Progress Notes (Signed)
Received labs done 04/07/16 at [facility] 10.9/35.7 improved.   Discussed with RMR, no need to necessarily repeat EGD to document duodenal ulcer healing. Advised No NSAIDS and f/u ov in couple of months. He has OV already scheduled.

## 2016-06-04 ENCOUNTER — Emergency Department (HOSPITAL_COMMUNITY): Admission: EM | Admit: 2016-06-04 | Discharge: 2016-06-04 | Discharge status: Absent without leave | Payer: Self-pay

## 2016-06-04 ENCOUNTER — Inpatient Hospital Stay (HOSPITAL_COMMUNITY)
Admission: EM | Admit: 2016-06-04 | Discharge: 2016-06-08 | DRG: 194 | Disposition: A | Discharge status: Skilled Nursing Facility | Payer: Medicare Other | Attending: Internal Medicine | Admitting: Internal Medicine

## 2016-06-04 ENCOUNTER — Emergency Department (HOSPITAL_COMMUNITY): Payer: Medicare Other

## 2016-06-04 ENCOUNTER — Encounter (HOSPITAL_COMMUNITY): Payer: Self-pay | Admitting: Emergency Medicine

## 2016-06-04 DIAGNOSIS — R509 Fever, unspecified: Secondary | ICD-10-CM | POA: Diagnosis present

## 2016-06-04 DIAGNOSIS — I482 Chronic atrial fibrillation: Secondary | ICD-10-CM | POA: Diagnosis present

## 2016-06-04 DIAGNOSIS — Z888 Allergy status to other drugs, medicaments and biological substances status: Secondary | ICD-10-CM

## 2016-06-04 DIAGNOSIS — Z880 Allergy status to penicillin: Secondary | ICD-10-CM

## 2016-06-04 DIAGNOSIS — I4891 Unspecified atrial fibrillation: Secondary | ICD-10-CM | POA: Diagnosis present

## 2016-06-04 DIAGNOSIS — Z6837 Body mass index (BMI) 37.0-37.9, adult: Secondary | ICD-10-CM | POA: Diagnosis not present

## 2016-06-04 DIAGNOSIS — J101 Influenza due to other identified influenza virus with other respiratory manifestations: Principal | ICD-10-CM

## 2016-06-04 DIAGNOSIS — E1165 Type 2 diabetes mellitus with hyperglycemia: Secondary | ICD-10-CM | POA: Diagnosis present

## 2016-06-04 DIAGNOSIS — E876 Hypokalemia: Secondary | ICD-10-CM | POA: Diagnosis present

## 2016-06-04 DIAGNOSIS — Z87891 Personal history of nicotine dependence: Secondary | ICD-10-CM | POA: Diagnosis not present

## 2016-06-04 DIAGNOSIS — E46 Unspecified protein-calorie malnutrition: Secondary | ICD-10-CM | POA: Diagnosis present

## 2016-06-04 DIAGNOSIS — D649 Anemia, unspecified: Secondary | ICD-10-CM | POA: Diagnosis present

## 2016-06-04 DIAGNOSIS — I10 Essential (primary) hypertension: Secondary | ICD-10-CM | POA: Diagnosis present

## 2016-06-04 DIAGNOSIS — Z79899 Other long term (current) drug therapy: Secondary | ICD-10-CM | POA: Diagnosis not present

## 2016-06-04 DIAGNOSIS — N390 Urinary tract infection, site not specified: Secondary | ICD-10-CM

## 2016-06-04 DIAGNOSIS — E785 Hyperlipidemia, unspecified: Secondary | ICD-10-CM | POA: Diagnosis present

## 2016-06-04 DIAGNOSIS — Z7984 Long term (current) use of oral hypoglycemic drugs: Secondary | ICD-10-CM

## 2016-06-04 DIAGNOSIS — K5909 Other constipation: Secondary | ICD-10-CM | POA: Diagnosis present

## 2016-06-04 DIAGNOSIS — Z8711 Personal history of peptic ulcer disease: Secondary | ICD-10-CM

## 2016-06-04 DIAGNOSIS — IMO0002 Reserved for concepts with insufficient information to code with codable children: Secondary | ICD-10-CM | POA: Diagnosis present

## 2016-06-04 DIAGNOSIS — J111 Influenza due to unidentified influenza virus with other respiratory manifestations: Secondary | ICD-10-CM

## 2016-06-04 LAB — CBC WITH DIFFERENTIAL/PLATELET
BASOS ABS: 0 10*3/uL (ref 0.0–0.1)
Basophils Relative: 1 %
EOS ABS: 0 10*3/uL (ref 0.0–0.7)
EOS PCT: 1 %
HEMATOCRIT: 36 % — AB (ref 39.0–52.0)
Hemoglobin: 11.8 g/dL — ABNORMAL LOW (ref 13.0–17.0)
Lymphocytes Relative: 10 %
Lymphs Abs: 0.5 10*3/uL — ABNORMAL LOW (ref 0.7–4.0)
MCH: 27.8 pg (ref 26.0–34.0)
MCHC: 32.8 g/dL (ref 30.0–36.0)
MCV: 84.7 fL (ref 78.0–100.0)
MONO ABS: 0.4 10*3/uL (ref 0.1–1.0)
Monocytes Relative: 9 %
NEUTROS ABS: 4.1 10*3/uL (ref 1.7–7.7)
Neutrophils Relative %: 80 %
PLATELETS: 186 10*3/uL (ref 150–400)
RBC: 4.25 MIL/uL (ref 4.22–5.81)
RDW: 17.7 % — AB (ref 11.5–15.5)
WBC: 5.1 10*3/uL (ref 4.0–10.5)

## 2016-06-04 LAB — INFLUENZA PANEL BY PCR (TYPE A & B)
INFLBPCR: NEGATIVE
Influenza A By PCR: POSITIVE — AB

## 2016-06-04 LAB — COMPREHENSIVE METABOLIC PANEL
ALBUMIN: 2.1 g/dL — AB (ref 3.5–5.0)
ALT: 31 U/L (ref 17–63)
ANION GAP: 8 (ref 5–15)
AST: 70 U/L — ABNORMAL HIGH (ref 15–41)
Alkaline Phosphatase: 78 U/L (ref 38–126)
BILIRUBIN TOTAL: 0.6 mg/dL (ref 0.3–1.2)
BUN: 6 mg/dL (ref 6–20)
CHLORIDE: 106 mmol/L (ref 101–111)
CO2: 28 mmol/L (ref 22–32)
Calcium: 6.7 mg/dL — ABNORMAL LOW (ref 8.9–10.3)
Creatinine, Ser: 0.59 mg/dL — ABNORMAL LOW (ref 0.61–1.24)
GFR calc Af Amer: >60 mL/min (ref >60)
GFR calc non Af Amer: >60 mL/min (ref >60)
Glucose, Bld: 144 mg/dL — ABNORMAL HIGH (ref 65–99)
POTASSIUM: 3.1 mmol/L — AB (ref 3.5–5.1)
Sodium: 142 mmol/L (ref 135–145)
TOTAL PROTEIN: 5.4 g/dL — AB (ref 6.5–8.1)

## 2016-06-04 LAB — URINALYSIS, ROUTINE W REFLEX MICROSCOPIC
Bilirubin Urine: NEGATIVE
Glucose, UA: NEGATIVE mg/dL
HGB URINE DIPSTICK: NEGATIVE
Ketones, ur: 5 mg/dL — AB
Nitrite: NEGATIVE
PROTEIN: 30 mg/dL — AB
SPECIFIC GRAVITY, URINE: 1.016 (ref 1.005–1.030)
pH: 6 (ref 5.0–8.0)

## 2016-06-04 LAB — I-STAT CG4 LACTIC ACID, ED: Lactic Acid, Venous: 2.02 mmol/L (ref 0.5–1.9)

## 2016-06-04 MED ORDER — SODIUM CHLORIDE 0.9 % IV BOLUS (SEPSIS)
1000.0000 mL | Freq: Once | INTRAVENOUS | Status: AC
  Administered 2016-06-04: 1000 mL via INTRAVENOUS

## 2016-06-04 MED ORDER — OSELTAMIVIR PHOSPHATE 75 MG PO CAPS
75.0000 mg | ORAL_CAPSULE | Freq: Once | ORAL | Status: AC
  Administered 2016-06-04: 75 mg via ORAL
  Filled 2016-06-04: qty 1

## 2016-06-04 MED ORDER — CIPROFLOXACIN IN D5W 400 MG/200ML IV SOLN
400.0000 mg | Freq: Once | INTRAVENOUS | Status: AC
  Administered 2016-06-04: 400 mg via INTRAVENOUS
  Filled 2016-06-04: qty 200

## 2016-06-04 NOTE — ED Provider Notes (Signed)
[facility] DEPT Provider Note   CSN: [id number] Arrival date & time: 06/04/16  [id number] By signing my name below, I, Dyke Brackett, attest that this documentation has been prepared under the direction and in the presence of Forde Dandy, MD . Electronically Signed: Dyke Brackett, Scribe. 06/04/2016. 8:40 PM.  History   Chief Complaint Chief Complaint  Patient presents with  . Fever   HPI Richard Fields is a 76 y.o. male with a PMHx of DM and HTN, brought in by ambulance from [place], who presents to the Emergency Department complaining of fever (tmax 102.9) onset today. Pt is unsure of how long he has been feeling ill, but states "I just don't feel good". He reports associated vomiting 1x and cough productive of clear sputum. No alleviating or modifying factors noted.  No prior treatments indicated. Per EMS, [facility] states pt may have a UTI. He reports normal PO intake. He does not typically wear oxygen while at home. He denies any SOB, CP, abdominal pain, diarrhea, dysuria, hematuria, urinary frequency, or any other associated symptoms at this time.   The history is provided by the patient. No language interpreter was used.    Past Medical History:  Diagnosis Date  . Cervical disc disease   . Chronic constipation   . Diabetes mellitus without complication ([place])    Borderline diabetic-diet controlled  . Duodenal ulcer 03/2016  . Empyema ([place]) 01/2016  . Hyperlipidemia   . Hypertension   . Peripheral neuropathy [facility])    Patient Active Problem List   Diagnosis Date Noted  . UTI (urinary tract infection) 06/04/2016  . Influenza A 06/04/2016  . Duodenal ulcer with hemorrhage 04/04/2016  . Uncontrolled type 2 diabetes mellitus with complication ([place])   . Atrial fibrillation ([place])   . Acute pain of right shoulder   . Empyema ([place]) 03/07/2016  . Abdominal pain, right lateral   . Aspirin long-term use   . Hypoxia   . Pleural effusion on right   . S/P thoracentesis   . SOB  (shortness of breath)   . Acute upper GI bleeding 02/28/2016  . Acute blood loss anemia 02/28/2016  . Protein-calorie malnutrition ([place]) 02/28/2016  . Acute respiratory failure with hypoxia ([place]) 02/26/2016  . Encephalopathy, metabolic 99991111  . Type II diabetes mellitus, uncontrolled ([place]) 02/24/2016  . Sepsis ([place]) 02/24/2016  . Elevated troponin level 02/24/2016  . Pressure injury of skin 02/19/2016  . Cavitary pneumonia 02/18/2016  . Hemoptysis 02/18/2016  . Morbid obesity ([place]) 02/18/2016  . 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 Hypertension 12/02/2015  . Screening 05/21/2012    Past Surgical History:  Procedure Laterality Date  . COLONOSCOPY  2003   RMR: internal hemorrhoids, otherwise normal  . COLONOSCOPY  05/30/2012   Procedure: COLONOSCOPY;  Surgeon: Daneil Dolin, MD;  [facility];  Service: Endoscopy;  Laterality: N/A;  12:00  . ESOPHAGOGASTRODUODENOSCOPY (EGD) WITH PROPOFOL N/A 03/14/2016   Procedure: ESOPHAGOGASTRODUODENOSCOPY (EGD) WITH PROPOFOL;  Surgeon: Ronald Lobo, MD;  Location: [facility] ENDOSCOPY;  Service: Endoscopy;  Laterality: N/A;  . FLEXIBLE BRONCHOSCOPY N/A 03/07/2016   Procedure: FLEXIBLE VIDEO BRONCHOSCOPY;  Surgeon: Gaye Pollack, MD;  Location: [facility];  Service: Thoracic;  Laterality: N/A;  . HERNIA REPAIR    . TONSILLECTOMY    . VIDEO ASSISTED THORACOSCOPY (VATS)/THOROCOTOMY Right 03/07/2016   Procedure: VIDEO ASSISTED THORACOSCOPY (VATS);  Surgeon: Gaye Pollack, MD;  Location: [facility];  Service: Thoracic;  Laterality: Right;    Home Medications  Prior to Admission medications   Medication Sig Start Date End Date Taking? Authorizing Provider  acetaminophen (TYLENOL) 650 MG CR tablet Take 650 mg by mouth every 8 (eight) hours as needed for pain.   Yes Historical Provider, MD  atorvastatin (LIPITOR) 10 MG tablet Take 10 mg by mouth daily.  05/10/12  Yes Historical Provider, MD  benzonatate (TESSALON) 200 MG capsule Take 200 mg by  mouth 3 (three) times daily as needed for cough.   Yes Historical Provider, MD  metFORMIN (GLUCOPHAGE) 500 MG tablet Take 1 tablet by mouth 2 (two) times daily. 02/08/16  Yes Historical Provider, MD  mirtazapine (REMERON) 15 MG tablet Take 15 mg by mouth at bedtime.  08/18/14  Yes Historical Provider, MD  pantoprazole (PROTONIX) 40 MG tablet Take 1 tablet (40 mg total) by mouth 2 (two) times daily before a meal. 03/15/16 06/04/16 Yes Jennifer Chahn-Yang Choi, DO  pregabalin (LYRICA) 150 MG capsule Take 150 mg by mouth 2 (two) times daily.   Yes Historical Provider, MD  traZODone (DESYREL) 50 MG tablet Take 100 mg by mouth at bedtime.   Yes Historical Provider, MD  sucralfate (CARAFATE) 1 GM/10ML suspension Take 10 mLs (1 g total) by mouth 4 (four) times daily -  with meals and at bedtime. 03/15/16 04/14/16  Shon Millet, DO    Family History Family History  Problem Relation Age of Onset  . Cancer Father   . Colon cancer Paternal Uncle   . Diabetes Brother     Social History Social History  Substance Use Topics  . Smoking status: Former Smoker    Years: 40.00  . Smokeless tobacco: Former Systems developer    Types: Chew  . Alcohol use No     Allergies   Penicillins; Tegretol [carbamazepine]; and Cefepime  Review of Systems Review of Systems 10 systems reviewed and all are negative for acute change except as noted in the HPI.  Physical Exam Updated Vital Signs BP 122/68   Pulse 114   Temp 98.9 F (37.2 C) (Oral)   Resp 20   Ht 6' (1.829 m)   SpO2 94%   Physical Exam Physical Exam  Nursing note and vitals reviewed. Constitutional: Low energy, non-toxic, and in no acute distress Head: Normocephalic and atraumatic.  Mouth/Throat: Oropharynx is clear and moist.  Neck: Normal range of motion. Neck supple.  Cardiovascular: Tachycardic rate and regular rhythm.   Pulmonary/Chest: Effort normal and breath sounds normal. Coarse bibasilar breath sounds. Abdominal: Obese. Soft.  There is no tenderness. There is no rebound and no guarding.  Musculoskeletal: Normal range of motion.  Neurological: Alert, no facial droop, fluent speech, moves all extremities symmetrically Skin: Skin is warm and dry.  Psychiatric: Cooperative   ED Treatments / Results  DIAGNOSTIC STUDIES:  Oxygen Saturation is 95% on [place], low by my interpretation.    COORDINATION OF CARE:  8:40 PM Discussed treatment plan with pt at bedside and pt agreed to plan.   Labs (all labs ordered are listed, but only abnormal results are displayed) Labs Reviewed  CBC WITH DIFFERENTIAL/PLATELET - Abnormal; Notable for the following:       Result Value   Hemoglobin 11.8 (*)    HCT 36.0 (*)    RDW 17.7 (*)    Lymphs Abs 0.5 (*)    All other components within normal limits  COMPREHENSIVE METABOLIC PANEL - Abnormal; Notable for the following:    Potassium 3.1 (*)    Glucose, Bld 144 (*)  Creatinine, Ser 0.59 (*)    Calcium 6.7 (*)    Total Protein 5.4 (*)    Albumin 2.1 (*)    AST 70 (*)    All other components within normal limits  URINALYSIS, ROUTINE W REFLEX MICROSCOPIC - Abnormal; Notable for the following:    APPearance CLOUDY (*)    Ketones, ur 5 (*)    Protein, ur 30 (*)    Leukocytes, UA LARGE (*)    Bacteria, UA MANY (*)    All other components within normal limits  INFLUENZA PANEL BY PCR (TYPE A & B) - Abnormal; Notable for the following:    Influenza A By PCR POSITIVE (*)    All other components within normal limits  I-STAT CG4 LACTIC ACID, ED - Abnormal; Notable for the following:    Lactic Acid, Venous 2.02 (*)    All other components within normal limits  CULTURE, BLOOD (ROUTINE X 2)  CULTURE, BLOOD (ROUTINE X 2)  URINE CULTURE  I-STAT CG4 LACTIC ACID, ED  I-STAT CG4 LACTIC ACID, ED  I-STAT CG4 LACTIC ACID, ED    EKG  EKG Interpretation  Date/Time:  Saturday June 04 2016 20:08:01 EST Ventricular Rate:  115 PR Interval:    QRS Duration: 71 QT Interval:  355 QTC  Calculation: 491 R Axis:   69 Text Interpretation:  Sinus tachycardia Nonspecific T abnormalities, diffuse leads Borderline prolonged QT interval similar to prior EKG  Confirmed by Kadynce Bonds MD, [place] [phone]) on 06/04/2016 10:41:48 PM       Radiology Dg Chest Port 1 View  Result Date: 06/04/2016 CLINICAL DATA:  Nausea vomiting and fever today. EXAM: PORTABLE CHEST 1 VIEW COMPARISON:  04/06/2016 FINDINGS: Unchanged right hemidiaphragm elevation and possible effusion. Mild airspace opacity adjacent to the right hemidiaphragm, probably atelectatic. Left lung is clear. Mild interstitial prominence. Unchanged hilar, mediastinal and cardiac contours. IMPRESSION: Mild interstitial prominence. Stable right hemidiaphragm elevation and possible right effusion. Electronically Signed   By: Andreas Newport M.D.   On: 06/04/2016 21:52    Procedures Procedures (including critical care time)  Medications Ordered in ED Medications  ondansetron (ZOFRAN) tablet 4 mg ( Oral See Alternative 06/05/16 0025)    Or  ondansetron (ZOFRAN) injection 4 mg (4 mg Intravenous Given 06/05/16 0025)  sodium chloride 0.9 % bolus 1,000 mL (0 mLs Intravenous Stopped 06/04/16 2047)  sodium chloride 0.9 % bolus 1,000 mL (0 mLs Intravenous Stopped 06/05/16 0006)  sodium chloride 0.9 % bolus 1,000 mL (0 mLs Intravenous Stopped 06/04/16 2150)  ciprofloxacin (CIPRO) IVPB 400 mg (0 mg Intravenous Stopped 06/05/16 0006)  oseltamivir (TAMIFLU) capsule 75 mg (75 mg Oral Given 06/04/16 2155)     Initial Impression / Assessment and Plan / ED Course  I have reviewed the triage vital signs and the nursing notes.  Pertinent labs & imaging results that were available during my care of the patient were reviewed by me and considered in my medical decision making (see chart for details).     Presenting with fever, cough, vomiting and fatigue. Low energy but not toxic and in no acute distress. Febrile and tachycardic, but no respiratory distress and  normotensive. Infectious work-up initiated.   With positive influenza, and given tamiflu. UA also with signs of infection, and he received ciprofloxacin. CXR visualized and without pneumonia or other acute cardiopulmonary processes. Minimally elevated lactate of 2.02 and no major leukocytosis.  Given IVF and he is with persistent tachycardia and feels poorly. Will admit for management of flu  and UTI. Discussed with Dr. Marin Comment.   Final Clinical Impressions(s) / ED Diagnoses   Final diagnoses:  Lower urinary tract infectious disease  Influenza    New Prescriptions New Prescriptions   No medications on file   I personally performed the services described in this documentation, which was scribed in my presence. The recorded information has been reviewed and is accurate.    Forde Dandy, MD 06/05/16 [id number]

## 2016-06-04 NOTE — H&P (Signed)
History and Physical    Richard Fields [id number] DOB: 14-Apr-1941 DOA: 06/04/2016  PCP: Asencion Noble, MD  Patient coming from: SNF.  [facility].    Chief Complaint:   Fever and malaise.   HPI: Richard Fields is an 76 y.o. male with hx of DM HTN, chronic constipation, resident of SNF [facility], brought to the ER with fever of 102.9, and feeling malaise.  He has chills, and non productive coughs.  Evaluation in the ER showed influenza A positive, along with a UA showing TNTC, with normal WBC and k of 3.1.  Normal renal Fx test.  His CXR is clear.   He was given IV Cipro, as he has allergy to PCN and cephalosporin, and given Tamiflu.  Hospitalist was asked to admit him for UTI and influenza.  Lactic acid is 2.2.  No hypotension.   ED Course:  See above.  Rewiew of Systems:  Constitutional: Negative for malaise, fever and chills. No significant weight loss or weight gain Eyes: Negative for eye pain, redness and discharge, diplopia, visual changes, or flashes of light. ENMT: Negative for ear pain, hoarseness, nasal congestion, sinus pressure and sore throat. No headaches; tinnitus, drooling, or problem swallowing. Cardiovascular: Negative for chest pain, palpitations, diaphoresis, dyspnea and peripheral edema. ; No orthopnea, PND Respiratory: Negative for  hemoptysis, wheezing and stridor. No pleuritic chestpain. Gastrointestinal: Negative for diarrhea, constipation,  melena, blood in stool, hematemesis, jaundice and rectal bleeding.    Genitourinary: Negative for frequency, dysuria, incontinence,flank pain and hematuria; Musculoskeletal: Negative for back pain and neck pain. Negative for swelling and trauma.;  Skin: . Negative for pruritus, rash, abrasions, bruising and skin lesion.; ulcerations Neuro: Negative for headache, lightheadedness and neck stiffness. Negative for weakness, altered level of consciousness , altered mental status, burning feet, involuntary movement, seizure and syncope.    Psych: negative for anxiety, depression, insomnia, tearfulness, panic attacks, hallucinations, paranoia, suicidal or homicidal ideation    Past Medical History:  Diagnosis Date  . Cervical disc disease   . Chronic constipation   . Diabetes mellitus without complication ([place])    Borderline diabetic-diet controlled  . Duodenal ulcer 03/2016  . Empyema ([place]) 01/2016  . Hyperlipidemia   . Hypertension   . Peripheral neuropathy [facility])     Past Surgical History:  Procedure Laterality Date  . COLONOSCOPY  2003   RMR: internal hemorrhoids, otherwise normal  . COLONOSCOPY  05/30/2012   Procedure: COLONOSCOPY;  Surgeon: Daneil Dolin, MD;  [facility];  Service: Endoscopy;  Laterality: N/A;  12:00  . ESOPHAGOGASTRODUODENOSCOPY (EGD) WITH PROPOFOL N/A 03/14/2016   Procedure: ESOPHAGOGASTRODUODENOSCOPY (EGD) WITH PROPOFOL;  Surgeon: Ronald Lobo, MD;  Location[facility] ENDOSCOPY;  Service: Endoscopy;  Laterality: N/A;  . FLEXIBLE BRONCHOSCOPY N/A 03/07/2016   Procedure: FLEXIBLE VIDEO BRONCHOSCOPY;  Surgeon: Gaye Pollack, MD;  Location: [facility];  Service: Thoracic;  Laterality: N/A;  . HERNIA REPAIR    . TONSILLECTOMY    . VIDEO ASSISTED THORACOSCOPY (VATS)/THOROCOTOMY Right 03/07/2016   Procedure: VIDEO ASSISTED THORACOSCOPY (VATS);  Surgeon: Gaye Pollack, MD;  Location: [facility] OR;  Service: Thoracic;  Laterality: Right;     reports that he has quit smoking. He quit after 40.00 years of use. He has quit using smokeless tobacco. His smokeless tobacco use included Chew. He reports that he does not drink alcohol or use drugs.  Allergies  Allergen Reactions  . Penicillins Other (See Comments)    Unknown-reaction is unknown  . Tegretol [Carbamazepine] Itching  .  Cefepime Rash    Family History  Problem Relation Age of Onset  . Cancer Father   . Colon cancer Paternal Uncle   . Diabetes Brother      Prior to Admission medications   Medication Sig Start Date End Date Taking?  Authorizing Provider  acetaminophen (TYLENOL) 650 MG CR tablet Take 650 mg by mouth every 8 (eight) hours as needed for pain.   Yes Historical Provider, MD  atorvastatin (LIPITOR) 10 MG tablet Take 10 mg by mouth daily.  05/10/12  Yes Historical Provider, MD  benzonatate (TESSALON) 200 MG capsule Take 200 mg by mouth 3 (three) times daily as needed for cough.   Yes Historical Provider, MD  metFORMIN (GLUCOPHAGE) 500 MG tablet Take 1 tablet by mouth 2 (two) times daily. 02/08/16  Yes Historical Provider, MD  mirtazapine (REMERON) 15 MG tablet Take 15 mg by mouth at bedtime.  08/18/14  Yes Historical Provider, MD  pantoprazole (PROTONIX) 40 MG tablet Take 1 tablet (40 mg total) by mouth 2 (two) times daily before a meal. 03/15/16 06/04/16 Yes Jennifer Chahn-Yang Choi, DO  pregabalin (LYRICA) 150 MG capsule Take 150 mg by mouth 2 (two) times daily.   Yes Historical Provider, MD  traZODone (DESYREL) 50 MG tablet Take 100 mg by mouth at bedtime.   Yes Historical Provider, MD  sucralfate (CARAFATE) 1 GM/10ML suspension Take 10 mLs (1 g total) by mouth 4 (four) times daily -  with meals and at bedtime. 03/15/16 04/14/16  Shon Millet, DO    Physical Exam: Vitals:   06/04/16 2145 06/04/16 2151 06/04/16 2200 06/04/16 2215  BP:   121/79   Pulse:      Resp: (!) 27  21 20   Temp:  98.9 F (37.2 C)    TempSrc:  Oral    SpO2:      Height:          Constitutional: NAD, calm, comfortable Vitals:   06/04/16 2145 06/04/16 2151 06/04/16 2200 06/04/16 2215  BP:   121/79   Pulse:      Resp: (!) 27  21 20   Temp:  98.9 F (37.2 C)    TempSrc:  Oral    SpO2:      Height:       Eyes: PERRL, lids and conjunctivae normal ENMT: Mucous membranes are moist. Posterior pharynx clear of any exudate or lesions.Normal dentition.  Neck: normal, supple, no masses, no thyromegaly Respiratory: clear to auscultation bilaterally, no wheezing, no crackles. Normal respiratory effort. No accessory muscle use.    Cardiovascular: Regular rate and rhythm, no murmurs / rubs / gallops. No extremity edema. 2+ pedal pulses. No carotid bruits.  Abdomen: no tenderness, no masses palpated. No hepatosplenomegaly. Bowel sounds positive.  Musculoskeletal: no clubbing / cyanosis. No joint deformity upper and lower extremities. Good ROM, no contractures. Normal muscle tone.  Skin: no rashes, lesions, ulcers. No induration Neurologic: CN 2-12 grossly intact. Sensation intact, DTR normal. Strength 5/5 in all 4.  Psychiatric: Normal judgment and insight. Alert and oriented x 3. Normal mood.   Labs on Admission: I have personally reviewed following labs and imaging studies CBC:  Recent Labs Lab 06/04/16 1935  WBC 5.1  NEUTROABS 4.1  HGB 11.8*  HCT 36.0*  MCV 84.7  PLT 99991111   Basic Metabolic Panel:  Recent Labs Lab 06/04/16 1935  NA 142  K 3.1*  CL 106  CO2 28  GLUCOSE 144*  BUN 6  CREATININE 0.59*  CALCIUM 6.7*  Liver Function Tests:  Recent Labs Lab 06/04/16 1935  AST 70*  ALT 31  ALKPHOS 78  BILITOT 0.6  PROT 5.4*  ALBUMIN 2.1*   Urine analysis:    Component Value Date/Time   COLORURINE YELLOW 06/04/2016 2021   APPEARANCEUR CLOUDY (A) 06/04/2016 2021   LABSPEC 1.016 06/04/2016 2021   PHURINE 6.0 06/04/2016 2021   GLUCOSEU NEGATIVE 06/04/2016 2021   HGBUR NEGATIVE 06/04/2016 2021   BILIRUBINUR NEGATIVE 06/04/2016 2021   KETONESUR 5 (A) 06/04/2016 2021   PROTEINUR 30 (A) 06/04/2016 2021   UROBILINOGEN 0.2 08/20/2014 1257   NITRITE NEGATIVE 06/04/2016 2021   LEUKOCYTESUR LARGE (A) 06/04/2016 2021    Recent Results (from the past 240 hour(s))  Blood Culture (routine x 2)     Status: None (Preliminary result)   Collection Time: 06/04/16  8:42 PM  Result Value Ref Range Status   Specimen Description LEFT ANTECUBITAL  Final   Special Requests BOTTLES DRAWN AEROBIC AND ANAEROBIC 4CC EACH  Final   Culture PENDING  Incomplete   Report Status PENDING  Incomplete  Blood Culture  (routine x 2)     Status: None (Preliminary result)   Collection Time: 06/04/16  8:52 PM  Result Value Ref Range Status   Specimen Description BLOOD LEFT HAND  Final   Special Requests BOTTLES DRAWN AEROBIC AND ANAEROBIC [facility] EACH  Final   Culture PENDING  Incomplete   Report Status PENDING  Incomplete     Radiological Exams on Admission: Dg Chest Port 1 View  Result Date: 06/04/2016 CLINICAL DATA:  Nausea vomiting and fever today. EXAM: PORTABLE CHEST 1 VIEW COMPARISON:  04/06/2016 FINDINGS: Unchanged right hemidiaphragm elevation and possible effusion. Mild airspace opacity adjacent to the right hemidiaphragm, probably atelectatic. Left lung is clear. Mild interstitial prominence. Unchanged hilar, mediastinal and cardiac contours. IMPRESSION: Mild interstitial prominence. Stable right hemidiaphragm elevation and possible right effusion. Electronically Signed   By: Andreas Newport M.D.   On: 06/04/2016 21:52    EKG: Independently reviewed.  Assessment/Plan Active Problems:   Hypertension   Morbid obesity ([place])   Type II diabetes mellitus, uncontrolled ([facility])   Atrial fibrillation ([place])   UTI (urinary tract infection)   Influenza A    PLAN:   UTI:  WIll continue with IV Cipro, given several allergies.  Check urine culture.  Not clinically septic.  Will admit him to the floor.  Continue IVF as required.   Influenza:  Precaution.  Continue Tamiflu.   DM:  Continue with his meds.  Use SSI as required.  Carb modified diet.   Afib: Rate is slightly fast.  Continue with BB.   HTN:  Continue with BP meds.    DVT prophylaxis: SubQ heparin.  Code Status: FULL CODE.  Family Communication: None.  Disposition Plan: To SNF when appropriate.  Consults called: None. Admission status: Inpatient.   Parmvir Boomer MD FACP. [facility]  If 7PM-7AM, please contact night-coverage [email or web address] Password [email or web address]  06/04/2016, 11:50 PM

## 2016-06-04 NOTE — ED Triage Notes (Addendum)
Pt brought in from [facility] by [facility] for Fever of 102.9 and 2 episodes of vomiting today. Per EMS, [facility] states pt mya have UTI.

## 2016-06-05 LAB — COMPREHENSIVE METABOLIC PANEL
ALK PHOS: 63 U/L (ref 38–126)
ALT: 27 U/L (ref 17–63)
AST: 61 U/L — ABNORMAL HIGH (ref 15–41)
Albumin: 1.8 g/dL — ABNORMAL LOW (ref 3.5–5.0)
Anion gap: 5 (ref 5–15)
BUN: 5 mg/dL — ABNORMAL LOW (ref 6–20)
CALCIUM: 6 mg/dL — AB (ref 8.9–10.3)
CO2: 28 mmol/L (ref 22–32)
CREATININE: 0.46 mg/dL — AB (ref 0.61–1.24)
Chloride: 106 mmol/L (ref 101–111)
GFR calc non Af Amer: >60 mL/min (ref >60)
Glucose, Bld: 139 mg/dL — ABNORMAL HIGH (ref 65–99)
Potassium: 3 mmol/L — ABNORMAL LOW (ref 3.5–5.1)
SODIUM: 139 mmol/L (ref 135–145)
Total Bilirubin: 0.6 mg/dL (ref 0.3–1.2)
Total Protein: 4.6 g/dL — ABNORMAL LOW (ref 6.5–8.1)

## 2016-06-05 LAB — CBC
HCT: 32.9 % — ABNORMAL LOW (ref 39.0–52.0)
Hemoglobin: 10.7 g/dL — ABNORMAL LOW (ref 13.0–17.0)
MCH: 27.6 pg (ref 26.0–34.0)
MCHC: 32.5 g/dL (ref 30.0–36.0)
MCV: 85 fL (ref 78.0–100.0)
PLATELETS: 161 10*3/uL (ref 150–400)
RBC: 3.87 MIL/uL — AB (ref 4.22–5.81)
RDW: 17.7 % — AB (ref 11.5–15.5)
WBC: 4.5 10*3/uL (ref 4.0–10.5)

## 2016-06-05 LAB — GLUCOSE, CAPILLARY
GLUCOSE-CAPILLARY: 147 mg/dL — AB (ref 65–99)
GLUCOSE-CAPILLARY: 137 mg/dL — AB (ref 65–99)
Glucose-Capillary: 118 mg/dL — ABNORMAL HIGH (ref 65–99)
Glucose-Capillary: 144 mg/dL — ABNORMAL HIGH (ref 65–99)
Glucose-Capillary: 124 mg/dL — ABNORMAL HIGH (ref 65–99)

## 2016-06-05 LAB — TSH: TSH: 0.865 u[IU]/mL (ref 0.350–4.500)

## 2016-06-05 LAB — MRSA PCR SCREENING: MRSA by PCR: POSITIVE — AB

## 2016-06-05 MED ORDER — HEPARIN SODIUM (PORCINE) 5000 UNIT/ML IJ SOLN
5000.0000 [IU] | Freq: Three times a day (TID) | INTRAMUSCULAR | Status: DC
  Administered 2016-06-05 – 2016-06-08 (×10): 5000 [IU] via SUBCUTANEOUS
  Filled 2016-06-05 (×10): qty 1

## 2016-06-05 MED ORDER — INSULIN ASPART 100 UNIT/ML ~~LOC~~ SOLN: 0.0000 [IU] | Freq: Every day | SUBCUTANEOUS | Status: DC

## 2016-06-05 MED ORDER — ORAL CARE MOUTH RINSE
15.0000 mL | Freq: Two times a day (BID) | OROMUCOSAL | Status: DC
  Administered 2016-06-05 – 2016-06-07 (×6): 15 mL via OROMUCOSAL

## 2016-06-05 MED ORDER — INSULIN ASPART 100 UNIT/ML ~~LOC~~ SOLN
0.0000 [IU] | Freq: Three times a day (TID) | SUBCUTANEOUS | Status: DC
  Administered 2016-06-06: 3 [IU] via SUBCUTANEOUS
  Administered 2016-06-07: 2 [IU] via SUBCUTANEOUS
  Administered 2016-06-07: 3 [IU] via SUBCUTANEOUS

## 2016-06-05 MED ORDER — CHLORHEXIDINE GLUCONATE CLOTH 2 % EX PADS
6.0000 | MEDICATED_PAD | Freq: Every day | CUTANEOUS | Status: DC
  Administered 2016-06-05 – 2016-06-08 (×4): 6 via TOPICAL

## 2016-06-05 MED ORDER — PREGABALIN 75 MG PO CAPS
150.0000 mg | ORAL_CAPSULE | Freq: Two times a day (BID) | ORAL | Status: DC
  Administered 2016-06-05 – 2016-06-08 (×8): 150 mg via ORAL
  Filled 2016-06-05 (×2): qty 2
  Filled 2016-06-05: qty 6
  Filled 2016-06-05 (×5): qty 2

## 2016-06-05 MED ORDER — OSELTAMIVIR PHOSPHATE 75 MG PO CAPS
75.0000 mg | ORAL_CAPSULE | Freq: Two times a day (BID) | ORAL | Status: DC
  Administered 2016-06-05 – 2016-06-08 (×8): 75 mg via ORAL
  Filled 2016-06-05 (×8): qty 1

## 2016-06-05 MED ORDER — PANTOPRAZOLE SODIUM 40 MG PO TBEC
40.0000 mg | DELAYED_RELEASE_TABLET | Freq: Two times a day (BID) | ORAL | Status: DC
  Administered 2016-06-05 – 2016-06-08 (×7): 40 mg via ORAL
  Filled 2016-06-05 (×7): qty 1

## 2016-06-05 MED ORDER — MUPIROCIN 2 % EX OINT
1.0000 "application " | TOPICAL_OINTMENT | Freq: Two times a day (BID) | CUTANEOUS | Status: DC
  Administered 2016-06-05 – 2016-06-08 (×7): 1 via NASAL
  Filled 2016-06-05 (×3): qty 22

## 2016-06-05 MED ORDER — ONDANSETRON HCL 4 MG/2ML IJ SOLN
4.0000 mg | Freq: Four times a day (QID) | INTRAMUSCULAR | Status: DC | PRN
  Administered 2016-06-05: 4 mg via INTRAVENOUS
  Filled 2016-06-05: qty 2

## 2016-06-05 MED ORDER — MIRTAZAPINE 15 MG PO TABS
15.0000 mg | ORAL_TABLET | Freq: Every day | ORAL | Status: DC
  Administered 2016-06-05 – 2016-06-07 (×4): 15 mg via ORAL
  Filled 2016-06-05 (×4): qty 1

## 2016-06-05 MED ORDER — ACETAMINOPHEN 325 MG PO TABS
650.0000 mg | ORAL_TABLET | ORAL | Status: DC | PRN
  Administered 2016-06-05 – 2016-06-06 (×2): 650 mg via ORAL
  Filled 2016-06-05 (×3): qty 2

## 2016-06-05 MED ORDER — POTASSIUM CHLORIDE CRYS ER 20 MEQ PO TBCR
20.0000 meq | EXTENDED_RELEASE_TABLET | Freq: Three times a day (TID) | ORAL | Status: DC
  Administered 2016-06-05 (×3): 20 meq via ORAL
  Filled 2016-06-05 (×3): qty 1

## 2016-06-05 MED ORDER — ATORVASTATIN CALCIUM 10 MG PO TABS
10.0000 mg | ORAL_TABLET | Freq: Every day | ORAL | Status: DC
  Administered 2016-06-05 – 2016-06-08 (×4): 10 mg via ORAL
  Filled 2016-06-05 (×4): qty 1

## 2016-06-05 MED ORDER — ACETAMINOPHEN 325 MG PO TABS: 650.0000 mg | ORAL_TABLET | Freq: Three times a day (TID) | ORAL | Status: DC

## 2016-06-05 MED ORDER — SODIUM CHLORIDE 0.9% FLUSH
3.0000 mL | Freq: Two times a day (BID) | INTRAVENOUS | Status: DC
Start: 2016-06-05 — End: 2016-06-08
  Administered 2016-06-05 – 2016-06-07 (×7): 3 mL via INTRAVENOUS

## 2016-06-05 MED ORDER — CIPROFLOXACIN IN D5W 400 MG/200ML IV SOLN
400.0000 mg | Freq: Two times a day (BID) | INTRAVENOUS | Status: DC
  Administered 2016-06-05 – 2016-06-07 (×5): 400 mg via INTRAVENOUS
  Filled 2016-06-05 (×5): qty 200

## 2016-06-05 MED ORDER — ONDANSETRON HCL 4 MG PO TABS: 4.0000 mg | ORAL_TABLET | Freq: Four times a day (QID) | ORAL | Status: DC | PRN

## 2016-06-05 MED ORDER — ENSURE ENLIVE PO LIQD
237.0000 mL | Freq: Two times a day (BID) | ORAL | Status: DC
  Administered 2016-06-05 – 2016-06-07 (×3): 237 mL via ORAL

## 2016-06-05 NOTE — Progress Notes (Signed)
Subjective: He was admitted from skilled nursing yesterday with a fever over 102. He tested positive for influenza A and has too numerous to count white cells in the urine with many bacteria. He has been started on Tamiflu and ciprofloxacin. He had a mildly elevated lactic acid of 2. Renal function is normal. He denies dysuria. He has had a nonproductive cough and just has generally not really felt well for couple of days. He vomited yesterday but has not had any vomiting overnight. No fever now.  Objective: Vital signs in last 24 hours: Vitals:   06/04/16 2330 06/04/16 2345 06/05/16 0000 06/05/16 0130  BP: 120/67  122/68 (!) 110/58  Pulse:  114  (!) 110  Resp: (!) 32 24 20   Temp:    98.8 F (37.1 C)  TempSrc:    Oral  SpO2:  94%  93%  Weight:    274 lb 11.2 oz (124.6 kg)  Height:    6' (1.829 m)   Weight change:   Intake/Output Summary (Last 24 hours) at 06/05/16 0943 Last data filed at 06/05/16 0255  Gross per 24 hour  Intake             3263 ml  Output                0 ml  Net             3263 ml    Physical Exam: Alert. No distress. Pharynx unremarkable. Breathing comfortably. Lungs clear. Heart irregularly irregular at 105 bpm on telemetry. Abdomen is obese and nontender with no hepatosplenomegaly palpable. Lower extremities reveal chronic venous insufficiency changes but no edema.  Lab Results:    Results for orders placed or performed during the hospital encounter of 06/04/16 (from the past 24 hour(s))  CBC with Differential     Status: Abnormal   Collection Time: 06/04/16  7:35 PM  Result Value Ref Range   WBC 5.1 4.0 - 10.5 K/uL   RBC 4.25 4.22 - 5.81 MIL/uL   Hemoglobin 11.8 (L) 13.0 - 17.0 g/dL   HCT 36.0 (L) 39.0 - 52.0 %   MCV 84.7 78.0 - 100.0 fL   MCH 27.8 26.0 - 34.0 pg   MCHC 32.8 30.0 - 36.0 g/dL   RDW 17.7 (H) 11.5 - 15.5 %   Platelets 186 150 - 400 K/uL   Neutrophils Relative % 80 %   Neutro Abs 4.1 1.7 - 7.7 K/uL   Lymphocytes Relative 10 %    Lymphs Abs 0.5 (L) 0.7 - 4.0 K/uL   Monocytes Relative 9 %   Monocytes Absolute 0.4 0.1 - 1.0 K/uL   Eosinophils Relative 1 %   Eosinophils Absolute 0.0 0.0 - 0.7 K/uL   Basophils Relative 1 %   Basophils Absolute 0.0 0.0 - 0.1 K/uL  Comprehensive metabolic panel     Status: Abnormal   Collection Time: 06/04/16  7:35 PM  Result Value Ref Range   Sodium 142 135 - 145 mmol/L   Potassium 3.1 (L) 3.5 - 5.1 mmol/L   Chloride 106 101 - 111 mmol/L   CO2 28 22 - 32 mmol/L   Glucose, Bld 144 (H) 65 - 99 mg/dL   BUN 6 6 - 20 mg/dL   Creatinine, Ser 0.59 (L) 0.61 - 1.24 mg/dL   Calcium 6.7 (L) 8.9 - 10.3 mg/dL   Total Protein 5.4 (L) 6.5 - 8.1 g/dL   Albumin 2.1 (L) 3.5 - 5.0 g/dL   AST 70 (  H) 15 - 41 U/L   ALT 31 17 - 63 U/L   Alkaline Phosphatase 78 38 - 126 U/L   Total Bilirubin 0.6 0.3 - 1.2 mg/dL   GFR calc non Af Amer >60 >60 mL/min   GFR calc Af Amer >60 >60 mL/min   Anion gap 8 5 - 15  Influenza panel by PCR (type A & B)     Status: Abnormal   Collection Time: 06/04/16  7:54 PM  Result Value Ref Range   Influenza A By PCR POSITIVE (A) NEGATIVE   Influenza B By PCR NEGATIVE NEGATIVE  I-Stat CG4 Lactic Acid, ED     Status: Abnormal   Collection Time: 06/04/16  7:58 PM  Result Value Ref Range   Lactic Acid, Venous 2.02 (HH) 0.5 - 1.9 mmol/L  Urinalysis, Routine w reflex microscopic     Status: Abnormal   Collection Time: 06/04/16  8:21 PM  Result Value Ref Range   Color, Urine YELLOW YELLOW   APPearance CLOUDY (A) CLEAR   Specific Gravity, Urine 1.016 1.005 - 1.030   pH 6.0 5.0 - 8.0   Glucose, UA NEGATIVE NEGATIVE mg/dL   Hgb urine dipstick NEGATIVE NEGATIVE   Bilirubin Urine NEGATIVE NEGATIVE   Ketones, ur 5 (A) NEGATIVE mg/dL   Protein, ur 30 (A) NEGATIVE mg/dL   Nitrite NEGATIVE NEGATIVE   Leukocytes, UA LARGE (A) NEGATIVE   RBC / HPF 6-30 0 - 5 RBC/hpf   WBC, UA TOO NUMEROUS TO COUNT 0 - 5 WBC/hpf   Bacteria, UA MANY (A) NONE SEEN   Mucous PRESENT   Blood Culture  (routine x 2)     Status: None (Preliminary result)   Collection Time: 06/04/16  8:42 PM  Result Value Ref Range   Specimen Description LEFT ANTECUBITAL    Special Requests BOTTLES DRAWN AEROBIC AND ANAEROBIC 4CC EACH    Culture PENDING    Report Status PENDING   Blood Culture (routine x 2)     Status: None (Preliminary result)   Collection Time: 06/04/16  8:52 PM  Result Value Ref Range   Specimen Description BLOOD LEFT HAND    Special Requests BOTTLES DRAWN AEROBIC AND ANAEROBIC [facility] EACH    Culture PENDING    Report Status PENDING   MRSA PCR Screening     Status: Abnormal   Collection Time: 06/05/16  2:02 AM  Result Value Ref Range   MRSA by PCR POSITIVE (A) NEGATIVE  Glucose, capillary     Status: Abnormal   Collection Time: 06/05/16  2:33 AM  Result Value Ref Range   Glucose-Capillary 147 (H) 65 - 99 mg/dL  Comprehensive metabolic panel     Status: Abnormal   Collection Time: 06/05/16  6:47 AM  Result Value Ref Range   Sodium 139 135 - 145 mmol/L   Potassium 3.0 (L) 3.5 - 5.1 mmol/L   Chloride 106 101 - 111 mmol/L   CO2 28 22 - 32 mmol/L   Glucose, Bld 139 (H) 65 - 99 mg/dL   BUN 5 (L) 6 - 20 mg/dL   Creatinine, Ser 0.46 (L) 0.61 - 1.24 mg/dL   Calcium 6.0 (LL) 8.9 - 10.3 mg/dL   Total Protein 4.6 (L) 6.5 - 8.1 g/dL   Albumin 1.8 (L) 3.5 - 5.0 g/dL   AST 61 (H) 15 - 41 U/L   ALT 27 17 - 63 U/L   Alkaline Phosphatase 63 38 - 126 U/L   Total Bilirubin 0.6 0.3 - 1.2  mg/dL   GFR calc non Af Amer >60 >60 mL/min   GFR calc Af Amer >60 >60 mL/min   Anion gap 5 5 - 15  CBC     Status: Abnormal   Collection Time: 06/05/16  6:47 AM  Result Value Ref Range   WBC 4.5 4.0 - 10.5 K/uL   RBC 3.87 (L) 4.22 - 5.81 MIL/uL   Hemoglobin 10.7 (L) 13.0 - 17.0 g/dL   HCT 32.9 (L) 39.0 - 52.0 %   MCV 85.0 78.0 - 100.0 fL   MCH 27.6 26.0 - 34.0 pg   MCHC 32.5 30.0 - 36.0 g/dL   RDW 17.7 (H) 11.5 - 15.5 %   Platelets 161 150 - 400 K/uL  TSH     Status: None   Collection Time:  06/05/16  6:47 AM  Result Value Ref Range   TSH 0.865 0.350 - 4.500 uIU/mL  Glucose, capillary     Status: Abnormal   Collection Time: 06/05/16  7:46 AM  Result Value Ref Range   Glucose-Capillary 118 (H) 65 - 99 mg/dL   Comment 1 Notify RN    Comment 2 Document in Chart      ABGS No results for input(s): PHART, PO2ART, TCO2, HCO3 in the last 72 hours.  Invalid input(s): PCO2 CULTURES Recent Results (from the past 240 hour(s))  Blood Culture (routine x 2)     Status: None (Preliminary result)   Collection Time: 06/04/16  8:42 PM  Result Value Ref Range Status   Specimen Description LEFT ANTECUBITAL  Final   Special Requests BOTTLES DRAWN AEROBIC AND ANAEROBIC 4CC EACH  Final   Culture PENDING  Incomplete   Report Status PENDING  Incomplete  Blood Culture (routine x 2)     Status: None (Preliminary result)   Collection Time: 06/04/16  8:52 PM  Result Value Ref Range Status   Specimen Description BLOOD LEFT HAND  Final   Special Requests BOTTLES DRAWN AEROBIC AND ANAEROBIC [facility] EACH  Final   Culture PENDING  Incomplete   Report Status PENDING  Incomplete  MRSA PCR Screening     Status: Abnormal   Collection Time: 06/05/16  2:02 AM  Result Value Ref Range Status   MRSA by PCR POSITIVE (A) NEGATIVE Final    Comment:        The GeneXpert MRSA Assay (FDA approved for NASAL specimens only), is one component of a comprehensive MRSA colonization surveillance program. It is not intended to diagnose MRSA infection nor to guide or monitor treatment for MRSA infections. RESULT CALLED TO, READ BACK BY AND VERIFIED WITH: JOHNSON,B AT [id number] ON 06/05/2016 BY MOSLEY,J    Studies/Results: Dg Chest Port 1 View  Result Date: 06/04/2016 CLINICAL DATA:  Nausea vomiting and fever today. EXAM: PORTABLE CHEST 1 VIEW COMPARISON:  04/06/2016 FINDINGS: Unchanged right hemidiaphragm elevation and possible effusion. Mild airspace opacity adjacent to the right hemidiaphragm, probably atelectatic.  Left lung is clear. Mild interstitial prominence. Unchanged hilar, mediastinal and cardiac contours. IMPRESSION: Mild interstitial prominence. Stable right hemidiaphragm elevation and possible right effusion. Electronically Signed   By: Andreas Newport M.D.   On: 06/04/2016 21:52   Micro Results: Recent Results (from the past 240 hour(s))  Blood Culture (routine x 2)     Status: None (Preliminary result)   Collection Time: 06/04/16  8:42 PM  Result Value Ref Range Status   Specimen Description LEFT ANTECUBITAL  Final   Special Requests BOTTLES DRAWN AEROBIC AND ANAEROBIC [place]  Final  Culture PENDING  Incomplete   Report Status PENDING  Incomplete  Blood Culture (routine x 2)     Status: None (Preliminary result)   Collection Time: 06/04/16  8:52 PM  Result Value Ref Range Status   Specimen Description BLOOD LEFT HAND  Final   Special Requests BOTTLES DRAWN AEROBIC AND ANAEROBIC [facility] EACH  Final   Culture PENDING  Incomplete   Report Status PENDING  Incomplete  MRSA PCR Screening     Status: Abnormal   Collection Time: 06/05/16  2:02 AM  Result Value Ref Range Status   MRSA by PCR POSITIVE (A) NEGATIVE Final    Comment:        The GeneXpert MRSA Assay (FDA approved for NASAL specimens only), is one component of a comprehensive MRSA colonization surveillance program. It is not intended to diagnose MRSA infection nor to guide or monitor treatment for MRSA infections. RESULT CALLED TO, READ BACK BY AND VERIFIED WITH: JOHNSON,B AT [id number] ON 06/05/2016 BY MOSLEY,J    Studies/Results: Dg Chest Port 1 View  Result Date: 06/04/2016 CLINICAL DATA:  Nausea vomiting and fever today. EXAM: PORTABLE CHEST 1 VIEW COMPARISON:  04/06/2016 FINDINGS: Unchanged right hemidiaphragm elevation and possible effusion. Mild airspace opacity adjacent to the right hemidiaphragm, probably atelectatic. Left lung is clear. Mild interstitial prominence. Unchanged hilar, mediastinal and cardiac contours.  IMPRESSION: Mild interstitial prominence. Stable right hemidiaphragm elevation and possible right effusion. Electronically Signed   By: Andreas Newport M.D.   On: 06/04/2016 21:52   Medications:  I have reviewed the patient's current medications Scheduled Meds: . atorvastatin  10 mg Oral Daily  . Chlorhexidine Gluconate Cloth  6 each Topical Q0600  . ciprofloxacin  400 mg Intravenous Q12H  . feeding supplement (ENSURE ENLIVE)  237 mL Oral BID BM  . heparin  5,000 Units Subcutaneous Q8H  . insulin aspart  0-15 Units Subcutaneous TID WC  . insulin aspart  0-5 Units Subcutaneous QHS  . mouth rinse  15 mL Mouth Rinse BID  . mirtazapine  15 mg Oral QHS  . mupirocin ointment  1 application Nasal BID  . oseltamivir  75 mg Oral BID  . pantoprazole  40 mg Oral BID AC  . potassium chloride  20 mEq Oral TID  . pregabalin  150 mg Oral BID  . sodium chloride flush  3 mL Intravenous Q12H   Continuous Infusions: PRN Meds:.acetaminophen, ondansetron **OR** ondansetron (ZOFRAN) IV   Assessment/Plan: #1. Influenza A. Continue Tamiflu. Repeat lactic acid. #2. UTI. Blood and urine cultures are pending. Continue ciprofloxacin. #3. Diabetes. Glucose is 118 this morning. #4. Hypokalemia. Potassium is 3.0. Supplement orally. #5. Chronic atrial fibrillation. Stable. #6. History of empyema. No recurrence on chest x-ray. White count 4.5. #7. Protein calorie malnutrition. Albumin was 2.1 on admission and is 1.8 today. Continue nutritional supplementation. #8. Normocytic anemia. With hydration hemoglobin has declined from 11.8-10.7. He had a duodenal ulcer previously and has continued pantoprazole with no signs of active bleeding. Active Problems:   Hypertension   Morbid obesity ([place])   Type II diabetes mellitus, uncontrolled ([place])   Atrial fibrillation ([place])   UTI (urinary tract infection)   Influenza A     LOS: 1 day   Lekeith Wulf 06/05/2016, 9:43 AM

## 2016-06-05 NOTE — Progress Notes (Signed)
Dr. Marin Comment notified of SVT per Tele, HR 165. Patient sleeping. Rhythm back to A-fib baseline.

## 2016-06-06 LAB — LACTIC ACID, PLASMA: Lactic Acid, Venous: 0.9 mmol/L (ref 0.5–1.9)

## 2016-06-06 LAB — BASIC METABOLIC PANEL
Anion gap: 4 — ABNORMAL LOW (ref 5–15)
BUN: 5 mg/dL — AB (ref 6–20)
CALCIUM: 6 mg/dL — AB (ref 8.9–10.3)
CO2: 28 mmol/L (ref 22–32)
CREATININE: 0.48 mg/dL — AB (ref 0.61–1.24)
Chloride: 104 mmol/L (ref 101–111)
GFR calc Af Amer: >60 mL/min (ref >60)
GFR calc non Af Amer: >60 mL/min (ref >60)
GLUCOSE: 121 mg/dL — AB (ref 65–99)
Potassium: 2.9 mmol/L — ABNORMAL LOW (ref 3.5–5.1)
Sodium: 136 mmol/L (ref 135–145)

## 2016-06-06 LAB — GLUCOSE, CAPILLARY
Glucose-Capillary: 99 mg/dL (ref 65–99)
Glucose-Capillary: 158 mg/dL — ABNORMAL HIGH (ref 65–99)
Glucose-Capillary: 108 mg/dL — ABNORMAL HIGH (ref 65–99)
Glucose-Capillary: 115 mg/dL — ABNORMAL HIGH (ref 65–99)

## 2016-06-06 MED ORDER — POTASSIUM CHLORIDE CRYS ER 20 MEQ PO TBCR
40.0000 meq | EXTENDED_RELEASE_TABLET | Freq: Three times a day (TID) | ORAL | Status: AC
  Administered 2016-06-06 (×3): 40 meq via ORAL
  Filled 2016-06-06 (×3): qty 2

## 2016-06-06 MED ORDER — CALCIUM CARBONATE-VITAMIN D 500-200 MG-UNIT PO TABS
1.0000 | ORAL_TABLET | Freq: Two times a day (BID) | ORAL | Status: DC
  Administered 2016-06-06 – 2016-06-08 (×5): 1 via ORAL
  Filled 2016-06-06 (×5): qty 1

## 2016-06-06 MED ORDER — PRO-STAT SUGAR FREE PO LIQD
30.0000 mL | Freq: Two times a day (BID) | ORAL | Status: DC
  Administered 2016-06-06 – 2016-06-08 (×4): 30 mL via ORAL
  Filled 2016-06-06 (×4): qty 30

## 2016-06-06 NOTE — Progress Notes (Signed)
Subjective: Cough is unchanged. He denies any further vomiting. Maximum temperature has been 99.4.  Objective: Vital signs in last 24 hours: Vitals:   06/05/16 0130 06/05/16 1317 06/05/16 2135 06/06/16 0630  BP: (!) 110/58 115/84 126/69 137/77  Pulse: (!) 110 (!) 102 (!) 105 (!) 106  Resp:  18 20 20   Temp: 98.8 F (37.1 C) 98.5 F (36.9 C) 99.4 F (37.4 C) 98.1 F (36.7 C)  TempSrc: Oral Oral Oral Oral  SpO2: 93% 96% 91% 90%  Weight: 274 lb 11.2 oz (124.6 kg)     Height: 6' (1.829 m)      Weight change:   Intake/Output Summary (Last 24 hours) at 06/06/16 0740 Last data filed at 06/06/16 0630  Gross per 24 hour  Intake              170 ml  Output              400 ml  Net             -230 ml    Physical Exam: Alert. No distress. Lungs clear. Heart irregularly irregular. Abdomen soft and nontender. Neuro stable.  Lab Results:    Results for orders placed or performed during the hospital encounter of 06/04/16 (from the past 24 hour(s))  Glucose, capillary     Status: Abnormal   Collection Time: 06/05/16  7:46 AM  Result Value Ref Range   Glucose-Capillary 118 (H) 65 - 99 mg/dL   Comment 1 Notify RN    Comment 2 Document in Chart   Glucose, capillary     Status: Abnormal   Collection Time: 06/05/16 11:08 AM  Result Value Ref Range   Glucose-Capillary 144 (H) 65 - 99 mg/dL   Comment 1 Notify RN    Comment 2 Document in Chart   Glucose, capillary     Status: Abnormal   Collection Time: 06/05/16  4:21 PM  Result Value Ref Range   Glucose-Capillary 137 (H) 65 - 99 mg/dL  Glucose, capillary     Status: Abnormal   Collection Time: 06/05/16  7:20 PM  Result Value Ref Range   Glucose-Capillary 124 (H) 65 - 99 mg/dL  Basic metabolic panel     Status: Abnormal   Collection Time: 06/06/16  5:03 AM  Result Value Ref Range   Sodium 136 135 - 145 mmol/L   Potassium 2.9 (L) 3.5 - 5.1 mmol/L   Chloride 104 101 - 111 mmol/L   CO2 28 22 - 32 mmol/L   Glucose, Bld 121 (H) 65 -  99 mg/dL   BUN 5 (L) 6 - 20 mg/dL   Creatinine, Ser 0.48 (L) 0.61 - 1.24 mg/dL   Calcium 6.0 (LL) 8.9 - 10.3 mg/dL   GFR calc non Af Amer >60 >60 mL/min   GFR calc Af Amer >60 >60 mL/min   Anion gap 4 (L) 5 - 15  Lactic acid, plasma     Status: None   Collection Time: 06/06/16  5:06 AM  Result Value Ref Range   Lactic Acid, Venous 0.9 0.5 - 1.9 mmol/L     ABGS No results for input(s): PHART, PO2ART, TCO2, HCO3 in the last 72 hours.  Invalid input(s): PCO2 CULTURES Recent Results (from the past 240 hour(s))  Blood Culture (routine x 2)     Status: None (Preliminary result)   Collection Time: 06/04/16  8:42 PM  Result Value Ref Range Status   Specimen Description LEFT ANTECUBITAL  Final   Special  Requests BOTTLES DRAWN AEROBIC AND ANAEROBIC 4CC EACH  Final   Culture NO GROWTH < 24 HOURS  Final   Report Status PENDING  Incomplete  Blood Culture (routine x 2)     Status: None (Preliminary result)   Collection Time: 06/04/16  8:52 PM  Result Value Ref Range Status   Specimen Description BLOOD LEFT HAND  Final   Special Requests BOTTLES DRAWN AEROBIC AND ANAEROBIC 6CC EACH  Final   Culture NO GROWTH < 24 HOURS  Final   Report Status PENDING  Incomplete  MRSA PCR Screening     Status: Abnormal   Collection Time: 06/05/16  2:02 AM  Result Value Ref Range Status   MRSA by PCR POSITIVE (A) NEGATIVE Final    Comment:        The GeneXpert MRSA Assay (FDA approved for NASAL specimens only), is one component of a comprehensive MRSA colonization surveillance program. It is not intended to diagnose MRSA infection nor to guide or monitor treatment for MRSA infections. RESULT CALLED TO, READ BACK BY AND VERIFIED WITH: JOHNSON,B AT [id number] ON 06/05/2016 BY MOSLEY,J    Studies/Results: Dg Chest Port 1 View  Result Date: 06/04/2016 CLINICAL DATA:  Nausea vomiting and fever today. EXAM: PORTABLE CHEST 1 VIEW COMPARISON:  04/06/2016 FINDINGS: Unchanged right hemidiaphragm elevation and  possible effusion. Mild airspace opacity adjacent to the right hemidiaphragm, probably atelectatic. Left lung is clear. Mild interstitial prominence. Unchanged hilar, mediastinal and cardiac contours. IMPRESSION: Mild interstitial prominence. Stable right hemidiaphragm elevation and possible right effusion. Electronically Signed   By: Andreas Newport M.D.   On: 06/04/2016 21:52   Micro Results: Recent Results (from the past 240 hour(s))  Blood Culture (routine x 2)     Status: None (Preliminary result)   Collection Time: 06/04/16  8:42 PM  Result Value Ref Range Status   Specimen Description LEFT ANTECUBITAL  Final   Special Requests BOTTLES DRAWN AEROBIC AND ANAEROBIC 4CC EACH  Final   Culture NO GROWTH < 24 HOURS  Final   Report Status PENDING  Incomplete  Blood Culture (routine x 2)     Status: None (Preliminary result)   Collection Time: 06/04/16  8:52 PM  Result Value Ref Range Status   Specimen Description BLOOD LEFT HAND  Final   Special Requests BOTTLES DRAWN AEROBIC AND ANAEROBIC 6CC EACH  Final   Culture NO GROWTH < 24 HOURS  Final   Report Status PENDING  Incomplete  MRSA PCR Screening     Status: Abnormal   Collection Time: 06/05/16  2:02 AM  Result Value Ref Range Status   MRSA by PCR POSITIVE (A) NEGATIVE Final    Comment:        The GeneXpert MRSA Assay (FDA approved for NASAL specimens only), is one component of a comprehensive MRSA colonization surveillance program. It is not intended to diagnose MRSA infection nor to guide or monitor treatment for MRSA infections. RESULT CALLED TO, READ BACK BY AND VERIFIED WITH: JOHNSON,B AT [id number] ON 06/05/2016 BY MOSLEY,J    Studies/Results: Dg Chest Port 1 View  Result Date: 06/04/2016 CLINICAL DATA:  Nausea vomiting and fever today. EXAM: PORTABLE CHEST 1 VIEW COMPARISON:  04/06/2016 FINDINGS: Unchanged right hemidiaphragm elevation and possible effusion. Mild airspace opacity adjacent to the right hemidiaphragm, probably  atelectatic. Left lung is clear. Mild interstitial prominence. Unchanged hilar, mediastinal and cardiac contours. IMPRESSION: Mild interstitial prominence. Stable right hemidiaphragm elevation and possible right effusion. Electronically Signed   By: Andreas Newport  M.D.   On: 06/04/2016 21:52   Medications:  I have reviewed the patient's current medications Scheduled Meds: . atorvastatin  10 mg Oral Daily  . calcium-vitamin D  1 tablet Oral BID  . Chlorhexidine Gluconate Cloth  6 each Topical Q0600  . ciprofloxacin  400 mg Intravenous Q12H  . feeding supplement (ENSURE ENLIVE)  237 mL Oral BID BM  . heparin  5,000 Units Subcutaneous Q8H  . insulin aspart  0-15 Units Subcutaneous TID WC  . insulin aspart  0-5 Units Subcutaneous QHS  . mouth rinse  15 mL Mouth Rinse BID  . mirtazapine  15 mg Oral QHS  . mupirocin ointment  1 application Nasal BID  . oseltamivir  75 mg Oral BID  . pantoprazole  40 mg Oral BID AC  . potassium chloride  40 mEq Oral TID  . pregabalin  150 mg Oral BID  . sodium chloride flush  3 mL Intravenous Q12H   Continuous Infusions: PRN Meds:.acetaminophen, ondansetron **OR** ondansetron (ZOFRAN) IV   Assessment/Plan: #1. Influenza A. Continue Tamiflu. #2. UTI. Culture pending. Continue Cipro. #3. Diabetes. Continue sliding scale NovoLog. Fasting glucose 121. #4. Hypokalemia. Potassium 2.9. Increase potassium supplements. #5. Hypocalcemia. Protein calorie malnutrition. Add calcium and vitamin D. Continue nutritional supplements twice daily. #6. Elevated lactic acid. Now normal at 0.9. Active Problems:   Hypertension   Morbid obesity ([place])   Type II diabetes mellitus, uncontrolled ([place])   Atrial fibrillation ([place])   UTI (urinary tract infection)   Influenza A     LOS: 2 days   Jakki Doughty 06/06/2016, 7:40 AM

## 2016-06-06 NOTE — Clinical Social Work Note (Signed)
Clinical Social Work Assessment  Patient Details  Name: Richard Fields MRN: [id number] Date of Birth: 1941/02/25  Date of referral:  06/06/16               Reason for consult:  Discharge Planning                Permission sought to share information with:  Facility Art therapist granted to share information::  Yes, Verbal Permission Granted  Name::        Agency::  [facility]  Relationship::  facility  Contact Information:     Housing/Transportation Living arrangements for the past 2 months:  [place] of Information:  Spouse, Facility Patient Interpreter Needed:  None Criminal Activity/Legal Involvement Pertinent to Current Situation/Hospitalization:  No - Comment as needed Significant Relationships:  Spouse Lives with:  Facility Resident Do you feel safe going back to the place where you live?  Yes Need for family participation in patient care:  Yes (Comment)  Care giving concerns:  None reported. Pt is resident at [facility].    Social Worker assessment / plan:  CSW spoke with pt's wife on phone as pt is disoriented per Therapist, sports. Pt's wife reports pt has been a resident at [facility] for several months. She tries to see pt daily. Pt's wife is hopeful to still take pt home, but appears to recognize that this may not be possible. She requests return to [facility] when medically stable. CSW spoke with Debbie at facility who reports pt is skilled level of care and okay to return. Pt has just a few skilled days left. Aware flu positive.   Employment status:  Retired Nurse, adult PT Recommendations:  Not assessed at this time Information / Referral to community resources:  Other (Comment Required) (Return to [facility])  Patient/Family's Response to care:  Pt's wife requests return to [facility] when medically stable.   Patient/Family's Understanding of and Emotional Response to Diagnosis, Current Treatment, and Prognosis:  Pt's wife plans to  call RN for update on pt this morning.   Emotional Assessment Appearance:  Appears stated age Attitude/Demeanor/Rapport:  Unable to Assess Affect (typically observed):  Unable to Assess Orientation:    Alcohol / Substance use:  Not Applicable Psych involvement (Current and /or in the community):  No (Comment)  Discharge Needs  Concerns to be addressed:  Discharge Planning Concerns Readmission within the last 30 days:  No Current discharge risk:  None Barriers to Discharge:  Continued Medical Work up   Salome Arnt, [place] 06/06/2016, 10:17 AM [phone]

## 2016-06-06 NOTE — Progress Notes (Signed)
CRITICAL VALUE ALERT  Critical value received:  Calcium=6.0  Date of notification:  06/06/16  Time of notification:  0555  Critical value read back:Yes.    Nurse who received alert:  Manuela Schwartz, RN  MD notified (1st page):  Dr. Willey Blade  Time of first page:    MD notified (2nd page):  Time of second page:  Responding MD:  Dr. Willey Blade  Time MD responded:  Dr. Willey Blade notified via phone of value.  No new orders given at this time.

## 2016-06-06 NOTE — Progress Notes (Signed)
Initial Nutrition Assessment  DOCUMENTATION CODES:  Obesity Class II    INTERVENTION:  Ensure Enlive po BID, each supplement provides 350 kcal and 20 grams of protein   ProStat 30 ml BID (each 30 ml provides 100 kcal, 15 gr protein)   Heart Healthy/ CHO modified diet   High Protein hs snack daily   NUTRITION DIAGNOSIS:   Inadequate oral intake related to acute illness as evidenced by meal completion < 50%.  GOAL:   Patient will meet greater than or equal to 90% of their needs   MONITOR:  Po intake (meals and supplements), labs and wt trends     REASON FOR ASSESSMENT:   Malnutrition Screening Tool    ASSESSMENT: Patient presents from [facility] with the flu and a UTI.  His appetite is poor currently 1-50% of meals. Feeds himself but says he just doesn't feel like eating much of anything. RD emphasized the importance of eating as part of his medical care to facilitate healing. He is agreeable to drink ensure while he is not feeling well. He will also need a protein modular since his intake is so far below his goal.    Nutrition-Focused physical exam completed. Findings are no fat depletion, mild muscle depletion (clavicles and acromion region), and mild lower extremity edema. He doesn't meet criteria for malnutrition at this time but is high risk given his poor intake. RD will continue to follow and re-assess as appropriate.    Recent Labs Lab 06/04/16 1935 06/05/16 0647 06/06/16 0503  NA 142 139 136  K 3.1* 3.0* 2.9*  CL 106 106 104  CO2 28 28 28   BUN 6 5* 5*  CREATININE 0.59* 0.46* 0.48*  CALCIUM 6.7* 6.0* 6.0*  GLUCOSE 144* 139* 121*   Labs: potassium 2.9   Diet Order:  Diet heart healthy/carb modified Room service appropriate? Yes; Fluid consistency: Thin  Skin:   intact- lower extremity edema   Last BM:  2/3    Height:   Ht Readings from Last 1 Encounters:  06/05/16 6' (1.829 m)    Weight:   Wt Readings from Last 1 Encounters:  06/05/16 274 lb 11.2  oz (124.6 kg)    Ideal Body Weight:  81 kg  BMI:  Body mass index is 37.26 kg/m.  Estimated Nutritional Needs:   Kcal:  2014-2215  Protein:  140-160 gr  Fluid:  2.0-2.2 liters daily  EDUCATION NEEDS:   Education needs addressed  Colman Cater MS,RD,CSG,LDN Office: [phone] Pager: [phone]

## 2016-06-06 NOTE — Care Management Note (Signed)
Case Management Note  Patient Details  Name: Richard Fields MRN: [id number] Date of Birth: [birth year]  Subjective/Objective:                  Pt admitted with flu. He is from [facility]. CSW aware and will make arrangements for return to facility at [place].   Action/Plan: No CM needs anticipated.   Expected Discharge Date:     06/08/2016             Expected Discharge Plan:  [place]  In-House Referral:  Clinical Social Work  Discharge planning Services  CM Consult  Post Acute Care Choice:  NA Choice offered to:  NA  Status of Service:  Completed, signed off  Sherald Barge, RN 06/06/2016, 9:10 AM

## 2016-06-06 NOTE — NC FL2 (Signed)
[place] LEVEL OF CARE SCREENING TOOL     IDENTIFICATION  Patient Name: Richard Fields Birthdate: 1940/06/14 Sex: male Admission Date (Current Location): 06/04/2016  [facility] and [place] Number:  [employer] and Address:  [facility] [address] [place]      Provider Number: [id number]  Attending Physician Name and Address:  Asencion Noble, MD  Relative Name and Phone Number:       Current Level of Care: Hospital Recommended Level of Care: [place] Prior Approval Number:    Date Approved/Denied:   PASRR Number: [id number]  Discharge Plan: SNF    Current Diagnoses: Patient Active Problem List   Diagnosis Date Noted  . UTI (urinary tract infection) 06/04/2016  . Influenza A 06/04/2016  . Duodenal ulcer with hemorrhage 04/04/2016  . Uncontrolled type 2 diabetes mellitus with complication ([place])   . Atrial fibrillation ([place])   . Acute pain of right shoulder   . Empyema ([place]) 03/07/2016  . Abdominal pain, right lateral   . Aspirin long-term use   . Hypoxia   . Pleural effusion on right   . S/P thoracentesis   . SOB (shortness of breath)   . Acute upper GI bleeding 02/28/2016  . Acute blood loss anemia 02/28/2016  . Protein-calorie malnutrition ([place]) 02/28/2016  . Acute respiratory failure with hypoxia ([place]) 02/26/2016  . Encephalopathy, metabolic [id number]  . Type II diabetes mellitus, uncontrolled ([place]) 02/24/2016  . Sepsis ([place]) 02/24/2016  . Elevated troponin level 02/24/2016  . Pressure injury of skin 02/19/2016  . Cavitary pneumonia 02/18/2016  . Hemoptysis 02/18/2016  . Morbid obesity ([place]) 02/18/2016  . 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 Hypertension 12/02/2015  . Screening 05/21/2012    Orientation RESPIRATION BLADDER Height & Weight     Self, Situation, Place  Normal Incontinent Weight: 274 lb 11.2 oz (124.6 kg) Height:  6' (182.9 cm)  BEHAVIORAL SYMPTOMS/MOOD NEUROLOGICAL  BOWEL NUTRITION STATUS  Other (Comment) (none)  (n/a) Incontinent Diet (Heart healthy/carb modified)  AMBULATORY STATUS COMMUNICATION OF NEEDS Skin   Extensive Assist Verbally Skin abrasions, Bruising, Other (Comment) (Moisture associated skin damage to perineum/scrotum. Fissure to coccyx with foam dressing. )                       Personal Care Assistance Level of Assistance  Bathing, Feeding Bathing Assistance: Maximum assistance Feeding assistance: Limited assistance Dressing Assistance: Maximum assistance     Functional Limitations Info  Hearing, Speech, Sight Sight Info: Adequate Hearing Info: Adequate Speech Info: Adequate    SPECIAL CARE FACTORS FREQUENCY                       Contractures      Additional Factors Info  Isolation Precautions Code Status Info: Full code Allergies Info: Penicillins, Tegreto (Carbamazepine), Cefepime     Isolation Precautions Info: MRSA by pcr + 06/05/2016. Droplet precautions.      Current Medications (06/06/2016):  This is the current hospital active medication list Current Facility-Administered Medications  Medication Dose Route Frequency Provider Last Rate Last Dose  . acetaminophen (TYLENOL) tablet 650 mg  650 mg Oral Q4H PRN Orvan Falconer, MD   650 mg at 06/06/16 0330  . atorvastatin (LIPITOR) tablet 10 mg  10 mg Oral Daily Orvan Falconer, MD   10 mg at 06/06/16 [id number]  . calcium-vitamin D (OSCAL WITH D) 500-200 MG-UNIT per tablet 1 tablet  1 tablet Oral  BID Asencion Noble, MD   1 tablet at 06/06/16 [phone]  . Chlorhexidine Gluconate Cloth 2 % PADS 6 each  6 each Topical Q0600 Orvan Falconer, MD   6 each at 06/06/16 0600  . ciprofloxacin (CIPRO) IVPB 400 mg  400 mg Intravenous Q12H Orvan Falconer, MD   400 mg at 06/06/16 [id number]  . feeding supplement (ENSURE ENLIVE) (ENSURE ENLIVE) liquid 237 mL  237 mL Oral BID BM Rosita Fire, MD   237 mL at 06/05/16 1544  . heparin injection 5,000 Units  5,000 Units Subcutaneous Q8H Orvan Falconer, MD   5,000 Units at 06/06/16  0501  . insulin aspart (novoLOG) injection 0-15 Units  0-15 Units Subcutaneous TID WC Orvan Falconer, MD      . insulin aspart (novoLOG) injection 0-5 Units  0-5 Units Subcutaneous QHS Orvan Falconer, MD      . MEDLINE mouth rinse  15 mL Mouth Rinse BID Rosita Fire, MD   15 mL at 06/05/16 2200  . mirtazapine (REMERON) tablet 15 mg  15 mg Oral QHS Orvan Falconer, MD   15 mg at 06/05/16 2122  . mupirocin ointment (BACTROBAN) 2 % 1 application  1 application Nasal BID Orvan Falconer, MD   1 application at [id number] [phone]  . ondansetron (ZOFRAN) tablet 4 mg  4 mg Oral Q6H PRN Orvan Falconer, MD       Or  . ondansetron [facility]) injection 4 mg  4 mg Intravenous Q6H PRN Orvan Falconer, MD   4 mg at 06/05/16 0025  . oseltamivir (TAMIFLU) capsule 75 mg  75 mg Oral BID Orvan Falconer, MD   75 mg at 06/06/16 [id number]  . pantoprazole (PROTONIX) EC tablet 40 mg  40 mg Oral BID AC Orvan Falconer, MD   40 mg at 06/06/16 [id number]  . potassium chloride SA (K-DUR,KLOR-CON) CR tablet 40 mEq  40 mEq Oral TID Asencion Noble, MD   40 mEq at 06/06/16 [id number]  . pregabalin (LYRICA) capsule 150 mg  150 mg Oral BID Orvan Falconer, MD   150 mg at 06/06/16 [id number]  . sodium chloride flush (NS) 0.9 % injection 3 mL  3 mL Intravenous Q12H Orvan Falconer, MD   3 mL at 06/06/16 [id number]     Discharge Medications: Please see discharge summary for a list of discharge medications.  Relevant Imaging Results:  Relevant Lab Results:   Additional Information    Salome Arnt, [place]

## 2016-06-07 LAB — URINE CULTURE

## 2016-06-07 LAB — GLUCOSE, CAPILLARY
GLUCOSE-CAPILLARY: 147 mg/dL — AB (ref 65–99)
Glucose-Capillary: 96 mg/dL (ref 65–99)
Glucose-Capillary: 167 mg/dL — ABNORMAL HIGH (ref 65–99)
Glucose-Capillary: 115 mg/dL — ABNORMAL HIGH (ref 65–99)

## 2016-06-07 MED ORDER — SODIUM CHLORIDE 0.9 % IV SOLN
1.0000 g | Freq: Three times a day (TID) | INTRAVENOUS | Status: DC
  Administered 2016-06-07 – 2016-06-08 (×3): 1 g via INTRAVENOUS
  Filled 2016-06-07 (×4): qty 1

## 2016-06-07 MED ORDER — POTASSIUM CHLORIDE CRYS ER 20 MEQ PO TBCR
20.0000 meq | EXTENDED_RELEASE_TABLET | Freq: Three times a day (TID) | ORAL | Status: DC
  Administered 2016-06-07 – 2016-06-08 (×4): 20 meq via ORAL
  Filled 2016-06-07 (×4): qty 1

## 2016-06-07 MED ORDER — TRAZODONE HCL 50 MG PO TABS
50.0000 mg | ORAL_TABLET | Freq: Every evening | ORAL | Status: DC | PRN
  Administered 2016-06-07: 50 mg via ORAL
  Filled 2016-06-07: qty 1

## 2016-06-07 NOTE — Progress Notes (Signed)
Pharmacy Antibiotic Note  Richard Fields is a 76 y.o. male admitted on 06/04/2016 with ESBL UTI.Marland Kitchen  Pharmacy has been consulted for MEROPENEM dosing.  Plan: Meropenem 1gm IV q8h Monitor labs, progress, c/s  Height: 6' (182.9 cm) Weight: 274 lb 11.2 oz (124.6 kg) IBW/kg (Calculated) : 77.6  Temp (24hrs), Avg:98.5 F (36.9 C), Min:98.2 F (36.8 C), Max:98.8 F (37.1 C)   Recent Labs Lab 06/04/16 1935 06/04/16 1958 06/05/16 0647 06/06/16 0503 06/06/16 0506  WBC 5.1  --  4.5  --   --   CREATININE 0.59*  --  0.46* 0.48*  --   LATICACIDVEN  --  2.02*  --   --  0.9    Estimated Creatinine Clearance: 105.4 mL/min (by C-G formula based on SCr of 0.48 mg/dL (L)).    Allergies  Allergen Reactions  . Penicillins Other (See Comments)    Unknown-reaction is unknown  . Tegretol [Carbamazepine] Itching  . Cefepime Rash   Antimicrobials this admission: Cipro 2/4 >> 2/6 Meropenem 2/6 >>   Dose adjustments this admission:  Microbiology results: 2/5 BCx: pending  UCx:    MRSA PCR: POSITIVE  Specimen Description URINE, CLEAN CATCH   Special Requests NONE   Culture (A)  >=100,000 COLONIES/mL ESCHERICHIA COLI  Confirmed Extended Spectrum Beta-Lactamase Producer (ESBL)  Performed at [facility], [place] [address] [place], [place] [address]    Report Status 06/07/2016 FINAL   Organism ID, Bacteria ESCHERICHIA COLI   Resulting Agency [facility]   Susceptibility    Escherichia coli    MIC    AMPICILLIN =32 RESISTANT ">=32 RESIST... Resistant    AMPICILLIN/SULBACTAM =32 RESISTANT ">=32 RESIST... Resistant    CEFAZOLIN =64 RESISTANT ">=64 RESIST... Resistant    CEFTRIAXONE =64 RESISTANT ">=64 RESIST... Resistant    CIPROFLOXACIN =4 RESISTANT ">=4 RESISTANT  Resistant    Extended ESBL POSITIVE  Resistant    GENTAMICIN <=1 SENSITIVE  Sensitive    IMIPENEM <=0.25 SENS... Sensitive    NITROFURANTOIN 128 RESISTANT  Resistant    PIP/TAZO 8 SENSITIVE  Sensitive    TRIMETH/SULFA =320 RESISTANT ">=320 RESIS... Resistant         Susceptibility Comments    Escherichia coli   >=100,000 COLONIES/mL ESCHERICHIA COLI   Specimen Collected: 06/04/16 20:21 Last Resulted: 06/07/16 10:10   Thank you for allowing pharmacy to be a part of this patient's care.  Hart Robinsons A 06/07/2016 10:39 AM

## 2016-06-07 NOTE — Progress Notes (Signed)
Subjective: Richard Fields complains of not being able to sleep last night. He denies headache, body aches or fever. He has been afebrile. He has difficulty with urinary continence.  Objective: Vital signs in last 24 hours: Vitals:   06/06/16 0630 06/06/16 1408 06/06/16 2055 06/07/16 0601  BP: 137/77 99/65 121/78 135/77  Pulse: (!) 106 (!) 109 (!) 104 (!) 109  Resp: 20 20 (!) 21 20  Temp: 98.1 F (36.7 C) 98.8 F (37.1 C) 98.4 F (36.9 C) 98.2 F (36.8 C)  TempSrc: Oral Oral Oral Oral  SpO2: 90% 96% 94% 94%  Weight:      Height:       Weight change:   Intake/Output Summary (Last 24 hours) at 06/07/16 0715 Last data filed at 06/06/16 1828  Gross per 24 hour  Intake              680 ml  Output              600 ml  Net               80 ml    Physical Exam: Alert. No distress. Lungs clear. Heart irregularly irregular. Abdomen soft and nontender. No lower extremity edema.  Lab Results:    Results for orders placed or performed during the hospital encounter of 06/04/16 (from the past 24 hour(s))  Glucose, capillary     Status: None   Collection Time: 06/06/16  7:47 AM  Result Value Ref Range   Glucose-Capillary 99 65 - 99 mg/dL  Glucose, capillary     Status: Abnormal   Collection Time: 06/06/16 11:02 AM  Result Value Ref Range   Glucose-Capillary 158 (H) 65 - 99 mg/dL  Glucose, capillary     Status: Abnormal   Collection Time: 06/06/16  4:58 PM  Result Value Ref Range   Glucose-Capillary 108 (H) 65 - 99 mg/dL   Comment 1 Notify RN   Glucose, capillary     Status: Abnormal   Collection Time: 06/06/16  8:55 PM  Result Value Ref Range   Glucose-Capillary 115 (H) 65 - 99 mg/dL     ABGS No results for input(s): PHART, PO2ART, TCO2, HCO3 in the last 72 hours.  Invalid input(s): PCO2 CULTURES Recent Results (from the past 240 hour(s))  Urine culture     Status: Abnormal (Preliminary result)   Collection Time: 06/04/16  8:21 PM  Result Value Ref Range Status   Specimen  Description URINE, CLEAN CATCH  Final   Special Requests NONE  Final   Culture >=100,000 COLONIES/mL ESCHERICHIA COLI (A)  Final   Report Status PENDING  Incomplete  Blood Culture (routine x 2)     Status: None (Preliminary result)   Collection Time: 06/04/16  8:42 PM  Result Value Ref Range Status   Specimen Description LEFT ANTECUBITAL  Final   Special Requests BOTTLES DRAWN AEROBIC AND ANAEROBIC 4CC EACH  Final   Culture NO GROWTH 2 DAYS  Final   Report Status PENDING  Incomplete  Blood Culture (routine x 2)     Status: None (Preliminary result)   Collection Time: 06/04/16  8:52 PM  Result Value Ref Range Status   Specimen Description BLOOD LEFT HAND  Final   Special Requests BOTTLES DRAWN AEROBIC AND ANAEROBIC 6CC EACH  Final   Culture NO GROWTH 2 DAYS  Final   Report Status PENDING  Incomplete  MRSA PCR Screening     Status: Abnormal   Collection Time: 06/05/16  2:02 AM  Result Value Ref Range Status   MRSA by PCR POSITIVE (A) NEGATIVE Final    Comment:        The GeneXpert MRSA Assay (FDA approved for NASAL specimens only), is one component of a comprehensive MRSA colonization surveillance program. It is not intended to diagnose MRSA infection nor to guide or monitor treatment for MRSA infections. RESULT CALLED TO, READ BACK BY AND VERIFIED WITH: JOHNSON,B AT [id number] ON 06/05/2016 BY MOSLEY,J    Studies/Results: No results found. Micro Results: Recent Results (from the past 240 hour(s))  Urine culture     Status: Abnormal (Preliminary result)   Collection Time: 06/04/16  8:21 PM  Result Value Ref Range Status   Specimen Description URINE, CLEAN CATCH  Final   Special Requests NONE  Final   Culture >=100,000 COLONIES/mL ESCHERICHIA COLI (A)  Final   Report Status PENDING  Incomplete  Blood Culture (routine x 2)     Status: None (Preliminary result)   Collection Time: 06/04/16  8:42 PM  Result Value Ref Range Status   Specimen Description LEFT ANTECUBITAL  Final    Special Requests BOTTLES DRAWN AEROBIC AND ANAEROBIC 4CC EACH  Final   Culture NO GROWTH 2 DAYS  Final   Report Status PENDING  Incomplete  Blood Culture (routine x 2)     Status: None (Preliminary result)   Collection Time: 06/04/16  8:52 PM  Result Value Ref Range Status   Specimen Description BLOOD LEFT HAND  Final   Special Requests BOTTLES DRAWN AEROBIC AND ANAEROBIC 6CC EACH  Final   Culture NO GROWTH 2 DAYS  Final   Report Status PENDING  Incomplete  MRSA PCR Screening     Status: Abnormal   Collection Time: 06/05/16  2:02 AM  Result Value Ref Range Status   MRSA by PCR POSITIVE (A) NEGATIVE Final    Comment:        The GeneXpert MRSA Assay (FDA approved for NASAL specimens only), is one component of a comprehensive MRSA colonization surveillance program. It is not intended to diagnose MRSA infection nor to guide or monitor treatment for MRSA infections. RESULT CALLED TO, READ BACK BY AND VERIFIED WITH: JOHNSON,B AT [id number] ON 06/05/2016 BY MOSLEY,J    Studies/Results: No results found. Medications:  I have reviewed the patient's current medications Scheduled Meds: . atorvastatin  10 mg Oral Daily  . calcium-vitamin D  1 tablet Oral BID  . Chlorhexidine Gluconate Cloth  6 each Topical Q0600  . ciprofloxacin  400 mg Intravenous Q12H  . feeding supplement (ENSURE ENLIVE)  237 mL Oral BID BM  . feeding supplement (PRO-STAT SUGAR FREE 64)  30 mL Oral BID  . heparin  5,000 Units Subcutaneous Q8H  . insulin aspart  0-15 Units Subcutaneous TID WC  . insulin aspart  0-5 Units Subcutaneous QHS  . mouth rinse  15 mL Mouth Rinse BID  . mirtazapine  15 mg Oral QHS  . mupirocin ointment  1 application Nasal BID  . oseltamivir  75 mg Oral BID  . pantoprazole  40 mg Oral BID AC  . potassium chloride  20 mEq Oral TID  . pregabalin  150 mg Oral BID  . sodium chloride flush  3 mL Intravenous Q12H   Continuous Infusions: PRN Meds:.acetaminophen, ondansetron **OR** ondansetron  (ZOFRAN) IV, traZODone   Assessment/Plan: #1. Influenza A. Continue Tamiflu. #2. UTI. Urine culture reveals Escherichia coli. Sensitivities are pending. Blood cultures are negative so far. Continue Cipro for now. #3. Hypokalemia. Repeat  metabolic profile tomorrow. Continue supplementation. #4. Diabetes. Glucose satisfactorily controlled. Active Problems:   Hypertension   Morbid obesity ([place])   Type II diabetes mellitus, uncontrolled ([place])   Atrial fibrillation ([place])   UTI (urinary tract infection)   Influenza A     LOS: 3 days   Earnesteen Birnie 06/07/2016, 7:15 AM

## 2016-06-08 LAB — BASIC METABOLIC PANEL
Anion gap: 4 — ABNORMAL LOW (ref 5–15)
BUN: 6 mg/dL (ref 6–20)
CALCIUM: 7.2 mg/dL — AB (ref 8.9–10.3)
CO2: 30 mmol/L (ref 22–32)
CREATININE: 0.42 mg/dL — AB (ref 0.61–1.24)
Chloride: 106 mmol/L (ref 101–111)
GFR calc non Af Amer: >60 mL/min (ref >60)
Glucose, Bld: 111 mg/dL — ABNORMAL HIGH (ref 65–99)
Potassium: 3.5 mmol/L (ref 3.5–5.1)
SODIUM: 140 mmol/L (ref 135–145)

## 2016-06-08 LAB — GLUCOSE, CAPILLARY: GLUCOSE-CAPILLARY: 106 mg/dL — AB (ref 65–99)

## 2016-06-08 MED ORDER — OSELTAMIVIR PHOSPHATE 75 MG PO CAPS: 75.0000 mg | ORAL_CAPSULE | Freq: Two times a day (BID) | ORAL | 0 refills | Status: DC

## 2016-06-08 MED ORDER — CALCIUM CARBONATE-VITAMIN D 500-200 MG-UNIT PO TABS: 1.0000 | ORAL_TABLET | Freq: Two times a day (BID) | ORAL | 12 refills | Status: DC

## 2016-06-08 MED ORDER — FOSFOMYCIN TROMETHAMINE 3 G PO PACK
3.0000 g | PACK | Freq: Once | ORAL | Status: AC
  Administered 2016-06-08: 3 g via ORAL
  Filled 2016-06-08: qty 3

## 2016-06-08 NOTE — Clinical Social Work Note (Signed)
Pt d/c today back to [facility]. Pt, wife, and facility aware and agreeable. Will transport via [facility].  Benay Pike, [place]

## 2016-06-08 NOTE — Progress Notes (Signed)
Pt IV removed, tolerated well.  Report called to [facility] and SW has arranged for EMS to transport pt to [facility].

## 2016-06-08 NOTE — Care Management Important Message (Signed)
Important Message  Patient Details  Name: Richard Fields MRN: [id number] Date of Birth: [birth year]   Medicare Important Message Given:  Yes    Sherald Barge, RN 06/08/2016, 8:31 AM

## 2016-06-08 NOTE — Discharge Summary (Signed)
Physician Discharge Summary  Richard Fields [id number] DOB: Jun 24, 1940 DOA: 06/04/2016   Admit date: 06/04/2016 Discharge date: 06/08/2016  Discharge Diagnoses:  Active Problems:   Hypertension   Morbid obesity ([place])   Type II diabetes mellitus, uncontrolled ([place])   Atrial fibrillation ([place])   UTI (urinary tract infection)   Influenza A    Wt Readings from Last 3 Encounters:  06/05/16 274 lb 11.2 oz (124.6 kg)  03/15/16 (!) 314 lb (142.4 kg)  12/02/15 285 lb (129.3 kg)     Hospital Course:  This patient is a 76 year old male skilled nursing resident who presented with fever over 102 and malaise. He tested positive for influenza A. He was treated with Tamiflu. His urinalysis revealed too numerous to count white cells. Culture revealed Escherichia coli which was widely resistant. He was initially treated with Cipro but was switched to meropenem on 2/6. He denied dysuria. He will be treated with fosfomycin today. He is feeling better. Fever has resolved. He has been treated for hypokalemia which has normalized to 3.5. He has been treated for hypocalcemia with calcium carbonate and vitamin D. Diabetes has been stable on metformin.  Glucose is 111 this morning. Initially had a mild elevation in lactic acid which normalized.  He is euthyroid with a TSH of 0.86. He has been in sinus rhythm with an EKG revealing sinus tachycardia.  Overall condition is much improved on 2/7. He'll be transferred back to SNF. He remains unable to walk and requires a more advanced level of care than his wife could provide for him at home. He'll be seen in follow-up at SNF.   Discharge Instructions  Discharge Instructions    Diet - low sodium heart healthy    Complete by:  As directed    Increase activity slowly    Complete by:  As directed      Allergies as of 06/08/2016      Reactions   Penicillins Other (See Comments)   Unknown-reaction is unknown   Tegretol [carbamazepine] Itching   Cefepime Rash       Medication List    STOP taking these medications   mirtazapine 15 MG tablet Commonly known as:  REMERON   sucralfate 1 GM/10ML suspension Commonly known as:  CARAFATE     TAKE these medications   acetaminophen 650 MG CR tablet Commonly known as:  TYLENOL Take 650 mg by mouth every 8 (eight) hours as needed for pain.   atorvastatin 10 MG tablet Commonly known as:  LIPITOR Take 10 mg by mouth daily.   benzonatate 200 MG capsule Commonly known as:  TESSALON Take 200 mg by mouth 3 (three) times daily as needed for cough.   calcium-vitamin D 500-200 MG-UNIT tablet Commonly known as:  OSCAL WITH D Take 1 tablet by mouth 2 (two) times daily.   metFORMIN 500 MG tablet Commonly known as:  GLUCOPHAGE Take 1 tablet by mouth 2 (two) times daily.   oseltamivir 75 MG capsule Commonly known as:  TAMIFLU Take 1 capsule (75 mg total) by mouth 2 (two) times daily.   pantoprazole 40 MG tablet Commonly known as:  PROTONIX Take 1 tablet (40 mg total) by mouth 2 (two) times daily before a meal.   pregabalin 150 MG capsule Commonly known as:  LYRICA Take 150 mg by mouth 2 (two) times daily.   traZODone 50 MG tablet Commonly known as:  DESYREL Take 100 mg by mouth at bedtime.        Richard Fields 06/08/2016

## 2016-06-08 NOTE — Care Management Note (Signed)
Case Management Note  Patient Details  Name: TEGEN LANDAU MRN: [id number] Date of Birth: 06-Jun-1940  Additional Comments: Pt discharging back to SNF today. CSW to make arrangements.  Sherald Barge, RN 06/08/2016, 8:31 AM

## 2016-06-09 LAB — CULTURE, BLOOD (ROUTINE X 2)
CULTURE: NO GROWTH
CULTURE: NO GROWTH

## 2016-06-15 ENCOUNTER — Ambulatory Visit (INDEPENDENT_AMBULATORY_CARE_PROVIDER_SITE_OTHER): Payer: Medicare Other | Admitting: Orthopaedic Surgery

## 2016-06-15 ENCOUNTER — Encounter: Payer: Self-pay | Admitting: Orthopaedic Surgery

## 2016-06-15 VITALS — BP 132/77 | HR 112 | Temp 97.5°F

## 2016-06-15 DIAGNOSIS — M25562 Pain in left knee: Secondary | ICD-10-CM

## 2016-06-15 DIAGNOSIS — G8929 Other chronic pain: Secondary | ICD-10-CM

## 2016-06-15 DIAGNOSIS — M25561 Pain in right knee: Secondary | ICD-10-CM | POA: Diagnosis not present

## 2016-06-15 NOTE — Progress Notes (Signed)
CC: Both of my knees are hurting. I would like an injection in both knees.  The patient has had chronic pain and tenderness of both knees for some time.  Injections help.  There is no locking or giving way of the knee.  There is no new trauma. There is no redness or signs of infections.  The knees have a mild effusion and some crepitus.  There is no redness or signs of recent trauma.  Right knee ROM is 0-100 and left knee ROM is 0-95.  Impression:  Chronic pain of the both knees  Return:  2 months  PROCEDURE NOTE:  The patient requests injections of both knees, verbal consent was obtained.  The left and right knee were individually prepped appropriately after time out was performed.   Sterile technique was observed and injection of 1 cc of Depo-Medrol 40 mg with several cc's of plain xylocaine. Anesthesia was provided by ethyl chloride and a 20-gauge needle was used to inject each knee area. The injections were tolerated well.  A band aid dressing was applied.  The patient was advised to apply ice later today and tomorrow to the injection sight as needed.  Electronically Signed Sanjuana Kava, MD 2/14/20181:47 PM

## 2016-07-13 ENCOUNTER — Encounter: Payer: Self-pay | Admitting: Gastroenterology

## 2016-07-13 ENCOUNTER — Ambulatory Visit (INDEPENDENT_AMBULATORY_CARE_PROVIDER_SITE_OTHER): Payer: Medicare Other | Admitting: Gastroenterology

## 2016-07-13 VITALS — BP 135/75 | HR 76 | Temp 97.6°F | Ht 72.0 in

## 2016-07-13 DIAGNOSIS — K264 Chronic or unspecified duodenal ulcer with hemorrhage: Secondary | ICD-10-CM

## 2016-07-13 NOTE — Patient Instructions (Signed)
Continue Protonix once daily.  We will see you back in 6 months!  Keep up the good work!

## 2016-07-13 NOTE — Progress Notes (Signed)
cc'ed to pcp °

## 2016-07-13 NOTE — Assessment & Plan Note (Signed)
Multiple medical issues, rehab at [facility]. Doing well from a GI standpoint. EGD with large duodenal ulcer but no stigmata of bleeding in Nov 2017. No need for surveillance unless symptomatic or acute blood loss anemia/acute GI bleeding. Will have him remain on Protonix indefinitely. Need to get most recent CBC from outside facility. Return in 6 months.

## 2016-07-13 NOTE — Progress Notes (Signed)
Referring Provider: Asencion Noble, MD Primary Care Physician:  Asencion Noble, MD Primary GI: Dr. Gala Romney   Chief Complaint  Patient presents with  . GI bleed    hosp f/u, doing ok    HPI:   Richard Fields is a 76 y.o. male presenting today with a history of GI bleed secondary to duodenal ulcer Nov 2017, performed by Dr. Cristina Gong. No endoscopic surveillance recommended unless symptomatic, worsening anemia. Advised to avoid NSAIDs. Colonoscopy due in 2021.   Doing well from a GI standpoint. No dysphagia, no abdominal pain, no N/V. No melena or hematochezia. Protonix once daily. No constipation or diarrhea. Remains at [facility] with slow progression in mobility, but his wife and sister-in-law states he continues to make forward progress. Hopefully, he will be home in a month per their report. They are concerned regarding his medication list, stating he used to be on Lipitor and Neurontin. They will discuss this with his provider.   Past Medical History:  Diagnosis Date  . Cervical disc disease   . Chronic constipation   . Diabetes mellitus without complication ([place])    Borderline diabetic-diet controlled  . Duodenal ulcer 03/2016  . Empyema ([place]) 01/2016  . Hyperlipidemia   . Hypertension   . Peripheral neuropathy [facility])     Past Surgical History:  Procedure Laterality Date  . COLONOSCOPY  2003   RMR: internal hemorrhoids, otherwise normal  . COLONOSCOPY  05/30/2012   Dr. Gala Romney: redundant colon with single polyp removed (too small to process), internal hemorrhoids. surveillance in 7 years    . ESOPHAGOGASTRODUODENOSCOPY (EGD) WITH PROPOFOL N/A 03/14/2016   Dr. Cristina Gong: one non-bleeding duodenal ulcer  . FLEXIBLE BRONCHOSCOPY N/A 03/07/2016   Procedure: FLEXIBLE VIDEO BRONCHOSCOPY;  Surgeon: Gaye Pollack, MD;  Location: [facility];  Service: Thoracic;  Laterality: N/A;  . HERNIA REPAIR    . TONSILLECTOMY    . VIDEO ASSISTED THORACOSCOPY (VATS)/THOROCOTOMY Right 03/07/2016   Procedure:  VIDEO ASSISTED THORACOSCOPY (VATS);  Surgeon: Gaye Pollack, MD;  Location: [facility] OR;  Service: Thoracic;  Laterality: Right;    Current Outpatient Prescriptions  Medication Sig Dispense Refill  . acetaminophen (TYLENOL) 650 MG CR tablet Take 650 mg by mouth every 8 (eight) hours as needed for pain.    . benzonatate (TESSALON) 200 MG capsule Take 200 mg by mouth 3 (three) times daily as needed for cough.    . calcium-vitamin D (OSCAL WITH D) 500-200 MG-UNIT tablet Take 1 tablet by mouth 2 (two) times daily. 60 tablet 12  . metFORMIN (GLUCOPHAGE) 500 MG tablet Take 1 tablet by mouth 2 (two) times daily.    . pantoprazole (PROTONIX) 40 MG tablet Take 40 mg by mouth daily.    . pregabalin (LYRICA) 150 MG capsule Take 150 mg by mouth 2 (two) times daily.    . traZODone (DESYREL) 50 MG tablet Take 50 mg by mouth at bedtime.      No current facility-administered medications for this visit.     Allergies as of 07/13/2016 - Review Complete 07/13/2016  Allergen Reaction Noted  . Penicillins Other (See Comments) 05/21/2012  . Tegretol [carbamazepine] Itching 05/21/2012  . Cefepime Rash 03/10/2016    Family History  Problem Relation Age of Onset  . Cancer Father   . Colon cancer Paternal Uncle   . Diabetes Brother     Social History   Social History  . Marital status: Married    Spouse name: N/A  . Number of children: N/A  .  Years of education: N/A   Occupational History  .  [employer]     retired   Social History Main Topics  . Smoking status: Former Smoker    Years: 40.00  . Smokeless tobacco: Former Systems developer    Types: Chew  . Alcohol use No  . Drug use: No  . Sexual activity: Not Asked   Other Topics Concern  . None   Social History Narrative  . None    Review of Systems: As mentioned in HPI   Physical Exam: BP 135/75   Pulse 76   Temp 97.6 F (36.4 C) (Oral)   Ht 6' (1.829 m)  General:   Alert and oriented. No distress noted. Pleasant and  cooperative.  Head:  Normocephalic and atraumatic. Eyes:  Conjuctiva clear without scleral icterus. Mouth:  Oral mucosa pink and moist. Abdomen:  +BS, soft, non-tender and non-distended. No rebound or guarding. No HSM or masses noted. Limited with patient in wheelchair  Extremities:  Without edema. Neurologic:  Alert and  oriented x4;  grossly normal neurologically. Psych:  Alert and cooperative. Normal mood and affect.

## 2016-08-10 ENCOUNTER — Telehealth: Payer: Self-pay | Admitting: Gastroenterology

## 2016-08-10 NOTE — Telephone Encounter (Signed)
I called [facility] and spoke with Ramona. She is going to fax [place] what they have (Dec 2017). I also sent a request to Dr Ria Comment office for most recent CBC

## 2016-08-10 NOTE — Telephone Encounter (Signed)
Can we request again most recent CBC from facility? I think we did when he was here in March. I just hadn't received yet.

## 2016-08-16 ENCOUNTER — Ambulatory Visit (INDEPENDENT_AMBULATORY_CARE_PROVIDER_SITE_OTHER): Payer: Medicare Other | Admitting: Orthopaedic Surgery

## 2016-08-16 ENCOUNTER — Encounter: Payer: Self-pay | Admitting: Orthopaedic Surgery

## 2016-08-16 VITALS — BP 107/67 | HR 71 | Temp 97.5°F

## 2016-08-16 DIAGNOSIS — G8929 Other chronic pain: Secondary | ICD-10-CM

## 2016-08-16 DIAGNOSIS — M25562 Pain in left knee: Secondary | ICD-10-CM | POA: Diagnosis not present

## 2016-08-16 DIAGNOSIS — M25561 Pain in right knee: Secondary | ICD-10-CM | POA: Diagnosis not present

## 2016-08-16 NOTE — Progress Notes (Signed)
CC: Both of my knees are hurting. I would like an injection in both knees.  The patient has had chronic pain and tenderness of both knees for some time.  Injections help.  There is no locking or giving way of the knee.  There is no new trauma. There is no redness or signs of infections.  The knees have a mild effusion and some crepitus.  There is no redness or signs of recent trauma.  Right knee ROM is 0-100 and left knee ROM is 0-110.  Impression:  Chronic pain of the both knees  Return:  2 months  PROCEDURE NOTE:  The patient requests injections of both knees, verbal consent was obtained.  The left and right knee were individually prepped appropriately after time out was performed.   Sterile technique was observed and injection of 1 cc of Depo-Medrol 40 mg with several cc's of plain xylocaine. Anesthesia was provided by ethyl chloride and a 20-gauge needle was used to inject each knee area. The injections were tolerated well.  A band aid dressing was applied.  The patient was advised to apply ice later today and tomorrow to the injection sight as needed.  He is at nursing home.  I will see him in two months.  Electronically Signed Sanjuana Kava, MD 4/17/20182:42 PM

## 2016-08-30 NOTE — Telephone Encounter (Signed)
No recent CBC on file from PCP more updated than what we have in Feb 2018, which was 10.7. This is similar to Dec 2017 of 10.9.

## 2016-10-20 ENCOUNTER — Ambulatory Visit: Payer: Medicare Other | Admitting: Orthopaedic Surgery

## 2016-12-01 ENCOUNTER — Ambulatory Visit (INDEPENDENT_AMBULATORY_CARE_PROVIDER_SITE_OTHER): Payer: Medicare Other | Admitting: Orthopaedic Surgery

## 2016-12-01 ENCOUNTER — Encounter: Payer: Self-pay | Admitting: Orthopaedic Surgery

## 2016-12-01 VITALS — BP 117/65 | HR 87 | Temp 97.7°F

## 2016-12-01 DIAGNOSIS — M25561 Pain in right knee: Secondary | ICD-10-CM | POA: Diagnosis not present

## 2016-12-01 DIAGNOSIS — G8929 Other chronic pain: Secondary | ICD-10-CM

## 2016-12-01 NOTE — Progress Notes (Signed)
CC:  I have pain of my right knee. I would like an injection.  The patient has chronic pain of the right knee.  There is no recent trauma.  There is no redness.  Injections in the past have helped.  The knee has no redness, has an effusion and crepitus present.  ROM of the right knee is 0-100.  Impression:  Chronic knee pain right  Return: 2 months  PROCEDURE NOTE:  The patient requests injections of the right knee , verbal consent was obtained.  The right knee was prepped appropriately after time out was performed.   Sterile technique was observed and injection of 1 cc of Depo-Medrol 40 mg with several cc's of plain xylocaine. Anesthesia was provided by ethyl chloride and a 20-gauge needle was used to inject the knee area. The injection was tolerated well.  A band aid dressing was applied.  The patient was advised to apply ice later today and tomorrow to the injection sight as needed.  Electronically Signed Sanjuana Kava, MD 8/2/20182:52 PM

## 2017-01-11 ENCOUNTER — Ambulatory Visit: Payer: Medicare Other | Admitting: Gastroenterology

## 2017-02-07 ENCOUNTER — Ambulatory Visit: Payer: Medicare Other | Admitting: Orthopaedic Surgery

## 2017-02-08 ENCOUNTER — Ambulatory Visit (INDEPENDENT_AMBULATORY_CARE_PROVIDER_SITE_OTHER): Payer: Medicare Other | Admitting: Orthopaedic Surgery

## 2017-02-08 VITALS — BP 115/69 | HR 65

## 2017-02-08 DIAGNOSIS — G8929 Other chronic pain: Secondary | ICD-10-CM | POA: Diagnosis not present

## 2017-02-08 DIAGNOSIS — M25561 Pain in right knee: Secondary | ICD-10-CM

## 2017-02-08 NOTE — Progress Notes (Signed)
CC:  I have pain of my right knee. I would like an injection.  The patient has chronic pain of the right knee.  There is no recent trauma.  There is no redness.  Injections in the past have helped.  The knee has no redness, has an effusion and crepitus present.  ROM of the right knee is 0-95.  Impression:  Chronic knee pain right  Return: 6 weeks  PROCEDURE NOTE:  The patient requests injections of the right knee , verbal consent was obtained.  The right knee was prepped appropriately after time out was performed.   Sterile technique was observed and injection of 1 cc of Depo-Medrol 40 mg with several cc's of plain xylocaine. Anesthesia was provided by ethyl chloride and a 20-gauge needle was used to inject the knee area. The injection was tolerated well.  A band aid dressing was applied.  The patient was advised to apply ice later today and tomorrow to the injection sight as needed.  Electronically Signed Sanjuana Kava, MD 10/10/201811:12 AM

## 2017-03-08 ENCOUNTER — Encounter: Payer: Self-pay | Admitting: Neurology

## 2017-03-08 ENCOUNTER — Ambulatory Visit: Payer: Medicare Other | Admitting: Neurology

## 2017-03-08 VITALS — BP 108/68 | HR 67 | Ht 71.0 in

## 2017-03-08 DIAGNOSIS — E1140 Type 2 diabetes mellitus with diabetic neuropathy, unspecified: Secondary | ICD-10-CM | POA: Diagnosis not present

## 2017-03-08 MED ORDER — GABAPENTIN 300 MG PO CAPS: ORAL_CAPSULE | ORAL | 11 refills | Status: DC

## 2017-03-08 NOTE — Patient Instructions (Addendum)
Since the Lyrica is not helping, we will taper the Lyrica, and start you on Gabapentin.   Reduce the Lyrica 150 mg 1 pill 2 times a day, morning and evening for one month, then only one pill at night for one month, then off.  Start Neurontin (gabapentin) 300 mg strength: One pill daily midday for one month, then one in AM and one at midday for one month, then 1 pill 3 times a day.  The most common side effects reported are sedation or sleepiness. Rare side effects include balance problems, confusion.   We may consider adding cymbalta next.   We will do a recheck with nurse practitioner in about 3 month.

## 2017-03-08 NOTE — Progress Notes (Signed)
Subjective:    Patient ID: Richard Fields is a 76 y.o. male.  HPI     Star Age, MD, PhD [facility] [address] [place], [place] [address]  Dear Dr. Willey Blade,   I saw your patient, Richard Fields, upon your kind request in [facility] today for initial consultation of his neuropathy of many years duration. The patient is accompanied by his wife and Nunzio Cory his daytime caretaker today. As you know, Richard Fields is a 76 year old right-handed gentleman with an underlying complex medical history of hypertension, hyperlipidemia, type 2 diabetes, obesity, degenerative neck disease, history of duodenal ulcer, insomnia, and degenerative joint disease of the right knee, who has a long-standing history of neuropathy. I have previously seen him for gait disorder over 3 years ago as well as history of recurrent falls. 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 He is currently on Lyrica 150 mg 3 times a day, trazodone 50 mg each night, not on any narcotic pain medication. He has been practically wheelchair-bound. He lives at home with his wife. He has no children. Nunzio Cory is part of their caretaker team. He has no history of alcohol use or smoking. He complains of leg pain particularly at night. His neuropathy affects both legs up to the knees approximately. He is unable to sleep through the night. He spends his day in a lift chair and catnaps throughout the day. He has a recent history of hospitalizations. He had pneumonia last year in October and was in the hospital from  02/18/2016 through 03/15/2016. He was discharged to skilled nursing and was there until April approximately of this year. In the interim he also had a hospital stay in February 2018 for flu. When he was hospitalized last year he had a complicated stay and required intubation between 02/20/2016 through 02/23/2016. He also had GI bleed and required blood transfusion. He was on gabapentin in the past. Unclear dose. No side effects reported. He has not been on Cymbalta. He cannot take nonsteroidal secondary to GI bleed.  Previously:  09/10/2013: Richard Fields is a 76 year old right-handed gentleman with an underlying medical history of obesity, arthritis, affecting both knees, cervical disc disease for which he is on as needed use of hydrocodone, diabetes (not on meds, last A1c of 6.6), hyperlipidemia, hypertension, history of neuropathy for which he is on Lyrica 150 mg twice daily and amitriptyline 100 mg each night, and insomnia, for which he is on Ativan 2 mg each night, who reports balance issues and recurrent falls for years, worse in the last 2-3 months. He had a brain MRI without contrast on 03/12/2013: Moderate atrophy with mild to moderate chronic microvascular ischemic change. No acute stroke or visible hemorrhage. 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 No significant compression of the underlying brain. In addition, personally reviewed the images through the PACS system. He does not have a history of heavy alcohol use and quit smoking in the 80s. He has had concussions before, playing football when he was young.  He has been using a walker for years, but fell recently about 2 weeks ago and  sustained a fracture of his right 5th metacarpal. He has not had a syncope or vertigo symptoms. He is currently in a WC. He has a cast on his R hand/wrist. He does not snore and has no apneas per wife.  He was seen for his neuropathy in our  office years ago, presumably by Dr. Brett Fairy.   His Past Medical History Is Significant For: Past Medical History:  Diagnosis Date  . Cervical disc disease   . Chronic constipation   . Depression   . Diabetes mellitus without complication ([place])    Borderline diabetic-diet controlled  . Duodenal ulcer 03/2016  . Empyema ([place]) 01/2016  . Hyperlipidemia   . Hypertension   . Peripheral neuropathy     His Past Surgical History Is Significant For: Past Surgical History:  Procedure Laterality Date  . COLONOSCOPY  2003   RMR: internal hemorrhoids, otherwise normal  . HERNIA REPAIR    . TONSILLECTOMY      His Family History Is Significant For: Family History  Problem Relation Age of Onset  . Cancer Father   . Colon cancer Paternal Uncle   . Diabetes Brother     His Social History Is Significant For: Social History   Socioeconomic History  . Marital status: Married    Spouse name: None  . Number of children: 0  . Years of education: BA  . Highest education level: None  Social Needs  . Financial resource strain: None  . Food insecurity - worry: None  . Food insecurity - inability: None  . Transportation needs - medical: None  . Transportation needs - non-medical: None  Occupational History  . Occupation: [employer]    Comment: retired  Tobacco Use  . Smoking status: Former Smoker    Years: 40.00  . Smokeless tobacco: Former Systems developer    Types: Chew  Substance and Sexual Activity  . Alcohol use: No  . Drug use: No  . Sexual activity: None  Other Topics Concern  . None  Social History Narrative   Right handed    Lives with wife   Caffeine use: none    His Allergies Are:  Allergies  Allergen Reactions  . Penicillins Other (See Comments)    Unknown-reaction is unknown  . Tegretol [Carbamazepine] Itching  . Cefepime Rash  :   His Current Medications Are:  Outpatient Encounter Medications as of 03/08/2017  Medication Sig  . acetaminophen  (TYLENOL) 650 MG CR tablet Take 650 mg by mouth every 8 (eight) hours as needed for pain.  . benzonatate (TESSALON) 200 MG capsule Take 200 mg by mouth 3 (three) times daily as needed for cough.  . metFORMIN (GLUCOPHAGE) 500 MG tablet Take 1 tablet by mouth 2 (two) times daily.  . Misc Natural Products (OSTEO BI-FLEX JOINT SHIELD PO) Take 1 Dose 3 (three) times daily by mouth.  . Multiple Vitamins-Minerals (CENTRUM SILVER PO) Take 1 tablet daily by mouth.  . pantoprazole (PROTONIX) 40 MG tablet Take 40 mg 2 (two) times daily by mouth.   . pregabalin (LYRICA) 150 MG capsule Take 150 mg 3 (three) times daily by mouth.   . traZODone (DESYREL) 50 MG tablet Take 50 mg by mouth at bedtime.   . [DISCONTINUED] calcium-vitamin D (OSCAL WITH D) 500-200 MG-UNIT tablet Take 1 tablet by mouth 2 (two) times daily.   No facility-administered encounter medications on file as of 03/08/2017.   : Review of Systems:  Out of a complete 14 point review of  systems, all are reviewed and negative with the exception of these symptoms as listed below:  Review of Systems  Neurological: Positive for weakness and numbness.       Patient c/o insomnia d/t pain in feet/legs. Keeps him up at night. Has had neuropathy for years. Was previously seen here in 2015 for same sx. Dr Willey Blade increased Lyrica recently to TID but ineffective in helping with pain so he referred him back to our office.  Also c/o memory loss.     Objective:  Neurological Exam  Physical Exam Physical Examination:   Vitals:   03/08/17 1426  BP: 108/68  Pulse: 67  SpO2: 94%   General Examination: The patient is a very pleasant 76 y.o. male in no acute distress. He appears chronically ill, in WC.   HEENT: Normocephalic, atraumatic, pupils are equal, round and reactive to light and accommodation. Extraocular tracking is fair. S/p cataract repairs. Hearing is grossly intact. Face is symmetric with normal facial animation and normal facial sensation.  Speech is clear with no dysarthria noted. There is no hypophonia. There is no lip, neck/head, jaw or voice tremor. Neck is supple with full range of passive and active motion. There are no carotid bruits on auscultation. Oropharynx exam reveals: moderate mouth dryness, marginal dental hygiene and several missing teeth and mild airway crowding. Mallampati is class I. Tongue protrudes centrally and palate elevates symmetrically. Tonsils are absent.   Chest: Clear to auscultation without wheezing, rhonchi or crackles noted.  Heart: S1+S2+0, regular and normal without murmurs, rubs or gallops noted.   Abdomen: Soft, non-tender and non-distended with normal bowel sounds appreciated on auscultation.  Extremities: There is 1+ pitting edema in the distal lower extremities bilaterally.   Skin: Warm and dry without trophic changes noted. Chronic changes and discoloration is seen in the distal LEs.   Musculoskeletal: exam reveals no obvious joint deformities, tenderness or joint swelling or erythema.   Neurologically:  Mental status: The patient is awake, alert and oriented in all 4 spheres. His immediate and remote memory, attention, language skills and fund of knowledge are fairly, minimally verbal. Speech is scant. Mood is normal and affect is normal.  Cranial nerves II - XII are as described above under HEENT exam.  Motor exam: thin bulk, global strength of 4/5. Romberg is not testable. Reflexes are 1+ in UEs and absent in the LEs. Fine motor skills and coordination: globally impaired.   Cerebellar testing: No dysmetria or intention tremor. Heel to shin is not possible.  Sensory exam: Decreased to all modalities up to right above knees bilaterally in the lower extremities and up to the wrists in the upper extremities.    Gait, station and balance: He  is unable to stand or walk for me.   Assessment and Plan:   In summary, Richard Fields is a very pleasant 76 year old male with an  underlying  complex medical history of arthritis, cervical spine disease, hypertension, hyperlipidemia, diabetes, hospitalization last year for pneumonia with prolonged and complicated hospital course, history of flu earlier this year, and obesity, who presents for reevaluation of his long-standing history of severe neuropathy. He had testing for this in the past. We mutually agreed not to put him through additional electrophysiological testing at this time. His history and exam are in keeping with significant peripheral neuropathy. He has pain in his legs primarily. He has pain at night and difficulty sleeping at night. I suggested we tapered him off of Lyrica at this point. He  has been tried on amitriptyline as I recall and had tried gabapentin in the past. We will restart gabapentin with 300 mg strength. At this point he will reduce Lyrica to 150 mg twice daily and  start gabapentin 300 mg midday. After a month he will reduce the Lyrica to once nightly and increase the gabapentin to morning and midday, after another month he will increase the gabapentin to 300 mg 3 times a day and be off of the Lyrica. We will do a recheck with one of our nurse practitioners in about 3 months, sooner as needed. We may increase the gabapentin further. I would be cautious with his medication given his daytime somnolence and balance issues as well as age. We may add Cymbalta in the future.  I answered all their questions today and the patient and his wife and caretaker were in agreement.  Thank you very much for allowing me to participate in the care of this nice patient. If I can be of any further assistance to you please do not hesitate to call me at [phone].  Sincerely,   Star Age, MD, PhD

## 2017-03-22 ENCOUNTER — Ambulatory Visit: Payer: Medicare Other | Admitting: Orthopaedic Surgery

## 2017-03-22 ENCOUNTER — Encounter: Payer: Self-pay | Admitting: Orthopaedic Surgery

## 2017-03-22 DIAGNOSIS — G8929 Other chronic pain: Secondary | ICD-10-CM

## 2017-03-22 DIAGNOSIS — M25561 Pain in right knee: Secondary | ICD-10-CM | POA: Diagnosis not present

## 2017-03-22 NOTE — Progress Notes (Signed)
CC:  I have pain of my right knee. I would like an injection.  The patient has chronic pain of the right knee.  There is no recent trauma.  There is no redness.  Injections in the past have helped.  The knee has no redness, has an effusion and crepitus present.  ROM of the right knee is 0-95.  Impression:  Chronic knee pain right  Return: 6 weeks  PROCEDURE NOTE:  The patient requests injections of the right knee , verbal consent was obtained.  The right knee was prepped appropriately after time out was performed.   Sterile technique was observed and injection of 1 cc of Depo-Medrol 40 mg with several cc's of plain xylocaine. Anesthesia was provided by ethyl chloride and a 20-gauge needle was used to inject the knee area. The injection was tolerated well.  A band aid dressing was applied.  The patient was advised to apply ice later today and tomorrow to the injection sight as needed.  Electronically Signed Sanjuana Kava, MD 11/21/201810:59 AM

## 2017-04-10 ENCOUNTER — Ambulatory Visit: Payer: Medicare Other | Admitting: Gastroenterology

## 2017-05-03 ENCOUNTER — Ambulatory Visit: Payer: Medicare Other | Admitting: Orthopaedic Surgery

## 2017-05-09 ENCOUNTER — Ambulatory Visit: Payer: Medicare Other | Admitting: Orthopaedic Surgery

## 2017-05-09 ENCOUNTER — Encounter: Payer: Self-pay | Admitting: Orthopaedic Surgery

## 2017-05-09 DIAGNOSIS — G8929 Other chronic pain: Secondary | ICD-10-CM

## 2017-05-09 DIAGNOSIS — M25561 Pain in right knee: Secondary | ICD-10-CM | POA: Diagnosis not present

## 2017-05-09 NOTE — Progress Notes (Signed)
CC:  I have pain of my right knee. I would like an injection.  The patient has chronic pain of the right knee.  There is no recent trauma.  There is no redness.  Injections in the past have helped.  The knee has no redness, has an effusion and crepitus present.  ROM of the right knee is 0-100.  Impression:  Chronic knee pain right  Return: 6 weeks  PROCEDURE NOTE:  The patient requests injections of the right knee , verbal consent was obtained.  The right knee was prepped appropriately after time out was performed.   Sterile technique was observed and injection of 1 cc of Depo-Medrol 40 mg with several cc's of plain xylocaine. Anesthesia was provided by ethyl chloride and a 20-gauge needle was used to inject the knee area. The injection was tolerated well.  A band aid dressing was applied.  The patient was advised to apply ice later today and tomorrow to the injection sight as needed.  Electronically Signed Sanjuana Kava, MD 1/8/201910:42 AM

## 2017-06-02 ENCOUNTER — Ambulatory Visit: Payer: Medicare Other | Admitting: Gastroenterology

## 2017-06-02 ENCOUNTER — Encounter: Payer: Self-pay | Admitting: Gastroenterology

## 2017-06-02 VITALS — BP 112/77 | HR 69 | Temp 97.1°F | Ht 71.0 in

## 2017-06-02 DIAGNOSIS — K279 Peptic ulcer, site unspecified, unspecified as acute or chronic, without hemorrhage or perforation: Secondary | ICD-10-CM | POA: Diagnosis not present

## 2017-06-02 NOTE — Patient Instructions (Addendum)
As you are not taking any Ibuprofen, Advil, Motrin, Aleve, Naproxen, Naprosyn, or aspirin products, we can try to wean off of the Protonix (pantoprazole).   You may start taking pantoprazole every other day, 30 minutes before breakfast. Give this about two weeks, then see if you can come off of it. There is a chance you may have rebound symptoms such as indigestion, heartburn, nausea, etc. If you do, start back to taking the pantoprazole once each morning, 30 minutes before breakfast.   Please let us know how you do with coming off of it, so we can document this in the chart.  Continue to avoid all non-steroidal anti-inflammatory medications, aspirin, etc.   We will see you in 6 months!

## 2017-06-02 NOTE — Progress Notes (Signed)
Referring Provider: Asencion Noble, MD Primary Care Physician:  Asencion Noble, MD Primary GI: Dr. Gala Romney   Chief Complaint  Patient presents with  . ulcer    doing ok    HPI:   Richard Fields is a 77 y.o. male presenting today with a history of GI bleed secondary to duodenal ulcer Nov 2017,EGD  performed by Dr. Cristina Gong. No endoscopic surveillance recommended unless symptomatic, worsening anemia. Advised to avoid NSAIDs. Colonoscopy due in 2021. EGD in Nov 2017 with 1.4 cm non-bleeding cratered duodenal ulcer without stigmata of bleeding was noted. NEGATIVE H.pylori on path.   He has no abdominal pain, N/V. He has been on long-term PPI therapy daily and wants to come off of this due to polypharmacy. His family member (niece) is present with him today. Duodenal ulcer was felt to be related to NSAIDs. He is not taking any NSAIDs. Med list reviewed as well.    Past Medical History:  Diagnosis Date  . Cervical disc disease   . Chronic constipation   . Depression   . Diabetes mellitus without complication ([place])    Borderline diabetic-diet controlled  . Duodenal ulcer 03/2016  . Empyema ([place]) 01/2016  . Hyperlipidemia   . Hypertension   . Peripheral neuropathy     Past Surgical History:  Procedure Laterality Date  . COLONOSCOPY  2003   RMR: internal hemorrhoids, otherwise normal  . COLONOSCOPY  05/30/2012   Dr. Gala Romney: redundant colon with single polyp removed (too small to process), internal hemorrhoids. surveillance in 7 years    . ESOPHAGOGASTRODUODENOSCOPY (EGD) WITH PROPOFOL N/A 03/14/2016   Dr. Cristina Gong: one non-bleeding duodenal ulcer  . FLEXIBLE BRONCHOSCOPY N/A 03/07/2016   Procedure: FLEXIBLE VIDEO BRONCHOSCOPY;  Surgeon: Gaye Pollack, MD;  Location: [place];  Service: Thoracic;  Laterality: N/A;  . HERNIA REPAIR    . TONSILLECTOMY    . VIDEO ASSISTED THORACOSCOPY (VATS)/THOROCOTOMY Right 03/07/2016   Procedure: VIDEO ASSISTED THORACOSCOPY (VATS);  Surgeon: Gaye Pollack,  MD;  [facility];  Service: Thoracic;  Laterality: Right;    Current Outpatient Medications  Medication Sig Dispense Refill  . acetaminophen (TYLENOL) 650 MG CR tablet Take 650 mg by mouth every 8 (eight) hours as needed for pain.    Marland Kitchen gabapentin (NEURONTIN) 600 MG tablet Take 1 tablet by mouth 3 (three) times daily.    . metFORMIN (GLUCOPHAGE) 500 MG tablet Take 1 tablet by mouth 2 (two) times daily.    . Multiple Vitamins-Minerals (CENTRUM SILVER PO) Take 1 tablet daily by mouth.    . pantoprazole (PROTONIX) 40 MG tablet Take 40 mg by mouth daily.     . traZODone (DESYREL) 50 MG tablet Take 50 mg by mouth at bedtime.     . benzonatate (TESSALON) 200 MG capsule Take 200 mg by mouth 3 (three) times daily as needed for cough.    . gabapentin (NEURONTIN) 300 MG capsule One pill daily midday for one month, then one in AM and one at midday for one month, then 1 pill 3 times a day. (Patient not taking: Reported on 06/02/2017) 90 capsule 11  . Misc Natural Products (OSTEO BI-FLEX JOINT SHIELD PO) Take 1 Dose 3 (three) times daily by mouth.    . pregabalin (LYRICA) 150 MG capsule Take 150 mg 3 (three) times daily by mouth.      No current facility-administered medications for this visit.     Allergies as of 06/02/2017 - Review Complete 06/02/2017  Allergen Reaction  Noted  . Penicillins Other (See Comments) 05/21/2012  . Tegretol [carbamazepine] Itching 05/21/2012  . Cefepime Rash 03/10/2016    Family History  Problem Relation Age of Onset  . Cancer Father   . Colon cancer Paternal Uncle   . Diabetes Brother     Social History   Socioeconomic History  . Marital status: Married    Spouse name: None  . Number of children: 0  . Years of education: BA  . Highest education level: None  Social Needs  . Financial resource strain: None  . Food insecurity - worry: None  . Food insecurity - inability: None  . Transportation needs - medical: None  . Transportation needs - non-medical:  None  Occupational History  . Occupation: [employer]    Comment: retired  Tobacco Use  . Smoking status: Former Smoker    Years: 40.00  . Smokeless tobacco: Former Systems developer    Types: Chew  Substance and Sexual Activity  . Alcohol use: No  . Drug use: No  . Sexual activity: None  Other Topics Concern  . None  Social History Narrative   Right handed    Lives with wife   Caffeine use: none    Review of Systems: Gen: Denies fever, chills, anorexia. Denies fatigue, weakness, weight loss.  CV: Denies chest pain, palpitations, syncope, peripheral edema, and claudication. Resp: Denies dyspnea at rest, cough, wheezing, coughing up blood, and pleurisy. GI: see HPI  Derm: Denies rash, itching, dry skin Psych: Denies depression, anxiety, memory loss, confusion. No homicidal or suicidal ideation.  Heme: Denies bruising, bleeding, and enlarged lymph nodes.  Physical Exam: BP 112/77   Pulse 69   Temp (!) 97.1 F (36.2 C) (Oral)   Ht 5\' 11"  (1.803 m)   BMI 38.31 kg/m  General:   Alert and oriented. No distress noted. Pleasant and cooperative.  Head:  Normocephalic and atraumatic. Eyes:  Conjuctiva clear without scleral icterus. Mouth:  Oral mucosa pink and moist.  Abdomen:  +BS, soft, non-tender and non-distended. Limited exam with patient sitting in wheelchair Msk:  Symmetrical without gross deformities. Normal posture. Extremities:  Without edema. Neurologic:  Alert and  oriented x4 Psych:  Alert and cooperative. Normal mood and affect.

## 2017-06-05 DIAGNOSIS — K279 Peptic ulcer, site unspecified, unspecified as acute or chronic, without hemorrhage or perforation: Secondary | ICD-10-CM | POA: Insufficient documentation

## 2017-06-05 NOTE — Assessment & Plan Note (Signed)
77 year old pleasant male with history of non-bleeding duodenal ulcer in 2017 secondary to NSAID use at that time. Negative H.pylori on path. He has been without any concerning upper GI symptoms. Continues to abstain from all NSAIDs. I reviewed med list with patient as well. Family member present. Both the patient and family member desire to come off of chronic PPI therapy due to polypharmacy and potential side effects with long-term use. We reviewed his history in detail: no history of Barrett's or refractory/recurrent PUD, abstaining from NSAIDs, prior ulcer less than 2 cm, and ulcer was not idiopathic. Per guidelines with ACG, reviewed with patient and family member during office visit, could consider deprescribing PPI. As he has been on it long-term, will slowly wean therapy. However, if he has any recurrent GERD symptoms, he is to resume therapy once daily. I also discussed the potential for rebound hypersecretion after being on PPI therapy chronically, and he is to continue dietary/behavior modification and continue PPI if symptomatic. Also, if he were to need low dose aspirin or similar for cardioprotective reasons, then would need to resume PPI. As his history of PUD is straightforward, no contraindication currently to attempt deprescribing. Return in 6 months for close follow-up.

## 2017-06-06 NOTE — Progress Notes (Signed)
cc'd to pcp 

## 2017-06-09 ENCOUNTER — Ambulatory Visit: Payer: Medicare Other | Admitting: Nurse Practitioner

## 2017-06-20 ENCOUNTER — Encounter: Payer: Self-pay | Admitting: Orthopaedic Surgery

## 2017-06-20 ENCOUNTER — Ambulatory Visit: Payer: Medicare Other | Admitting: Orthopaedic Surgery

## 2017-06-20 DIAGNOSIS — M25561 Pain in right knee: Secondary | ICD-10-CM

## 2017-06-20 DIAGNOSIS — G8929 Other chronic pain: Secondary | ICD-10-CM | POA: Diagnosis not present

## 2017-06-20 DIAGNOSIS — I10 Essential (primary) hypertension: Secondary | ICD-10-CM

## 2017-06-20 NOTE — Progress Notes (Signed)
CC:  I have pain of my right knee. I would like an injection.  The patient has chronic pain of the right knee.  There is no recent trauma.  There is no redness.  Injections in the past have helped.  The knee has no redness, has an effusion and crepitus present.  ROM of the right knee is 0-100.  Impression:  Chronic knee pain right  Return: 6 weeks  PROCEDURE NOTE:  The patient requests injections of the right knee , verbal consent was obtained.  The right knee was prepped appropriately after time out was performed.   Sterile technique was observed and injection of 1 cc of Depo-Medrol 40 mg with several cc's of plain xylocaine. Anesthesia was provided by ethyl chloride and a 20-gauge needle was used to inject the knee area. The injection was tolerated well.  A band aid dressing was applied.  The patient was advised to apply ice later today and tomorrow to the injection sight as needed.  Electronically Signed Sanjuana Kava, MD 2/19/201910:10 AM

## 2017-07-07 NOTE — Progress Notes (Addendum)
[facility]  PATIENT: Richard Fields DOB: 1940/05/06   REASON FOR VISIT: Follow-up for neuropathy  HISTORY FROM: Patient and nephew    HISTORY OF PRESENT ILLNESS:UPDATE 3/11/2019[name], 77 year old male returns for follow-up with his nephew.  He has a long standing history of hypertension hyperlipidemia diabetes obesity degenerative neck disease degenerative joint disease of the knees and long-standing history of neuropathy.  When last seen he was on the multiple sedating medications to include amitriptyline and Lyrica Ativan and narcotic pain medication.  He was asked to taper off of his Lyrica per Dr. Rexene Alberts and placed on  Gabapentin.  He is currently taking 600 mg 3 times a day.  He was asked to spread it out, rather than taking the last 2 doses close together.  He is seated in a wheelchair but says he gets around the home with a walker.  He returns for reevaluation  11/7/18SAJames Fields, upon your kind request in my neurologic clinic today for initial consultation of his neuropathy of many years duration. The patient is accompanied by his wife and Richard Fields his daytime caretaker today. As you know, Richard Fields is a 77 year old right-handed gentleman with an underlying complex medical history of hypertension, hyperlipidemia, type 2 diabetes, obesity, degenerative neck disease, history of duodenal ulcer, insomnia, and degenerative joint disease of the right knee, who has a long-standing history of neuropathy. I have previously seen him for gait disorder over 3 years ago as well as history of recurrent falls. 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 He is currently on Lyrica 150 mg 3 times a day, trazodone 50 mg each night, not on any narcotic pain medication. He has been practically wheelchair-bound. He lives at home with his wife. He has no children. 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 He has no history of alcohol use or smoking. He complains of leg pain particularly at night. His neuropathy affects both legs up to the knees approximately. He is unable to sleep through the night. He spends his day in a lift chair and catnaps throughout the day. He has a recent history of hospitalizations. He had pneumonia last year in October and was in the hospital from 02/18/2016 through 03/15/2016. He was discharged to skilled nursing and was there until April approximately of this year. In the interim he also had a hospital stay in February 2018 for flu. When he was hospitalized last year he had a complicated stay and required intubation between 02/20/2016 through 02/23/2016. He also had GI bleed and required blood transfusion. He was on gabapentin in the past. Unclear dose. No side effects reported. He has not been on Cymbalta. He cannot take nonsteroidal secondary to GI bleed.  REVIEW OF SYSTEMS: Full 14 system review of systems performed and notable only for those listed, all others are neg:  Constitutional: neg  Cardiovascular: neg Ear/Nose/Throat: neg  Skin: neg Eyes: neg Respiratory: neg Gastroitestinal: neg  Hematology/Lymphatic: neg  Endocrine: neg Musculoskeletal:neg Allergy/Immunology: neg Neurological: neg Psychiatric: neg Sleep : neg   ALLERGIES: Allergies  Allergen Reactions  . Penicillins Other (See Comments)    Unknown-reaction is unknown  . Tegretol [Carbamazepine] Itching  . Cefepime Rash  HOME MEDICATIONS: Outpatient Medications Prior to Visit  Medication Sig Dispense Refill  . acetaminophen (TYLENOL) 650 MG CR tablet Take 650 mg by mouth every 8  (eight) hours as needed for pain.    Marland Kitchen gabapentin (NEURONTIN) 600 MG tablet Take 1 tablet by mouth 3 (three) times daily.    . metFORMIN (GLUCOPHAGE) 500 MG tablet Take 1 tablet by mouth 2 (two) times daily.    . Multiple Vitamins-Minerals (CENTRUM SILVER PO) Take 1 tablet daily by mouth.    . traZODone (DESYREL) 50 MG tablet Take 50 mg by mouth at bedtime.     Marland Kitchen UNKNOWN TO PATIENT Pain med for neuropathy. Dr. Willey Blade prescribed.    . pantoprazole (PROTONIX) 40 MG tablet Take 40 mg by mouth daily.      No facility-administered medications prior to visit.     PAST MEDICAL HISTORY: Past Medical History:  Diagnosis Date  . Cervical disc disease   . Chronic constipation   . Depression   . Diabetes mellitus without complication ([place])    Borderline diabetic-diet controlled  . Duodenal ulcer 03/2016  . Empyema ([place]) 01/2016  . Hyperlipidemia   . Hypertension   . Peripheral neuropathy     PAST SURGICAL HISTORY: Past Surgical History:  Procedure Laterality Date  . COLONOSCOPY  2003   RMR: internal hemorrhoids, otherwise normal  . COLONOSCOPY  05/30/2012   Dr. Gala Romney: redundant colon with single polyp removed (too small to process), internal hemorrhoids. surveillance in 7 years    . ESOPHAGOGASTRODUODENOSCOPY (EGD) WITH PROPOFOL N/A 03/14/2016   Dr. Cristina Gong: one non-bleeding duodenal ulcer  . FLEXIBLE BRONCHOSCOPY N/A 03/07/2016   Procedure: FLEXIBLE VIDEO BRONCHOSCOPY;  Surgeon: Gaye Pollack, MD;  Location: [place];  Service: Thoracic;  Laterality: N/A;  . HERNIA REPAIR    . TONSILLECTOMY    . VIDEO ASSISTED THORACOSCOPY (VATS)/THOROCOTOMY Right 03/07/2016   Procedure: VIDEO ASSISTED THORACOSCOPY (VATS);  Surgeon: Gaye Pollack, MD;  Location: [facility] OR;  Service: Thoracic;  Laterality: Right;    FAMILY HISTORY: Family History  Problem Relation Age of Onset  . Cancer Father   . Colon cancer Paternal Uncle   . Diabetes Brother     SOCIAL HISTORY: Social History   Socioeconomic  History  . Marital status: Married    Spouse name: Not on file  . Number of children: 0  . Years of education: BA  . Highest education level: Not on file  Social Needs  . Financial resource strain: Not on file  . Food insecurity - worry: Not on file  . Food insecurity - inability: Not on file  . Transportation needs - medical: Not on file  . Transportation needs - non-medical: Not on file  Occupational History  . Occupation: [employer]    Comment: retired  Tobacco Use  . Smoking status: Former Smoker    Years: 40.00  . Smokeless tobacco: Former Systems developer    Types: Chew  Substance and Sexual Activity  . Alcohol use: No  . Drug use: No  . Sexual activity: Not on file  Other Topics Concern  . Not on file  Social History Narrative   Right handed    Lives with wife   Caffeine use: none     PHYSICAL EXAM  Vitals:   07/10/17 1302  BP: 120/60  Pulse: 92   There is no height or weight on file to calculate BMI.  Generalized: Well developed, morbidly obese male in no acute distress  Head:  normocephalic and atraumatic,. Oropharynx benign  Neck: Supple, no carotid bruits  Cardiac: Regular rate rhythm, no murmur  Musculoskeletal: No deformity   Neurological examination   Mentation: Alert oriented to time, place, history taking. Attention span and concentration appropriate. Recent and remote memory intact.  Follows all commands speech and language fluent.   Cranial nerve II-XII: Pupils were equal round reactive to light extraocular movements were full, visual field were full on confrontational test. Facial sensation and strength were normal. hearing was intact to finger rubbing bilaterally. Uvula tongue midline. head turning and shoulder shrug were normal and symmetric.Tongue protrusion into cheek strength was normal. Motor: normal bulk and tone, full strength in the BUE, 4/5 BLE,  Sensory: Decreased to all modalities to the shin level bilaterally   Coordination:  finger-nose-finger, heel-to-shin bilaterally not possible , no dysmetria, no tremor Reflexes: 1+ in the upper extremities and absent in the lower extremities , plantar responses were flexor bilaterally. Gait and Station: Seated in the wheelchair not ambulated due to safety concerns   DIAGNOSTIC DATA (LABS, IMAGING, TESTING) - I reviewed patient records, labs, notes, testing and imaging myself where available.  Lab Results  Component Value Date   WBC 4.5 06/05/2016   HGB 10.7 (L) 06/05/2016   HCT 32.9 (L) 06/05/2016   MCV 85.0 06/05/2016   PLT 161 06/05/2016      Component Value Date/Time   NA 140 06/08/2016 0617   K 3.5 06/08/2016 0617   CL 106 06/08/2016 0617   CO2 30 06/08/2016 0617   GLUCOSE 111 (H) 06/08/2016 0617   BUN 6 06/08/2016 0617   CREATININE 0.42 (L) 06/08/2016 0617   CALCIUM 7.2 (L) 06/08/2016 0617   PROT 4.6 (L) 06/05/2016 0647   ALBUMIN 1.8 (L) 06/05/2016 0647   AST 61 (H) 06/05/2016 0647   ALT 27 06/05/2016 0647   ALKPHOS 63 06/05/2016 0647   BILITOT 0.6 06/05/2016 0647   GFRNONAA >60 06/08/2016 0617   GFRAA >60 06/08/2016 0617    Lab Results  Component Value Date   HGBA1C 8.7 (H) 02/18/2016   Lab Results  Component Value Date   CWCBJSEG31 517 03/12/2016   Lab Results  Component Value Date   TSH 0.865 06/05/2016      ASSESSMENT AND PLAN   Timtohy Broski [place] a very pleasant 77 year old male with an underlying  complex medical history of arthritis, cervical spine disease, hypertension, hyperlipidemia, diabetes, hospitalization last year for pneumonia with prolonged and complicated hospital course, history of flu earlier this year, and obesity, who presents for reevaluation of his long-standing history of severe neuropathy. He had testing for this in the past.  His history and exam are in keeping with significant peripheral neuropathy. He has pain in his legs primarily. He has pain at night and difficulty sleeping at night.  He was tapered off  Lyrica at his last visit.  Continue gabapentin  600 mg 3 times daily     PLAN: Continue gabapentin at current dose times 6-am, 1to 2  PM and 9 PM Need to check blood sugars since you are on Glucophage Be careful with ambulation use walker for safe ambulation Follow-up in 4 months Dennie Bible, [facility], [facility], APRN  [facility] [address] [place], [place] [address] [phone]  I reviewed the above note and documentation by the Nurse Practitioner and agree with the history, physical exam, assessment and plan as outlined above. I was immediately available for face-to-face consultation. Star Age, MD, PhD  [facility] ([facility])

## 2017-07-10 ENCOUNTER — Encounter: Payer: Self-pay | Admitting: Nurse Practitioner

## 2017-07-10 ENCOUNTER — Ambulatory Visit: Payer: Medicare Other | Admitting: Nurse Practitioner

## 2017-07-10 DIAGNOSIS — E1142 Type 2 diabetes mellitus with diabetic polyneuropathy: Secondary | ICD-10-CM | POA: Diagnosis not present

## 2017-07-10 NOTE — Patient Instructions (Signed)
Continue gabapentin at current dose times 6-day and 1to 2  PM and 9 PM Need to check blood sugars since you are on Glucophage Be careful with ambulation use walker for safe ambulation Follow-up in 4 months

## 2017-08-04 ENCOUNTER — Encounter: Payer: Self-pay | Admitting: Orthopedic Surgery

## 2017-08-04 ENCOUNTER — Ambulatory Visit: Payer: Medicare Other | Admitting: Orthopedic Surgery

## 2017-08-04 VITALS — BP 85/58 | HR 81 | Resp 18

## 2017-08-04 DIAGNOSIS — G8929 Other chronic pain: Secondary | ICD-10-CM | POA: Diagnosis not present

## 2017-08-04 DIAGNOSIS — M25561 Pain in right knee: Secondary | ICD-10-CM

## 2017-08-04 DIAGNOSIS — M25562 Pain in left knee: Secondary | ICD-10-CM

## 2017-08-04 MED ORDER — DICLOFENAC SODIUM 75 MG PO TBEC: 75.0000 mg | DELAYED_RELEASE_TABLET | Freq: Two times a day (BID) | ORAL | 2 refills | Status: DC

## 2017-08-04 NOTE — Progress Notes (Signed)
Progress Note   Patient ID: Richard Fields, male   DOB: 10/11/1940, 77 y.o.   MRN: [id number]  Chief Complaint  Patient presents with  . Knee Pain    right     HPI 77 year old male with chronic knee pain receives injections on a 6-week rotating basis  ROS Current Meds  Medication Sig  . acetaminophen (TYLENOL) 650 MG CR tablet Take 650 mg by mouth every 8 (eight) hours as needed for pain.  Marland Kitchen gabapentin (NEURONTIN) 600 MG tablet Take 1 tablet by mouth 3 (three) times daily.  . metFORMIN (GLUCOPHAGE) 500 MG tablet Take 1 tablet by mouth 2 (two) times daily.  . Multiple Vitamins-Minerals (CENTRUM SILVER PO) Take 1 tablet daily by mouth.  . traZODone (DESYREL) 50 MG tablet Take 50 mg by mouth at bedtime.   Marland Kitchen UNKNOWN TO PATIENT Pain med for neuropathy. Dr. Willey Blade prescribed.    Allergies  Allergen Reactions  . Penicillins Other (See Comments)    Unknown-reaction is unknown  . Tegretol [Carbamazepine] Itching  . Cefepime Rash     BP (!) 85/58   Pulse 81   Resp 18   Physical Exam  Quiet knee today no erythema no synovitis Medical decision-making Encounter Diagnoses  Name Primary?  . Chronic pain of right knee Yes  . Chronic pain of left knee     Procedure note right knee injection verbal consent was obtained to inject right knee joint  Timeout was completed to confirm the site of injection  The medications used were 40 mg of Depo-Medrol and 1% lidocaine 3 cc  Anesthesia was provided by ethyl chloride and the skin was prepped with alcohol.  After cleaning the skin with alcohol a 20-gauge needle was used to inject the right knee joint. There were no complications. A sterile bandage was applied.      Arther Abbott, MD 08/04/2017 10:21 AM

## 2017-09-05 ENCOUNTER — Telehealth: Payer: Self-pay | Admitting: Nurse Practitioner

## 2017-09-05 NOTE — Telephone Encounter (Signed)
LVM for Kin, on DPR advising that NP discussed with Dr Rexene Alberts. Dr Rexene Alberts does not recommend and will not increase gabapentin due to patient's history of frequent falls and the side effects. Advised that the last refill was not by this office. Advised they may call PCP, Dr Willey Blade to discuss. Left office number for any further questions.

## 2017-09-05 NOTE — Telephone Encounter (Signed)
Patient's nephew (on [place]) requesting an increase in dosage of  gabapentin (NEURONTIN) 600 MG tablet as discussed in last office visit. Patient is in a lot of pain and not sleeping much at night. Patient uses [facility] in [place].

## 2017-09-05 NOTE — Telephone Encounter (Signed)
Discussed with Dr. Rexene Alberts. We are not prescribing the Gabapentin and she does not want to increase the dose due to side effects and pts past history of frequent falls.

## 2017-10-03 ENCOUNTER — Other Ambulatory Visit: Payer: Self-pay | Admitting: Orthopedic Surgery

## 2017-10-05 ENCOUNTER — Ambulatory Visit: Payer: Medicare Other | Admitting: Orthopaedic Surgery

## 2017-10-05 ENCOUNTER — Encounter: Payer: Self-pay | Admitting: Orthopaedic Surgery

## 2017-10-05 DIAGNOSIS — M25561 Pain in right knee: Secondary | ICD-10-CM

## 2017-10-05 DIAGNOSIS — G8929 Other chronic pain: Secondary | ICD-10-CM

## 2017-10-05 NOTE — Progress Notes (Signed)
CC:  I have pain of my right knee. I would like an injection.  The patient has chronic pain of the right knee.  There is no recent trauma.  There is no redness.  Injections in the past have helped.  The knee has no redness, has an effusion and crepitus present.  ROM of the right knee is 0-95.  Impression:  Chronic knee pain right  Return: 6 weeks  PROCEDURE NOTE:  The patient requests injections of the right knee , verbal consent was obtained.  The right knee was prepped appropriately after time out was performed.   Sterile technique was observed and injection of 1 cc of Depo-Medrol 40 mg with several cc's of plain xylocaine. Anesthesia was provided by ethyl chloride and a 20-gauge needle was used to inject the knee area. The injection was tolerated well.  A band aid dressing was applied.  The patient was advised to apply ice later today and tomorrow to the injection sight as needed.  Electronically Signed Sanjuana Kava, MD 6/6/20199:51 AM

## 2017-11-07 NOTE — Progress Notes (Addendum)
[facility]  PATIENT: Richard Fields DOB: 03/02/1941   REASON FOR VISIT: Follow-up for neuropathy  HISTORY FROM: Patient and nephew    HISTORY OF PRESENT ILLNESS:UPDATE 7/11/2019[name], 77 year old male returns for follow-up with history of peripheral neuropathy.  He also has long-standing history of hypertension hyperlipidemia diabetes obesity degenerative disc disease.  He is currently on gabapentin 800 mg 3 times a day for his neuropathy symptoms.  This was recently increased by his primary care Dr. Willey Blade.  He continues to have pain in his feet according to the nephew in good and bad days.  He sleeps in a recliner and stays in a  recliner most of the time he only walks with assistance of family members.  He claims his diabetes is in good control.  He returns for reevaluation.   UPDATE 3/11/2019[name], 77 year old male returns for follow-up with his nephew.  He has a long standing history of hypertension hyperlipidemia diabetes obesity degenerative neck disease degenerative joint disease of the knees and long-standing history of neuropathy.  When last seen he was on the multiple sedating medications to include amitriptyline and Lyrica Ativan and narcotic pain medication.  He was asked to taper off of his Lyrica per Dr. Rexene Alberts and placed on  Gabapentin.  He is currently taking 600 mg 3 times a day.  He was asked to spread it out, rather than taking the last 2 doses close together.  He is seated in a wheelchair but says he gets around the home with a walker.  He returns for reevaluation  11/7/18SAJames Fields, upon your kind request in my neurologic clinic today for initial consultation of his neuropathy of many years duration. The patient is accompanied by his wife and Nunzio Cory his daytime caretaker today. As you know, Richard Fields is a 77 year old right-handed gentleman with an underlying complex medical history of hypertension, hyperlipidemia,  type 2 diabetes, obesity, degenerative neck disease, history of duodenal ulcer, insomnia, and degenerative joint disease of the right knee, who has a long-standing history of neuropathy. I have previously seen him for gait disorder over 3 years ago as well as history of recurrent falls. 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 He is currently on Lyrica 150 mg 3 times a day, trazodone 50 mg each night, not on any narcotic pain medication. He has been practically wheelchair-bound. He lives at home with his wife. He has no children. Nunzio Cory is part of their caretaker team. He has no history of alcohol use or smoking. He complains of leg pain particularly at night. His neuropathy affects both legs up to the knees approximately. He is unable to sleep through the night. He spends his day in a lift chair and catnaps throughout the day. He has a recent history of hospitalizations. He had pneumonia last year in October and was in the hospital from 02/18/2016 through 03/15/2016. He was discharged to skilled nursing and was there until April approximately of this year. In the interim he also had a hospital stay in February 2018 for flu. When he was hospitalized last year he had a complicated stay and required intubation between 02/20/2016 through 02/23/2016. He also had GI bleed  and required blood transfusion. He was on gabapentin in the past. Unclear dose. No side effects reported. He has not been on Cymbalta. He cannot take nonsteroidal secondary to GI bleed.  REVIEW OF SYSTEMS: Full 14 system review of systems performed and notable only for those  listed, all others are neg:  Constitutional: neg  Cardiovascular: neg Ear/Nose/Throat: neg  Skin: neg Eyes: neg Respiratory: neg Gastroitestinal: neg  Hematology/Lymphatic: neg  Endocrine: neg Musculoskeletal:neg Allergy/Immunology: neg Neurological: neg Psychiatric: neg Sleep : neg   ALLERGIES: Allergies  Allergen Reactions  . Penicillins Other (See Comments)    Unknown-reaction is unknown  . Tegretol [Carbamazepine] Itching  . Cefepime Rash    HOME MEDICATIONS: Outpatient Medications Prior to Visit  Medication Sig Dispense Refill  . acetaminophen (TYLENOL) 650 MG CR tablet Take 650 mg by mouth every 8 (eight) hours as needed for pain.    Marland Kitchen diclofenac (VOLTAREN) 75 MG EC tablet TAKE 1 TABLET BY MOUTH TWICE DAILY WITH A MEAL. 56 tablet 0  . gabapentin (NEURONTIN) 800 MG tablet   6  . metFORMIN (GLUCOPHAGE) 500 MG tablet Take 1 tablet by mouth 2 (two) times daily.    . Multiple Vitamins-Minerals (CENTRUM SILVER PO) Take 1 tablet daily by mouth.    . traZODone (DESYREL) 50 MG tablet Take 50 mg by mouth at bedtime.     Marland Kitchen UNKNOWN TO PATIENT Pain med for neuropathy. Dr. Willey Blade prescribed.     No facility-administered medications prior to visit.     PAST MEDICAL HISTORY: Past Medical History:  Diagnosis Date  . Cervical disc disease   . Chronic constipation   . Depression   . Diabetes mellitus without complication ([place])    Borderline diabetic-diet controlled  . Duodenal ulcer 03/2016  . Empyema ([place]) 01/2016  . Hyperlipidemia   . Hypertension   . Peripheral neuropathy     PAST SURGICAL HISTORY: Past Surgical History:  Procedure Laterality Date  . COLONOSCOPY  2003   RMR: internal hemorrhoids, otherwise normal  . COLONOSCOPY  05/30/2012   Dr. Gala Romney: redundant colon with single polyp removed (too small to process), internal hemorrhoids. surveillance in 7 years    . ESOPHAGOGASTRODUODENOSCOPY (EGD) WITH PROPOFOL N/A 03/14/2016   Dr. Cristina Gong: one non-bleeding  duodenal ulcer  . FLEXIBLE BRONCHOSCOPY N/A 03/07/2016   Procedure: FLEXIBLE VIDEO BRONCHOSCOPY;  Surgeon: Gaye Pollack, MD;  Location: [facility];  Service: Thoracic;  Laterality: N/A;  . HERNIA REPAIR    . TONSILLECTOMY    . VIDEO ASSISTED THORACOSCOPY (VATS)/THOROCOTOMY Right 03/07/2016   Procedure: VIDEO ASSISTED THORACOSCOPY (VATS);  Surgeon: Gaye Pollack, MD;  Location: [facility] OR;  Service: Thoracic;  Laterality: Right;    FAMILY HISTORY: Family History  Problem Relation Age of Onset  . Cancer Father   . Colon cancer Paternal Uncle   . Diabetes Brother     SOCIAL HISTORY: Social History   Socioeconomic History  . Marital status: Married    Spouse name: Not on file  . Number of children: 0  . Years of education: BA  . Highest education level: Not on file  Occupational History  . Occupation: [employer]    Comment: retired  Scientific laboratory technician  . Financial resource strain: Not on file  . Food insecurity:    Worry: Not on file    Inability: Not on file  . Transportation needs:    Medical: Not on file    Non-medical: Not on file  Tobacco Use  . Smoking  status: Former Smoker    Years: 40.00  . Smokeless tobacco: Former Systems developer    Types: Chew  Substance and Sexual Activity  . Alcohol use: No  . Drug use: No  . Sexual activity: Not on file  Lifestyle  . Physical activity:    Days per week: Not on file    Minutes per session: Not on file  . Stress: Not on file  Relationships  . Social connections:    Talks on phone: Not on file    Gets together: Not on file    Attends religious service: Not on file    Active member of club or organization: Not on file    Attends meetings of clubs or organizations: Not on file    Relationship status: Not on file  . Intimate partner violence:    Fear of current or ex partner: Not on file    Emotionally abused: Not on file    Physically abused: Not on file    Forced sexual activity: Not on file  Other Topics Concern  . Not  on file  Social History Narrative   Right handed    Lives with wife   Caffeine use: none   PHYSICAL EXAM  Vitals:   11/09/17 1259  BP: 124/72  Pulse: 89  Weight: 289 lb 9.6 oz (131.4 kg)  Height: 5\' 11"  (1.803 m)   Body mass index is 40.39 kg/m.  Generalized: Well developed, morbidly obese male in no acute distress  Head: normocephalic and atraumatic,. Oropharynx benign  Neck: Supple,  Musculoskeletal: No deformity    Neurological examination   Mentation: Alert oriented to time, place, history taking. Attention span and concentration appropriate. Recent and remote memory intact.  Follows all commands speech and language fluent.   Cranial nerve II-XII: Pupils were equal round reactive to light extraocular movements were full, visual field were full on confrontational test. Facial sensation and strength were normal. hearing was intact to finger rubbing bilaterally. Uvula tongue midline. head turning and shoulder shrug were normal and symmetric.Tongue protrusion into cheek strength was normal. Motor: normal bulk and tone, full strength in the BUE, 4/5 BLE,  Sensory: Decreased to all modalities to the shin level bilaterally   Coordination: finger-nose-finger, heel-to-shin bilaterally not possible , no dysmetria, no tremor Reflexes: 1+ in the upper extremities and absent in the lower extremities , plantar responses were flexor bilaterally. Gait and Station: Seated in the wheelchair not ambulated due to safety concerns   DIAGNOSTIC DATA (LABS, IMAGING, TESTING) - I reviewed patient records, labs, notes, testing and imaging myself where available.  Lab Results  Component Value Date   WBC 4.5 06/05/2016   HGB 10.7 (L) 06/05/2016   HCT 32.9 (L) 06/05/2016   MCV 85.0 06/05/2016   PLT 161 06/05/2016      Component Value Date/Time   NA 140 06/08/2016 0617   K 3.5 06/08/2016 0617   CL 106 06/08/2016 0617   CO2 30 06/08/2016 0617   GLUCOSE 111 (H) 06/08/2016 0617   BUN 6  06/08/2016 0617   CREATININE 0.42 (L) 06/08/2016 0617   CALCIUM 7.2 (L) 06/08/2016 0617   PROT 4.6 (L) 06/05/2016 0647   ALBUMIN 1.8 (L) 06/05/2016 0647   AST 61 (H) 06/05/2016 0647   ALT 27 06/05/2016 0647   ALKPHOS 63 06/05/2016 0647   BILITOT 0.6 06/05/2016 0647   GFRNONAA >60 06/08/2016 0617   GFRAA >60 06/08/2016 0617    Lab Results  Component Value Date   HGBA1C  8.7 (H) 02/18/2016   Lab Results  Component Value Date   GFQMKJIZ12 811 03/12/2016   Lab Results  Component Value Date   TSH 0.865 06/05/2016      ASSESSMENT AND PLAN   Schawn Byas [place] a very pleasant 77 year old male with an underlying  complex medical history of arthritis, cervical spine disease, hypertension, hyperlipidemia, diabetes, hospitalization last year for pneumonia with prolonged and complicated hospital course, history of flu earlier this year, and obesity, who presents for reevaluation of his long-standing history of severe neuropathy. He had testing for this in the past.  His history and exam are in keeping with significant peripheral neuropathy. He has pain in his legs primarily. He has pain at night and difficulty sleeping at night.  He was tapered off Lyrica at his last visit.  Continue gabapentin  800 mg 3 times daily . Will add low dose Cymbalta.  He also has evidence of peripheral disease in the lower extremities on exam.      PLAN: Continue gabapentin 800mg  three times daily Keep blood sugars in good control Be careful with ambulation use walker for safe ambulation Add Cymbalta 20mg  po every morning Follow-up in 6 months Dennie Bible, [facility], [facility], APRN  [facility] [address], [place], [place] [address] [phone]  I reviewed the above note and documentation by the Nurse Practitioner and agree with the history, physical exam, assessment and plan as outlined above. I was immediately available for face-to-face consultation. Star Age, MD,  PhD [facility])

## 2017-11-09 ENCOUNTER — Ambulatory Visit: Payer: Medicare Other | Admitting: Nurse Practitioner

## 2017-11-09 ENCOUNTER — Encounter: Payer: Self-pay | Admitting: Nurse Practitioner

## 2017-11-09 VITALS — BP 124/72 | HR 89 | Ht 71.0 in | Wt 289.6 lb

## 2017-11-09 DIAGNOSIS — E1142 Type 2 diabetes mellitus with diabetic polyneuropathy: Secondary | ICD-10-CM | POA: Diagnosis not present

## 2017-11-09 MED ORDER — DULOXETINE HCL 20 MG PO CPEP: 20.0000 mg | ORAL_CAPSULE | Freq: Every day | ORAL | 6 refills | Status: DC

## 2017-11-09 NOTE — Patient Instructions (Addendum)
Continue gabapentin 800mg  three times daily Keep blood sugars in good control Be careful with ambulation use walker for safe ambulation Add Cymbalta 20mg  po every morning Follow-up in 6 months Duloxetine delayed-release capsules What is this medicine? DULOXETINE (doo LOX e teen) is used to treat depression, anxiety, and different types of chronic pain. This medicine may be used for other purposes; ask your health care provider or pharmacist if you have questions. COMMON BRAND NAME(S): Cymbalta, [place] What should I tell my health care provider before I take this medicine? They need to know if you have any of these conditions: -bipolar disorder or a family history of bipolar disorder -glaucoma -kidney disease -liver disease -suicidal thoughts or a previous suicide attempt -taken medicines called MAOIs like Carbex, Eldepryl, Marplan, Nardil, and Parnate within 14 days -an unusual reaction to duloxetine, other medicines, foods, dyes, or preservatives -pregnant or trying to get pregnant -breast-feeding How should I use this medicine? Take this medicine by mouth with a glass of water. Follow the directions on the prescription label. Do not cut, crush or chew this medicine. You can take this medicine with or without food. Take your medicine at regular intervals. Do not take your medicine more often than directed. Do not stop taking this medicine suddenly except upon the advice of your doctor. Stopping this medicine too quickly may cause serious side effects or your condition may worsen. A special MedGuide will be given to you by the pharmacist with each prescription and refill. Be sure to read this information carefully each time. Talk to your pediatrician regarding the use of this medicine in children. While this drug may be prescribed for children as young as 54 years of age for selected conditions, precautions do apply. Overdosage: If you think you have taken too much of this medicine contact a  poison control center or emergency room at once. NOTE: This medicine is only for you. Do not share this medicine with others. What if I miss a dose? If you miss a dose, take it as soon as you can. If it is almost time for your next dose, take only that dose. Do not take double or extra doses. What may interact with this medicine? Do not take this medicine with any of the following medications: -desvenlafaxine -levomilnacipran -linezolid -MAOIs like Carbex, Eldepryl, Marplan, Nardil, and Parnate -methylene blue (injected into a vein) -milnacipran -thioridazine -venlafaxine This medicine may also interact with the following medications: -alcohol -amphetamines -aspirin and aspirin-like medicines -certain antibiotics like ciprofloxacin and enoxacin -certain medicines for blood pressure, heart disease, irregular heart beat -certain medicines for depression, anxiety, or psychotic disturbances -certain medicines for migraine headache like almotriptan, eletriptan, frovatriptan, naratriptan, rizatriptan, sumatriptan, zolmitriptan -certain medicines that treat or prevent blood clots like warfarin, enoxaparin, and dalteparin -cimetidine -fentanyl -lithium -NSAIDS, medicines for pain and inflammation, like ibuprofen or naproxen -phentermine -procarbazine -rasagiline -sibutramine -St. John's wort -theophylline -tramadol -tryptophan This list may not describe all possible interactions. Give your health care provider a list of all the medicines, herbs, non-prescription drugs, or dietary supplements you use. Also tell them if you smoke, drink alcohol, or use illegal drugs. Some items may interact with your medicine. What should I watch for while using this medicine? Tell your doctor if your symptoms do not get better or if they get worse. Visit your doctor or health care professional for regular checks on your progress. Because it may take several weeks to see the full effects of this medicine,  it is important to  continue your treatment as prescribed by your doctor. Patients and their families should watch out for new or worsening thoughts of suicide or depression. Also watch out for sudden changes in feelings such as feeling anxious, agitated, panicky, irritable, hostile, aggressive, impulsive, severely restless, overly excited and hyperactive, or not being able to sleep. If this happens, especially at the beginning of treatment or after a change in dose, call your health care professional. Dennis Bast may get drowsy or dizzy. Do not drive, use machinery, or do anything that needs mental alertness until you know how this medicine affects you. Do not stand or sit up quickly, especially if you are an older patient. This reduces the risk of dizzy or fainting spells. Alcohol may interfere with the effect of this medicine. Avoid alcoholic drinks. This medicine can cause an increase in blood pressure. This medicine can also cause a sudden drop in your blood pressure, which may make you feel faint and increase the chance of a fall. These effects are most common when you first start the medicine or when the dose is increased, or during use of other medicines that can cause a sudden drop in blood pressure. Check with your doctor for instructions on monitoring your blood pressure while taking this medicine. Your mouth may get dry. Chewing sugarless gum or sucking hard candy, and drinking plenty of water may help. Contact your doctor if the problem does not go away or is severe. What side effects may I notice from receiving this medicine? Side effects that you should report to your doctor or health care professional as soon as possible: -allergic reactions like skin rash, itching or hives, swelling of the face, lips, or tongue -anxious -breathing problems -confusion -changes in vision -chest pain -confusion -elevated mood, decreased need for sleep, racing thoughts, impulsive behavior -eye pain -fast,  irregular heartbeat -feeling faint or lightheaded, falls -feeling agitated, angry, or irritable -hallucination, loss of contact with reality -high blood pressure -loss of balance or coordination -palpitations -redness, blistering, peeling or loosening of the skin, including inside the mouth -restlessness, pacing, inability to keep still -seizures -stiff muscles -suicidal thoughts or other mood changes -trouble passing urine or change in the amount of urine -trouble sleeping -unusual bleeding or bruising -unusually weak or tired -vomiting -yellowing of the eyes or skin Side effects that usually do not require medical attention (report to your doctor or health care professional if they continue or are bothersome): -change in sex drive or performance -change in appetite or weight -constipation -dizziness -dry mouth -headache -increased sweating -nausea -tired This list may not describe all possible side effects. Call your doctor for medical advice about side effects. You may report side effects to FDA at 1-800-FDA-1088. Where should I keep my medicine? Keep out of the reach of children. Store at room temperature between 20 and 25 degrees C (68 to 77 degrees F). Throw away any unused medicine after the expiration date. NOTE: This sheet is a summary. It may not cover all possible information. If you have questions about this medicine, talk to your doctor, pharmacist, or health care provider.  2018 Elsevier/Gold Standard (2015-09-17 18:16:03)

## 2017-11-23 ENCOUNTER — Encounter: Payer: Self-pay | Admitting: Orthopaedic Surgery

## 2017-11-23 ENCOUNTER — Ambulatory Visit: Payer: Medicare Other | Admitting: Orthopaedic Surgery

## 2017-11-23 DIAGNOSIS — M25561 Pain in right knee: Secondary | ICD-10-CM | POA: Diagnosis not present

## 2017-11-23 DIAGNOSIS — G8929 Other chronic pain: Secondary | ICD-10-CM

## 2017-11-23 DIAGNOSIS — Z6841 Body Mass Index (BMI) 40.0 and over, adult: Secondary | ICD-10-CM

## 2017-11-23 NOTE — Progress Notes (Signed)
CC:  I have pain of my right knee. I would like an injection.  The patient has chronic pain of the right knee.  There is no recent trauma.  There is no redness.  Injections in the past have helped.  The knee has no redness, has an effusion and crepitus present.  ROM of the right knee is 0-105.  Impression:  Chronic knee pain right  Return: 2 months  PROCEDURE NOTE:  The patient requests injections of the right knee , verbal consent was obtained.  The right knee was prepped appropriately after time out was performed.   Sterile technique was observed and injection of 1 cc of Depo-Medrol 40 mg with several cc's of plain xylocaine. Anesthesia was provided by ethyl chloride and a 20-gauge needle was used to inject the knee area. The injection was tolerated well.  A band aid dressing was applied.  The patient was advised to apply ice later today and tomorrow to the injection sight as needed.  The patient meets the AMA guidelines for Morbid (severe) obesity with a BMI > 40.0 and I have recommended weight loss.  Electronically Signed Sanjuana Kava, MD 7/25/201910:32 AM

## 2017-11-28 ENCOUNTER — Other Ambulatory Visit: Payer: Self-pay | Admitting: Orthopedic Surgery

## 2017-12-04 ENCOUNTER — Encounter: Payer: Self-pay | Admitting: Gastroenterology

## 2017-12-04 ENCOUNTER — Ambulatory Visit: Payer: Medicare Other | Admitting: Gastroenterology

## 2018-01-25 ENCOUNTER — Ambulatory Visit: Payer: Medicare Other | Admitting: Orthopaedic Surgery

## 2018-05-12 ENCOUNTER — Other Ambulatory Visit: Payer: Self-pay | Admitting: Nurse Practitioner

## 2018-05-28 NOTE — Progress Notes (Addendum)
[facility]  PATIENT: Richard Fields DOB: May 17, 1940   REASON FOR VISIT: Follow-up for neuropathy  HISTORY FROM: Patient and niece    HISTORY OF PRESENT ILLNESS:UPDATE 1/28/2020[name], 78 year old male returns for follow-up with history of peripheral neuropathy he also has longstanding diabetes hyperlipidemia hypertension obesity and degenerative disc disease.  Cymbalta was added at his last visit with good results.  He feels his symptoms are much more stable.  He is also on Neurontin 800 mg 3 times daily.  He stays in a  recliner most of the day.  He does ambulate with a walker in the home.  He is seated in a wheelchair today.  His diabetes remains up and down and Trulicity was recently added.  He returns for reevaluation.  He has not had any falls   UPDATE 7/11/2019[name], 78 year old male returns for follow-up with history of peripheral neuropathy.  He also has long-standing history of hypertension hyperlipidemia diabetes obesity degenerative disc disease.  He is currently on gabapentin 800 mg 3 times a day for his neuropathy symptoms.  This was recently increased by his primary care Dr. Willey Blade.  He continues to have pain in his feet according to the nephew in good and bad days.  He sleeps in a recliner and stays in a  recliner most of the time he only walks with assistance of family members.  He claims his diabetes is in good control.  He returns for reevaluation.   UPDATE 3/11/2019[name], 78 year old male returns for follow-up with his nephew.  He has a long standing history of hypertension hyperlipidemia diabetes obesity degenerative neck disease degenerative joint disease of the knees and long-standing history of neuropathy.  When last seen he was on the multiple sedating medications to include amitriptyline and Lyrica Ativan and narcotic pain medication.  He was asked to taper off of his Lyrica per Dr. Rexene Alberts and placed on   Gabapentin.  He is currently taking 600 mg 3 times a day.  He was asked to spread it out, rather than taking the last 2 doses close together.  He is seated in a wheelchair but says he gets around the home with a walker.  He returns for reevaluation  11/7/18SAJames Fields, upon your kind request in my neurologic clinic today for initial consultation of his neuropathy of many years duration. The patient is accompanied by his wife and Richard Fields his daytime caretaker today. As you know, Mr. Ensminger is a 78 year old right-handed gentleman with an underlying complex medical history of hypertension, hyperlipidemia, type 2 diabetes, obesity, degenerative neck disease, history of duodenal ulcer, insomnia, and degenerative joint disease of the right knee, who has a long-standing history of neuropathy. I have previously seen him for gait disorder over 3 years ago as well as history of recurrent falls. 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 He is currently on Lyrica 150 mg 3 times a day, trazodone 50 mg each night, not on any narcotic pain medication. He has been practically wheelchair-bound. He lives at home with his wife. He has no children. 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 He has no history of alcohol use or smoking. He complains  of leg pain particularly at night. His neuropathy affects both legs up to the knees approximately. He is unable to sleep through the night. He spends his day in a lift chair and catnaps throughout the day. He has a recent history of hospitalizations. He had pneumonia last year in October and was in the  hospital from 02/18/2016 through 03/15/2016. He was discharged to skilled nursing and was there until April approximately of this year. In the interim he also had a hospital stay in February 2018 for flu. When he was hospitalized last year he had a complicated stay and required intubation between 02/20/2016 through 02/23/2016. He also had GI bleed and required blood transfusion. He was on gabapentin in the past. Unclear dose. No side effects reported. He has not been on Cymbalta. He cannot take nonsteroidal secondary to GI bleed.  REVIEW OF SYSTEMS: Full 14 system review of systems performed and notable only for those listed, all others are neg:  Constitutional: neg  Cardiovascular: neg Ear/Nose/Throat: neg  Skin: neg Eyes: neg Respiratory: neg Gastroitestinal: neg  Hematology/Lymphatic: neg  Endocrine: neg Musculoskeletal:neg Allergy/Immunology: neg Neurological: neg Psychiatric: neg Sleep : neg   ALLERGIES: Allergies  Allergen Reactions  . Penicillins Other (See Comments)    Unknown-reaction is unknown  . Tegretol [Carbamazepine] Itching  . Cefepime Rash    HOME MEDICATIONS: Outpatient Medications Prior to Visit  Medication Sig Dispense Refill  . acetaminophen (TYLENOL) 650 MG CR tablet Take 650 mg by mouth every 8 (eight) hours as needed for pain.    . DULoxetine (CYMBALTA) 20 MG capsule TAKE (1) CAPSULE BY MOUTH ONCE DAILY. 30 capsule 5  . gabapentin (NEURONTIN) 800 MG tablet   6  . metFORMIN (GLUCOPHAGE) 500 MG tablet Take 1 tablet by mouth 2 (two) times daily.    . Multiple Vitamins-Minerals (CENTRUM SILVER PO) Take 1 tablet daily by mouth.    . traZODone (DESYREL) 50 MG tablet Take 50 mg by mouth at bedtime.     . TRULICITY 2.97 LG/9.2JJ SOPN every 7 (seven) days.    . diclofenac (VOLTAREN) 75 MG EC tablet TAKE 1 TABLET BY MOUTH TWICE DAILY WITH A MEAL. (Patient not taking: Reported on 05/29/2018) 56 tablet 4   No facility-administered medications prior to visit.      PAST MEDICAL HISTORY: Past Medical History:  Diagnosis Date  . Cervical disc disease   . Chronic constipation   . Depression   . Diabetes mellitus without complication ([place])    Borderline diabetic-diet controlled  . Duodenal ulcer 03/2016  . Empyema ([place]) 01/2016  . Hyperlipidemia   . Hypertension   . Peripheral neuropathy     PAST SURGICAL HISTORY: Past Surgical History:  Procedure Laterality Date  . COLONOSCOPY  2003   RMR: internal hemorrhoids, otherwise normal  . COLONOSCOPY  05/30/2012   Dr. Gala Romney: redundant colon with single polyp removed (too small to process), internal hemorrhoids. surveillance in 7 years    . ESOPHAGOGASTRODUODENOSCOPY (EGD) WITH PROPOFOL N/A 03/14/2016   Dr. Cristina Gong: one non-bleeding duodenal ulcer  . FLEXIBLE BRONCHOSCOPY N/A 03/07/2016   Procedure: FLEXIBLE VIDEO BRONCHOSCOPY;  Surgeon: Gaye Pollack, MD;  Location: [place];  Service: Thoracic;  Laterality: N/A;  . HERNIA REPAIR    . SKIN LESION EXCISION     moles removed  . TONSILLECTOMY    . VIDEO ASSISTED THORACOSCOPY (VATS)/THOROCOTOMY Right 03/07/2016   Procedure: VIDEO ASSISTED THORACOSCOPY (VATS);  Surgeon: Gaye Pollack, MD;  Location: [facility];  Service: Thoracic;  Laterality: Right;    FAMILY HISTORY: Family History  Problem Relation Age of Onset  . Cancer Father   . Colon cancer Paternal Uncle   . Diabetes Brother     SOCIAL HISTORY: Social History   Socioeconomic History  . Marital status: Married    Spouse name: Not on file  . Number of children: 0  . Years of education: BA  . Highest education level: Not on file  Occupational History  . Occupation: [employer]    Comment: retired  Scientific laboratory technician  . Financial resource strain: Not on file  . Food insecurity:    Worry: Not on file    Inability: Not on file  . Transportation needs:    Medical: Not on file    Non-medical: Not on file  Tobacco Use  . Smoking status: Former Smoker    Years: 40.00  .  Smokeless tobacco: Former Systems developer    Types: Chew  Substance and Sexual Activity  . Alcohol use: No  . Drug use: No  . Sexual activity: Not on file  Lifestyle  . Physical activity:    Days per week: Not on file    Minutes per session: Not on file  . Stress: Not on file  Relationships  . Social connections:    Talks on phone: Not on file    Gets together: Not on file    Attends religious service: Not on file    Active member of club or organization: Not on file    Attends meetings of clubs or organizations: Not on file    Relationship status: Not on file  . Intimate partner violence:    Fear of current or ex partner: Not on file    Emotionally abused: Not on file    Physically abused: Not on file    Forced sexual activity: Not on file  Other Topics Concern  . Not on file  Social History Narrative   Right handed    Lives with wife   Caffeine use: none   PHYSICAL EXAM  Vitals:   05/29/18 1232  BP: 137/85  Pulse: 91  Weight: 282 lb (127.9 kg)  Height: 5\' 11"  (1.803 m)   Body mass index is 39.33 kg/m.  Generalized: Well developed, morbidly obese male in no acute distress  Head: normocephalic and atraumatic,. Oropharynx benign  Neck: Supple,  Musculoskeletal: No deformity    Neurological examination   Mentation: Alert oriented to time, place, history taking. Attention span and concentration appropriate. Recent and remote memory intact.  Follows all commands speech and language fluent.   Cranial nerve II-XII: Pupils were equal round reactive to light extraocular movements were full, visual field were full on confrontational test. Facial sensation and strength were normal. hearing was intact to finger rubbing bilaterally. Uvula tongue midline. head turning and shoulder shrug were normal and symmetric.Tongue protrusion into cheek strength was normal. Motor: normal bulk and tone, full strength in the BUE, 4/5 BLE, distally Sensory: Decreased to all modalities to the shin  level bilaterally   Coordination: finger-nose-finger, heel-to-shin bilaterally not possible , no dysmetria, no tremor Reflexes: 1+ in the upper extremities and absent in the lower extremities , plantar responses were flexor bilaterally. Gait and Station: Seated in the wheelchair not ambulated due to safety concerns   DIAGNOSTIC DATA (LABS, IMAGING, TESTING) - I reviewed patient records, labs, notes, testing and imaging myself where available.  Lab Results  Component Value Date   WBC 4.5 06/05/2016  HGB 10.7 (L) 06/05/2016   HCT 32.9 (L) 06/05/2016   MCV 85.0 06/05/2016   PLT 161 06/05/2016      Component Value Date/Time   NA 140 06/08/2016 0617   K 3.5 06/08/2016 0617   CL 106 06/08/2016 0617   CO2 30 06/08/2016 0617   GLUCOSE 111 (H) 06/08/2016 0617   BUN 6 06/08/2016 0617   CREATININE 0.42 (L) 06/08/2016 0617   CALCIUM 7.2 (L) 06/08/2016 0617   PROT 4.6 (L) 06/05/2016 0647   ALBUMIN 1.8 (L) 06/05/2016 0647   AST 61 (H) 06/05/2016 0647   ALT 27 06/05/2016 0647   ALKPHOS 63 06/05/2016 0647   BILITOT 0.6 06/05/2016 0647   GFRNONAA >60 06/08/2016 0617   GFRAA >60 06/08/2016 0617    Lab Results  Component Value Date   HGBA1C 8.7 (H) 02/18/2016   Lab Results  Component Value Date   FYBOFBPZ02 585 03/12/2016   Lab Results  Component Value Date   TSH 0.865 06/05/2016      ASSESSMENT AND PLAN   Gates Jividen [place] a very pleasant 78 year old male with an underlying  complex medical history of arthritis, cervical spine disease, hypertension, hyperlipidemia, diabetes, hospitalization last year for pneumonia with prolonged and complicated hospital course, history of flu earlier this year, and obesity, who presents for reevaluation of his long-standing history of severe neuropathy. He had testing for this in the past.  His history and exam are in keeping with significant peripheral neuropathy. He has pain in his legs primarily. He has pain at night and difficulty  sleeping at night.  He was tapered off Lyrica at his last visit.  Continue gabapentin  800 mg 3 times daily . Low dose Cymbalta was added with good results.  He also has evidence of peripheral disease in the lower extremities on exam.      PLAN: Continue gabapentin 800mg  three times daily Keep blood sugars in good control Be careful with ambulation use walker for safe ambulation at risk for falls Continue Cymbalta 20mg  po every morning does not need refills Follow-up in 8 months Dennie Bible, [facility], [facility], APRN  [facility] [address] [place], [place] [address] [phone]  I reviewed the above note and documentation by the Nurse Practitioner and agree with the history, physical exam, assessment and plan as outlined above. I was immediately available for face-to-face consultation. Star Age, MD, PhD [facility])

## 2018-05-29 ENCOUNTER — Encounter: Payer: Self-pay | Admitting: Nurse Practitioner

## 2018-05-29 ENCOUNTER — Ambulatory Visit: Payer: Medicare Other | Admitting: Nurse Practitioner

## 2018-05-29 VITALS — BP 137/85 | HR 91 | Ht 71.0 in | Wt 282.0 lb

## 2018-05-29 DIAGNOSIS — E1142 Type 2 diabetes mellitus with diabetic polyneuropathy: Secondary | ICD-10-CM

## 2018-05-29 DIAGNOSIS — R269 Unspecified abnormalities of gait and mobility: Secondary | ICD-10-CM

## 2018-05-29 NOTE — Patient Instructions (Signed)
Continue gabapentin 800mg  three times daily Keep blood sugars in good control Be careful with ambulation use walker for safe ambulation at risk for falls Continue Cymbalta 20mg  po every morning Follow-up in 8 months

## 2018-09-25 ENCOUNTER — Other Ambulatory Visit: Payer: Self-pay | Admitting: Neurology

## 2018-11-19 ENCOUNTER — Other Ambulatory Visit: Payer: Self-pay | Admitting: Neurology

## 2019-01-15 ENCOUNTER — Other Ambulatory Visit: Payer: Self-pay | Admitting: Neurology

## 2019-01-28 ENCOUNTER — Ambulatory Visit (INDEPENDENT_AMBULATORY_CARE_PROVIDER_SITE_OTHER): Payer: Medicare Other | Admitting: Neurology

## 2019-01-28 ENCOUNTER — Encounter: Payer: Self-pay | Admitting: Neurology

## 2019-01-28 ENCOUNTER — Other Ambulatory Visit: Payer: Self-pay

## 2019-01-28 VITALS — BP 120/69 | HR 79

## 2019-01-28 DIAGNOSIS — E1140 Type 2 diabetes mellitus with diabetic neuropathy, unspecified: Secondary | ICD-10-CM | POA: Diagnosis not present

## 2019-01-28 DIAGNOSIS — R600 Localized edema: Secondary | ICD-10-CM | POA: Diagnosis not present

## 2019-01-28 NOTE — Patient Instructions (Signed)
We will continue with the Cymbalta 20 mg daily.  Since you are sleepy during the day I would be reluctant to increase it at this time.  We can revisit in 6 months.  Please follow-up with the nurse practitioner, either Amy or Megan.  You can continue with the gabapentin 800 mg 3 times daily as per Dr. Willey Blade.  Please also talk to him about your leg swelling.  You may benefit from seeing a vascular and vein specialist.  Please address this at your next appointment which is coming up in 3 weeks.

## 2019-01-28 NOTE — Progress Notes (Signed)
Subjective:    Patient ID: Richard Fields is a 78 y.o. male.  HPI     Interim history:  Richard Fields is an 78 year old right-handed gentleman with an underlying complex medical history of hypertension, hyperlipidemia, type 2 diabetes, obesity, degenerative neck disease, history of duodenal ulcer, insomnia, and degenerative joint disease of the right knee, who presents for follow-up consultation of his neuropathy.  The patient is accompanied by his niece, Richard Fields, today. I last saw him on 03/08/2017, at which time he was on Lyrica.  He was seen in the interim by Richard Fields, nurse practitioner on 07/10/2017, 11/09/2017, and most recently on 05/29/2018.  He was eventually tapered off of Lyrica.  He has been on gabapentin 800 mg 3 times daily and Cymbalta 20 mg daily.  Today, 01/28/2019: He reports feeling stable.  He still has some intermittent breakthrough pain in his legs.  He has noted swelling of his lower extremities for the past year or so.  He has an appointment with his primary care physician coming up next month.  His niece reports that he stays in his room primarily.  He has a recliner.  He sleeps in his recliner.  He also appears to be sleepy during the day.  It is unclear why he sleeps in the recliner.  He does come to the den for meals and to watch TV.  He uses a walker with no wheels.  He hydrates with water and diluted ginger ale.  As far she recalls his A1c has been below 7 last time the checked it, he has had better numbers since starting Trulicity several months ago. He takes Cymbalta 20 mg once daily in the morning, 800 mg of gabapentin 3 times daily per PCP.  He gets his medicines packaged for the week by his pharmacy.  He has an aide overnight from 7 PM to 7 AM daily.  Richard Fields checks on the patient and his wife daily and also prepares meals.  The patient's allergies, current medications, family history, past medical history, past social history, past surgical history and problem list  were reviewed and updated as appropriate.   Previously:   03/08/2017: (He) has a long-standing history of neuropathy. I have previously seen him for gait disorder over 3 years ago as well as history of recurrent falls. 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 He is currently on Lyrica 150 mg 3 times a day, trazodone 50 mg each night, not on any narcotic pain medication. He has been practically wheelchair-bound. He lives at home with his wife. He has no children. 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 He has no history of alcohol use or smoking. He complains of leg pain particularly at night. His neuropathy affects both legs up to the knees approximately. He is unable to sleep through the night. He spends his day in a lift chair and catnaps throughout the day. He has a recent history of hospitalizations. He had pneumonia last year in October and was in the hospital from 02/18/2016 through 03/15/2016. He was discharged to skilled nursing and was there until April approximately of this year. In the interim he also had a hospital stay in February 2018  for flu. When he was hospitalized last year he had a complicated stay and required intubation between 02/20/2016 through 02/23/2016. He also had GI bleed and required blood transfusion. He was on gabapentin in the past. Unclear dose. No side effects reported. He has not been on Cymbalta. He cannot take nonsteroidal secondary to GI bleed.    09/10/2013: Richard Fields is a 78 year old right-handed gentleman with an underlying medical history of obesity, arthritis, affecting both knees,  cervical disc disease for which he is on as needed use of hydrocodone, diabetes (not on meds, last A1c of 6.6), hyperlipidemia, hypertension, history of neuropathy for which he is on Lyrica 150 mg twice daily and amitriptyline 100 mg each night, and insomnia, for which he is on Ativan 2 mg each night, who reports balance issues and recurrent falls for years, worse in the last 2-3 months. He had a brain MRI without contrast on 03/12/2013: Moderate atrophy with mild to moderate chronic microvascular ischemic change. No acute stroke or visible hemorrhage. 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 No significant compression of the underlying brain. In addition, personally reviewed the images through the PACS system. He does not have a history of heavy alcohol use and quit smoking in the 80s. He has had concussions before, playing football when he was young.  He has been using a walker for years, but fell recently about 2 weeks ago and sustained a fracture of his right 5th metacarpal. He has not had a syncope or vertigo symptoms. He is currently in a WC. He has a cast on his R hand/wrist. He does not snore and has no apneas per wife.  He was seen for his neuropathy in our office years ago, presumably by Dr. Brett Fairy.   His Past Medical History Is Significant For: Past Medical History:  Diagnosis Date  . Cervical disc disease   . Chronic constipation   . Depression   . Diabetes mellitus without complication ([place])    Borderline diabetic-diet controlled  . Duodenal ulcer 03/2016  . Empyema ([place]) 01/2016  . Hyperlipidemia   . Hypertension   . Peripheral neuropathy     His Past Surgical History Is Significant For: Past Surgical History:  Procedure Laterality Date  . COLONOSCOPY  2003   RMR: internal hemorrhoids, otherwise normal  . COLONOSCOPY  05/30/2012   Dr. Gala Romney: redundant colon with single polyp removed (too small to  process), internal hemorrhoids. surveillance in 7 years    . ESOPHAGOGASTRODUODENOSCOPY (EGD) WITH PROPOFOL N/A 03/14/2016   Dr. Cristina Gong: one non-bleeding duodenal ulcer  . FLEXIBLE BRONCHOSCOPY N/A 03/07/2016   Procedure: FLEXIBLE VIDEO BRONCHOSCOPY;  Surgeon: Gaye Pollack, MD;  Location: [facility];  Service: Thoracic;  Laterality: N/A;  . HERNIA REPAIR    . SKIN LESION EXCISION     moles removed  . TONSILLECTOMY    . VIDEO ASSISTED THORACOSCOPY (VATS)/THOROCOTOMY Right 03/07/2016   Procedure: VIDEO ASSISTED THORACOSCOPY (VATS);  Surgeon: Gaye Pollack, MD;  Location: [facility] OR;  Service: Thoracic;  Laterality: Right;    His Family History Is Significant For: Family History  Problem Relation Age of Onset  . Cancer Father   . Colon cancer Paternal Uncle   . Diabetes Brother     His Social History Is Significant For: Social History   Socioeconomic History  . Marital status: Married    Spouse name: Not on file  . Number of children: 0  . Years of education:  BA  . Highest education level: Not on file  Occupational History  . Occupation:  [employer]    Comment: retired  Scientific laboratory technician  . Financial resource strain: Not on file  . Food insecurity    Worry: Not on file    Inability: Not on file  . Transportation needs    Medical: Not on file    Non-medical: Not on file  Tobacco Use  . Smoking status: Former Smoker    Years: 40.00  . Smokeless tobacco: Former Systems developer    Types: Chew  Substance and Sexual Activity  . Alcohol use: No  . Drug use: No  . Sexual activity: Not on file  Lifestyle  . Physical activity    Days per week: Not on file    Minutes per session: Not on file  . Stress: Not on file  Relationships  . Social Herbalist on phone: Not on file    Gets together: Not on file    Attends religious service: Not on file    Active member of club or organization: Not on file    Attends meetings of clubs or organizations: Not on file     Relationship status: Not on file  Other Topics Concern  . Not on file  Social History Narrative   Right handed    Lives with wife   Caffeine use: none    His Allergies Are:  Allergies  Allergen Reactions  . Penicillins Other (See Comments)    Unknown-reaction is unknown  . Tegretol [Carbamazepine] Itching  . Cefepime Rash  :   His Current Medications Are:  Outpatient Encounter Medications as of 01/28/2019  Medication Sig  . acetaminophen (TYLENOL) 650 MG CR tablet Take 650 mg by mouth every 8 (eight) hours as needed for pain.  . Dulaglutide (TRULICITY) 1.5 0000000 SOPN Inject into the skin every 7 (seven) days.  . DULoxetine (CYMBALTA) 20 MG capsule TAKE (1) CAPSULE BY MOUTH ONCE DAILY.  Marland Kitchen gabapentin (NEURONTIN) 800 MG tablet   . metFORMIN (GLUCOPHAGE) 500 MG tablet Take 1 tablet by mouth 2 (two) times daily.  . Multiple Vitamins-Minerals (CENTRUM SILVER PO) Take 1 tablet daily by mouth.  . traZODone (DESYREL) 50 MG tablet Take 50 mg by mouth at bedtime.   . [DISCONTINUED] diclofenac (VOLTAREN) 75 MG EC tablet TAKE 1 TABLET BY MOUTH TWICE DAILY WITH A MEAL. (Patient not taking: Reported on 05/29/2018)  . [DISCONTINUED] TRULICITY A999333 0000000 SOPN every 7 (seven) days.   No facility-administered encounter medications on file as of 01/28/2019.   :  Review of Systems:  Out of a complete 14 point review of systems, all are reviewed and negative with the exception of these symptoms as listed below: Review of Systems  Neurological:       Pt presents today to discuss his neuropathy. Pt is complaining of occasional shooting pains that sometimes linger in his legs.    Objective:  Neurological Exam  Physical Exam Physical Examination:   Vitals:   01/28/19 1028  BP: 120/69  Pulse: 79   General Examination: The patient is a very pleasant 78 y.o. male in no acute distress. He appears Somewhat deconditioned, in his wheelchair.  He is well-groomed.    HEENT:Normocephalic,  atraumatic, pupils are equal, round and reactive to light and accommodation. Extraocular tracking is fair. S/p cataract repairs. Hearing is grossly intact to mildly impaired. Face is symmetric with normal facial animation and normal facial sensation. Speech is clear with  no dysarthria noted. There is no hypophonia. There is no lip, neck/head, jaw or voice tremor. Neck is supple with full range of passive and active motion. There are no carotid bruits on auscultation. Oropharynx exam reveals: moderate mouth dryness, marginal dental hygiene and several missing teeth and mild airway crowding. Mallampati is class I. Tongue protrudes centrally and palate elevates symmetrically. Tonsils are absent.   Chest:Clear to auscultation without wheezing, rhonchi or crackles noted.  Heart:S1+S2+0, regular and normal without murmurs, rubs or gallops noted.   Abdomen:Soft, non-tender and non-distended with normal bowel sounds appreciated on auscultation.  Extremities:There is 1+ pitting edema in the distal lower extremities bilaterally, more so on the R.   Skin: Warm and dry without trophic changes noted. Chronic changes and discoloration is seen in the distal LEs, R more than L.   Musculoskeletal: exam reveals no obvious joint deformities, tenderness or joint swelling or erythema.   Neurologically:  Mental status: The patient is awake, alert and oriented in all 4 spheres. His immediate and remote memory, attention, language skills and fund of knowledge are fair, minimally verbal. Speech is scant. Mood is normaland affect is normal.  Cranial nerves II - XII are as described above under HEENT exam.  Motor exam: thin bulk, global strength of 4/5. Romberg is not testable. Reflexes are trace in the UEs and absent in the LEs. Fine motor skills and coordination: globally impaired.   Cerebellar testing: No dysmetria or intention tremor. Heel to shin is not possible.  Sensory exam: Decreased to all modalities  up to higher calves in the lower extremities and up to the wrists in the upper extremities.   Gait, station and balance: He  is unable to stand or walk for me, did not bring his walker, unsafe to test.   Assessment and Plan:   In summary, Phong Meads [place] a very pleasant 78 year old male with an underlying  complex medical history of arthritis, cervical spine disease, hypertension, hyperlipidemia, diabetes, hospitalization last year for pneumonia with prolonged and complicated hospital course, history of flu earlier this year, and obesity, who presents for Follow-up consultation of his longstanding history of diabetic neuropathy.  He has had testing for this in the past.  He has been on Lyrica in the past, currently on gabapentin 800 mg 3 times daily per PCP and we also started him on low-dose Cymbalta which I would like to continue at 20 mg strength.  He has had some daytime somnolence.  He is advised to follow-up with his primary care physician regarding the lower extremity swelling.  He has had chronic swelling and chronic appearing changes.   For symptomatic treatment of his painful neuropathy he has also been on amitriptyline in the past.  He is advised to continue with Cymbalta 20 mg and follow-up with our nurse practitioner in about 6 months, we can certainly consider increasing it cautiously to 30 mg at the time but for now I would like to keep his medication the same.  He does have some breakthrough pain but overall seems to be quite stable. I answered all their questions today and the patient and his knees were in agreement. I spent 20 minutes in total face-to-face time with the patient, more than 50% of which was spent in counseling and coordination of care, reviewing test results, reviewing medication and discussing or reviewing the diagnosis of PN, its prognosis and treatment options. Pertinent laboratory and imaging test results that were available during this visit with the patient were  reviewed by me and considered in my medical decision making (see chart for details).

## 2019-06-05 ENCOUNTER — Encounter: Payer: Self-pay | Admitting: Internal Medicine

## 2019-06-19 DIAGNOSIS — G629 Polyneuropathy, unspecified: Secondary | ICD-10-CM | POA: Diagnosis not present

## 2019-06-19 DIAGNOSIS — Z79899 Other long term (current) drug therapy: Secondary | ICD-10-CM | POA: Diagnosis not present

## 2019-06-19 DIAGNOSIS — I10 Essential (primary) hypertension: Secondary | ICD-10-CM | POA: Diagnosis not present

## 2019-06-19 DIAGNOSIS — E1129 Type 2 diabetes mellitus with other diabetic kidney complication: Secondary | ICD-10-CM | POA: Diagnosis not present

## 2019-06-19 DIAGNOSIS — E785 Hyperlipidemia, unspecified: Secondary | ICD-10-CM | POA: Diagnosis not present

## 2019-06-19 DIAGNOSIS — G4700 Insomnia, unspecified: Secondary | ICD-10-CM | POA: Diagnosis not present

## 2019-06-26 DIAGNOSIS — G9009 Other idiopathic peripheral autonomic neuropathy: Secondary | ICD-10-CM | POA: Diagnosis not present

## 2019-06-26 DIAGNOSIS — E1129 Type 2 diabetes mellitus with other diabetic kidney complication: Secondary | ICD-10-CM | POA: Diagnosis not present

## 2019-07-29 ENCOUNTER — Ambulatory Visit: Payer: Medicare PPO | Admitting: Adult Health

## 2019-07-29 ENCOUNTER — Other Ambulatory Visit: Payer: Self-pay

## 2019-07-29 ENCOUNTER — Encounter: Payer: Self-pay | Admitting: Adult Health

## 2019-07-29 VITALS — BP 122/71 | HR 53 | Temp 98.2°F | Ht 71.0 in | Wt 264.0 lb

## 2019-07-29 DIAGNOSIS — R269 Unspecified abnormalities of gait and mobility: Secondary | ICD-10-CM

## 2019-07-29 DIAGNOSIS — E1142 Type 2 diabetes mellitus with diabetic polyneuropathy: Secondary | ICD-10-CM | POA: Diagnosis not present

## 2019-07-29 NOTE — Progress Notes (Signed)
PATIENT: Richard Fields DOB: January 08, 1941  REASON FOR VISIT: follow up HISTORY FROM: patient  HISTORY OF PRESENT ILLNESS: Today 07/29/19:  Richard Fields is an 79 year old male with a history of diabetic neuropathy.  He returns today for follow-up.  He is currently taking Cymbalta 20 mg daily.  He is on gabapentin 800 mg 3 times a day prescribed by his PCP.  He reports that this is working well for him.  On occasion he may have sharp shooting pains.  He continues to have swelling in the lower extremities.  Reports that he has seen his PCP but only through a telephone visit.  Reports that they are not doing in office visits.  He returns today for an evaluation.    HISTORY  01/28/2019: He reports feeling stable.  He still has some intermittent breakthrough pain in his legs.  He has noted swelling of his lower extremities for the past year or so.  He has an appointment with his primary care physician coming up next month.  His niece reports that he stays in his room primarily.  He has a recliner.  He sleeps in his recliner.  He also appears to be sleepy during the day.  It is unclear why he sleeps in the recliner.  He does come to the den for meals and to watch TV.  He uses a walker with no wheels.  He hydrates with water and diluted ginger ale.  As far she recalls his A1c has been below 7 last time the checked it, he has had better numbers since starting Trulicity several months ago. He takes Cymbalta 20 mg once daily in the morning, 800 mg of gabapentin 3 times daily per PCP.  He gets his medicines packaged for the week by his pharmacy.  He has an aide overnight from 7 PM to 7 AM daily.  Lori checks on the patient and his wife daily and also prepares meals.  REVIEW OF SYSTEMS: Out of a complete 14 system review of symptoms, the patient complains only of the following symptoms, and all other reviewed systems are negative.  see HPI  ALLERGIES: Allergies  Allergen Reactions  . Penicillins  Other (See Comments)    Unknown-reaction is unknown  . Tegretol [Carbamazepine] Itching  . Cefepime Rash    HOME MEDICATIONS: Outpatient Medications Prior to Visit  Medication Sig Dispense Refill  . acetaminophen (TYLENOL) 650 MG CR tablet Take 650 mg by mouth every 8 (eight) hours as needed for pain.    . Dulaglutide (TRULICITY) 1.5 0000000 SOPN Inject into the skin every 7 (seven) days.    . DULoxetine (CYMBALTA) 20 MG capsule TAKE (1) CAPSULE BY MOUTH ONCE DAILY. 28 capsule 11  . gabapentin (NEURONTIN) 800 MG tablet Take 800 mg by mouth 3 (three) times daily.   6  . metFORMIN (GLUCOPHAGE) 500 MG tablet Take 1 tablet by mouth 2 (two) times daily.    . Multiple Vitamins-Minerals (CENTRUM SILVER PO) Take 1 tablet daily by mouth.    . traZODone (DESYREL) 50 MG tablet Take 50 mg by mouth at bedtime.      No facility-administered medications prior to visit.    PAST MEDICAL HISTORY: Past Medical History:  Diagnosis Date  . Cervical disc disease   . Chronic constipation   . Depression   . Diabetes mellitus without complication ([place])    Borderline diabetic-diet controlled  . Duodenal ulcer 03/2016  . Empyema ([place]) 01/2016  . Hyperlipidemia   . Hypertension   .  Peripheral neuropathy     PAST SURGICAL HISTORY: Past Surgical History:  Procedure Laterality Date  . COLONOSCOPY  2003   RMR: internal hemorrhoids, otherwise normal  . COLONOSCOPY  05/30/2012   Dr. Gala Romney: redundant colon with single polyp removed (too small to process), internal hemorrhoids. surveillance in 7 years    . ESOPHAGOGASTRODUODENOSCOPY (EGD) WITH PROPOFOL N/A 03/14/2016   Dr. Cristina Gong: one non-bleeding duodenal ulcer  . FLEXIBLE BRONCHOSCOPY N/A 03/07/2016   Procedure: FLEXIBLE VIDEO BRONCHOSCOPY;  Surgeon: Gaye Pollack, MD;  Location: [facility];  Service: Thoracic;  Laterality: N/A;  . HERNIA REPAIR    . SKIN LESION EXCISION     moles removed  . TONSILLECTOMY    . VIDEO ASSISTED THORACOSCOPY  (VATS)/THOROCOTOMY Right 03/07/2016   Procedure: VIDEO ASSISTED THORACOSCOPY (VATS);  Surgeon: Gaye Pollack, MD;  Location: [facility] OR;  Service: Thoracic;  Laterality: Right;    FAMILY HISTORY: Family History  Problem Relation Age of Onset  . Cancer Father   . Colon cancer Paternal Uncle   . Diabetes Brother     SOCIAL HISTORY: Social History   Socioeconomic History  . Marital status: Married    Spouse name: Not on file  . Number of children: 0  . Years of education: BA  . Highest education level: Not on file  Occupational History  . Occupation: [employer]    Comment: retired  Tobacco Use  . Smoking status: Former Smoker    Years: 40.00  . Smokeless tobacco: Former Systems developer    Types: Chew  Substance and Sexual Activity  . Alcohol use: No  . Drug use: No  . Sexual activity: Not on file  Other Topics Concern  . Not on file  Social History Narrative   Right handed    Lives with wife   Caffeine use: none   Social Determinants of Health   Financial Resource Strain:   . Difficulty of Paying Living Expenses:   Food Insecurity:   . Worried About Charity fundraiser in the Last Year:   . Arboriculturist in the Last Year:   Transportation Needs:   . Film/video editor (Medical):   Marland Kitchen Lack of Transportation (Non-Medical):   Physical Activity:   . Days of Exercise per Week:   . Minutes of Exercise per Session:   Stress:   . Feeling of Stress :   Social Connections:   . Frequency of Communication with Friends and Family:   . Frequency of Social Gatherings with Friends and Family:   . Attends Religious Services:   . Active Member of Clubs or Organizations:   . Attends Archivist Meetings:   Marland Kitchen Marital Status:   Intimate Partner Violence:   . Fear of Current or Ex-Partner:   . Emotionally Abused:   Marland Kitchen Physically Abused:   . Sexually Abused:       PHYSICAL EXAM  Vitals:   07/29/19 0851  BP: 122/71  Pulse: (!) 53  Temp: 98.2 F (36.8  C)  Weight: 264 lb (119.7 kg)  Height: 5\' 11"  (1.803 m)   Body mass index is 36.82 kg/m.  Generalized: Well developed, in no acute distress   Neurological examination  Mentation: Alert oriented to time, place, history taking. Follows all commands speech and language fluent Cranial nerve II-XII: Extraocular movements were full, visual field were full on confrontational test. Facial sensation and strength were normal. Uvula tongue midline. Head turning and shoulder shrug  were normal  and symmetric. Motor: The motor testing reveals 5 over 5 strength of all 4 extremities. Good symmetric motor tone is noted throughout.  Sensory: Sensory testing is intact to soft touch on all 4 extremities. No evidence of extinction is noted.  Coordination: Cerebellar testing reveals good finger-nose-finger and heel-to-shin bilaterally.  Gait and station: Patient is in a wheelchair.  Uses a walker for short distances   DIAGNOSTIC DATA (LABS, IMAGING, TESTING) - I reviewed patient records, labs, notes, testing and imaging myself where available.  Lab Results  Component Value Date   WBC 4.5 06/05/2016   HGB 10.7 (L) 06/05/2016   HCT 32.9 (L) 06/05/2016   MCV 85.0 06/05/2016   PLT 161 06/05/2016      Component Value Date/Time   NA 140 06/08/2016 0617   K 3.5 06/08/2016 0617   CL 106 06/08/2016 0617   CO2 30 06/08/2016 0617   GLUCOSE 111 (H) 06/08/2016 0617   BUN 6 06/08/2016 0617   CREATININE 0.42 (L) 06/08/2016 0617   CALCIUM 7.2 (L) 06/08/2016 0617   PROT 4.6 (L) 06/05/2016 0647   ALBUMIN 1.8 (L) 06/05/2016 0647   AST 61 (H) 06/05/2016 0647   ALT 27 06/05/2016 0647   ALKPHOS 63 06/05/2016 0647   BILITOT 0.6 06/05/2016 0647   GFRNONAA >60 06/08/2016 0617   GFRAA >60 06/08/2016 0617   No results found for: CHOL, HDL, LDLCALC, LDLDIRECT, TRIG, CHOLHDL Lab Results  Component Value Date   HGBA1C 8.7 (H) 02/18/2016   Lab Results  Component Value Date   [id number] 03/12/2016   Lab  Results  Component Value Date   TSH 0.865 06/05/2016      ASSESSMENT AND PLAN 79 y.o. year old male  has a past medical history of Cervical disc disease, Chronic constipation, Depression, Diabetes mellitus without complication ([place]), Duodenal ulcer (03/2016), Empyema ([place]) (01/2016), Hyperlipidemia, Hypertension, and Peripheral neuropathy. here with :  1: Diabetic neuropathy  -Continue Cymbalta 20 mg daily -Advised that if PCP is amendable to prescribing Cymbalta then he can follow-up with [place] on an as-needed basis. -Otherwise he will need to follow-up on a yearly basis.   I spent 20 minutes of face-to-face and non-face-to-face time with patient.  This included previsit chart review, lab review, study review, order entry, electronic health record documentation, patient education.  Ward Givens, MSN, NP-C 07/29/2019, 8:57 AM [facility] [address] [place], [place] [address] [phone]

## 2019-07-29 NOTE — Patient Instructions (Signed)
Continue Cymbalta If your symptoms worsen or you develop new symptoms please let us know.

## 2019-10-23 DIAGNOSIS — E1129 Type 2 diabetes mellitus with other diabetic kidney complication: Secondary | ICD-10-CM | POA: Diagnosis not present

## 2019-10-30 DIAGNOSIS — E1129 Type 2 diabetes mellitus with other diabetic kidney complication: Secondary | ICD-10-CM | POA: Diagnosis not present

## 2019-10-30 DIAGNOSIS — G9009 Other idiopathic peripheral autonomic neuropathy: Secondary | ICD-10-CM | POA: Diagnosis not present

## 2019-12-13 ENCOUNTER — Other Ambulatory Visit: Payer: Self-pay | Admitting: Neurology

## 2020-02-07 ENCOUNTER — Other Ambulatory Visit: Payer: Self-pay | Admitting: Neurology

## 2020-02-20 DIAGNOSIS — E1129 Type 2 diabetes mellitus with other diabetic kidney complication: Secondary | ICD-10-CM | POA: Diagnosis not present

## 2020-02-26 DIAGNOSIS — R7309 Other abnormal glucose: Secondary | ICD-10-CM | POA: Diagnosis not present

## 2020-02-26 DIAGNOSIS — G9001 Carotid sinus syncope: Secondary | ICD-10-CM | POA: Diagnosis not present

## 2020-02-26 DIAGNOSIS — E1122 Type 2 diabetes mellitus with diabetic chronic kidney disease: Secondary | ICD-10-CM | POA: Diagnosis not present

## 2020-04-01 DIAGNOSIS — S43006A Unspecified dislocation of unspecified shoulder joint, initial encounter: Secondary | ICD-10-CM | POA: Diagnosis not present

## 2020-04-01 DIAGNOSIS — M79643 Pain in unspecified hand: Secondary | ICD-10-CM | POA: Diagnosis not present

## 2020-04-02 ENCOUNTER — Ambulatory Visit: Payer: Medicare PPO | Admitting: Orthopaedic Surgery

## 2020-04-02 ENCOUNTER — Encounter: Payer: Self-pay | Admitting: Orthopaedic Surgery

## 2020-04-02 ENCOUNTER — Other Ambulatory Visit: Payer: Self-pay

## 2020-04-02 VITALS — BP 123/76 | HR 84 | Ht 71.0 in | Wt 264.0 lb

## 2020-04-02 DIAGNOSIS — M65342 Trigger finger, left ring finger: Secondary | ICD-10-CM | POA: Diagnosis not present

## 2020-04-02 NOTE — Progress Notes (Signed)
Patient Richard Fields, male DOB:Jan 22, 1941, 79 y.o. QQV:[id number]  Chief Complaint  Patient presents with  . Hand Pain    Lt hand ring finger     HPI  MAHAMED ZALEWSKI is a 79 y.o. male who has developed locking of the left ring finger and unable to fully flex now.  He has no trauma, no redness.  He has some left shoulder pain as well.  He was seen at [facility] yesterday and X-rays were done of the left hand and shoulder.  I have reviewed the notes and X-rays.  I have independently reviewed and interpreted x-rays of this patient done at another site by another physician or qualified health professional.  He has no other major complaints today.   Body mass index is 36.82 kg/m.  ROS  Review of Systems  Constitutional: Positive for activity change.  Respiratory: Positive for shortness of breath.   Cardiovascular: Positive for palpitations.  Musculoskeletal: Positive for arthralgias, back pain, gait problem and joint swelling.    All other systems reviewed and are negative.  The following is a summary of the past history medically, past history surgically, known current medicines, social history and family history.  This information is gathered electronically by the computer from prior information and documentation.  I review this each visit and have found including this information at this point in the chart is beneficial and informative.    Past Medical History:  Diagnosis Date  . Cervical disc disease   . Chronic constipation   . Depression   . Diabetes mellitus without complication ([place])    Borderline diabetic-diet controlled  . Duodenal ulcer 03/2016  . Empyema ([place]) 01/2016  . Hyperlipidemia   . Hypertension   . Peripheral neuropathy     Past Surgical History:  Procedure Laterality Date  . COLONOSCOPY  2003   RMR: internal hemorrhoids, otherwise normal  . COLONOSCOPY  05/30/2012   Dr. Gala Romney: redundant colon with single polyp removed (too  small to process), internal hemorrhoids. surveillance in 7 years    . ESOPHAGOGASTRODUODENOSCOPY (EGD) WITH PROPOFOL N/A 03/14/2016   Dr. Cristina Gong: one non-bleeding duodenal ulcer  . FLEXIBLE BRONCHOSCOPY N/A 03/07/2016   Procedure: FLEXIBLE VIDEO BRONCHOSCOPY;  Surgeon: Gaye Pollack, MD;  Location: [facility];  Service: Thoracic;  Laterality: N/A;  . HERNIA REPAIR    . SKIN LESION EXCISION     moles removed  . TONSILLECTOMY    . VIDEO ASSISTED THORACOSCOPY (VATS)/THOROCOTOMY Right 03/07/2016   Procedure: VIDEO ASSISTED THORACOSCOPY (VATS);  Surgeon: Gaye Pollack, MD;  [facility];  Service: Thoracic;  Laterality: Right;    Family History  Problem Relation Age of Onset  . Cancer Father   . Colon cancer Paternal Uncle   . Diabetes Brother     Social History Social History   Tobacco Use  . Smoking status: Former Smoker    Years: 40.00  . Smokeless tobacco: Former Systems developer    Types: Chew  Substance Use Topics  . Alcohol use: No  . Drug use: No    Allergies  Allergen Reactions  . Penicillins Other (See Comments)    Unknown-reaction is unknown  . Tegretol [Carbamazepine] Itching  . Cefepime Rash    Current Outpatient Medications  Medication Sig Dispense Refill  . DULoxetine (CYMBALTA) 20 MG capsule TAKE (1) CAPSULE BY MOUTH ONCE DAILY. 28 capsule 11  . acetaminophen (TYLENOL) 650 MG CR tablet Take 650 mg by mouth every 8 (eight) hours as needed for  pain.    . Dulaglutide (TRULICITY) 1.5 ZJ/6.7HA SOPN Inject into the skin every 7 (seven) days.    Marland Kitchen gabapentin (NEURONTIN) 800 MG tablet Take 800 mg by mouth 3 (three) times daily.   6  . metFORMIN (GLUCOPHAGE) 500 MG tablet Take 1 tablet by mouth 2 (two) times daily.    . Multiple Vitamins-Minerals (CENTRUM SILVER PO) Take 1 tablet daily by mouth.    . traZODone (DESYREL) 100 MG tablet      No current facility-administered medications for this visit.     Physical Exam  Blood pressure 123/76, pulse 84, height 5\' 11"  (1.803  m), weight 264 lb (119.7 kg).  Constitutional: overall normal hygiene, normal nutrition, well developed, normal grooming, normal body habitus. Assistive device:wheelchair  Musculoskeletal: gait and station Limp does not want to stand, muscle tone and strength are normal, no tremors or atrophy is present.  .  Neurological: coordination overall normal.  Deep tendon reflex/nerve stretch intact.  Sensation normal.  Cranial nerves II-XII intact.   Skin:   Normal overall no scars, lesions, ulcers or rashes. No psoriasis.  Psychiatric: Alert and oriented x 3.  Recent memory intact, remote memory unclear.  Normal mood and affect. Well groomed.  Good eye contact.  Cardiovascular: overall no swelling, no varicosities, no edema bilaterally, normal temperatures of the legs and arms, no clubbing, cyanosis and good capillary refill.  Lymphatic: palpation is normal.  Left hand has pain over the left ring A1 pulley area.  He cannot fully flex the finger.  With assistance I can get it to lock in place and it is painful.  NV Intact.  His left shoulder is tender but has full motion. All other systems reviewed and are negative   The patient has been educated about the nature of the problem(s) and counseled on treatment options.  The patient appeared to understand what I have discussed and is in agreement with it.  Encounter Diagnosis  Name Primary?  . Trigger finger, left ring finger Yes   Procedure note: After permission from the patient the left palm was prepped.  I injected 1 % xylocaine and 1 cc DepoMedrol 40 into the area of the left ring A1 pulley area, by sterile technique, tolerated well.  He could move the finger better.  I told him that surgery is the definitive treatment.  PLAN Call if any problems.  Precautions discussed.  Continue current medications.   Return to clinic 1 week   Electronically [place], MD 12/2/20212:45 PM

## 2020-04-09 ENCOUNTER — Encounter: Payer: Self-pay | Admitting: Orthopaedic Surgery

## 2020-04-09 ENCOUNTER — Ambulatory Visit: Payer: Medicare PPO | Admitting: Orthopaedic Surgery

## 2020-04-09 ENCOUNTER — Other Ambulatory Visit: Payer: Self-pay

## 2020-04-09 VITALS — BP 123/71 | HR 80 | Ht 71.0 in

## 2020-04-09 DIAGNOSIS — M65342 Trigger finger, left ring finger: Secondary | ICD-10-CM

## 2020-04-09 NOTE — Progress Notes (Signed)
My finger is better  His left ring finger is moving today and not locking.  I told him definitive treatment is surgery.  I will see him back in one month.  If doing well, he may call and cancel.  Encounter Diagnosis  Name Primary?  . Trigger finger, left ring finger Yes   Call if any problem.  Precautions discussed.   Electronically Signed Sanjuana Kava, MD 12/9/202110:52 AM

## 2020-05-12 ENCOUNTER — Ambulatory Visit: Payer: Medicare PPO | Admitting: Orthopaedic Surgery

## 2020-05-12 ENCOUNTER — Other Ambulatory Visit: Payer: Self-pay

## 2020-05-12 ENCOUNTER — Encounter: Payer: Self-pay | Admitting: Orthopaedic Surgery

## 2020-05-12 VITALS — Ht 71.0 in

## 2020-05-12 DIAGNOSIS — M65342 Trigger finger, left ring finger: Secondary | ICD-10-CM | POA: Diagnosis not present

## 2020-05-12 NOTE — Progress Notes (Signed)
I am not triggering now  He has no triggering of the left ring finger.  He wears a splint at times.  He has no new trauma.  Encounter Diagnosis  Name Primary?  . Trigger finger, left ring finger Yes   I will see as needed.  If it starts triggering again, he will need surgery.  Call if any problem.  Precautions discussed.   Electronically Signed Sanjuana Kava, MD 1/11/20229:27 AM

## 2020-06-16 DIAGNOSIS — Z79899 Other long term (current) drug therapy: Secondary | ICD-10-CM | POA: Diagnosis not present

## 2020-06-16 DIAGNOSIS — E1129 Type 2 diabetes mellitus with other diabetic kidney complication: Secondary | ICD-10-CM | POA: Diagnosis not present

## 2020-06-16 DIAGNOSIS — E1140 Type 2 diabetes mellitus with diabetic neuropathy, unspecified: Secondary | ICD-10-CM | POA: Diagnosis not present

## 2020-06-16 DIAGNOSIS — E785 Hyperlipidemia, unspecified: Secondary | ICD-10-CM | POA: Diagnosis not present

## 2020-06-16 DIAGNOSIS — I10 Essential (primary) hypertension: Secondary | ICD-10-CM | POA: Diagnosis not present

## 2020-06-23 DIAGNOSIS — I482 Chronic atrial fibrillation, unspecified: Secondary | ICD-10-CM | POA: Diagnosis not present

## 2020-06-23 DIAGNOSIS — J189 Pneumonia, unspecified organism: Secondary | ICD-10-CM | POA: Diagnosis not present

## 2020-06-23 DIAGNOSIS — E1122 Type 2 diabetes mellitus with diabetic chronic kidney disease: Secondary | ICD-10-CM | POA: Diagnosis not present

## 2020-07-29 ENCOUNTER — Ambulatory Visit: Payer: Medicare PPO | Admitting: Adult Health

## 2020-10-20 DIAGNOSIS — E1129 Type 2 diabetes mellitus with other diabetic kidney complication: Secondary | ICD-10-CM | POA: Diagnosis not present

## 2020-10-27 DIAGNOSIS — G629 Polyneuropathy, unspecified: Secondary | ICD-10-CM | POA: Diagnosis not present

## 2020-10-27 DIAGNOSIS — E1122 Type 2 diabetes mellitus with diabetic chronic kidney disease: Secondary | ICD-10-CM | POA: Diagnosis not present

## 2020-10-27 DIAGNOSIS — R7309 Other abnormal glucose: Secondary | ICD-10-CM | POA: Diagnosis not present

## 2021-01-19 ENCOUNTER — Other Ambulatory Visit: Payer: Self-pay | Admitting: Neurology

## 2021-01-19 NOTE — Telephone Encounter (Signed)
Called and LMVM for pt to return call re: refill request duloxetine.  Getting from pcp?

## 2021-02-18 DIAGNOSIS — E1129 Type 2 diabetes mellitus with other diabetic kidney complication: Secondary | ICD-10-CM | POA: Diagnosis not present

## 2021-02-25 DIAGNOSIS — G4700 Insomnia, unspecified: Secondary | ICD-10-CM | POA: Diagnosis not present

## 2021-02-25 DIAGNOSIS — E1122 Type 2 diabetes mellitus with diabetic chronic kidney disease: Secondary | ICD-10-CM | POA: Diagnosis not present

## 2021-02-25 DIAGNOSIS — G629 Polyneuropathy, unspecified: Secondary | ICD-10-CM | POA: Diagnosis not present

## 2021-04-24 ENCOUNTER — Encounter (HOSPITAL_COMMUNITY): Payer: Self-pay

## 2021-04-24 ENCOUNTER — Emergency Department (HOSPITAL_COMMUNITY): Payer: Medicare PPO

## 2021-04-24 ENCOUNTER — Emergency Department (HOSPITAL_COMMUNITY)
Admission: EM | Admit: 2021-04-24 | Discharge: 2021-04-25 | Disposition: A | Discharge status: Home / Self Care | Payer: Medicare PPO | Attending: Emergency Medicine | Admitting: Emergency Medicine

## 2021-04-24 ENCOUNTER — Other Ambulatory Visit: Payer: Self-pay

## 2021-04-24 DIAGNOSIS — I6782 Cerebral ischemia: Secondary | ICD-10-CM | POA: Diagnosis not present

## 2021-04-24 DIAGNOSIS — Z794 Long term (current) use of insulin: Secondary | ICD-10-CM | POA: Insufficient documentation

## 2021-04-24 DIAGNOSIS — G319 Degenerative disease of nervous system, unspecified: Secondary | ICD-10-CM | POA: Diagnosis not present

## 2021-04-24 DIAGNOSIS — Z7984 Long term (current) use of oral hypoglycemic drugs: Secondary | ICD-10-CM | POA: Diagnosis not present

## 2021-04-24 DIAGNOSIS — R059 Cough, unspecified: Secondary | ICD-10-CM | POA: Diagnosis not present

## 2021-04-24 DIAGNOSIS — R0602 Shortness of breath: Secondary | ICD-10-CM | POA: Insufficient documentation

## 2021-04-24 DIAGNOSIS — E1140 Type 2 diabetes mellitus with diabetic neuropathy, unspecified: Secondary | ICD-10-CM | POA: Insufficient documentation

## 2021-04-24 DIAGNOSIS — E860 Dehydration: Secondary | ICD-10-CM | POA: Diagnosis not present

## 2021-04-24 DIAGNOSIS — R531 Weakness: Secondary | ICD-10-CM | POA: Diagnosis not present

## 2021-04-24 DIAGNOSIS — Y900 Blood alcohol level of less than 20 mg/100 ml: Secondary | ICD-10-CM | POA: Diagnosis not present

## 2021-04-24 DIAGNOSIS — Z20822 Contact with and (suspected) exposure to covid-19: Secondary | ICD-10-CM | POA: Diagnosis not present

## 2021-04-24 DIAGNOSIS — J3489 Other specified disorders of nose and nasal sinuses: Secondary | ICD-10-CM | POA: Diagnosis not present

## 2021-04-24 DIAGNOSIS — Z79899 Other long term (current) drug therapy: Secondary | ICD-10-CM | POA: Diagnosis not present

## 2021-04-24 DIAGNOSIS — J9811 Atelectasis: Secondary | ICD-10-CM | POA: Diagnosis not present

## 2021-04-24 DIAGNOSIS — Z87891 Personal history of nicotine dependence: Secondary | ICD-10-CM | POA: Insufficient documentation

## 2021-04-24 DIAGNOSIS — I517 Cardiomegaly: Secondary | ICD-10-CM | POA: Diagnosis not present

## 2021-04-24 DIAGNOSIS — I10 Essential (primary) hypertension: Secondary | ICD-10-CM | POA: Diagnosis not present

## 2021-04-24 DIAGNOSIS — R739 Hyperglycemia, unspecified: Secondary | ICD-10-CM

## 2021-04-24 DIAGNOSIS — E1165 Type 2 diabetes mellitus with hyperglycemia: Secondary | ICD-10-CM | POA: Insufficient documentation

## 2021-04-24 DIAGNOSIS — I6529 Occlusion and stenosis of unspecified carotid artery: Secondary | ICD-10-CM | POA: Diagnosis not present

## 2021-04-24 DIAGNOSIS — R0902 Hypoxemia: Secondary | ICD-10-CM | POA: Diagnosis not present

## 2021-04-24 LAB — COMPREHENSIVE METABOLIC PANEL
ALT: 42 U/L (ref 0–44)
AST: 31 U/L (ref 15–41)
Albumin: 3.5 g/dL (ref 3.5–5.0)
Alkaline Phosphatase: 81 U/L (ref 38–126)
Anion gap: 11 (ref 5–15)
BUN: 13 mg/dL (ref 8–23)
CO2: 26 mmol/L (ref 22–32)
Calcium: 9.1 mg/dL (ref 8.9–10.3)
Chloride: 97 mmol/L — ABNORMAL LOW (ref 98–111)
Creatinine, Ser: 0.8 mg/dL (ref 0.61–1.24)
GFR, Estimated: >60 mL/min (ref >60)
Glucose, Bld: 323 mg/dL — ABNORMAL HIGH (ref 70–99)
Potassium: 4.3 mmol/L (ref 3.5–5.1)
Sodium: 134 mmol/L — ABNORMAL LOW (ref 135–145)
Total Bilirubin: 0.7 mg/dL (ref 0.3–1.2)
Total Protein: 7.6 g/dL (ref 6.5–8.1)

## 2021-04-24 LAB — URINALYSIS, ROUTINE W REFLEX MICROSCOPIC
Bilirubin Urine: NEGATIVE
Glucose, UA: >=500 mg/dL — AB
Ketones, ur: 15 mg/dL — AB
Leukocytes,Ua: NEGATIVE
Nitrite: NEGATIVE
Protein, ur: NEGATIVE mg/dL
Specific Gravity, Urine: 1.01 (ref 1.005–1.030)
pH: 7 (ref 5.0–8.0)

## 2021-04-24 LAB — CBC WITH DIFFERENTIAL/PLATELET
Abs Immature Granulocytes: 0.03 10*3/uL (ref 0.00–0.07)
Basophils Absolute: 0.1 10*3/uL (ref 0.0–0.1)
Basophils Relative: 1 %
Eosinophils Absolute: 0 10*3/uL (ref 0.0–0.5)
Eosinophils Relative: 0 %
HCT: 44.6 % (ref 39.0–52.0)
Hemoglobin: 15 g/dL (ref 13.0–17.0)
Immature Granulocytes: 0 %
Lymphocytes Relative: 7 %
Lymphs Abs: 0.7 10*3/uL (ref 0.7–4.0)
MCH: 32.1 pg (ref 26.0–34.0)
MCHC: 33.6 g/dL (ref 30.0–36.0)
MCV: 95.3 fL (ref 80.0–100.0)
Monocytes Absolute: 0.9 10*3/uL (ref 0.1–1.0)
Monocytes Relative: 8 %
Neutro Abs: 8.6 10*3/uL — ABNORMAL HIGH (ref 1.7–7.7)
Neutrophils Relative %: 84 %
Platelets: 233 10*3/uL (ref 150–400)
RBC: 4.68 MIL/uL (ref 4.22–5.81)
RDW: 12.3 % (ref 11.5–15.5)
WBC: 10.2 10*3/uL (ref 4.0–10.5)
nRBC: 0 % (ref 0.0–0.2)

## 2021-04-24 LAB — RESP PANEL BY RT-PCR (FLU A&B, COVID) ARPGX2
Influenza A by PCR: NEGATIVE
Influenza B by PCR: NEGATIVE
SARS Coronavirus 2 by RT PCR: NEGATIVE

## 2021-04-24 LAB — LACTIC ACID, PLASMA
Lactic Acid, Venous: 3 mmol/L (ref 0.5–1.9)
Lactic Acid, Venous: 2.1 mmol/L (ref 0.5–1.9)

## 2021-04-24 LAB — URINALYSIS, MICROSCOPIC (REFLEX)
Bacteria, UA: NONE SEEN
RBC / HPF: >50 RBC/hpf (ref 0–5)

## 2021-04-24 LAB — CBG MONITORING, ED
Glucose-Capillary: 300 mg/dL — ABNORMAL HIGH (ref 70–99)
Glucose-Capillary: 253 mg/dL — ABNORMAL HIGH (ref 70–99)

## 2021-04-24 LAB — ETHANOL: Alcohol, Ethyl (B): <10 mg/dL (ref <10)

## 2021-04-24 LAB — BRAIN NATRIURETIC PEPTIDE: B Natriuretic Peptide: 30 pg/mL (ref 0.0–100.0)

## 2021-04-24 MED ORDER — SODIUM CHLORIDE 0.9 % IV SOLN: INTRAVENOUS | Status: DC

## 2021-04-24 MED ORDER — SODIUM CHLORIDE 0.9 % IV BOLUS
1000.0000 mL | Freq: Once | INTRAVENOUS | Status: AC
  Administered 2021-04-24: 17:00:00 1000 mL via INTRAVENOUS

## 2021-04-24 MED ORDER — IPRATROPIUM-ALBUTEROL 0.5-2.5 (3) MG/3ML IN SOLN
3.0000 mL | Freq: Once | RESPIRATORY_TRACT | Status: AC
  Administered 2021-04-24: 16:00:00 3 mL via RESPIRATORY_TRACT
  Filled 2021-04-24: qty 3

## 2021-04-24 NOTE — Discharge Instructions (Signed)
Your testing today showed that you were dehydrated, thankfully it shows no other problems including no signs of infections that would need antibiotics, there is no pneumonia, there is no signs of stroke, I do want you to drink plenty of clear liquids and return to the emergency department immediately for severe or worsening symptoms.  Make sure that you are taking all of your daily medications including your metformin for your diabetes.  Please have your family doctor recheck you within 2 or 3 days.

## 2021-04-24 NOTE — ED Provider Notes (Signed)
[facility] EMERGENCY DEPARTMENT Provider Note   CSN: [id number] Arrival date & time: 04/24/21  1424     History Chief Complaint  Patient presents with   Weakness    Richard Fields is a 80 y.o. male.   Weakness  This patient is an 80 year old male, he has a history of diabetes, hyperlipidemia and hypertension, he has a prior history of an empyema as well as chronic constipation.  He lives by himself and walks with a walker -and reports that he has had 3 to 4 days of upper respiratory symptoms including nasal drainage and congestion as well as a bit of a cough.  This morning he felt like he was more weak than usual and that is why he called paramedics.  When the paramedics arrived they advised him to come to the hospital to be checked.  The patient denies frank shortness of breath, denies nausea vomiting or diarrhea, denies swelling or rashes of the legs, denies fevers or chills.  He states that he has been able to eat some Cheerios this morning and was able to get to his bedside commode to urinate this morning.  He has not been given any medications for his respiratory illness prior to the visit today.  Past Medical History:  Diagnosis Date   Cervical disc disease    Chronic constipation    Depression    Diabetes mellitus without complication ([place])    Borderline diabetic-diet controlled   Duodenal ulcer 03/2016   Empyema ([place]) 01/2016   Hyperlipidemia    Hypertension    Peripheral neuropathy     Patient Active Problem List   Diagnosis Date Noted   Gait abnormality 05/29/2018   Diabetic peripheral neuropathy ([place]) 07/10/2017   PUD (peptic ulcer disease) 06/05/2017   UTI (urinary tract infection) 06/04/2016   Influenza A 06/04/2016   Duodenal ulcer with hemorrhage 04/04/2016   Uncontrolled type 2 diabetes mellitus with complication    Atrial fibrillation (HCC)    Acute pain of right shoulder    Empyema ([place]) 03/07/2016   Abdominal pain, right lateral    Aspirin  long-term use    Hypoxia    Pleural effusion on right    S/P thoracentesis    SOB (shortness of breath)    Acute upper GI bleeding 02/28/2016   Acute blood loss anemia 02/28/2016   Protein-calorie malnutrition ([place]) 02/28/2016   Acute respiratory failure with hypoxia ([place]) 02/26/2016   Encephalopathy, metabolic 58/52/7782   Type II diabetes mellitus, uncontrolled 02/24/2016   Sepsis ([place]) 02/24/2016   Elevated troponin level 02/24/2016   Pressure injury of skin 02/19/2016   Cavitary pneumonia 02/18/2016   Hemoptysis 02/18/2016   Morbid obesity ([place]) 02/18/2016   Pneumatosis intestinalis 02/18/2016   Hypertension 12/02/2015   Screening 05/21/2012    Past Surgical History:  Procedure Laterality Date   COLONOSCOPY  2003   RMR: internal hemorrhoids, otherwise normal   COLONOSCOPY  05/30/2012   Dr. Gala Romney: redundant colon with single polyp removed (too small to process), internal hemorrhoids. surveillance in 7 years     ESOPHAGOGASTRODUODENOSCOPY (EGD) WITH PROPOFOL N/A 03/14/2016   Dr. Cristina Gong: one non-bleeding duodenal ulcer   FLEXIBLE BRONCHOSCOPY N/A 03/07/2016   Procedure: FLEXIBLE VIDEO BRONCHOSCOPY;  Surgeon: Gaye Pollack, MD;  Location: [facility] OR;  Service: Thoracic;  Laterality: N/A;   HERNIA REPAIR     SKIN LESION EXCISION     moles removed   TONSILLECTOMY     VIDEO ASSISTED THORACOSCOPY (VATS)/THOROCOTOMY Right 03/07/2016  Procedure: VIDEO ASSISTED THORACOSCOPY (VATS);  Surgeon: Gaye Pollack, MD;  Location: [facility] OR;  Service: Thoracic;  Laterality: Right;       Family History  Problem Relation Age of Onset   Cancer Father    Colon cancer Paternal Uncle    Diabetes Brother     Social History   Tobacco Use   Smoking status: Former    Years: 40.00    Types: Cigarettes   Smokeless tobacco: Former    Types: Chew  Substance Use Topics   Alcohol use: No   Drug use: No    Home Medications Prior to Admission medications   Medication Sig Start Date End Date  Taking? Authorizing Provider  acetaminophen (TYLENOL) 650 MG CR tablet Take 650 mg by mouth every 8 (eight) hours as needed for pain.   Yes [provider]  Dulaglutide (TRULICITY) 1.5 PI/9.5JO SOPN Inject 1.5 mg into the skin every 7 (seven) days. Takes on Fridays   Yes [provider]  DULoxetine (CYMBALTA) 20 MG capsule TAKE (1) CAPSULE BY MOUTH ONCE DAILY. 01/20/21  Yes Star Age, MD  gabapentin (NEURONTIN) 800 MG tablet Take 800 mg by mouth 3 (three) times daily.  10/03/17  Yes [provider]  guaiFENesin (MUCINEX) 600 MG 12 hr tablet Take 600 mg by mouth 2 (two) times daily as needed.   Yes [provider]  metFORMIN (GLUCOPHAGE) 500 MG tablet Take 1 tablet by mouth 2 (two) times daily. 02/08/16  Yes [provider]  Multiple Vitamins-Minerals (CENTRUM SILVER PO) Take 1 tablet daily by mouth.   Yes [provider]  traZODone (DESYREL) 100 MG tablet Take 100 mg by mouth at bedtime. 03/12/20  Yes [provider]    Allergies    Penicillins, Tegretol [carbamazepine], and Cefepime  Review of Systems   Review of Systems  Neurological:  Positive for weakness.  All other systems reviewed and are negative.  Physical Exam Updated Vital Signs BP 125/66    Pulse 86    Temp 98.7 F (37.1 C) (Oral)    Resp (!) 25    Ht 1.803 m (5\' 11" )    Wt 119.7 kg    SpO2 96%    BMI 36.81 kg/m   Physical Exam Vitals and nursing note reviewed.  Constitutional:      General: He is not in acute distress.    Appearance: He is well-developed.  HENT:     Head: Normocephalic and atraumatic.     Nose: Rhinorrhea present. No congestion.     Mouth/Throat:     Pharynx: No oropharyngeal exudate.  Eyes:     General: No scleral icterus.       Right eye: No discharge.        Left eye: No discharge.     Conjunctiva/sclera: Conjunctivae normal.     Pupils: Pupils are equal, round, and reactive to light.  Neck:     Thyroid: No thyromegaly.      Vascular: No JVD.  Cardiovascular:     Rate and Rhythm: Normal rate and regular rhythm.     Heart sounds: Normal heart sounds. No murmur heard.   No friction rub. No gallop.  Pulmonary:     Effort: Pulmonary effort is normal. No respiratory distress.     Breath sounds: Wheezing present. No rales.  Abdominal:     General: Bowel sounds are normal. There is no distension.     Palpations: Abdomen is soft. There is no mass.  Tenderness: There is no abdominal tenderness.  Musculoskeletal:        General: No tenderness. Normal range of motion.     Cervical back: Normal range of motion and neck supple.     Right lower leg: No edema.     Left lower leg: No edema.  Lymphadenopathy:     Cervical: No cervical adenopathy.  Skin:    General: Skin is warm and dry.     Findings: No erythema or rash.  Neurological:     Mental Status: He is alert.     Coordination: Coordination normal.     Comments: There is a subtle left-sided facial droop but when the patient smiles this completely goes away.  He has no other cranial nerve abnormalities.  He is able to lift all 4 extremities with normal strength.  He has some difficulty sitting up in the bed without assistance but is able to maintain a sitting position.  His voice is strong, clear and goal-directed.  Psychiatric:        Behavior: Behavior normal.    ED Results / Procedures / Treatments   Labs (all labs ordered are listed, but only abnormal results are displayed) Labs Reviewed  COMPREHENSIVE METABOLIC PANEL - Abnormal; Notable for the following components:      Result Value   Sodium 134 (*)    Chloride 97 (*)    Glucose, Bld 323 (*)    All other components within normal limits  CBC WITH DIFFERENTIAL/PLATELET - Abnormal; Notable for the following components:   Neutro Abs 8.6 (*)    All other components within normal limits  URINALYSIS, ROUTINE W REFLEX MICROSCOPIC - Abnormal; Notable for the following components:   Glucose, UA >=500 (*)     Hgb urine dipstick LARGE (*)    Ketones, ur 15 (*)    All other components within normal limits  LACTIC ACID, PLASMA - Abnormal; Notable for the following components:   Lactic Acid, Venous 3.0 (*)    All other components within normal limits  LACTIC ACID, PLASMA - Abnormal; Notable for the following components:   Lactic Acid, Venous 2.1 (*)    All other components within normal limits  CBG MONITORING, ED - Abnormal; Notable for the following components:   Glucose-Capillary 300 (*)    All other components within normal limits  CBG MONITORING, ED - Abnormal; Notable for the following components:   Glucose-Capillary 253 (*)    All other components within normal limits  RESP PANEL BY RT-PCR (FLU A&B, COVID) ARPGX2  URINE CULTURE  ETHANOL  BRAIN NATRIURETIC PEPTIDE  URINALYSIS, MICROSCOPIC (REFLEX)    EKG EKG Interpretation  Date/Time:  Saturday April 24 2021 15:56:34 EST Ventricular Rate:  98 PR Interval:  174 QRS Duration: 144 QT Interval:  390 QTC Calculation: 498 R Axis:   104 Text Interpretation: Sinus or ectopic atrial rhythm Atrial premature complex RBBB and LPFB Since last tracing in 2018, RBBB now present, no other significant changes Confirmed by Noemi Chapel [phone]) on 04/24/2021 4:09:02 PM  Radiology CT Head Wo Contrast  Result Date: 04/24/2021 CLINICAL DATA:  Weakness EXAM: CT HEAD WITHOUT CONTRAST TECHNIQUE: Contiguous axial images were obtained from the base of the skull through the vertex without intravenous contrast. COMPARISON:  Brain MRI 03/12/2013 FINDINGS: Brain: No acute territorial infarction, hemorrhage, or intracranial mass is visualized. 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 Moderate atrophy. Mild chronic  small vessel ischemic changes of the white matter. The ventricles are nonenlarged Vascular: No hyperdense vessels.   Carotid vascular calcification. Skull: Normal. Negative for fracture or focal lesion. Sinuses/Orbits: Mild mucosal thickening in the sinuses Other: None IMPRESSION: 1. No definite CT evidence for acute intracranial abnormality. 2. Atrophy and chronic small vessel ischemic changes of the white matter. Slightly asymmetric enlarged extra-axial CSF spaces bilaterally suggestive of chronic subdural collections. Electronically Signed   By: Donavan Foil M.D.   On: 04/24/2021 22:21   DG Chest Portable 1 View  Result Date: 04/24/2021 CLINICAL DATA:  Cough and shortness of breath EXAM: PORTABLE CHEST 1 VIEW COMPARISON:  06/04/2016 FINDINGS: Numerous leads and wires project over the chest. Apical lordotic positioning. Cardiomegaly accentuated by AP portable technique. Tortuous thoracic aorta. Mild to moderate right hemidiaphragm elevation. No pleural effusion or pneumothorax. No congestive failure. Mild right base subsegmental atelectasis. No lobar consolidation. IMPRESSION: No acute cardiopulmonary disease. Cardiomegaly without congestive failure. Electronically Signed   By: Abigail Miyamoto M.D.   On: 04/24/2021 16:13    Procedures Procedures   Medications Ordered in ED Medications  0.9 %  sodium chloride infusion (0 mLs Intravenous Stopped 04/24/21 2102)  ipratropium-albuterol (DUONEB) 0.5-2.5 (3) MG/3ML nebulizer solution 3 mL (3 mLs Nebulization Given 04/24/21 1605)  sodium chloride 0.9 % bolus 1,000 mL (0 mLs Intravenous Stopped 04/24/21 1919)    ED Course  I have reviewed the triage vital signs and the nursing notes.  Pertinent labs & imaging results that were available during my care of the patient were reviewed by me and considered in my medical decision making (see chart for details).    MDM Rules/Calculators/A&P                          This patient presents to the ED for concern of generalized weakness, this involves an extensive number of treatment options, and is a complaint that carries  with it a high risk of complications and morbidity.  The differential diagnosis includes metabolic, infectious, neurologic, would also consider COVID or flu   Additional history obtained:  Additional history obtained from medical record, this shows that the patient does not fact have multiple medical problems, he does actively see neurology, orthopedics, gastroenterology as well as family doctor. External records from outside source obtained and reviewed including that stated above   Lab Tests:  I Ordered, reviewed, and interpreted labs.  The pertinent results include: CBC, metabolic panel, urinalysis, COVID and flu swabs   Imaging Studies ordered:  I ordered imaging studies including portable chest x-ray I independently visualized and interpreted imaging which showed no acute findings to suggest pneumonia, no findings on the CT scan of the brain to suggest stroke or infarct or hemorrhage or tumor I agree with the radiologist interpretation   Cardiac Monitoring:  The patient was maintained on a cardiac monitor.  I personally viewed and interpreted the cardiac monitored which showed an underlying rhythm of: Normal sinus rhythm   Medicines ordered and prescription drug management:  I ordered medication including normal saline for dehydration, and albuterol treatment as a DuoNeb for shortness of breath and cough Reevaluation of the patient after these medicines showed that the patient improved, no more wheezing, normal oxygenation, no distress whatsoever I have reviewed the patients home medicines and have made adjustments as needed   Critical Interventions:  IV fluid hydration Albuterol and DuoNeb treatments for shortness of breath   ED Course:  The patient  improved significantly, his mental status is normal, his vital signs reflect no hypoxia no tachycardia no hypotension and no fever.  He is flu and COVID-negative   Consultations Obtained:  I requested consultation with  the radiologist,  and discussed lab and imaging findings as well as pertinent plan - they identified No findings of subdural hematomas, no signs of stroke   Reevaluation:  After the interventions noted above, I reevaluated the patient and found that they have :improved   Dispostion:  After consideration of the diagnostic results and the patients response to treatment feel that the patent would benefit from discharge home  Final Clinical Impression(s) / ED Diagnoses Final diagnoses:  Dehydration  Hyperglycemia     Noemi Chapel, MD 04/24/21 2235

## 2021-04-24 NOTE — ED Notes (Signed)
Patient transported to CT 

## 2021-04-24 NOTE — ED Notes (Signed)
Pt request EMS transport home. [facility] has been notified.

## 2021-04-24 NOTE — ED Triage Notes (Signed)
Pt presents to ED with complaints of weakness over the last few days, swollen ankles, swollen abdomen. CBG 357. Richard Fields [phone]

## 2021-04-24 NOTE — ED Notes (Signed)
X-ray at bedside

## 2021-04-25 DIAGNOSIS — Z7401 Bed confinement status: Secondary | ICD-10-CM | POA: Diagnosis not present

## 2021-04-25 DIAGNOSIS — R279 Unspecified lack of coordination: Secondary | ICD-10-CM | POA: Diagnosis not present

## 2021-04-25 DIAGNOSIS — R52 Pain, unspecified: Secondary | ICD-10-CM | POA: Diagnosis not present

## 2021-04-26 LAB — URINE CULTURE: Culture: <10000 — AB

## 2021-05-27 DIAGNOSIS — E669 Obesity, unspecified: Secondary | ICD-10-CM | POA: Diagnosis not present

## 2021-05-27 DIAGNOSIS — E1129 Type 2 diabetes mellitus with other diabetic kidney complication: Secondary | ICD-10-CM | POA: Diagnosis not present

## 2021-05-27 DIAGNOSIS — G4700 Insomnia, unspecified: Secondary | ICD-10-CM | POA: Diagnosis not present

## 2021-05-27 DIAGNOSIS — Z79899 Other long term (current) drug therapy: Secondary | ICD-10-CM | POA: Diagnosis not present

## 2021-05-27 DIAGNOSIS — G9009 Other idiopathic peripheral autonomic neuropathy: Secondary | ICD-10-CM | POA: Diagnosis not present

## 2021-06-03 DIAGNOSIS — G629 Polyneuropathy, unspecified: Secondary | ICD-10-CM | POA: Diagnosis not present

## 2021-06-03 DIAGNOSIS — E0800 Diabetes mellitus due to underlying condition with hyperosmolarity without nonketotic hyperglycemic-hyperosmolar coma (NKHHC): Secondary | ICD-10-CM | POA: Diagnosis not present

## 2021-06-03 DIAGNOSIS — I480 Paroxysmal atrial fibrillation: Secondary | ICD-10-CM | POA: Diagnosis not present

## 2021-06-03 DIAGNOSIS — R7309 Other abnormal glucose: Secondary | ICD-10-CM | POA: Diagnosis not present

## 2021-08-19 ENCOUNTER — Ambulatory Visit: Payer: Medicare PPO | Admitting: Orthopaedic Surgery

## 2021-08-19 ENCOUNTER — Encounter: Payer: Self-pay | Admitting: Orthopaedic Surgery

## 2021-08-19 VITALS — BP 117/60 | HR 72 | Ht 71.0 in | Wt 263.0 lb

## 2021-08-19 DIAGNOSIS — G8929 Other chronic pain: Secondary | ICD-10-CM

## 2021-08-19 DIAGNOSIS — M25561 Pain in right knee: Secondary | ICD-10-CM

## 2021-08-19 NOTE — Progress Notes (Signed)
My knee is hurting ? ?He has chronic pain of the right knee.  I have not seen him in some time.  He has no trauma.  He has swelling and popping.  He usually sits in wheelchair and needs assistance getting up.  His pain started about ten days ago and still occurs.  He has no numbness, no redness. ? ?ROM of the right knee is 0 to 105, crepitus, effusion, stable, NV intact, no distal edema. ? ?Encounter Diagnosis  ?Name Primary?  ? Chronic pain of right knee Yes  ? ?PROCEDURE NOTE: ? ?The patient requests injections of the right knee , verbal consent was obtained. ? ?The right knee was prepped appropriately after time out was performed.  ? ?Sterile technique was observed and injection of 1 cc of DepoMedrol '40mg'$  with several cc's of plain xylocaine. Anesthesia was provided by ethyl chloride and a 20-gauge needle was used to inject the knee area. The injection was tolerated well.  A band aid dressing was applied. ? ?The patient was advised to apply ice later today and tomorrow to the injection sight as needed. ? ?Return in two weeks. ? ?Call if any problem. ? ?Precautions discussed. ? ?Electronically Signed ?Sanjuana Kava, MD ?4/20/20238:34 AM ? ?

## 2021-08-26 DIAGNOSIS — E1140 Type 2 diabetes mellitus with diabetic neuropathy, unspecified: Secondary | ICD-10-CM | POA: Diagnosis not present

## 2021-08-26 DIAGNOSIS — Z79899 Other long term (current) drug therapy: Secondary | ICD-10-CM | POA: Diagnosis not present

## 2021-09-02 DIAGNOSIS — G629 Polyneuropathy, unspecified: Secondary | ICD-10-CM | POA: Diagnosis not present

## 2021-09-02 DIAGNOSIS — I480 Paroxysmal atrial fibrillation: Secondary | ICD-10-CM | POA: Diagnosis not present

## 2021-09-02 DIAGNOSIS — E1140 Type 2 diabetes mellitus with diabetic neuropathy, unspecified: Secondary | ICD-10-CM | POA: Diagnosis not present

## 2021-09-02 DIAGNOSIS — R7309 Other abnormal glucose: Secondary | ICD-10-CM | POA: Diagnosis not present

## 2021-09-07 ENCOUNTER — Ambulatory Visit: Payer: Medicare PPO | Admitting: Orthopaedic Surgery

## 2021-09-07 ENCOUNTER — Encounter: Payer: Self-pay | Admitting: Orthopaedic Surgery

## 2021-09-07 VITALS — BP 110/58 | HR 68 | Ht 71.0 in | Wt 263.0 lb

## 2021-09-07 DIAGNOSIS — G8929 Other chronic pain: Secondary | ICD-10-CM

## 2021-09-07 DIAGNOSIS — M25561 Pain in right knee: Secondary | ICD-10-CM

## 2021-09-07 NOTE — Progress Notes (Signed)
My knee is better. ? ?The injection last time helped the right knee.  He has less pain, less swelling.  It still hurts but not as much.  He has no new trauma. ? ?Right knee has slight effusion, has crepitus, ROM 0 to 105, medial joint line pain, no distal edema.  He is in wheelchair. ? ?Encounter Diagnosis  ?Name Primary?  ? Chronic pain of right knee Yes  ? ?I will see in one month. ? ?Call if any problem. ? ?Precautions discussed. ? ?Electronically Signed ?Sanjuana Kava, MD ?5/9/20238:47 AM ? ?

## 2021-10-05 ENCOUNTER — Encounter: Payer: Self-pay | Admitting: Orthopaedic Surgery

## 2021-10-05 ENCOUNTER — Ambulatory Visit: Payer: Medicare PPO | Admitting: Orthopaedic Surgery

## 2021-10-05 DIAGNOSIS — G8929 Other chronic pain: Secondary | ICD-10-CM

## 2021-10-05 DIAGNOSIS — M25561 Pain in right knee: Secondary | ICD-10-CM | POA: Diagnosis not present

## 2021-10-05 NOTE — Progress Notes (Signed)
My knee is better.  He has less pain of the right knee and less swelling.  He has no new trauma.  The injection last time helped.  He has popping.  Right knee has effusion slight, crepitus, ROM 0 to 105, stable.  NV intact.  He is in wheelchair.  Encounter Diagnosis  Name Primary?   Chronic pain of right knee Yes   I will see as needed.  Call if any problem.  Precautions discussed.  Electronically Signed Sanjuana Kava, MD 6/6/20238:03 AM

## 2021-12-02 DIAGNOSIS — E1140 Type 2 diabetes mellitus with diabetic neuropathy, unspecified: Secondary | ICD-10-CM | POA: Diagnosis not present

## 2022-01-20 DIAGNOSIS — G629 Polyneuropathy, unspecified: Secondary | ICD-10-CM | POA: Diagnosis not present

## 2022-01-20 DIAGNOSIS — E1140 Type 2 diabetes mellitus with diabetic neuropathy, unspecified: Secondary | ICD-10-CM | POA: Diagnosis not present

## 2022-01-20 DIAGNOSIS — I480 Paroxysmal atrial fibrillation: Secondary | ICD-10-CM | POA: Diagnosis not present

## 2022-01-20 DIAGNOSIS — R7309 Other abnormal glucose: Secondary | ICD-10-CM | POA: Diagnosis not present

## 2022-02-21 DIAGNOSIS — G9009 Other idiopathic peripheral autonomic neuropathy: Secondary | ICD-10-CM | POA: Diagnosis not present

## 2022-02-21 DIAGNOSIS — E1140 Type 2 diabetes mellitus with diabetic neuropathy, unspecified: Secondary | ICD-10-CM | POA: Diagnosis not present

## 2022-02-21 DIAGNOSIS — I482 Chronic atrial fibrillation, unspecified: Secondary | ICD-10-CM | POA: Diagnosis not present

## 2022-04-14 IMAGING — CT CT HEAD W/O CM
3 series · 15 of 47 positions shown, 18 images · non-contrast
Comparison: Brain MRI 03/12/2013

CLINICAL DATA: Weakness

EXAM:
CT HEAD WITHOUT CONTRAST
TECHNIQUE: Contiguous axial images were obtained from the base of the skull
through the vertex without intravenous contrast.

[Series 2: head w o · axial · 0.46mm/px · z∈[-64,+76]mm · 9 of 34 slices shown, 12 images]
[im 3/34  brain]
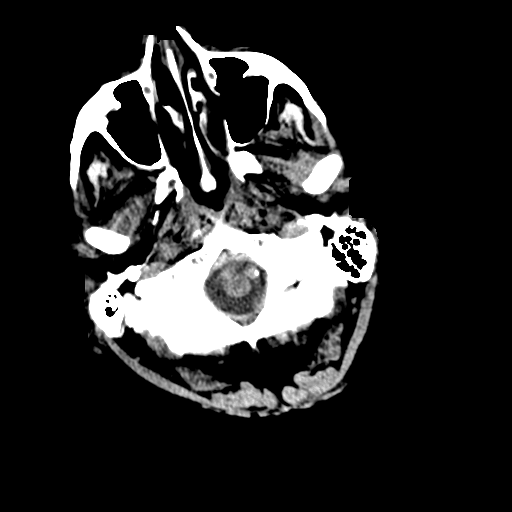
[im 3/34  bone]
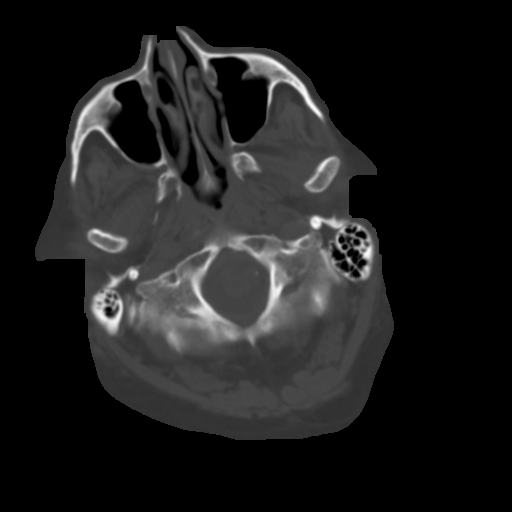
[im 6/34  brain]
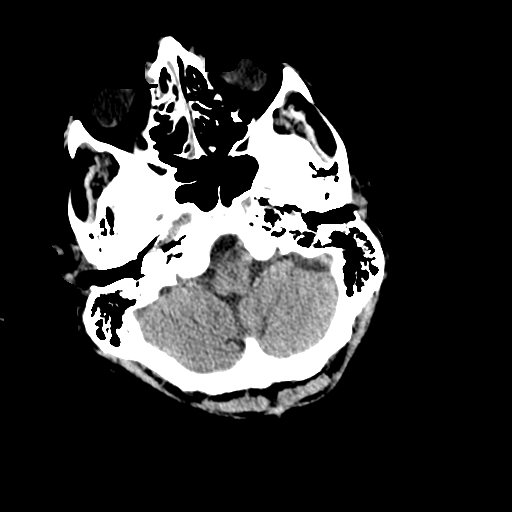
[im 10/34  brain]
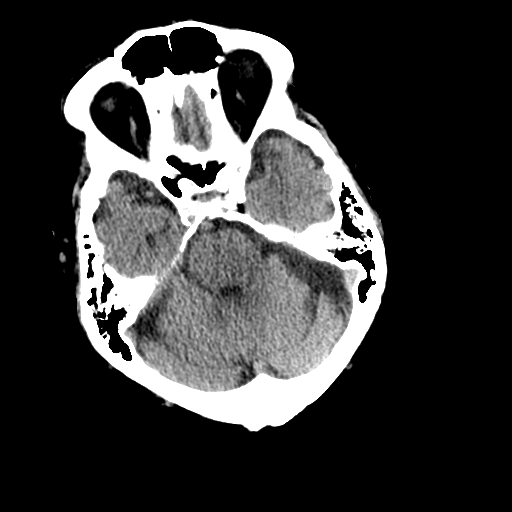
[im 13/34  brain]
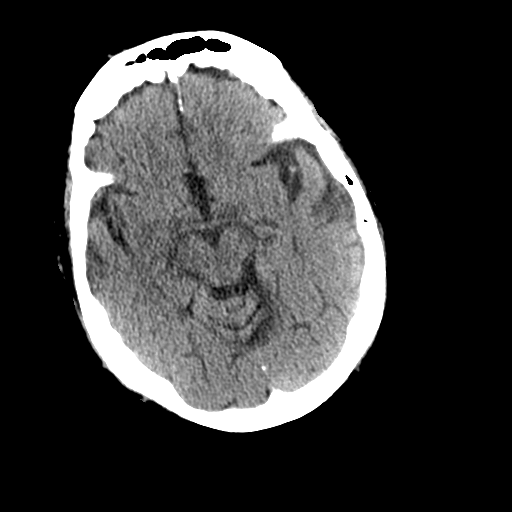
[im 18/34  brain]
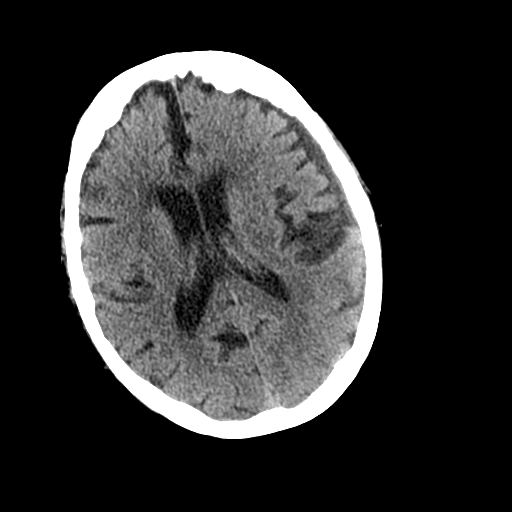
[im 18/34  bone]
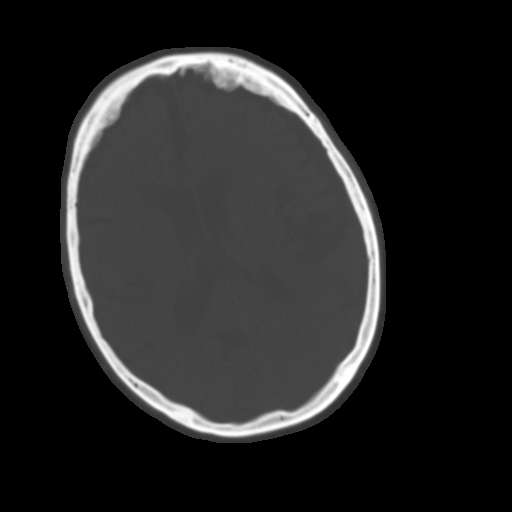
[im 21/34  brain]
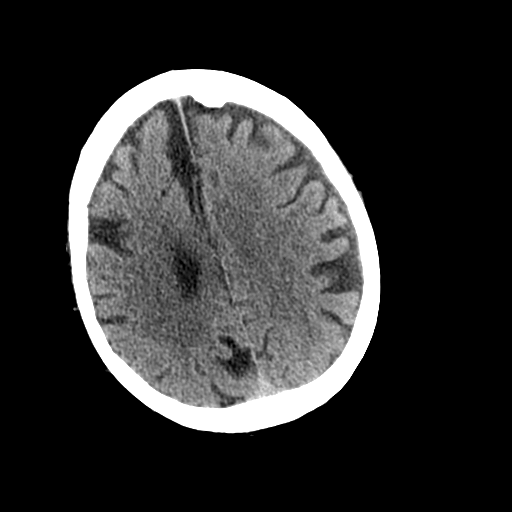
[im 24/34  brain]
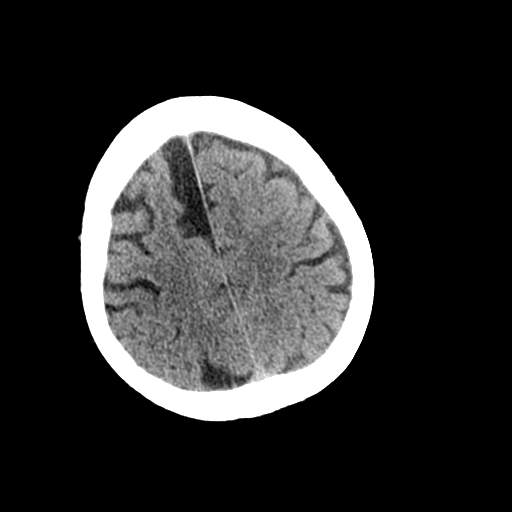
[im 28/34  brain]
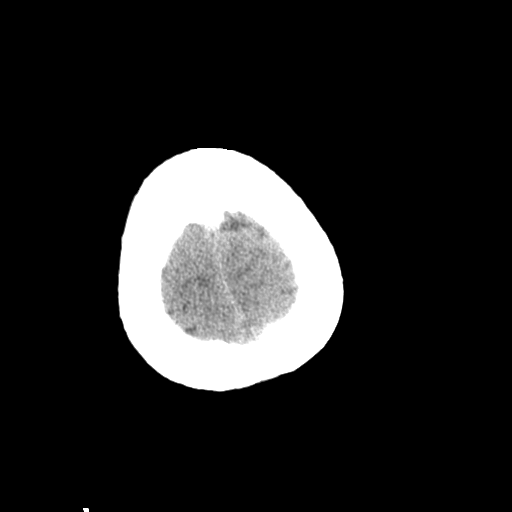
[im 31/34  brain]
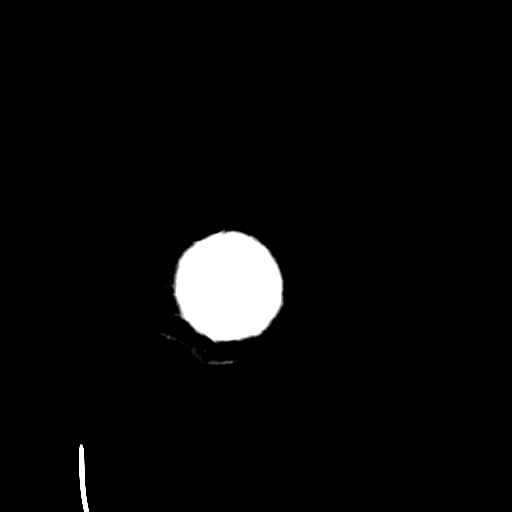
[im 31/34  bone]
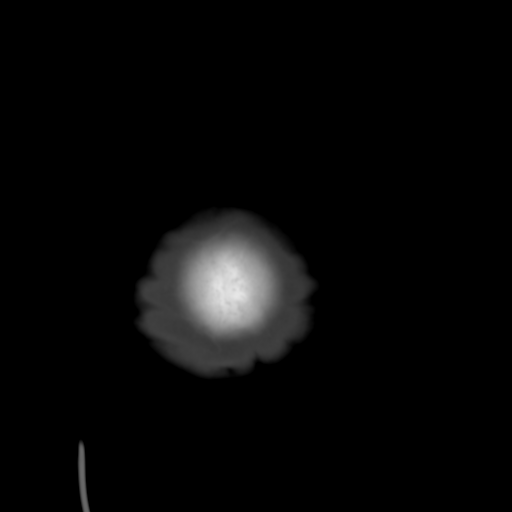

[Series 4: coronal soft · coronal · 0.37mm/px · 3 of 77 slices shown]
[im 26/77  brain]
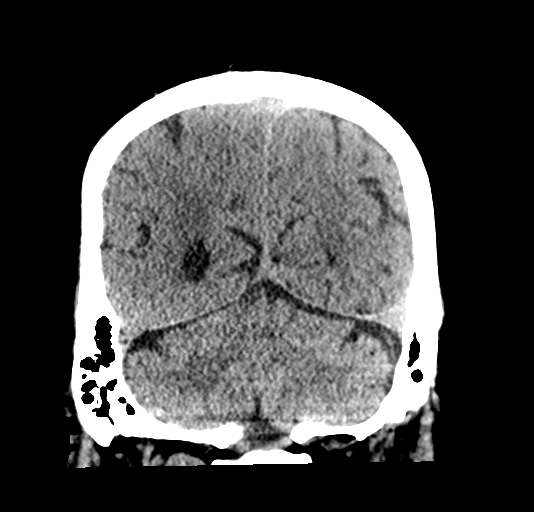
[im 34/77  brain]
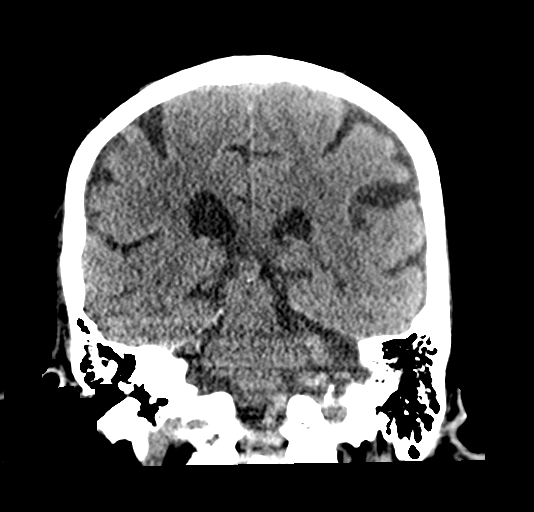
[im 43/77  brain]
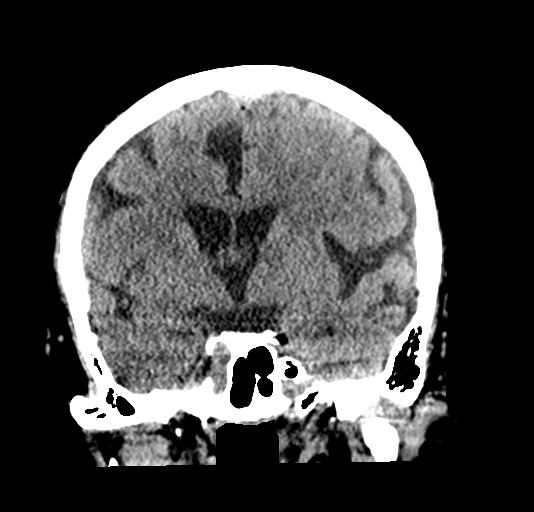

[Series 5: sagittal soft · sagittal · 0.38mm/px · 3 of 62 slices shown]
[im 21/62  brain]
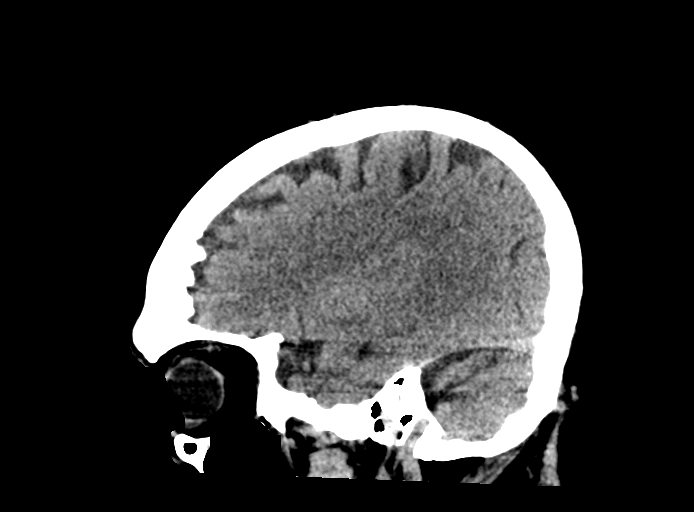
[im 31/62  brain]
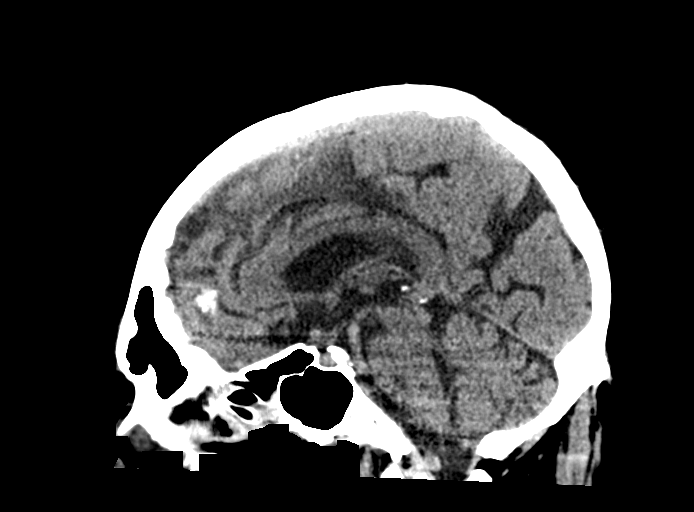
[im 41/62  brain]
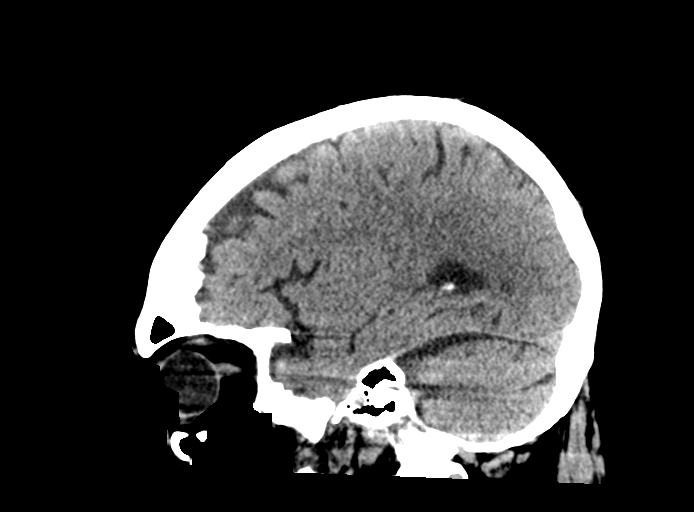

[15 of 47 positions shown; findings below may reference images not displayed]

FINDINGS: Brain: No acute territorial infarction, hemorrhage, or intracranial
mass is visualized. Slightly enlarged extra-axial CSF spaces at the
right parasagittal frontal lobe and at the left cranial convexity,
probably representing chronic subdural collections. Moderate
atrophy. Mild chronic small vessel ischemic changes of the white
matter. The ventricles are nonenlarged

Vascular: No hyperdense vessels.  Carotid vascular calcification.

Skull: Normal. Negative for fracture or focal lesion.

Sinuses/Orbits: Mild mucosal thickening in the sinuses

Other: None
IMPRESSION: 1. No definite CT evidence for acute intracranial abnormality.
2. Atrophy and chronic small vessel ischemic changes of the white
matter. Slightly asymmetric enlarged extra-axial CSF spaces
bilaterally suggestive of chronic subdural collections.

## 2022-05-17 DIAGNOSIS — G629 Polyneuropathy, unspecified: Secondary | ICD-10-CM | POA: Diagnosis not present

## 2022-05-17 DIAGNOSIS — I482 Chronic atrial fibrillation, unspecified: Secondary | ICD-10-CM | POA: Diagnosis not present

## 2022-05-17 DIAGNOSIS — Z79899 Other long term (current) drug therapy: Secondary | ICD-10-CM | POA: Diagnosis not present

## 2022-05-17 DIAGNOSIS — E1140 Type 2 diabetes mellitus with diabetic neuropathy, unspecified: Secondary | ICD-10-CM | POA: Diagnosis not present

## 2022-05-24 DIAGNOSIS — E1140 Type 2 diabetes mellitus with diabetic neuropathy, unspecified: Secondary | ICD-10-CM | POA: Diagnosis not present

## 2022-05-24 DIAGNOSIS — G629 Polyneuropathy, unspecified: Secondary | ICD-10-CM | POA: Diagnosis not present

## 2022-05-24 DIAGNOSIS — G4700 Insomnia, unspecified: Secondary | ICD-10-CM | POA: Diagnosis not present

## 2022-05-29 DIAGNOSIS — E1142 Type 2 diabetes mellitus with diabetic polyneuropathy: Secondary | ICD-10-CM | POA: Diagnosis not present

## 2022-05-29 DIAGNOSIS — G9009 Other idiopathic peripheral autonomic neuropathy: Secondary | ICD-10-CM | POA: Diagnosis not present

## 2022-05-29 DIAGNOSIS — R269 Unspecified abnormalities of gait and mobility: Secondary | ICD-10-CM | POA: Diagnosis not present

## 2022-06-29 DIAGNOSIS — G9009 Other idiopathic peripheral autonomic neuropathy: Secondary | ICD-10-CM | POA: Diagnosis not present

## 2022-06-29 DIAGNOSIS — R269 Unspecified abnormalities of gait and mobility: Secondary | ICD-10-CM | POA: Diagnosis not present

## 2022-06-29 DIAGNOSIS — E1142 Type 2 diabetes mellitus with diabetic polyneuropathy: Secondary | ICD-10-CM | POA: Diagnosis not present

## 2022-07-28 DIAGNOSIS — G9009 Other idiopathic peripheral autonomic neuropathy: Secondary | ICD-10-CM | POA: Diagnosis not present

## 2022-07-28 DIAGNOSIS — R269 Unspecified abnormalities of gait and mobility: Secondary | ICD-10-CM | POA: Diagnosis not present

## 2022-07-28 DIAGNOSIS — E1142 Type 2 diabetes mellitus with diabetic polyneuropathy: Secondary | ICD-10-CM | POA: Diagnosis not present

## 2022-10-06 DIAGNOSIS — E1140 Type 2 diabetes mellitus with diabetic neuropathy, unspecified: Secondary | ICD-10-CM | POA: Diagnosis not present

## 2022-10-13 DIAGNOSIS — G629 Polyneuropathy, unspecified: Secondary | ICD-10-CM | POA: Diagnosis not present

## 2022-10-13 DIAGNOSIS — E1140 Type 2 diabetes mellitus with diabetic neuropathy, unspecified: Secondary | ICD-10-CM | POA: Diagnosis not present

## 2022-11-25 DIAGNOSIS — G629 Polyneuropathy, unspecified: Secondary | ICD-10-CM | POA: Diagnosis not present

## 2022-11-25 DIAGNOSIS — R269 Unspecified abnormalities of gait and mobility: Secondary | ICD-10-CM | POA: Diagnosis not present

## 2022-11-26 DIAGNOSIS — Z7985 Long-term (current) use of injectable non-insulin antidiabetic drugs: Secondary | ICD-10-CM | POA: Diagnosis not present

## 2022-11-26 DIAGNOSIS — Z556 Problems related to health literacy: Secondary | ICD-10-CM | POA: Diagnosis not present

## 2022-11-26 DIAGNOSIS — E669 Obesity, unspecified: Secondary | ICD-10-CM | POA: Diagnosis not present

## 2022-11-26 DIAGNOSIS — F32A Depression, unspecified: Secondary | ICD-10-CM | POA: Diagnosis not present

## 2022-11-26 DIAGNOSIS — S30820D Blister (nonthermal) of lower back and pelvis, subsequent encounter: Secondary | ICD-10-CM | POA: Diagnosis not present

## 2022-11-26 DIAGNOSIS — E1142 Type 2 diabetes mellitus with diabetic polyneuropathy: Secondary | ICD-10-CM | POA: Diagnosis not present

## 2022-11-26 DIAGNOSIS — Z6837 Body mass index (BMI) 37.0-37.9, adult: Secondary | ICD-10-CM | POA: Diagnosis not present

## 2022-11-26 DIAGNOSIS — Z7984 Long term (current) use of oral hypoglycemic drugs: Secondary | ICD-10-CM | POA: Diagnosis not present

## 2022-11-26 DIAGNOSIS — Z9181 History of falling: Secondary | ICD-10-CM | POA: Diagnosis not present

## 2022-11-30 DIAGNOSIS — F32A Depression, unspecified: Secondary | ICD-10-CM | POA: Diagnosis not present

## 2022-11-30 DIAGNOSIS — E1142 Type 2 diabetes mellitus with diabetic polyneuropathy: Secondary | ICD-10-CM | POA: Diagnosis not present

## 2022-11-30 DIAGNOSIS — Z9181 History of falling: Secondary | ICD-10-CM | POA: Diagnosis not present

## 2022-11-30 DIAGNOSIS — S30820D Blister (nonthermal) of lower back and pelvis, subsequent encounter: Secondary | ICD-10-CM | POA: Diagnosis not present

## 2022-11-30 DIAGNOSIS — Z7984 Long term (current) use of oral hypoglycemic drugs: Secondary | ICD-10-CM | POA: Diagnosis not present

## 2022-11-30 DIAGNOSIS — E669 Obesity, unspecified: Secondary | ICD-10-CM | POA: Diagnosis not present

## 2022-11-30 DIAGNOSIS — Z6837 Body mass index (BMI) 37.0-37.9, adult: Secondary | ICD-10-CM | POA: Diagnosis not present

## 2022-11-30 DIAGNOSIS — Z7985 Long-term (current) use of injectable non-insulin antidiabetic drugs: Secondary | ICD-10-CM | POA: Diagnosis not present

## 2022-11-30 DIAGNOSIS — Z556 Problems related to health literacy: Secondary | ICD-10-CM | POA: Diagnosis not present

## 2022-12-02 DIAGNOSIS — Z6837 Body mass index (BMI) 37.0-37.9, adult: Secondary | ICD-10-CM | POA: Diagnosis not present

## 2022-12-02 DIAGNOSIS — F32A Depression, unspecified: Secondary | ICD-10-CM | POA: Diagnosis not present

## 2022-12-02 DIAGNOSIS — E1142 Type 2 diabetes mellitus with diabetic polyneuropathy: Secondary | ICD-10-CM | POA: Diagnosis not present

## 2022-12-02 DIAGNOSIS — Z556 Problems related to health literacy: Secondary | ICD-10-CM | POA: Diagnosis not present

## 2022-12-02 DIAGNOSIS — Z7985 Long-term (current) use of injectable non-insulin antidiabetic drugs: Secondary | ICD-10-CM | POA: Diagnosis not present

## 2022-12-02 DIAGNOSIS — Z7984 Long term (current) use of oral hypoglycemic drugs: Secondary | ICD-10-CM | POA: Diagnosis not present

## 2022-12-02 DIAGNOSIS — Z9181 History of falling: Secondary | ICD-10-CM | POA: Diagnosis not present

## 2022-12-02 DIAGNOSIS — E669 Obesity, unspecified: Secondary | ICD-10-CM | POA: Diagnosis not present

## 2022-12-02 DIAGNOSIS — S30820D Blister (nonthermal) of lower back and pelvis, subsequent encounter: Secondary | ICD-10-CM | POA: Diagnosis not present

## 2022-12-06 DIAGNOSIS — Z7985 Long-term (current) use of injectable non-insulin antidiabetic drugs: Secondary | ICD-10-CM | POA: Diagnosis not present

## 2022-12-06 DIAGNOSIS — Z9181 History of falling: Secondary | ICD-10-CM | POA: Diagnosis not present

## 2022-12-06 DIAGNOSIS — F32A Depression, unspecified: Secondary | ICD-10-CM | POA: Diagnosis not present

## 2022-12-06 DIAGNOSIS — Z556 Problems related to health literacy: Secondary | ICD-10-CM | POA: Diagnosis not present

## 2022-12-06 DIAGNOSIS — Z7984 Long term (current) use of oral hypoglycemic drugs: Secondary | ICD-10-CM | POA: Diagnosis not present

## 2022-12-06 DIAGNOSIS — Z6837 Body mass index (BMI) 37.0-37.9, adult: Secondary | ICD-10-CM | POA: Diagnosis not present

## 2022-12-06 DIAGNOSIS — S30820D Blister (nonthermal) of lower back and pelvis, subsequent encounter: Secondary | ICD-10-CM | POA: Diagnosis not present

## 2022-12-06 DIAGNOSIS — E669 Obesity, unspecified: Secondary | ICD-10-CM | POA: Diagnosis not present

## 2022-12-06 DIAGNOSIS — E1142 Type 2 diabetes mellitus with diabetic polyneuropathy: Secondary | ICD-10-CM | POA: Diagnosis not present

## 2022-12-07 DIAGNOSIS — E1142 Type 2 diabetes mellitus with diabetic polyneuropathy: Secondary | ICD-10-CM | POA: Diagnosis not present

## 2022-12-07 DIAGNOSIS — Z7985 Long-term (current) use of injectable non-insulin antidiabetic drugs: Secondary | ICD-10-CM | POA: Diagnosis not present

## 2022-12-07 DIAGNOSIS — Z6837 Body mass index (BMI) 37.0-37.9, adult: Secondary | ICD-10-CM | POA: Diagnosis not present

## 2022-12-07 DIAGNOSIS — Z7984 Long term (current) use of oral hypoglycemic drugs: Secondary | ICD-10-CM | POA: Diagnosis not present

## 2022-12-07 DIAGNOSIS — E669 Obesity, unspecified: Secondary | ICD-10-CM | POA: Diagnosis not present

## 2022-12-07 DIAGNOSIS — S30820D Blister (nonthermal) of lower back and pelvis, subsequent encounter: Secondary | ICD-10-CM | POA: Diagnosis not present

## 2022-12-07 DIAGNOSIS — Z556 Problems related to health literacy: Secondary | ICD-10-CM | POA: Diagnosis not present

## 2022-12-07 DIAGNOSIS — F32A Depression, unspecified: Secondary | ICD-10-CM | POA: Diagnosis not present

## 2022-12-07 DIAGNOSIS — Z9181 History of falling: Secondary | ICD-10-CM | POA: Diagnosis not present

## 2022-12-09 DIAGNOSIS — F32A Depression, unspecified: Secondary | ICD-10-CM | POA: Diagnosis not present

## 2022-12-09 DIAGNOSIS — E1142 Type 2 diabetes mellitus with diabetic polyneuropathy: Secondary | ICD-10-CM | POA: Diagnosis not present

## 2022-12-09 DIAGNOSIS — Z7985 Long-term (current) use of injectable non-insulin antidiabetic drugs: Secondary | ICD-10-CM | POA: Diagnosis not present

## 2022-12-09 DIAGNOSIS — Z6837 Body mass index (BMI) 37.0-37.9, adult: Secondary | ICD-10-CM | POA: Diagnosis not present

## 2022-12-09 DIAGNOSIS — S30820D Blister (nonthermal) of lower back and pelvis, subsequent encounter: Secondary | ICD-10-CM | POA: Diagnosis not present

## 2022-12-09 DIAGNOSIS — Z7984 Long term (current) use of oral hypoglycemic drugs: Secondary | ICD-10-CM | POA: Diagnosis not present

## 2022-12-09 DIAGNOSIS — Z556 Problems related to health literacy: Secondary | ICD-10-CM | POA: Diagnosis not present

## 2022-12-09 DIAGNOSIS — E669 Obesity, unspecified: Secondary | ICD-10-CM | POA: Diagnosis not present

## 2022-12-09 DIAGNOSIS — Z9181 History of falling: Secondary | ICD-10-CM | POA: Diagnosis not present

## 2022-12-12 DIAGNOSIS — S30820D Blister (nonthermal) of lower back and pelvis, subsequent encounter: Secondary | ICD-10-CM | POA: Diagnosis not present

## 2022-12-12 DIAGNOSIS — F32A Depression, unspecified: Secondary | ICD-10-CM | POA: Diagnosis not present

## 2022-12-12 DIAGNOSIS — Z556 Problems related to health literacy: Secondary | ICD-10-CM | POA: Diagnosis not present

## 2022-12-12 DIAGNOSIS — Z6837 Body mass index (BMI) 37.0-37.9, adult: Secondary | ICD-10-CM | POA: Diagnosis not present

## 2022-12-12 DIAGNOSIS — E669 Obesity, unspecified: Secondary | ICD-10-CM | POA: Diagnosis not present

## 2022-12-12 DIAGNOSIS — Z7984 Long term (current) use of oral hypoglycemic drugs: Secondary | ICD-10-CM | POA: Diagnosis not present

## 2022-12-12 DIAGNOSIS — E1142 Type 2 diabetes mellitus with diabetic polyneuropathy: Secondary | ICD-10-CM | POA: Diagnosis not present

## 2022-12-12 DIAGNOSIS — Z7985 Long-term (current) use of injectable non-insulin antidiabetic drugs: Secondary | ICD-10-CM | POA: Diagnosis not present

## 2022-12-12 DIAGNOSIS — Z9181 History of falling: Secondary | ICD-10-CM | POA: Diagnosis not present

## 2022-12-14 DIAGNOSIS — S30820D Blister (nonthermal) of lower back and pelvis, subsequent encounter: Secondary | ICD-10-CM | POA: Diagnosis not present

## 2022-12-14 DIAGNOSIS — E669 Obesity, unspecified: Secondary | ICD-10-CM | POA: Diagnosis not present

## 2022-12-14 DIAGNOSIS — Z6837 Body mass index (BMI) 37.0-37.9, adult: Secondary | ICD-10-CM | POA: Diagnosis not present

## 2022-12-14 DIAGNOSIS — Z7985 Long-term (current) use of injectable non-insulin antidiabetic drugs: Secondary | ICD-10-CM | POA: Diagnosis not present

## 2022-12-14 DIAGNOSIS — Z9181 History of falling: Secondary | ICD-10-CM | POA: Diagnosis not present

## 2022-12-14 DIAGNOSIS — F32A Depression, unspecified: Secondary | ICD-10-CM | POA: Diagnosis not present

## 2022-12-14 DIAGNOSIS — Z7984 Long term (current) use of oral hypoglycemic drugs: Secondary | ICD-10-CM | POA: Diagnosis not present

## 2022-12-14 DIAGNOSIS — Z556 Problems related to health literacy: Secondary | ICD-10-CM | POA: Diagnosis not present

## 2022-12-14 DIAGNOSIS — E1142 Type 2 diabetes mellitus with diabetic polyneuropathy: Secondary | ICD-10-CM | POA: Diagnosis not present

## 2022-12-15 DIAGNOSIS — F32A Depression, unspecified: Secondary | ICD-10-CM | POA: Diagnosis not present

## 2022-12-15 DIAGNOSIS — E1142 Type 2 diabetes mellitus with diabetic polyneuropathy: Secondary | ICD-10-CM | POA: Diagnosis not present

## 2022-12-15 DIAGNOSIS — E669 Obesity, unspecified: Secondary | ICD-10-CM | POA: Diagnosis not present

## 2022-12-15 DIAGNOSIS — S30820D Blister (nonthermal) of lower back and pelvis, subsequent encounter: Secondary | ICD-10-CM | POA: Diagnosis not present

## 2022-12-15 DIAGNOSIS — Z6837 Body mass index (BMI) 37.0-37.9, adult: Secondary | ICD-10-CM | POA: Diagnosis not present

## 2022-12-15 DIAGNOSIS — Z7984 Long term (current) use of oral hypoglycemic drugs: Secondary | ICD-10-CM | POA: Diagnosis not present

## 2022-12-15 DIAGNOSIS — Z7985 Long-term (current) use of injectable non-insulin antidiabetic drugs: Secondary | ICD-10-CM | POA: Diagnosis not present

## 2022-12-15 DIAGNOSIS — Z556 Problems related to health literacy: Secondary | ICD-10-CM | POA: Diagnosis not present

## 2022-12-15 DIAGNOSIS — Z9181 History of falling: Secondary | ICD-10-CM | POA: Diagnosis not present

## 2022-12-19 DIAGNOSIS — E669 Obesity, unspecified: Secondary | ICD-10-CM | POA: Diagnosis not present

## 2022-12-19 DIAGNOSIS — Z7984 Long term (current) use of oral hypoglycemic drugs: Secondary | ICD-10-CM | POA: Diagnosis not present

## 2022-12-19 DIAGNOSIS — Z6837 Body mass index (BMI) 37.0-37.9, adult: Secondary | ICD-10-CM | POA: Diagnosis not present

## 2022-12-19 DIAGNOSIS — E1142 Type 2 diabetes mellitus with diabetic polyneuropathy: Secondary | ICD-10-CM | POA: Diagnosis not present

## 2022-12-19 DIAGNOSIS — Z556 Problems related to health literacy: Secondary | ICD-10-CM | POA: Diagnosis not present

## 2022-12-19 DIAGNOSIS — Z7985 Long-term (current) use of injectable non-insulin antidiabetic drugs: Secondary | ICD-10-CM | POA: Diagnosis not present

## 2022-12-19 DIAGNOSIS — Z9181 History of falling: Secondary | ICD-10-CM | POA: Diagnosis not present

## 2022-12-19 DIAGNOSIS — F32A Depression, unspecified: Secondary | ICD-10-CM | POA: Diagnosis not present

## 2022-12-19 DIAGNOSIS — S30820D Blister (nonthermal) of lower back and pelvis, subsequent encounter: Secondary | ICD-10-CM | POA: Diagnosis not present

## 2022-12-20 DIAGNOSIS — E1142 Type 2 diabetes mellitus with diabetic polyneuropathy: Secondary | ICD-10-CM | POA: Diagnosis not present

## 2022-12-20 DIAGNOSIS — F32A Depression, unspecified: Secondary | ICD-10-CM | POA: Diagnosis not present

## 2022-12-20 DIAGNOSIS — E669 Obesity, unspecified: Secondary | ICD-10-CM | POA: Diagnosis not present

## 2022-12-20 DIAGNOSIS — Z7985 Long-term (current) use of injectable non-insulin antidiabetic drugs: Secondary | ICD-10-CM | POA: Diagnosis not present

## 2022-12-20 DIAGNOSIS — S30820D Blister (nonthermal) of lower back and pelvis, subsequent encounter: Secondary | ICD-10-CM | POA: Diagnosis not present

## 2022-12-20 DIAGNOSIS — Z9181 History of falling: Secondary | ICD-10-CM | POA: Diagnosis not present

## 2022-12-20 DIAGNOSIS — Z556 Problems related to health literacy: Secondary | ICD-10-CM | POA: Diagnosis not present

## 2022-12-20 DIAGNOSIS — Z6837 Body mass index (BMI) 37.0-37.9, adult: Secondary | ICD-10-CM | POA: Diagnosis not present

## 2022-12-20 DIAGNOSIS — Z7984 Long term (current) use of oral hypoglycemic drugs: Secondary | ICD-10-CM | POA: Diagnosis not present

## 2022-12-21 DIAGNOSIS — Z7985 Long-term (current) use of injectable non-insulin antidiabetic drugs: Secondary | ICD-10-CM | POA: Diagnosis not present

## 2022-12-21 DIAGNOSIS — F32A Depression, unspecified: Secondary | ICD-10-CM | POA: Diagnosis not present

## 2022-12-21 DIAGNOSIS — Z9181 History of falling: Secondary | ICD-10-CM | POA: Diagnosis not present

## 2022-12-21 DIAGNOSIS — S30820D Blister (nonthermal) of lower back and pelvis, subsequent encounter: Secondary | ICD-10-CM | POA: Diagnosis not present

## 2022-12-21 DIAGNOSIS — E669 Obesity, unspecified: Secondary | ICD-10-CM | POA: Diagnosis not present

## 2022-12-21 DIAGNOSIS — E1142 Type 2 diabetes mellitus with diabetic polyneuropathy: Secondary | ICD-10-CM | POA: Diagnosis not present

## 2022-12-21 DIAGNOSIS — Z556 Problems related to health literacy: Secondary | ICD-10-CM | POA: Diagnosis not present

## 2022-12-21 DIAGNOSIS — Z6837 Body mass index (BMI) 37.0-37.9, adult: Secondary | ICD-10-CM | POA: Diagnosis not present

## 2022-12-21 DIAGNOSIS — Z7984 Long term (current) use of oral hypoglycemic drugs: Secondary | ICD-10-CM | POA: Diagnosis not present

## 2022-12-27 DIAGNOSIS — E669 Obesity, unspecified: Secondary | ICD-10-CM | POA: Diagnosis not present

## 2022-12-27 DIAGNOSIS — Z7985 Long-term (current) use of injectable non-insulin antidiabetic drugs: Secondary | ICD-10-CM | POA: Diagnosis not present

## 2022-12-27 DIAGNOSIS — F32A Depression, unspecified: Secondary | ICD-10-CM | POA: Diagnosis not present

## 2022-12-27 DIAGNOSIS — Z6837 Body mass index (BMI) 37.0-37.9, adult: Secondary | ICD-10-CM | POA: Diagnosis not present

## 2022-12-27 DIAGNOSIS — Z556 Problems related to health literacy: Secondary | ICD-10-CM | POA: Diagnosis not present

## 2022-12-27 DIAGNOSIS — S30820D Blister (nonthermal) of lower back and pelvis, subsequent encounter: Secondary | ICD-10-CM | POA: Diagnosis not present

## 2022-12-27 DIAGNOSIS — Z7984 Long term (current) use of oral hypoglycemic drugs: Secondary | ICD-10-CM | POA: Diagnosis not present

## 2022-12-27 DIAGNOSIS — E1142 Type 2 diabetes mellitus with diabetic polyneuropathy: Secondary | ICD-10-CM | POA: Diagnosis not present

## 2022-12-27 DIAGNOSIS — Z9181 History of falling: Secondary | ICD-10-CM | POA: Diagnosis not present

## 2022-12-28 DIAGNOSIS — Z556 Problems related to health literacy: Secondary | ICD-10-CM | POA: Diagnosis not present

## 2022-12-28 DIAGNOSIS — Z7984 Long term (current) use of oral hypoglycemic drugs: Secondary | ICD-10-CM | POA: Diagnosis not present

## 2022-12-28 DIAGNOSIS — Z7985 Long-term (current) use of injectable non-insulin antidiabetic drugs: Secondary | ICD-10-CM | POA: Diagnosis not present

## 2022-12-28 DIAGNOSIS — S30820D Blister (nonthermal) of lower back and pelvis, subsequent encounter: Secondary | ICD-10-CM | POA: Diagnosis not present

## 2022-12-28 DIAGNOSIS — E669 Obesity, unspecified: Secondary | ICD-10-CM | POA: Diagnosis not present

## 2022-12-28 DIAGNOSIS — F32A Depression, unspecified: Secondary | ICD-10-CM | POA: Diagnosis not present

## 2022-12-28 DIAGNOSIS — Z9181 History of falling: Secondary | ICD-10-CM | POA: Diagnosis not present

## 2022-12-28 DIAGNOSIS — Z6837 Body mass index (BMI) 37.0-37.9, adult: Secondary | ICD-10-CM | POA: Diagnosis not present

## 2022-12-28 DIAGNOSIS — E1142 Type 2 diabetes mellitus with diabetic polyneuropathy: Secondary | ICD-10-CM | POA: Diagnosis not present

## 2022-12-30 DIAGNOSIS — E669 Obesity, unspecified: Secondary | ICD-10-CM | POA: Diagnosis not present

## 2022-12-30 DIAGNOSIS — F32A Depression, unspecified: Secondary | ICD-10-CM | POA: Diagnosis not present

## 2022-12-30 DIAGNOSIS — Z7984 Long term (current) use of oral hypoglycemic drugs: Secondary | ICD-10-CM | POA: Diagnosis not present

## 2022-12-30 DIAGNOSIS — E1142 Type 2 diabetes mellitus with diabetic polyneuropathy: Secondary | ICD-10-CM | POA: Diagnosis not present

## 2022-12-30 DIAGNOSIS — Z556 Problems related to health literacy: Secondary | ICD-10-CM | POA: Diagnosis not present

## 2022-12-30 DIAGNOSIS — Z9181 History of falling: Secondary | ICD-10-CM | POA: Diagnosis not present

## 2022-12-30 DIAGNOSIS — S30820D Blister (nonthermal) of lower back and pelvis, subsequent encounter: Secondary | ICD-10-CM | POA: Diagnosis not present

## 2022-12-30 DIAGNOSIS — Z6837 Body mass index (BMI) 37.0-37.9, adult: Secondary | ICD-10-CM | POA: Diagnosis not present

## 2022-12-30 DIAGNOSIS — Z7985 Long-term (current) use of injectable non-insulin antidiabetic drugs: Secondary | ICD-10-CM | POA: Diagnosis not present

## 2023-01-03 DIAGNOSIS — S30820D Blister (nonthermal) of lower back and pelvis, subsequent encounter: Secondary | ICD-10-CM | POA: Diagnosis not present

## 2023-01-03 DIAGNOSIS — F32A Depression, unspecified: Secondary | ICD-10-CM | POA: Diagnosis not present

## 2023-01-03 DIAGNOSIS — E669 Obesity, unspecified: Secondary | ICD-10-CM | POA: Diagnosis not present

## 2023-01-03 DIAGNOSIS — Z556 Problems related to health literacy: Secondary | ICD-10-CM | POA: Diagnosis not present

## 2023-01-03 DIAGNOSIS — Z6837 Body mass index (BMI) 37.0-37.9, adult: Secondary | ICD-10-CM | POA: Diagnosis not present

## 2023-01-03 DIAGNOSIS — Z9181 History of falling: Secondary | ICD-10-CM | POA: Diagnosis not present

## 2023-01-03 DIAGNOSIS — Z7985 Long-term (current) use of injectable non-insulin antidiabetic drugs: Secondary | ICD-10-CM | POA: Diagnosis not present

## 2023-01-03 DIAGNOSIS — Z7984 Long term (current) use of oral hypoglycemic drugs: Secondary | ICD-10-CM | POA: Diagnosis not present

## 2023-01-03 DIAGNOSIS — E1142 Type 2 diabetes mellitus with diabetic polyneuropathy: Secondary | ICD-10-CM | POA: Diagnosis not present

## 2023-01-11 DIAGNOSIS — F32A Depression, unspecified: Secondary | ICD-10-CM | POA: Diagnosis not present

## 2023-01-11 DIAGNOSIS — Z7984 Long term (current) use of oral hypoglycemic drugs: Secondary | ICD-10-CM | POA: Diagnosis not present

## 2023-01-11 DIAGNOSIS — Z6837 Body mass index (BMI) 37.0-37.9, adult: Secondary | ICD-10-CM | POA: Diagnosis not present

## 2023-01-11 DIAGNOSIS — Z9181 History of falling: Secondary | ICD-10-CM | POA: Diagnosis not present

## 2023-01-11 DIAGNOSIS — Z556 Problems related to health literacy: Secondary | ICD-10-CM | POA: Diagnosis not present

## 2023-01-11 DIAGNOSIS — E1142 Type 2 diabetes mellitus with diabetic polyneuropathy: Secondary | ICD-10-CM | POA: Diagnosis not present

## 2023-01-11 DIAGNOSIS — S30820D Blister (nonthermal) of lower back and pelvis, subsequent encounter: Secondary | ICD-10-CM | POA: Diagnosis not present

## 2023-01-11 DIAGNOSIS — E669 Obesity, unspecified: Secondary | ICD-10-CM | POA: Diagnosis not present

## 2023-01-11 DIAGNOSIS — Z7985 Long-term (current) use of injectable non-insulin antidiabetic drugs: Secondary | ICD-10-CM | POA: Diagnosis not present

## 2023-01-12 DIAGNOSIS — Z9181 History of falling: Secondary | ICD-10-CM | POA: Diagnosis not present

## 2023-01-12 DIAGNOSIS — S30820D Blister (nonthermal) of lower back and pelvis, subsequent encounter: Secondary | ICD-10-CM | POA: Diagnosis not present

## 2023-01-12 DIAGNOSIS — Z556 Problems related to health literacy: Secondary | ICD-10-CM | POA: Diagnosis not present

## 2023-01-12 DIAGNOSIS — E669 Obesity, unspecified: Secondary | ICD-10-CM | POA: Diagnosis not present

## 2023-01-12 DIAGNOSIS — Z7985 Long-term (current) use of injectable non-insulin antidiabetic drugs: Secondary | ICD-10-CM | POA: Diagnosis not present

## 2023-01-12 DIAGNOSIS — F32A Depression, unspecified: Secondary | ICD-10-CM | POA: Diagnosis not present

## 2023-01-12 DIAGNOSIS — E1142 Type 2 diabetes mellitus with diabetic polyneuropathy: Secondary | ICD-10-CM | POA: Diagnosis not present

## 2023-01-12 DIAGNOSIS — Z6837 Body mass index (BMI) 37.0-37.9, adult: Secondary | ICD-10-CM | POA: Diagnosis not present

## 2023-01-12 DIAGNOSIS — Z7984 Long term (current) use of oral hypoglycemic drugs: Secondary | ICD-10-CM | POA: Diagnosis not present

## 2023-01-18 DIAGNOSIS — Z7985 Long-term (current) use of injectable non-insulin antidiabetic drugs: Secondary | ICD-10-CM | POA: Diagnosis not present

## 2023-01-18 DIAGNOSIS — E1142 Type 2 diabetes mellitus with diabetic polyneuropathy: Secondary | ICD-10-CM | POA: Diagnosis not present

## 2023-01-18 DIAGNOSIS — Z6837 Body mass index (BMI) 37.0-37.9, adult: Secondary | ICD-10-CM | POA: Diagnosis not present

## 2023-01-18 DIAGNOSIS — E669 Obesity, unspecified: Secondary | ICD-10-CM | POA: Diagnosis not present

## 2023-01-18 DIAGNOSIS — Z9181 History of falling: Secondary | ICD-10-CM | POA: Diagnosis not present

## 2023-01-18 DIAGNOSIS — F32A Depression, unspecified: Secondary | ICD-10-CM | POA: Diagnosis not present

## 2023-01-18 DIAGNOSIS — S30820D Blister (nonthermal) of lower back and pelvis, subsequent encounter: Secondary | ICD-10-CM | POA: Diagnosis not present

## 2023-01-18 DIAGNOSIS — Z556 Problems related to health literacy: Secondary | ICD-10-CM | POA: Diagnosis not present

## 2023-01-18 DIAGNOSIS — Z7984 Long term (current) use of oral hypoglycemic drugs: Secondary | ICD-10-CM | POA: Diagnosis not present

## 2023-04-13 DIAGNOSIS — Z79899 Other long term (current) drug therapy: Secondary | ICD-10-CM | POA: Diagnosis not present

## 2023-04-13 DIAGNOSIS — I482 Chronic atrial fibrillation, unspecified: Secondary | ICD-10-CM | POA: Diagnosis not present

## 2023-04-13 DIAGNOSIS — G600 Hereditary motor and sensory neuropathy: Secondary | ICD-10-CM | POA: Diagnosis not present

## 2023-04-13 DIAGNOSIS — E1140 Type 2 diabetes mellitus with diabetic neuropathy, unspecified: Secondary | ICD-10-CM | POA: Diagnosis not present

## 2023-04-20 DIAGNOSIS — E1140 Type 2 diabetes mellitus with diabetic neuropathy, unspecified: Secondary | ICD-10-CM | POA: Diagnosis not present

## 2023-04-20 DIAGNOSIS — G629 Polyneuropathy, unspecified: Secondary | ICD-10-CM | POA: Diagnosis not present

## 2023-04-20 DIAGNOSIS — R310 Gross hematuria: Secondary | ICD-10-CM | POA: Diagnosis not present

## 2023-04-27 DIAGNOSIS — R310 Gross hematuria: Secondary | ICD-10-CM | POA: Diagnosis not present

## 2023-05-04 ENCOUNTER — Other Ambulatory Visit (HOSPITAL_COMMUNITY): Payer: Self-pay | Admitting: Internal Medicine

## 2023-05-04 DIAGNOSIS — R310 Gross hematuria: Secondary | ICD-10-CM

## 2023-05-13 DIAGNOSIS — A4189 Other specified sepsis: Secondary | ICD-10-CM | POA: Diagnosis not present

## 2023-05-13 DIAGNOSIS — J9601 Acute respiratory failure with hypoxia: Secondary | ICD-10-CM | POA: Diagnosis not present

## 2023-05-13 DIAGNOSIS — T421X5S Adverse effect of iminostilbenes, sequela: Secondary | ICD-10-CM | POA: Diagnosis not present

## 2023-05-13 DIAGNOSIS — I491 Atrial premature depolarization: Secondary | ICD-10-CM | POA: Diagnosis not present

## 2023-05-13 DIAGNOSIS — J188 Other pneumonia, unspecified organism: Secondary | ICD-10-CM | POA: Diagnosis not present

## 2023-05-13 DIAGNOSIS — G4700 Insomnia, unspecified: Secondary | ICD-10-CM | POA: Diagnosis not present

## 2023-05-13 DIAGNOSIS — U071 COVID-19: Secondary | ICD-10-CM | POA: Diagnosis not present

## 2023-05-13 DIAGNOSIS — I161 Hypertensive emergency: Secondary | ICD-10-CM | POA: Diagnosis not present

## 2023-05-13 DIAGNOSIS — J189 Pneumonia, unspecified organism: Secondary | ICD-10-CM | POA: Diagnosis not present

## 2023-05-13 DIAGNOSIS — E441 Mild protein-calorie malnutrition: Secondary | ICD-10-CM | POA: Diagnosis not present

## 2023-05-13 DIAGNOSIS — E871 Hypo-osmolality and hyponatremia: Secondary | ICD-10-CM | POA: Diagnosis not present

## 2023-05-13 DIAGNOSIS — E1140 Type 2 diabetes mellitus with diabetic neuropathy, unspecified: Secondary | ICD-10-CM | POA: Diagnosis not present

## 2023-05-13 DIAGNOSIS — E66812 Obesity, class 2: Secondary | ICD-10-CM | POA: Diagnosis not present

## 2023-05-13 DIAGNOSIS — N390 Urinary tract infection, site not specified: Secondary | ICD-10-CM | POA: Diagnosis not present

## 2023-05-13 DIAGNOSIS — I10 Essential (primary) hypertension: Secondary | ICD-10-CM | POA: Diagnosis not present

## 2023-05-13 DIAGNOSIS — I499 Cardiac arrhythmia, unspecified: Secondary | ICD-10-CM | POA: Diagnosis not present

## 2023-05-13 DIAGNOSIS — R2689 Other abnormalities of gait and mobility: Secondary | ICD-10-CM | POA: Diagnosis not present

## 2023-05-13 DIAGNOSIS — N3000 Acute cystitis without hematuria: Secondary | ICD-10-CM | POA: Diagnosis not present

## 2023-05-13 DIAGNOSIS — Z7401 Bed confinement status: Secondary | ICD-10-CM | POA: Diagnosis not present

## 2023-05-13 DIAGNOSIS — J1282 Pneumonia due to coronavirus disease 2019: Secondary | ICD-10-CM | POA: Diagnosis not present

## 2023-05-13 DIAGNOSIS — R000 Tachycardia, unspecified: Secondary | ICD-10-CM | POA: Diagnosis not present

## 2023-05-13 DIAGNOSIS — J9602 Acute respiratory failure with hypercapnia: Secondary | ICD-10-CM | POA: Diagnosis not present

## 2023-05-13 DIAGNOSIS — L8995 Pressure ulcer of unspecified site, unstageable: Secondary | ICD-10-CM | POA: Diagnosis not present

## 2023-05-13 DIAGNOSIS — R0902 Hypoxemia: Secondary | ICD-10-CM | POA: Diagnosis not present

## 2023-05-13 DIAGNOSIS — R079 Chest pain, unspecified: Secondary | ICD-10-CM | POA: Diagnosis not present

## 2023-05-14 DIAGNOSIS — U071 COVID-19: Secondary | ICD-10-CM | POA: Diagnosis not present

## 2023-05-14 DIAGNOSIS — N390 Urinary tract infection, site not specified: Secondary | ICD-10-CM | POA: Diagnosis not present

## 2023-05-14 DIAGNOSIS — J189 Pneumonia, unspecified organism: Secondary | ICD-10-CM | POA: Diagnosis not present

## 2023-05-15 DIAGNOSIS — N390 Urinary tract infection, site not specified: Secondary | ICD-10-CM | POA: Diagnosis not present

## 2023-05-15 DIAGNOSIS — U071 COVID-19: Secondary | ICD-10-CM | POA: Diagnosis not present

## 2023-05-15 DIAGNOSIS — J189 Pneumonia, unspecified organism: Secondary | ICD-10-CM | POA: Diagnosis not present

## 2023-05-16 DIAGNOSIS — K219 Gastro-esophageal reflux disease without esophagitis: Secondary | ICD-10-CM | POA: Diagnosis not present

## 2023-05-16 DIAGNOSIS — E119 Type 2 diabetes mellitus without complications: Secondary | ICD-10-CM | POA: Diagnosis not present

## 2023-05-16 DIAGNOSIS — Z7401 Bed confinement status: Secondary | ICD-10-CM | POA: Diagnosis not present

## 2023-05-16 DIAGNOSIS — L89156 Pressure-induced deep tissue damage of sacral region: Secondary | ICD-10-CM | POA: Diagnosis not present

## 2023-05-16 DIAGNOSIS — M6281 Muscle weakness (generalized): Secondary | ICD-10-CM | POA: Diagnosis not present

## 2023-05-16 DIAGNOSIS — U071 COVID-19: Secondary | ICD-10-CM | POA: Diagnosis not present

## 2023-05-16 DIAGNOSIS — J189 Pneumonia, unspecified organism: Secondary | ICD-10-CM | POA: Diagnosis not present

## 2023-05-16 DIAGNOSIS — G4700 Insomnia, unspecified: Secondary | ICD-10-CM | POA: Diagnosis not present

## 2023-05-16 DIAGNOSIS — R2689 Other abnormalities of gait and mobility: Secondary | ICD-10-CM | POA: Diagnosis not present

## 2023-05-16 DIAGNOSIS — E441 Mild protein-calorie malnutrition: Secondary | ICD-10-CM | POA: Diagnosis not present

## 2023-05-16 DIAGNOSIS — F320 Major depressive disorder, single episode, mild: Secondary | ICD-10-CM | POA: Diagnosis not present

## 2023-05-16 DIAGNOSIS — N390 Urinary tract infection, site not specified: Secondary | ICD-10-CM | POA: Diagnosis not present

## 2023-05-16 DIAGNOSIS — M25561 Pain in right knee: Secondary | ICD-10-CM | POA: Diagnosis not present

## 2023-05-16 DIAGNOSIS — E1140 Type 2 diabetes mellitus with diabetic neuropathy, unspecified: Secondary | ICD-10-CM | POA: Diagnosis not present

## 2023-05-16 DIAGNOSIS — M25562 Pain in left knee: Secondary | ICD-10-CM | POA: Diagnosis not present

## 2023-05-16 DIAGNOSIS — T421X5S Adverse effect of iminostilbenes, sequela: Secondary | ICD-10-CM | POA: Diagnosis not present

## 2023-05-16 DIAGNOSIS — F330 Major depressive disorder, recurrent, mild: Secondary | ICD-10-CM | POA: Diagnosis not present

## 2023-05-16 DIAGNOSIS — E66812 Obesity, class 2: Secondary | ICD-10-CM | POA: Diagnosis not present

## 2023-05-16 DIAGNOSIS — I10 Essential (primary) hypertension: Secondary | ICD-10-CM | POA: Diagnosis not present

## 2023-05-16 DIAGNOSIS — R410 Disorientation, unspecified: Secondary | ICD-10-CM | POA: Diagnosis not present

## 2023-05-17 DIAGNOSIS — M6281 Muscle weakness (generalized): Secondary | ICD-10-CM | POA: Diagnosis not present

## 2023-05-17 DIAGNOSIS — U071 COVID-19: Secondary | ICD-10-CM | POA: Diagnosis not present

## 2023-05-17 DIAGNOSIS — F330 Major depressive disorder, recurrent, mild: Secondary | ICD-10-CM | POA: Diagnosis not present

## 2023-05-17 DIAGNOSIS — E1140 Type 2 diabetes mellitus with diabetic neuropathy, unspecified: Secondary | ICD-10-CM | POA: Diagnosis not present

## 2023-05-17 DIAGNOSIS — E119 Type 2 diabetes mellitus without complications: Secondary | ICD-10-CM | POA: Diagnosis not present

## 2023-05-17 DIAGNOSIS — L89156 Pressure-induced deep tissue damage of sacral region: Secondary | ICD-10-CM | POA: Diagnosis not present

## 2023-05-17 DIAGNOSIS — I10 Essential (primary) hypertension: Secondary | ICD-10-CM | POA: Diagnosis not present

## 2023-05-24 DIAGNOSIS — E1140 Type 2 diabetes mellitus with diabetic neuropathy, unspecified: Secondary | ICD-10-CM | POA: Diagnosis not present

## 2023-05-24 DIAGNOSIS — L89156 Pressure-induced deep tissue damage of sacral region: Secondary | ICD-10-CM | POA: Diagnosis not present

## 2023-05-30 DIAGNOSIS — R410 Disorientation, unspecified: Secondary | ICD-10-CM | POA: Diagnosis not present

## 2023-05-30 DIAGNOSIS — U071 COVID-19: Secondary | ICD-10-CM | POA: Diagnosis not present

## 2023-05-30 DIAGNOSIS — E119 Type 2 diabetes mellitus without complications: Secondary | ICD-10-CM | POA: Diagnosis not present

## 2023-05-30 DIAGNOSIS — M6281 Muscle weakness (generalized): Secondary | ICD-10-CM | POA: Diagnosis not present

## 2023-05-31 DIAGNOSIS — E1140 Type 2 diabetes mellitus with diabetic neuropathy, unspecified: Secondary | ICD-10-CM | POA: Diagnosis not present

## 2023-05-31 DIAGNOSIS — L89156 Pressure-induced deep tissue damage of sacral region: Secondary | ICD-10-CM | POA: Diagnosis not present

## 2023-06-01 DIAGNOSIS — M6281 Muscle weakness (generalized): Secondary | ICD-10-CM | POA: Diagnosis not present

## 2023-06-01 DIAGNOSIS — R410 Disorientation, unspecified: Secondary | ICD-10-CM | POA: Diagnosis not present

## 2023-06-01 DIAGNOSIS — M25561 Pain in right knee: Secondary | ICD-10-CM | POA: Diagnosis not present

## 2023-06-01 DIAGNOSIS — K219 Gastro-esophageal reflux disease without esophagitis: Secondary | ICD-10-CM | POA: Diagnosis not present

## 2023-06-05 ENCOUNTER — Encounter (HOSPITAL_COMMUNITY): Payer: Self-pay

## 2023-06-05 ENCOUNTER — Ambulatory Visit (HOSPITAL_COMMUNITY): Payer: Medicare PPO

## 2023-06-05 DIAGNOSIS — M25562 Pain in left knee: Secondary | ICD-10-CM | POA: Diagnosis not present

## 2023-06-05 DIAGNOSIS — M25561 Pain in right knee: Secondary | ICD-10-CM | POA: Diagnosis not present

## 2023-06-09 DIAGNOSIS — I10 Essential (primary) hypertension: Secondary | ICD-10-CM | POA: Diagnosis not present

## 2023-06-09 DIAGNOSIS — E119 Type 2 diabetes mellitus without complications: Secondary | ICD-10-CM | POA: Diagnosis not present

## 2023-06-09 DIAGNOSIS — U071 COVID-19: Secondary | ICD-10-CM | POA: Diagnosis not present

## 2023-06-09 DIAGNOSIS — M6281 Muscle weakness (generalized): Secondary | ICD-10-CM | POA: Diagnosis not present

## 2023-06-09 DIAGNOSIS — F320 Major depressive disorder, single episode, mild: Secondary | ICD-10-CM | POA: Diagnosis not present

## 2023-06-15 DIAGNOSIS — U071 COVID-19: Secondary | ICD-10-CM | POA: Diagnosis not present

## 2023-06-15 DIAGNOSIS — E1142 Type 2 diabetes mellitus with diabetic polyneuropathy: Secondary | ICD-10-CM | POA: Diagnosis not present

## 2023-06-15 DIAGNOSIS — E66812 Obesity, class 2: Secondary | ICD-10-CM | POA: Diagnosis not present

## 2023-06-15 DIAGNOSIS — G479 Sleep disorder, unspecified: Secondary | ICD-10-CM | POA: Diagnosis not present

## 2023-06-15 DIAGNOSIS — A419 Sepsis, unspecified organism: Secondary | ICD-10-CM | POA: Diagnosis not present

## 2023-06-15 DIAGNOSIS — E109 Type 1 diabetes mellitus without complications: Secondary | ICD-10-CM | POA: Diagnosis not present

## 2023-06-15 DIAGNOSIS — N3000 Acute cystitis without hematuria: Secondary | ICD-10-CM | POA: Diagnosis not present

## 2023-06-15 DIAGNOSIS — I872 Venous insufficiency (chronic) (peripheral): Secondary | ICD-10-CM | POA: Diagnosis not present

## 2023-06-15 DIAGNOSIS — J9601 Acute respiratory failure with hypoxia: Secondary | ICD-10-CM | POA: Diagnosis not present

## 2023-06-15 DIAGNOSIS — I10 Essential (primary) hypertension: Secondary | ICD-10-CM | POA: Diagnosis not present

## 2023-06-15 DIAGNOSIS — G4700 Insomnia, unspecified: Secondary | ICD-10-CM | POA: Diagnosis not present

## 2023-06-15 DIAGNOSIS — F320 Major depressive disorder, single episode, mild: Secondary | ICD-10-CM | POA: Diagnosis not present

## 2023-06-19 DIAGNOSIS — A419 Sepsis, unspecified organism: Secondary | ICD-10-CM | POA: Diagnosis not present

## 2023-06-19 DIAGNOSIS — E66812 Obesity, class 2: Secondary | ICD-10-CM | POA: Diagnosis not present

## 2023-06-19 DIAGNOSIS — G479 Sleep disorder, unspecified: Secondary | ICD-10-CM | POA: Diagnosis not present

## 2023-06-19 DIAGNOSIS — I872 Venous insufficiency (chronic) (peripheral): Secondary | ICD-10-CM | POA: Diagnosis not present

## 2023-06-19 DIAGNOSIS — I10 Essential (primary) hypertension: Secondary | ICD-10-CM | POA: Diagnosis not present

## 2023-06-19 DIAGNOSIS — E1142 Type 2 diabetes mellitus with diabetic polyneuropathy: Secondary | ICD-10-CM | POA: Diagnosis not present

## 2023-06-19 DIAGNOSIS — N3000 Acute cystitis without hematuria: Secondary | ICD-10-CM | POA: Diagnosis not present

## 2023-06-19 DIAGNOSIS — G4700 Insomnia, unspecified: Secondary | ICD-10-CM | POA: Diagnosis not present

## 2023-06-19 DIAGNOSIS — F320 Major depressive disorder, single episode, mild: Secondary | ICD-10-CM | POA: Diagnosis not present

## 2023-06-20 DIAGNOSIS — I10 Essential (primary) hypertension: Secondary | ICD-10-CM | POA: Diagnosis not present

## 2023-06-20 DIAGNOSIS — E66812 Obesity, class 2: Secondary | ICD-10-CM | POA: Diagnosis not present

## 2023-06-20 DIAGNOSIS — I872 Venous insufficiency (chronic) (peripheral): Secondary | ICD-10-CM | POA: Diagnosis not present

## 2023-06-20 DIAGNOSIS — G4700 Insomnia, unspecified: Secondary | ICD-10-CM | POA: Diagnosis not present

## 2023-06-20 DIAGNOSIS — E1142 Type 2 diabetes mellitus with diabetic polyneuropathy: Secondary | ICD-10-CM | POA: Diagnosis not present

## 2023-06-20 DIAGNOSIS — N3000 Acute cystitis without hematuria: Secondary | ICD-10-CM | POA: Diagnosis not present

## 2023-06-20 DIAGNOSIS — G479 Sleep disorder, unspecified: Secondary | ICD-10-CM | POA: Diagnosis not present

## 2023-06-20 DIAGNOSIS — F320 Major depressive disorder, single episode, mild: Secondary | ICD-10-CM | POA: Diagnosis not present

## 2023-06-20 DIAGNOSIS — A419 Sepsis, unspecified organism: Secondary | ICD-10-CM | POA: Diagnosis not present

## 2023-06-26 DIAGNOSIS — I10 Essential (primary) hypertension: Secondary | ICD-10-CM | POA: Diagnosis not present

## 2023-06-26 DIAGNOSIS — F320 Major depressive disorder, single episode, mild: Secondary | ICD-10-CM | POA: Diagnosis not present

## 2023-06-26 DIAGNOSIS — G479 Sleep disorder, unspecified: Secondary | ICD-10-CM | POA: Diagnosis not present

## 2023-06-26 DIAGNOSIS — G4700 Insomnia, unspecified: Secondary | ICD-10-CM | POA: Diagnosis not present

## 2023-06-26 DIAGNOSIS — A419 Sepsis, unspecified organism: Secondary | ICD-10-CM | POA: Diagnosis not present

## 2023-06-26 DIAGNOSIS — N3000 Acute cystitis without hematuria: Secondary | ICD-10-CM | POA: Diagnosis not present

## 2023-06-26 DIAGNOSIS — I872 Venous insufficiency (chronic) (peripheral): Secondary | ICD-10-CM | POA: Diagnosis not present

## 2023-06-26 DIAGNOSIS — E66812 Obesity, class 2: Secondary | ICD-10-CM | POA: Diagnosis not present

## 2023-06-26 DIAGNOSIS — E1142 Type 2 diabetes mellitus with diabetic polyneuropathy: Secondary | ICD-10-CM | POA: Diagnosis not present

## 2023-06-27 DIAGNOSIS — G479 Sleep disorder, unspecified: Secondary | ICD-10-CM | POA: Diagnosis not present

## 2023-06-27 DIAGNOSIS — F320 Major depressive disorder, single episode, mild: Secondary | ICD-10-CM | POA: Diagnosis not present

## 2023-06-27 DIAGNOSIS — N3000 Acute cystitis without hematuria: Secondary | ICD-10-CM | POA: Diagnosis not present

## 2023-06-27 DIAGNOSIS — E1142 Type 2 diabetes mellitus with diabetic polyneuropathy: Secondary | ICD-10-CM | POA: Diagnosis not present

## 2023-06-27 DIAGNOSIS — I10 Essential (primary) hypertension: Secondary | ICD-10-CM | POA: Diagnosis not present

## 2023-06-27 DIAGNOSIS — I872 Venous insufficiency (chronic) (peripheral): Secondary | ICD-10-CM | POA: Diagnosis not present

## 2023-06-27 DIAGNOSIS — E66812 Obesity, class 2: Secondary | ICD-10-CM | POA: Diagnosis not present

## 2023-06-27 DIAGNOSIS — A419 Sepsis, unspecified organism: Secondary | ICD-10-CM | POA: Diagnosis not present

## 2023-06-27 DIAGNOSIS — G4700 Insomnia, unspecified: Secondary | ICD-10-CM | POA: Diagnosis not present

## 2023-06-29 DIAGNOSIS — E1142 Type 2 diabetes mellitus with diabetic polyneuropathy: Secondary | ICD-10-CM | POA: Diagnosis not present

## 2023-06-29 DIAGNOSIS — G479 Sleep disorder, unspecified: Secondary | ICD-10-CM | POA: Diagnosis not present

## 2023-06-29 DIAGNOSIS — E66812 Obesity, class 2: Secondary | ICD-10-CM | POA: Diagnosis not present

## 2023-06-29 DIAGNOSIS — I10 Essential (primary) hypertension: Secondary | ICD-10-CM | POA: Diagnosis not present

## 2023-06-29 DIAGNOSIS — A419 Sepsis, unspecified organism: Secondary | ICD-10-CM | POA: Diagnosis not present

## 2023-06-29 DIAGNOSIS — I872 Venous insufficiency (chronic) (peripheral): Secondary | ICD-10-CM | POA: Diagnosis not present

## 2023-06-29 DIAGNOSIS — F320 Major depressive disorder, single episode, mild: Secondary | ICD-10-CM | POA: Diagnosis not present

## 2023-06-29 DIAGNOSIS — G4700 Insomnia, unspecified: Secondary | ICD-10-CM | POA: Diagnosis not present

## 2023-06-29 DIAGNOSIS — N3000 Acute cystitis without hematuria: Secondary | ICD-10-CM | POA: Diagnosis not present

## 2023-07-05 DIAGNOSIS — A419 Sepsis, unspecified organism: Secondary | ICD-10-CM | POA: Diagnosis not present

## 2023-07-05 DIAGNOSIS — I10 Essential (primary) hypertension: Secondary | ICD-10-CM | POA: Diagnosis not present

## 2023-07-05 DIAGNOSIS — G4700 Insomnia, unspecified: Secondary | ICD-10-CM | POA: Diagnosis not present

## 2023-07-05 DIAGNOSIS — F320 Major depressive disorder, single episode, mild: Secondary | ICD-10-CM | POA: Diagnosis not present

## 2023-07-05 DIAGNOSIS — G479 Sleep disorder, unspecified: Secondary | ICD-10-CM | POA: Diagnosis not present

## 2023-07-05 DIAGNOSIS — N3000 Acute cystitis without hematuria: Secondary | ICD-10-CM | POA: Diagnosis not present

## 2023-07-05 DIAGNOSIS — E66812 Obesity, class 2: Secondary | ICD-10-CM | POA: Diagnosis not present

## 2023-07-05 DIAGNOSIS — I872 Venous insufficiency (chronic) (peripheral): Secondary | ICD-10-CM | POA: Diagnosis not present

## 2023-07-05 DIAGNOSIS — E1142 Type 2 diabetes mellitus with diabetic polyneuropathy: Secondary | ICD-10-CM | POA: Diagnosis not present

## 2023-07-06 DIAGNOSIS — I10 Essential (primary) hypertension: Secondary | ICD-10-CM | POA: Diagnosis not present

## 2023-07-06 DIAGNOSIS — I872 Venous insufficiency (chronic) (peripheral): Secondary | ICD-10-CM | POA: Diagnosis not present

## 2023-07-06 DIAGNOSIS — E66812 Obesity, class 2: Secondary | ICD-10-CM | POA: Diagnosis not present

## 2023-07-06 DIAGNOSIS — G4700 Insomnia, unspecified: Secondary | ICD-10-CM | POA: Diagnosis not present

## 2023-07-06 DIAGNOSIS — A419 Sepsis, unspecified organism: Secondary | ICD-10-CM | POA: Diagnosis not present

## 2023-07-06 DIAGNOSIS — N3000 Acute cystitis without hematuria: Secondary | ICD-10-CM | POA: Diagnosis not present

## 2023-07-06 DIAGNOSIS — F320 Major depressive disorder, single episode, mild: Secondary | ICD-10-CM | POA: Diagnosis not present

## 2023-07-06 DIAGNOSIS — E1142 Type 2 diabetes mellitus with diabetic polyneuropathy: Secondary | ICD-10-CM | POA: Diagnosis not present

## 2023-07-06 DIAGNOSIS — G479 Sleep disorder, unspecified: Secondary | ICD-10-CM | POA: Diagnosis not present

## 2023-07-10 DIAGNOSIS — I10 Essential (primary) hypertension: Secondary | ICD-10-CM | POA: Diagnosis not present

## 2023-07-10 DIAGNOSIS — G4700 Insomnia, unspecified: Secondary | ICD-10-CM | POA: Diagnosis not present

## 2023-07-10 DIAGNOSIS — E66812 Obesity, class 2: Secondary | ICD-10-CM | POA: Diagnosis not present

## 2023-07-10 DIAGNOSIS — I872 Venous insufficiency (chronic) (peripheral): Secondary | ICD-10-CM | POA: Diagnosis not present

## 2023-07-10 DIAGNOSIS — F320 Major depressive disorder, single episode, mild: Secondary | ICD-10-CM | POA: Diagnosis not present

## 2023-07-10 DIAGNOSIS — N3000 Acute cystitis without hematuria: Secondary | ICD-10-CM | POA: Diagnosis not present

## 2023-07-10 DIAGNOSIS — G479 Sleep disorder, unspecified: Secondary | ICD-10-CM | POA: Diagnosis not present

## 2023-07-10 DIAGNOSIS — A419 Sepsis, unspecified organism: Secondary | ICD-10-CM | POA: Diagnosis not present

## 2023-07-10 DIAGNOSIS — E1142 Type 2 diabetes mellitus with diabetic polyneuropathy: Secondary | ICD-10-CM | POA: Diagnosis not present

## 2023-07-18 DIAGNOSIS — I10 Essential (primary) hypertension: Secondary | ICD-10-CM | POA: Diagnosis not present

## 2023-07-18 DIAGNOSIS — G4700 Insomnia, unspecified: Secondary | ICD-10-CM | POA: Diagnosis not present

## 2023-07-18 DIAGNOSIS — I872 Venous insufficiency (chronic) (peripheral): Secondary | ICD-10-CM | POA: Diagnosis not present

## 2023-07-18 DIAGNOSIS — E66812 Obesity, class 2: Secondary | ICD-10-CM | POA: Diagnosis not present

## 2023-07-18 DIAGNOSIS — E1142 Type 2 diabetes mellitus with diabetic polyneuropathy: Secondary | ICD-10-CM | POA: Diagnosis not present

## 2023-07-18 DIAGNOSIS — G479 Sleep disorder, unspecified: Secondary | ICD-10-CM | POA: Diagnosis not present

## 2023-07-18 DIAGNOSIS — N3000 Acute cystitis without hematuria: Secondary | ICD-10-CM | POA: Diagnosis not present

## 2023-07-18 DIAGNOSIS — A419 Sepsis, unspecified organism: Secondary | ICD-10-CM | POA: Diagnosis not present

## 2023-07-18 DIAGNOSIS — F320 Major depressive disorder, single episode, mild: Secondary | ICD-10-CM | POA: Diagnosis not present

## 2023-07-21 DIAGNOSIS — I872 Venous insufficiency (chronic) (peripheral): Secondary | ICD-10-CM | POA: Diagnosis not present

## 2023-07-21 DIAGNOSIS — G479 Sleep disorder, unspecified: Secondary | ICD-10-CM | POA: Diagnosis not present

## 2023-07-21 DIAGNOSIS — F320 Major depressive disorder, single episode, mild: Secondary | ICD-10-CM | POA: Diagnosis not present

## 2023-07-21 DIAGNOSIS — I10 Essential (primary) hypertension: Secondary | ICD-10-CM | POA: Diagnosis not present

## 2023-07-21 DIAGNOSIS — A419 Sepsis, unspecified organism: Secondary | ICD-10-CM | POA: Diagnosis not present

## 2023-07-21 DIAGNOSIS — N3000 Acute cystitis without hematuria: Secondary | ICD-10-CM | POA: Diagnosis not present

## 2023-07-21 DIAGNOSIS — E1142 Type 2 diabetes mellitus with diabetic polyneuropathy: Secondary | ICD-10-CM | POA: Diagnosis not present

## 2023-07-21 DIAGNOSIS — E66812 Obesity, class 2: Secondary | ICD-10-CM | POA: Diagnosis not present

## 2023-07-21 DIAGNOSIS — G4700 Insomnia, unspecified: Secondary | ICD-10-CM | POA: Diagnosis not present

## 2023-07-25 DIAGNOSIS — G479 Sleep disorder, unspecified: Secondary | ICD-10-CM | POA: Diagnosis not present

## 2023-07-25 DIAGNOSIS — N3000 Acute cystitis without hematuria: Secondary | ICD-10-CM | POA: Diagnosis not present

## 2023-07-25 DIAGNOSIS — F320 Major depressive disorder, single episode, mild: Secondary | ICD-10-CM | POA: Diagnosis not present

## 2023-07-25 DIAGNOSIS — A419 Sepsis, unspecified organism: Secondary | ICD-10-CM | POA: Diagnosis not present

## 2023-07-25 DIAGNOSIS — I10 Essential (primary) hypertension: Secondary | ICD-10-CM | POA: Diagnosis not present

## 2023-07-25 DIAGNOSIS — E1142 Type 2 diabetes mellitus with diabetic polyneuropathy: Secondary | ICD-10-CM | POA: Diagnosis not present

## 2023-07-25 DIAGNOSIS — I872 Venous insufficiency (chronic) (peripheral): Secondary | ICD-10-CM | POA: Diagnosis not present

## 2023-07-25 DIAGNOSIS — G4700 Insomnia, unspecified: Secondary | ICD-10-CM | POA: Diagnosis not present

## 2023-07-25 DIAGNOSIS — E66812 Obesity, class 2: Secondary | ICD-10-CM | POA: Diagnosis not present

## 2023-08-01 DIAGNOSIS — I10 Essential (primary) hypertension: Secondary | ICD-10-CM | POA: Diagnosis not present

## 2023-08-01 DIAGNOSIS — E66812 Obesity, class 2: Secondary | ICD-10-CM | POA: Diagnosis not present

## 2023-08-01 DIAGNOSIS — E1142 Type 2 diabetes mellitus with diabetic polyneuropathy: Secondary | ICD-10-CM | POA: Diagnosis not present

## 2023-08-01 DIAGNOSIS — F320 Major depressive disorder, single episode, mild: Secondary | ICD-10-CM | POA: Diagnosis not present

## 2023-08-01 DIAGNOSIS — N3000 Acute cystitis without hematuria: Secondary | ICD-10-CM | POA: Diagnosis not present

## 2023-08-01 DIAGNOSIS — G479 Sleep disorder, unspecified: Secondary | ICD-10-CM | POA: Diagnosis not present

## 2023-08-01 DIAGNOSIS — A419 Sepsis, unspecified organism: Secondary | ICD-10-CM | POA: Diagnosis not present

## 2023-08-01 DIAGNOSIS — I872 Venous insufficiency (chronic) (peripheral): Secondary | ICD-10-CM | POA: Diagnosis not present

## 2023-08-01 DIAGNOSIS — G4700 Insomnia, unspecified: Secondary | ICD-10-CM | POA: Diagnosis not present

## 2023-08-02 DIAGNOSIS — G479 Sleep disorder, unspecified: Secondary | ICD-10-CM | POA: Diagnosis not present

## 2023-08-02 DIAGNOSIS — F320 Major depressive disorder, single episode, mild: Secondary | ICD-10-CM | POA: Diagnosis not present

## 2023-08-02 DIAGNOSIS — N3000 Acute cystitis without hematuria: Secondary | ICD-10-CM | POA: Diagnosis not present

## 2023-08-02 DIAGNOSIS — A419 Sepsis, unspecified organism: Secondary | ICD-10-CM | POA: Diagnosis not present

## 2023-08-02 DIAGNOSIS — E1142 Type 2 diabetes mellitus with diabetic polyneuropathy: Secondary | ICD-10-CM | POA: Diagnosis not present

## 2023-08-02 DIAGNOSIS — E66812 Obesity, class 2: Secondary | ICD-10-CM | POA: Diagnosis not present

## 2023-08-02 DIAGNOSIS — G4700 Insomnia, unspecified: Secondary | ICD-10-CM | POA: Diagnosis not present

## 2023-08-02 DIAGNOSIS — I872 Venous insufficiency (chronic) (peripheral): Secondary | ICD-10-CM | POA: Diagnosis not present

## 2023-08-02 DIAGNOSIS — I10 Essential (primary) hypertension: Secondary | ICD-10-CM | POA: Diagnosis not present

## 2023-08-09 DIAGNOSIS — I872 Venous insufficiency (chronic) (peripheral): Secondary | ICD-10-CM | POA: Diagnosis not present

## 2023-08-09 DIAGNOSIS — E66812 Obesity, class 2: Secondary | ICD-10-CM | POA: Diagnosis not present

## 2023-08-09 DIAGNOSIS — G4700 Insomnia, unspecified: Secondary | ICD-10-CM | POA: Diagnosis not present

## 2023-08-09 DIAGNOSIS — I10 Essential (primary) hypertension: Secondary | ICD-10-CM | POA: Diagnosis not present

## 2023-08-09 DIAGNOSIS — N3000 Acute cystitis without hematuria: Secondary | ICD-10-CM | POA: Diagnosis not present

## 2023-08-09 DIAGNOSIS — G479 Sleep disorder, unspecified: Secondary | ICD-10-CM | POA: Diagnosis not present

## 2023-08-09 DIAGNOSIS — A419 Sepsis, unspecified organism: Secondary | ICD-10-CM | POA: Diagnosis not present

## 2023-08-09 DIAGNOSIS — E1142 Type 2 diabetes mellitus with diabetic polyneuropathy: Secondary | ICD-10-CM | POA: Diagnosis not present

## 2023-08-09 DIAGNOSIS — F320 Major depressive disorder, single episode, mild: Secondary | ICD-10-CM | POA: Diagnosis not present

## 2023-08-31 ENCOUNTER — Telehealth: Payer: Self-pay

## 2023-08-31 NOTE — Progress Notes (Signed)
   08/31/2023  Patient ID: Richard Fields, male   DOB: 1941/04/26, 83 y.o.   MRN: [id number]   At-Risk Adherence Monitoring  Documentation completed in [facility], no outreach needed. Patient is up to date on meds, no fill history gaps for [facility] or [facility]. Will continue monitoring adherence monthly   Flint Hummer, PharmD

## 2023-09-28 ENCOUNTER — Telehealth: Payer: Self-pay

## 2023-09-28 NOTE — Progress Notes (Signed)
   09/28/2023  Patient ID: Richard Fields, male   DOB: 23-Feb-1941, 83 y.o.   MRN: [id number]  Adherence Monitoring:  Up to date on meds, documentation in [facility].   Flint Hummer, PharmD

## 2023-10-26 ENCOUNTER — Telehealth: Payer: Self-pay

## 2023-10-26 NOTE — Telephone Encounter (Signed)
 Up to date on meds, next review in July

## 2023-11-21 ENCOUNTER — Telehealth: Payer: Self-pay

## 2023-11-21 NOTE — Telephone Encounter (Signed)
 Up to date on meds, next review in August

## 2023-12-05 ENCOUNTER — Telehealth: Payer: Self-pay

## 2023-12-05 NOTE — Telephone Encounter (Signed)
 Up to date on meds, next review in September

## 2024-01-31 DIAGNOSIS — G629 Polyneuropathy, unspecified: Secondary | ICD-10-CM | POA: Diagnosis not present

## 2024-01-31 DIAGNOSIS — E1142 Type 2 diabetes mellitus with diabetic polyneuropathy: Secondary | ICD-10-CM | POA: Diagnosis not present

## 2024-04-29 ENCOUNTER — Encounter: Payer: Self-pay | Admitting: *Deleted
